# Patient Record
Sex: Male | Born: 1935 | Race: White | Hispanic: No | Smoking: Never smoker
Health system: Southern US, Community
[De-identification: ages and names within clinical notes are randomized; demographics above are authoritative.]

## PROBLEM LIST (undated history)

## (undated) DIAGNOSIS — I509 Heart failure, unspecified: Secondary | ICD-10-CM

## (undated) DIAGNOSIS — G8929 Other chronic pain: Secondary | ICD-10-CM

## (undated) DIAGNOSIS — I4891 Unspecified atrial fibrillation: Secondary | ICD-10-CM

## (undated) DIAGNOSIS — K219 Gastro-esophageal reflux disease without esophagitis: Secondary | ICD-10-CM

## (undated) DIAGNOSIS — J189 Pneumonia, unspecified organism: Secondary | ICD-10-CM

## (undated) DIAGNOSIS — Z9981 Dependence on supplemental oxygen: Secondary | ICD-10-CM

## (undated) DIAGNOSIS — E039 Hypothyroidism, unspecified: Secondary | ICD-10-CM

## (undated) DIAGNOSIS — I351 Nonrheumatic aortic (valve) insufficiency: Secondary | ICD-10-CM

## (undated) DIAGNOSIS — C4490 Unspecified malignant neoplasm of skin, unspecified: Secondary | ICD-10-CM

## (undated) DIAGNOSIS — I447 Left bundle-branch block, unspecified: Secondary | ICD-10-CM

## (undated) DIAGNOSIS — M545 Low back pain, unspecified: Secondary | ICD-10-CM

## (undated) DIAGNOSIS — R0602 Shortness of breath: Secondary | ICD-10-CM

## (undated) DIAGNOSIS — I2510 Atherosclerotic heart disease of native coronary artery without angina pectoris: Secondary | ICD-10-CM

## (undated) DIAGNOSIS — I4510 Unspecified right bundle-branch block: Secondary | ICD-10-CM

## (undated) DIAGNOSIS — J449 Chronic obstructive pulmonary disease, unspecified: Secondary | ICD-10-CM

## (undated) DIAGNOSIS — R918 Other nonspecific abnormal finding of lung field: Secondary | ICD-10-CM

## (undated) DIAGNOSIS — I340 Nonrheumatic mitral (valve) insufficiency: Secondary | ICD-10-CM

## (undated) DIAGNOSIS — E785 Hyperlipidemia, unspecified: Secondary | ICD-10-CM

## (undated) DIAGNOSIS — M1990 Unspecified osteoarthritis, unspecified site: Secondary | ICD-10-CM

## (undated) DIAGNOSIS — M25519 Pain in unspecified shoulder: Secondary | ICD-10-CM

## (undated) DIAGNOSIS — I10 Essential (primary) hypertension: Secondary | ICD-10-CM

## (undated) DIAGNOSIS — I429 Cardiomyopathy, unspecified: Secondary | ICD-10-CM

## (undated) DIAGNOSIS — Z950 Presence of cardiac pacemaker: Secondary | ICD-10-CM

## (undated) HISTORY — DX: Other nonspecific abnormal finding of lung field: R91.8

## (undated) HISTORY — DX: Atherosclerotic heart disease of native coronary artery without angina pectoris: I25.10

## (undated) HISTORY — DX: Shortness of breath: R06.02

## (undated) HISTORY — PX: INSERT / REPLACE / REMOVE PACEMAKER: SUR710

## (undated) HISTORY — PX: THROAT SURGERY: SHX803

## (undated) HISTORY — DX: Nonrheumatic mitral (valve) insufficiency: I34.0

## (undated) HISTORY — PX: CATARACT EXTRACTION W/ INTRAOCULAR LENS  IMPLANT, BILATERAL: SHX1307

## (undated) HISTORY — DX: Chronic obstructive pulmonary disease, unspecified: J44.9

## (undated) HISTORY — PX: TONSILLECTOMY: SUR1361

## (undated) HISTORY — PX: SHOULDER ARTHROSCOPY: SHX128

## (undated) HISTORY — DX: Nonrheumatic aortic (valve) insufficiency: I35.1

## (undated) HISTORY — DX: Hyperlipidemia, unspecified: E78.5

## (undated) HISTORY — DX: Pain in unspecified shoulder: M25.519

## (undated) HISTORY — PX: CORONARY ANGIOPLASTY: SHX604

## (undated) HISTORY — DX: Essential (primary) hypertension: I10

## (undated) HISTORY — PX: KNEE ARTHROSCOPY: SHX127

## (undated) HISTORY — DX: Left bundle-branch block, unspecified: I44.7

## (undated) HISTORY — PX: BACK SURGERY: SHX140

## (undated) HISTORY — PX: LUMBAR DISC SURGERY: SHX700

---

## 1997-11-14 ENCOUNTER — Encounter: Admission: RE | Admit: 1997-11-14 | Discharge: 1998-02-12 | Payer: Self-pay | Admitting: Orthopedic Surgery

## 1998-03-18 ENCOUNTER — Encounter: Admission: RE | Admit: 1998-03-18 | Discharge: 1998-06-16 | Payer: Self-pay | Admitting: Orthopedic Surgery

## 2001-10-13 ENCOUNTER — Encounter: Admission: RE | Admit: 2001-10-13 | Discharge: 2001-10-13 | Payer: Self-pay | Admitting: Orthopaedic Surgery

## 2001-10-13 ENCOUNTER — Encounter: Payer: Self-pay | Admitting: Orthopaedic Surgery

## 2001-10-25 ENCOUNTER — Encounter: Payer: Self-pay | Admitting: Orthopaedic Surgery

## 2001-10-25 ENCOUNTER — Encounter: Admission: RE | Admit: 2001-10-25 | Discharge: 2001-10-25 | Payer: Self-pay | Admitting: Orthopaedic Surgery

## 2001-11-08 ENCOUNTER — Encounter: Payer: Self-pay | Admitting: Orthopaedic Surgery

## 2001-11-08 ENCOUNTER — Encounter: Admission: RE | Admit: 2001-11-08 | Discharge: 2001-11-08 | Payer: Self-pay | Admitting: Orthopaedic Surgery

## 2001-11-22 ENCOUNTER — Encounter: Admission: RE | Admit: 2001-11-22 | Discharge: 2001-11-22 | Payer: Self-pay | Admitting: Orthopaedic Surgery

## 2001-11-22 ENCOUNTER — Encounter: Payer: Self-pay | Admitting: Orthopaedic Surgery

## 2002-08-01 ENCOUNTER — Encounter: Payer: Self-pay | Admitting: Neurosurgery

## 2002-08-05 ENCOUNTER — Encounter: Payer: Self-pay | Admitting: Neurosurgery

## 2002-08-05 ENCOUNTER — Inpatient Hospital Stay (HOSPITAL_COMMUNITY): Admission: RE | Admit: 2002-08-05 | Discharge: 2002-08-06 | Payer: Self-pay | Admitting: Neurosurgery

## 2003-02-11 ENCOUNTER — Emergency Department (HOSPITAL_COMMUNITY): Admission: EM | Admit: 2003-02-11 | Discharge: 2003-02-11 | Payer: Self-pay | Admitting: *Deleted

## 2003-06-10 ENCOUNTER — Encounter: Payer: Self-pay | Admitting: Gastroenterology

## 2003-10-02 ENCOUNTER — Encounter: Admission: RE | Admit: 2003-10-02 | Discharge: 2003-10-23 | Payer: Self-pay | Admitting: Orthopaedic Surgery

## 2003-11-11 ENCOUNTER — Ambulatory Visit (HOSPITAL_COMMUNITY): Admission: RE | Admit: 2003-11-11 | Discharge: 2003-11-11 | Payer: Self-pay | Admitting: Neurosurgery

## 2004-09-21 ENCOUNTER — Encounter: Admission: RE | Admit: 2004-09-21 | Discharge: 2004-09-21 | Payer: Self-pay | Admitting: Internal Medicine

## 2005-03-11 ENCOUNTER — Encounter: Admission: RE | Admit: 2005-03-11 | Discharge: 2005-03-11 | Payer: Self-pay | Admitting: Internal Medicine

## 2005-04-21 ENCOUNTER — Emergency Department (HOSPITAL_COMMUNITY): Admission: EM | Admit: 2005-04-21 | Discharge: 2005-04-21 | Payer: Self-pay | Admitting: Emergency Medicine

## 2007-03-21 ENCOUNTER — Encounter: Admission: RE | Admit: 2007-03-21 | Discharge: 2007-06-19 | Payer: Self-pay | Admitting: Orthopaedic Surgery

## 2008-08-06 ENCOUNTER — Inpatient Hospital Stay (HOSPITAL_COMMUNITY): Admission: EM | Admit: 2008-08-06 | Discharge: 2008-08-08 | Payer: Self-pay | Admitting: Emergency Medicine

## 2008-08-06 ENCOUNTER — Ambulatory Visit: Payer: Self-pay | Admitting: Cardiology

## 2008-08-07 ENCOUNTER — Encounter (INDEPENDENT_AMBULATORY_CARE_PROVIDER_SITE_OTHER): Payer: Self-pay | Admitting: Cardiology

## 2008-08-12 ENCOUNTER — Telehealth: Payer: Self-pay | Admitting: Cardiology

## 2008-08-13 ENCOUNTER — Ambulatory Visit: Payer: Self-pay

## 2008-08-15 ENCOUNTER — Telehealth: Payer: Self-pay | Admitting: Cardiology

## 2008-08-25 ENCOUNTER — Encounter: Payer: Self-pay | Admitting: Cardiology

## 2008-08-25 ENCOUNTER — Ambulatory Visit: Payer: Self-pay | Admitting: Cardiology

## 2008-09-03 ENCOUNTER — Encounter: Payer: Self-pay | Admitting: Cardiology

## 2008-10-16 ENCOUNTER — Ambulatory Visit: Payer: Self-pay | Admitting: Cardiology

## 2008-11-18 ENCOUNTER — Encounter: Payer: Self-pay | Admitting: Cardiology

## 2008-11-18 ENCOUNTER — Encounter: Admission: RE | Admit: 2008-11-18 | Discharge: 2008-11-18 | Payer: Self-pay | Admitting: Internal Medicine

## 2009-02-03 ENCOUNTER — Telehealth: Payer: Self-pay | Admitting: Cardiology

## 2009-02-04 ENCOUNTER — Ambulatory Visit: Payer: Self-pay | Admitting: Cardiology

## 2009-02-09 ENCOUNTER — Telehealth: Payer: Self-pay | Admitting: Cardiology

## 2009-02-10 ENCOUNTER — Ambulatory Visit: Payer: Self-pay | Admitting: Cardiology

## 2009-02-12 LAB — CONVERTED CEMR LAB
CO2: 25 meq/L (ref 19–32)
Calcium: 8.8 mg/dL (ref 8.4–10.5)
Creatinine, Ser: 1.2 mg/dL (ref 0.4–1.5)
Glucose, Bld: 84 mg/dL (ref 70–99)
Sodium: 142 meq/L (ref 135–145)

## 2009-02-13 ENCOUNTER — Encounter: Payer: Self-pay | Admitting: Cardiology

## 2009-02-17 ENCOUNTER — Ambulatory Visit: Payer: Self-pay | Admitting: Cardiology

## 2009-04-20 ENCOUNTER — Ambulatory Visit: Payer: Self-pay | Admitting: Cardiology

## 2009-04-20 LAB — CONVERTED CEMR LAB
BUN: 21 mg/dL (ref 6–23)
CO2: 23 meq/L (ref 19–32)
Chloride: 107 meq/L (ref 96–112)
Glucose, Bld: 135 mg/dL — ABNORMAL HIGH (ref 70–99)

## 2009-05-05 ENCOUNTER — Ambulatory Visit: Payer: Self-pay | Admitting: Cardiology

## 2009-05-08 LAB — CONVERTED CEMR LAB
BUN: 24 mg/dL — ABNORMAL HIGH (ref 6–23)
Chloride: 110 meq/L (ref 96–112)
GFR calc non Af Amer: 55.12 mL/min (ref >60)
Potassium: 5 meq/L (ref 3.5–5.1)
Sodium: 143 meq/L (ref 135–145)

## 2009-05-11 ENCOUNTER — Inpatient Hospital Stay (HOSPITAL_COMMUNITY): Admission: EM | Admit: 2009-05-11 | Discharge: 2009-05-15 | Payer: Self-pay | Admitting: Emergency Medicine

## 2009-06-12 ENCOUNTER — Telehealth: Payer: Self-pay | Admitting: Gastroenterology

## 2009-06-12 ENCOUNTER — Encounter: Payer: Self-pay | Admitting: Gastroenterology

## 2009-06-15 ENCOUNTER — Ambulatory Visit: Payer: Self-pay | Admitting: Gastroenterology

## 2009-06-16 ENCOUNTER — Ambulatory Visit: Payer: Self-pay | Admitting: Gastroenterology

## 2009-06-17 ENCOUNTER — Telehealth: Payer: Self-pay | Admitting: Gastroenterology

## 2009-06-18 ENCOUNTER — Encounter: Admission: RE | Admit: 2009-06-18 | Discharge: 2009-06-18 | Payer: Self-pay | Admitting: Internal Medicine

## 2009-06-19 ENCOUNTER — Encounter: Payer: Self-pay | Admitting: Gastroenterology

## 2009-06-26 ENCOUNTER — Ambulatory Visit: Payer: Self-pay | Admitting: Pulmonary Disease

## 2009-06-29 ENCOUNTER — Encounter: Payer: Self-pay | Admitting: Cardiology

## 2009-06-30 ENCOUNTER — Ambulatory Visit: Payer: Self-pay | Admitting: Cardiology

## 2009-07-06 LAB — CONVERTED CEMR LAB
Fecal Occult Blood: NEGATIVE
OCCULT 1: NEGATIVE
OCCULT 4: NEGATIVE
OCCULT 5: NEGATIVE

## 2009-10-01 ENCOUNTER — Encounter: Payer: Self-pay | Admitting: Cardiology

## 2009-10-02 ENCOUNTER — Inpatient Hospital Stay (HOSPITAL_COMMUNITY): Admission: EM | Admit: 2009-10-02 | Discharge: 2009-10-05 | Payer: Self-pay | Admitting: Emergency Medicine

## 2009-10-12 ENCOUNTER — Ambulatory Visit: Payer: Self-pay | Admitting: Cardiology

## 2009-10-12 ENCOUNTER — Telehealth: Payer: Self-pay | Admitting: Cardiology

## 2009-10-12 LAB — CONVERTED CEMR LAB
BUN: 24 mg/dL — ABNORMAL HIGH (ref 6–23)
Basophils Absolute: 0 10*3/uL (ref 0.0–0.1)
Chloride: 110 meq/L (ref 96–112)
Creatinine, Ser: 1.5 mg/dL (ref 0.4–1.5)
Eosinophils Absolute: 0.1 10*3/uL (ref 0.0–0.7)
Eosinophils Relative: 1.3 % (ref 0.0–5.0)
MCHC: 34.3 g/dL (ref 30.0–36.0)
MCV: 93.7 fL (ref 78.0–100.0)
Neutrophils Relative %: 58 % (ref 43.0–77.0)
Platelets: 472 10*3/uL — ABNORMAL HIGH (ref 150.0–400.0)
WBC: 9.1 10*3/uL (ref 4.5–10.5)

## 2009-10-14 ENCOUNTER — Ambulatory Visit: Payer: Self-pay | Admitting: Cardiology

## 2009-11-10 ENCOUNTER — Encounter: Admission: RE | Admit: 2009-11-10 | Discharge: 2009-11-10 | Payer: Self-pay | Admitting: Internal Medicine

## 2010-03-18 ENCOUNTER — Ambulatory Visit: Payer: Self-pay | Admitting: Cardiology

## 2010-05-11 ENCOUNTER — Telehealth: Payer: Self-pay | Admitting: Cardiology

## 2010-05-12 ENCOUNTER — Telehealth (INDEPENDENT_AMBULATORY_CARE_PROVIDER_SITE_OTHER): Payer: Self-pay | Admitting: *Deleted

## 2010-05-20 NOTE — Progress Notes (Signed)
Summary: swollen feet/SOB  Phone Note Call from Patient Call back at [phone]   Caller: Daughter/Cynthia Summary of Call: both feet are swollen, sob, just dc from hospital Initial call taken by: Migdalia Dk,  October 12, 2009 9:28 AM  Follow-up for Phone Call        spoke w/Cynthia she states pts feet are swollen bilaterally from ankle down, right is bigger than the left, he feels like he can't take a good deep breathe, he was unable to lay flat last night and had to sleep in his recliner, pt was just d/c from hospital last week w/pna and a gi virus, he is on furosemide 40mg  daily, will discuss w/Dr Myrtis Ser and call back Meredith Staggers, RN  October 12, 2009 10:11 AM   Additional Follow-up for Phone Call Additional follow up Details #1::        discussed w/Dr Myrtis Ser will bring pt in for an appt today, pt sch for 11am Meredith Staggers, RN  October 12, 2009 10:25 AM

## 2010-05-20 NOTE — Letter (Signed)
Summary: Nila Nephew  MD  Nila Nephew  MD   Imported By: Lester Bellwood 06/17/2009 11:26:26  _____________________________________________________________________  External Attachment:    Type:   Image     Comment:   External Document

## 2010-05-20 NOTE — Letter (Signed)
Summary: Results Letter  [facility]  [address] [place], [place] [address]   Phone: [phone]  Fax: [phone]        June 15, 2009 MRN: [id number]    NIVAAN DICENZO [address] [place], [place]  [address]    Dear Mr. Delaine,  It is my pleasure to have treated you recently as a new patient in my office. I appreciate your confidence and the opportunity to participate in your care.  Since I do have a busy inpatient endoscopy schedule and office schedule, my office hours vary weekly. I am, however, available for emergency calls everyday through my office. If I am not available for an urgent office appointment, another one of our gastroenterologist will be able to assist you.  My well-trained staff are prepared to help you at all times. For emergencies after office hours, a physician from our Gastroenterology section is always available through my 24 hour answering service  Once again I welcome you as a new patient and I look forward to a happy and healthy relationship             Sincerely,  Louis Meckel MD  This letter has been electronically signed by your physician.  Appended Document: Results Letter letter mailed

## 2010-05-20 NOTE — Assessment & Plan Note (Signed)
Summary: 8wk f/u sl   Visit Type:  Follow-up Referring Provider:  Elmore Guise, MD Primary Provider:  Elmore Guise, MD  CC:  palpitations.  History of Present Illness: The patient is feeling well today.  After telling me of his hospital admission with pneumonia, I have pulled the records and reviewed the data.  He was admitted with cough and left-sided chest pain.  Chest x-ray showed no acute findings.  Nuclear scan revealed low probability for pulmonary embolus.  CT Scan of the abdomen and pelvis actually showed dense lower lobe air space disease consistent with pneumonia.  A followup chest x-ray revealed a pneumonia area he was treated with IV and antibiotics.  He stabilized and was discharged home.  Today he is feeling well.  He is not having any chest pain or shortness of breath.  He did not have any palpitations.  Current Medications (verified): 1)  Toprol Xl 50 Mg Xr24h-Tab (Metoprolol Succinate) .... Take One Tablet By Mouth Once Daily. 2)  Tricor 48 Mg Tabs (Fenofibrate) .... Take One-Half Tablet By Mouth At Bedtime 3)  Aspirin 81 Mg  Tabs (Aspirin) .... Take One Tablet By Mouth Once Daily. 4)  Ocuvite Preservision  Tabs (Multiple Vitamins-Minerals) .... Take One Tablet By Mouth Two Times A Day 5)  Omeprazole 20 Mg Cpdr (Omeprazole) .... Take One Tablet By Mouth Once Daily. 6)  Centrum Silver  Tabs (Multiple Vitamins-Minerals) .... Take One Tablet By Mouth Once Daily. 7)  Stool Softener 100 Mg Caps (Docusate Sodium) .Marland Kitchen.. 1 Daily As Needed 8)  Fish Oil 1000 Mg Caps (Omega-3 Fatty Acids) .... Take Two By Mouth Once Daily 9)  Furosemide 40 Mg Tabs (Furosemide) .... Take One Tablet By Mouth Daily.  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 August, 2010 COPD Nuclear stress study August 08, 2008 possible small reversible ischemic area in the mid and apical segment of the anteroseptal wall Hypertension Gout Elevated triglycerides Palpitations..Discharge weight presumed to be paroxysmal atrial fibrillation but no definite documentation. Systolic murmur Smothering sensation-hospitalization August 06, 2008-question mild volume component-question bradycardia tachycardia component EP evaluation-Dr. Allred....-assess status with outpatient event recorder-April 2010-no proven chronotropic incompetence  Mild MR ..  echo.Marland Kitchen.07/2008 Mild AI.Marland Kitchen..echo.Marland Kitchen.07/2008 Pneumonia... hospitalization.... May 11, 2009  Review of Systems       Patient denies fever, chills, headache, sweats, rash, change in vision, change in hearing, chest pain, shortness of breath, nausea or vomiting, urinary symptoms.  All other systems are reviewed and are negative.  Vital Signs:  Patient profile:   77 year old male Height:      71 inches Weight:      183 pounds Pulse rate:   85 / minute BP sitting:   122 / 66  (left arm) Cuff size:   regular  Vitals Entered By: Hardin Negus, RMA (June 30, 2009 9:32 AM)  Physical Exam  General:  patient is stable today. Eyes:  no xanthelasma. Neck:  no jugular venous distention. Lungs:  lungs are clear respiratory effort is nonlabored. Heart:  cardiac exam reveals no dullness to peritoneal clicks or significant murmurs. Abdomen:  abdomen is soft. Extremities:  no peripheral edema. Psych:  patient is oriented to person time and place.  Affect is normal.   Impression & Recommendations:  Problem #  1:  SHORTNESS OF BREATH (ICD-786.05) patient has no shortness of breath at this time.  He is recovered completely from his pneumonia.  Problem # 2:  PALPITATIONS (ICD-785.1) patient is not having any significant palpitations.  No change in therapy.  Problem # 3:  ESSENTIAL HYPERTENSION, BENIGN  (ICD-401.1) Blood pressure is controlled.  No change in therapy.  Problem # 4:  * EJECTION FRACTION 45-50% No change in therapy is needed.  I will see the patient back in one year for followup.  Patient Instructions: 1)  Follow up in 1 year Prescriptions: FUROSEMIDE 40 MG TABS (FUROSEMIDE) Take one tablet by mouth daily.  #90 x 3   Entered by:   Meredith Staggers, RN   Authorized by:   Talitha Givens, MD, [facility]   Signed by:   Meredith Staggers, RN on 06/30/2009   Method used:   [facility] to        [facility] [address] [phone]* (retail)       [address]       [place], [place]  [address]       Ph: [phone] or [phone]       Fax: [phone]   RxID:   [id number] TOPROL XL 50 MG XR24H-TAB (METOPROLOL SUCCINATE) Take one tablet by mouth once daily.  #90 x 3   Entered by:   Meredith Staggers, RN   Authorized by:   Talitha Givens, MD, [facility]   Signed by:   Meredith Staggers, RN on 06/30/2009   Method used:   [facility]        [facility] [address] [phone]* (retail)       [address]       [place], [place]  [address]       Ph: [phone] or [phone]       Fax: [phone]   RxID:   [id number]

## 2010-05-20 NOTE — Miscellaneous (Signed)
  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 August, 2010 COPD Nuclear stress study August 08, 2008 possible small reversible ischemic area in the mid and apical segment of the anteroseptal wall Hypertension Gout Elevated triglycerides Palpitations..Discharge weight presumed to be paroxysmal atrial fibrillation but no definite documentation. Systolic murmur Smothering sensation-hospitalization August 06, 2008-question mild volume component-question bradycardia tachycardia component EP evaluation-Dr. Allred....-assess status with outpatient event recorder-April 2010-no proven chronotropic incompetence  Mild MR ..  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 August, 2010 COPD Nuclear stress study August 08, 2008 possible small reversible ischemic area in the mid and apical segment of the anteroseptal wall Hypertension Gout Elevated triglycerides Palpitations..Discharge weight presumed to be paroxysmal atrial fibrillation but no definite documentation. Systolic murmur Smothering sensation-hospitalization August 06, 2008-question mild volume component-question bradycardia tachycardia component EP evaluation-Dr. Allred....-assess status with outpatient event recorder-April 2010-no proven chronotropic incompetence  Mild MR ..  echo.Marland Kitchen.07/2008 Mild AI.Marland Kitchen..echo.Marland Kitchen.07/2008

## 2010-05-20 NOTE — Assessment & Plan Note (Signed)
Summary: HEM. POSITIVE STOOLS                   DEBORAH   History of Present Illness Visit Type: Initial Consult Primary GI MD: Melvia Heaps MD [facility] Primary Provider: Elmore Guise, MD Requesting Provider: Elmore Guise, MD Chief Complaint: Patient complains of black tarry stools for about 10 days, he also states that he has had some diarrhea. Dr. Amanda Cockayne office found the patient t to be hem pos.  History of Present Illness:   Levi Lewis is a pleasant 77 year old white male referred at the  request of  Dr. Chilton Si for evaluation of Hemoccult-positive stools.  He noted several black soft stools recently and tested Hemoccult positive at Dr. Thomasene Lot office.  He has noted some change in his bowel habits consisting of urgency.  He has no frank rectal bleeding.  He is on no gastric irritants including nonsteroidals.   GI Review of Systems      Denies abdominal pain, acid reflux, belching, bloating, chest pain, dysphagia with liquids, dysphagia with solids, heartburn, loss of appetite, nausea, vomiting, vomiting blood, weight loss, and  weight gain.      Reports black tarry stools, diarrhea, heme positive stool, and  hemorrhoids.     Denies anal fissure, change in bowel habit, constipation, diverticulosis, fecal incontinence, irritable bowel syndrome, jaundice, light color stool, liver problems, rectal bleeding, and  rectal pain. Preventive Screening-Counseling & Management      Drug Use:  no.      Current Medications (verified): 1)  Toprol Xl 50 Mg Xr24h-Tab (Metoprolol Succinate) .... Take One Tablet By Mouth Once Daily. 2)  Tricor 48 Mg Tabs (Fenofibrate) .... Take One-Half Tablet By Mouth At Bedtime 3)  Aspirin 81 Mg  Tabs (Aspirin) .... Take One Tablet By Mouth Once Daily. 4)  Ocuvite Preservision  Tabs (Multiple Vitamins-Minerals) .... Take One Tablet By Mouth Two Times A Day 5)  Omeprazole 20 Mg Cpdr (Omeprazole) .... Take One Tablet By Mouth Once Daily. 6)  Centrum Silver  Tabs (Multiple  Vitamins-Minerals) .... Take One Tablet By Mouth Once Daily. 7)  Stool Softener 100 Mg Caps (Docusate Sodium) .Marland Kitchen.. 1 Daily As Needed 8)  Fish Oil 1000 Mg Caps (Omega-3 Fatty Acids) .... Take Two By Mouth Once Daily 9)  Furosemide 40 Mg Tabs (Furosemide) .... Take One Tablet By Mouth Daily.  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 August, 2010 COPD Nuclear stress study August 08, 2008 possible small reversible ischemic area in the mid and apical segment of the anteroseptal wall Hypertension Gout Elevated triglycerides Palpitations..Discharge weight presumed to be paroxysmal atrial fibrillation but no definite documentation. Systolic murmur Smothering sensation-hospitalization August 06, 2008-question mild volume component-question bradycardia tachycardia component EP evaluation-Dr. Rush Landmark..-assess status with outpatient event recorder-April 2010-no proven chronotropic incompetence   Past Surgical History: back surgery right shoulder surgery right knee surgery Tonsillectomy  Family History:  Sister passed away due to complications of a myocardial   infarction in her 61s.  Family History of Lung Cancer: Brother (smoker)   No FH of Colon Cancer:  Social History: He has never smoked or drank.  No illicit drug use.  He   is retired from the [employer] and lives with his wife.  Illicit Drug Use - no Drug Use:  no  Review of Systems  The patient denies allergy/sinus, anemia, anxiety-new, arthritis/joint pain, back pain, blood in urine, breast changes/lumps, change in vision, confusion, cough, coughing up blood, depression-new, fainting, fatigue, fever, headaches-new, hearing problems, heart murmur, heart  rhythm changes, itching, menstrual pain, muscle pains/cramps, night sweats, nosebleeds, pregnancy symptoms, shortness of breath, skin rash, sleeping problems, sore throat, swelling of feet/legs, swollen lymph glands, thirst - excessive , urination - excessive , urination changes/pain, urine leakage, vision changes, and voice change.    Vital Signs:  Patient profile:   77 year old male Height:      71 inches Weight:      185.4 pounds Pulse rate:   74 / minute Pulse rhythm:   irregularly irregular BP sitting:   124 / 62  (left arm) Cuff size:   regular  Vitals Entered By: Harlow Mares CMA [name]) (June 15, 2009 9:39 AM)  Physical Exam  Additional Exam:  On physical exam he is an elderly male in no acute distress  skin: anicteric HEENT: normocephalic; PEERLA; no nasal or pharyngeal abnormalities neck: supple nodes: no cervical lymphadenopathy chest: clear to ausculatation and percussion heart: no murmurs, gallops, or rubs abd: soft, nontender; BS normoactive; no abdominal masses, tenderness, organomegaly rectal: deferred ext: no cynanosis, clubbing, edema skeletal: no deformities neuro: oriented x 3; no focal abnormalities    Impression & Recommendations:  Problem # 1:  FECAL OCCULT BLOOD (ICD-792.1)  Patient has evidence for GI blood loss.  A colonic lesion including polyps, AVMs and neoplasm are considerations.  Upper GI source must also be considered though the patient is taking omeprazole.  Recommendations #1 colonoscopy #2 upper endoscopy if colonoscopy is not diagnostic for a GI bleeding source  Risks, alternatives, and complications of the procedure, including bleeding, perforation, and possible need for surgery, were explained to the patient.  Patient's questions were answered.  Orders: Colonoscopy (Colon)  Problem # 2:  ESSENTIAL HYPERTENSION, BENIGN (ICD-401.1) Assessment: Comment Only  Problem # 3:  LBBB (ICD-426.3) Assessment: Comment Only  Patient  Instructions: 1)  Colonoscopy and Flexible Sigmoidoscopy brochure given.  2)  Conscious Sedation brochure given.  3)  Your Colonoscopy is scheduled for 06/16/2009 at 3pm 4)  You can pick up your MoviPrep from your pharmacy today 5)  cc Dr. Chilton Si 6)  The medication list was reviewed and reconciled.  All changed / newly prescribed medications were explained.  A complete medication list was provided to the patient / caregiver. Prescriptions: MOVIPREP 100 GM  SOLR (PEG-KCL-NACL-NASULF-NA ASC-C) As per prep instructions.  #1 x 0   Entered by:   Merri Ray CMA (AAMA)   Authorized by:   Louis Meckel MD   Signed by:   Merri Ray CMA (AAMA) on 06/15/2009   Method used:   [facility]        [facility] [address] [phone]* (retail)       [address]       [place], [place]  [address]       Ph: [phone] or [phone]       Fax: [phone]   RxID:   [id number]

## 2010-05-20 NOTE — Progress Notes (Signed)
Summary: speak to nurse  Phone Note Call from Patient Call back at [phone]   Caller: Daughter in law Aram Beecham Call For: Arlyce Dice Reason for Call: Talk to Nurse Summary of Call: Daughter in law states that the patient was suppose to get a list of high fiber diet and she also states that he needed to get suppositories but she does not know what kind or which one to get. Initial call taken by: Tawni Levy,  June 17, 2009 9:07 AM  Follow-up for Phone Call        Attempted to return pts phone call with no answer.  Will try again later. Follow-up by: Jennye Boroughs RN,  June 17, 2009 10:42 AM  Additional Follow-up for Phone Call Additional follow up Details #1::        Spoke with daughter- she feels that the high fiber diet was left at our facility by the pt's wife-will mail them a copy- the suppositories that she questiopns was OTC and anusol sounded familiar. Caller was very appreciative of the information. Additional Follow-up by: Jennye Boroughs RN,  June 17, 2009 11:01 AM

## 2010-05-20 NOTE — Letter (Signed)
Summary: Dr. Elveria Rising Office  Dr. Josefina Do Green's Office   Imported By: Marylou Mccoy 11/18/2009 14:59:26  _____________________________________________________________________  External Attachment:    Type:   Image     Comment:   External Document

## 2010-05-20 NOTE — Letter (Signed)
Summary: [facility] Instructions  [facility]  [address] [place], [place] [address]   Phone: [phone]  Fax: [phone]       Levi Lewis    1917/07/04    MRN: [id number]        Procedure Day /Date:TUESDAY 06/16/2009     Arrival Time:2:00PM     Procedure Time:3:00PM     Location of Procedure:                    X   [facility] (4th Floor)   PREPARATION FOR COLONOSCOPY WITH MOVIPREP   Starting 5 days prior to your procedure TODAY do not eat nuts, seeds, popcorn, corn, beans, peas,  salads, or any raw vegetables.  Do not take any fiber supplements (e.g. Metamucil, Citrucel, and Benefiber).  THE DAY BEFORE YOUR PROCEDURE         DATE:06/15/2009  DAY: MONDAY  1.  Drink clear liquids the entire day-NO SOLID FOOD  2.  Do not drink anything colored red or purple.  Avoid juices with pulp.  No orange juice.  3.  Drink at least 64 oz. (8 glasses) of fluid/clear liquids during the day to prevent dehydration and help the prep work efficiently.  CLEAR LIQUIDS INCLUDE: Water Jello Ice Popsicles Tea (sugar ok, no milk/cream) Powdered fruit flavored drinks Coffee (sugar ok, no milk/cream) Gatorade Juice: apple, white grape, white cranberry  Lemonade Clear bullion, consomm, broth Carbonated beverages (any kind) Strained chicken noodle soup Hard Candy                             4.  In the morning, mix first dose of MoviPrep solution:    Empty 1 Pouch A and 1 Pouch B into the disposable container    Add lukewarm drinking water to the top line of the container. Mix to dissolve    Refrigerate (mixed solution should be used within 24 hrs)  5.  Begin drinking the prep at 5:00 p.m. The MoviPrep container is divided by 4 marks.   Every 15 minutes drink the solution down to the next mark (approximately 8 oz) until the full liter is complete.   6.  Follow completed prep with 16 oz of clear liquid of your choice (Nothing red or purple).  Continue to  drink clear liquids until bedtime.  7.  Before going to bed, mix second dose of MoviPrep solution:    Empty 1 Pouch A and 1 Pouch B into the disposable container    Add lukewarm drinking water to the top line of the container. Mix to dissolve    Refrigerate  THE DAY OF YOUR PROCEDURE      DATE: 06/16/2009 DAY: TUESDAY  Beginning at10a.m. (5 hours before procedure):         1. Every 15 minutes, drink the solution down to the next mark (approx 8 oz) until the full liter is complete.  2. Follow completed prep with 16 oz. of clear liquid of your choice.    3. You may drink clear liquids until 1:00PM(2 HOURS BEFORE PROCEDURE).   MEDICATION INSTRUCTIONS  Unless otherwise instructed, you should take regular prescription medications with a small sip of water   as early as possible the morning of your procedure.        OTHER INSTRUCTIONS  You will need a responsible adult at least 75 years of age to accompany you and drive you home.  This person must remain in the waiting room during your procedure.  Wear loose fitting clothing that is easily removed.  Leave jewelry and other valuables at home.  However, you may wish to bring a book to read or  an iPod/MP3 player to listen to music as you wait for your procedure to start.  Remove all body piercing jewelry and leave at home.  Total time from sign-in until discharge is approximately 2-3 hours.  You should go home directly after your procedure and rest.  You can resume normal activities the  day after your procedure.  The day of your procedure you should not:   Drive   Make legal decisions   Operate machinery   Drink alcohol   Return to work  You will receive specific instructions about eating, activities and medications before you leave.    The above instructions have been reviewed and explained to me by   _______________________    I fully understand and can verbalize these instructions  _____________________________ Date _________

## 2010-05-20 NOTE — Progress Notes (Signed)
  Walk in Patient Form Recieved " Pt need Surgical Clearance paper left from Sports Medicine" sent to Rusty [facility]  May 12, 2010 1:41 PM

## 2010-05-20 NOTE — Procedures (Signed)
Summary: Colonoscopy  Patient: Levi Lewis Note: All result statuses are Final unless otherwise noted.  Tests: (1) Colonoscopy (COL)   COL Colonoscopy           DONE     [facility]     [address]     [place], [place]  [address]           COLONOSCOPY PROCEDURE REPORT           PATIENT:  Levi Lewis, Levi Lewis  MR#:  [id number]     BIRTHDATE:  12/31/1917, 89 yrs. old  GENDER:  male           ENDOSCOPIST:  Barbette Hair. Arlyce Dice, MD     Referred by:           PROCEDURE DATE:  06/16/2009     PROCEDURE:  Colonoscopy with snare polypectomy     ASA CLASS:  Class II     INDICATIONS:  heme positive stool           MEDICATIONS:   Fentanyl 50 mcg IV, Versed 4 mg IV           DESCRIPTION OF PROCEDURE:   After the risks benefits and     alternatives of the procedure were thoroughly explained, informed     consent was obtained.  Digital rectal exam was performed and     revealed no abnormalities.   The LB [id number] endoscope     was introduced through the anus and advanced to the cecum, which     was identified by both the appendix and ileocecal valve, without     limitations.  The quality of the prep was excellent, using     MoviPrep.  The instrument was then slowly withdrawn as the colon     was fully examined.     <<PROCEDUREIMAGES>>           FINDINGS:  A pedunculated polyp was found in the sigmoid colon. It     was 1.5 cm in size. It was found 25 cm from the point of entry.     Friable polyp Polyp was snared, then cauterized with monopolar     cautery. Retrieval was successful (see image15). snare polyp     Moderate diverticulosis was found in the sigmoid colon (see image14     and image13).  This was otherwise a normal examination of the     colon (see image2, image3, image4, image5, image6, image7,     image10, and image17).   Retroflexed views in the rectum revealed     no abnormalities.    The scope was then withdrawn from the patient     and the procedure completed.         COMPLICATIONS:  None           ENDOSCOPIC IMPRESSION:     1) 1.5 cm pedunculated polyp in the sigmoid colon - probable     source for heme positive stool     2) Moderate diverticulosis in the sigmoid colon     3) Otherwise normal examination     RECOMMENDATIONS:     1) followup hemeoccults in 1 week; if negative no further GI w/u                 REPEAT EXAM:  No, in view of patient's age           ______________________________     Barbette Hair. Arlyce Dice, MD  CC:  Nila Nephew, MD           n.     Rosalie DoctorBarbette Hair. Kaplan at 06/16/2009 03:56 PM           Lubertha Sayres, [id number]  Note: An exclamation mark (!) indicates a result that was not dispersed into the flowsheet. Document Creation Date: 06/16/2009 3:56 PM _______________________________________________________________________  (1) Order result status: Final Collection or observation date-time: 06/16/2009 15:44 Requested date-time:  Receipt date-time:  Reported date-time:  Referring Physician:   Ordering Physician: Melvia Heaps [phone]) Specimen Source:  Source: Launa Grill Order Number: [id number] Lab site:

## 2010-05-20 NOTE — Assessment & Plan Note (Signed)
Summary: 2 month rov/sl   Visit Type:  Follow-up Primary Provider:  Elmore Guise, MD  CC:  palpitations.  History of Present Illness: The patient is actually doing quite well.  He mentions some palpitations.  I do not think that he is having any prolonged arrhythmias.  With careful questioning he does have some exertional shortness of breath.  In addition he may have some PND.  Is not having any significant edema.  There is no chest pain.  Current Medications (verified): 1)  Toprol Xl 50 Mg Xr24h-Tab (Metoprolol Succinate) .... Take One Tablet By Mouth Once Daily. 2)  Tricor 48 Mg Tabs (Fenofibrate) .... Take One-Half Tablet By Mouth At Bedtime 3)  Aspirin 81 Mg  Tabs (Aspirin) .... Take One Tablet By Mouth Once Daily. 4)  Ocuvite Preservision  Tabs (Multiple Vitamins-Minerals) .... Take One Tablet By Mouth Two Times A Day 5)  Omeprazole 20 Mg Cpdr (Omeprazole) .... Take One Tablet By Mouth Once Daily. 6)  Centrum Silver  Tabs (Multiple Vitamins-Minerals) .... Take One Tablet By Mouth Once Daily. 7)  Stool Softener 100 Mg Caps (Docusate Sodium) .Marland Kitchen.. 1 Daily As Needed 8)  Fish Oil   Oil (Fish Oil) .... 1000mg  2 Daily 9)  Lasix 20 Mg Tabs (Furosemide) .... Take One Tablet By Mouth Daily 10)  K-Tabs 10 Meq Cr-Tabs (Potassium Chloride) .... Take One Table By Mouth Daily  Allergies (verified): No Known Drug Allergies  Past History:  Past Medical History: Last updated: 02/10/2009 Left bundle branch block EF 45-50% septal dyssynergy related to LBBB Coronary artery calcification by CT scan Nodular lung opacities chest CT August 06, 2008 / follow up CT scan showed  opacities smaller.. August, 2010 COPD Nuclear stress study August 08, 2008 possible small reversible ischemic area in the mid and apical segment of the anteroseptal wall Hypertension Gout Elevated triglycerides Palpitations..Discharge weight presumed to be paroxysmal atrial fibrillation but no definite documentation. Systolic  murmur Smothering sensation-hospitalization August 06, 2008-question mild volume component-question bradycardia tachycardia component EP evaluation-Dr. Rush Landmark..-assess status with outpatient event recorder-April 2010-no proven chronotropic incompetence   Review of Systems       Patient denies fever, chills, sweats, rash.  He does have mild headaches on the left side of his head which are limited.  He has no change in vision or hearing.  There is no chest pain or cough.  He has no urinary or GI symptoms.  All other systems are reviewed and are negative.  Vital Signs:  Patient profile:   77 year old male Height:      71 inches Weight:      185 pounds Pulse rate:   87 / minute BP sitting:   156 / 79  (left arm)  Vitals Entered By: Oswald Hillock (April 20, 2009 8:49 AM)  Physical Exam  General:  patient is stable today. Head:  head is atraumatic. Eyes:  no xanthelasma. Neck:  no jugular venous distention.  No carotid bruits. Chest Wall:  no chest wall tenderness. Lungs:  lungs reveal a few scattered rhonchi.  There is no respiratory distress. Heart:  cardiac exam reveals S1 and S2.  There is a soft systolic murmur. Abdomen:  abdomen is soft. Msk:  no musculoskeletal deformities. Extremities:  trace peripheral edema. Skin:  no skin rashes. Psych:  patient is oriented to person time and place.  Affect is normal.   Impression & Recommendations:  Problem # 1:  * SMOTHERING SENSATION The patient does not describe a smothering sensation  today, but with careful questioning I believe that he does have some shortness of breath and possibly some PND.  I suspect this is more of his symptomatology than the actual palpitations.  Chemistry will be checked.  Lasix will be increased from 20-40 mg daily.  All see him back for early followup.  Problem # 2:  PALPITATIONS (ICD-785.1)  The patient does complain of some palpitations.  From the prior notes and today's review I am not inclined  to place a monitor.  We will see how he does first by controlling his fluid status a little better.  Orders: EKG w/ Interpretation (93000)  Problem # 3:  ESSENTIAL HYPERTENSION, BENIGN (ICD-401.1) Blood pressure is slightly high and I would like to see today.  I am changing only his diuretic dose today.  Other Orders: TLB-BMP (Basic Metabolic Panel-BMET) (80048-METABOL)  Patient Instructions: 1)  Increase Furosemide to 40mg  daily 2)  Labs today 3)  Follow up in 2 weeks--Tue Jan 18 at 10:30am Prescriptions: FUROSEMIDE 40 MG TABS (FUROSEMIDE) Take one tablet by mouth daily.  #90 x 3   Entered by:   Meredith Staggers, RN   Authorized by:   Talitha Givens, MD, [facility]   Signed by:   Meredith Staggers, RN on 04/20/2009   Method used:   Electronically to        [facility]  [phone]* (retail)       [address]       [place], [place]  [address]       Ph: [phone] or [phone]       Fax: [phone]   RxID:   [id number]

## 2010-05-20 NOTE — Procedures (Signed)
Summary: COLONOSCOPY    Patient Name: Cobi, Delph MRN:  Procedure Procedures: Colorectal cancer screening, average risk CPT: G0121.  Personnel: Endoscopist: Barbette Hair. Arlyce Dice, MD.  Indications  Average Risk Screening Routine.  History  Current Medications: Patient is not currently taking Coumadin.  Pre-Exam Physical: Performed Jun 10, 2003. Entire physical exam was normal.  Exam Exam: Extent of exam reached: Cecum, extent intended: Cecum.  The cecum was identified by IC valve. Colon retroflexion performed. ASA Classification: II. Tolerance: good.  Monitoring: Pulse and BP monitoring, Oximetry used. Supplemental O2 given. at 2 Liters.  Colon Prep Used Miralax for colon prep. Prep results: good.  Sedation Meds: Fentanyl 125 mcg. given IV. Versed 12 given IV.  Findings - DIVERTICULOSIS: Descending Colon to Sigmoid Colon. ICD9: Diverticulosis: 562.10. Comments: Diffuse diverticular changes.  NORMAL EXAM: Cecum.  NORMAL EXAM: Rectum.   Assessment Abnormal examination, see findings above.  Diagnoses: 562.10: Diverticulosis.   Events  Unplanned Interventions: No intervention was required.  Unplanned Events: There were no complications. Plans Patient Education: Patient given standard instructions for: Diverticulosis.  Scheduling/Referral: Follow-Up prn.    CC: Dorma Russell J.Green,MD  This report was created from the original endoscopy report, which was reviewed and signed by the above listed endoscopist.

## 2010-05-20 NOTE — Assessment & Plan Note (Signed)
Summary: Levi Lewis   Visit Type:  Follow-up Primary Provider:  Elmore Guise, MD  CC:  palpitations.  History of Present Illness: The patient is seen today for followup of his palpitations and volume status.  At the time of his last visit his potassium is 3.9 and his renal function was stable with creatinine 1.2.  At that time his potassium was continued at 10 mEq daily and Lasix was increased from 20-40 mg daily.  I believe that his smothering sensations have decreased somewhat.  I believe he is probably having less PND.  Palpitations appear to be the same.  We know from prior studies that he has not had any significant ventricular arrhythmias.  He is not having any significant chest pain.  Current Medications (verified): 1)  Toprol Xl 50 Mg Xr24h-Tab (Metoprolol Succinate) .... Take One Tablet By Mouth Once Daily. 2)  Tricor 48 Mg Tabs (Fenofibrate) .... Take One-Half Tablet By Mouth At Bedtime 3)  Aspirin 81 Mg  Tabs (Aspirin) .... Take One Tablet By Mouth Once Daily. 4)  Ocuvite Preservision  Tabs (Multiple Vitamins-Minerals) .... Take One Tablet By Mouth Two Times A Day 5)  Omeprazole 20 Mg Cpdr (Omeprazole) .... Take One Tablet By Mouth Once Daily. 6)  Centrum Silver  Tabs (Multiple Vitamins-Minerals) .... Take One Tablet By Mouth Once Daily. 7)  Stool Softener 100 Mg Caps (Docusate Sodium) .Marland Kitchen.. 1 Daily As Needed 8)  Fish Oil   Oil (Fish Oil) .... 1000mg  2 Daily 9)  Furosemide 40 Mg Tabs (Furosemide) .... Take One Tablet By Mouth Daily. 10)  K-Tabs 10 Meq Cr-Tabs (Potassium Chloride) .... Take One Table By Mouth Daily  Allergies (verified): No Known Drug Allergies  Past History:  Past Medical History: Last updated: 02/10/2009 Left bundle branch block EF 45-50% septal dyssynergy related to LBBB Coronary artery calcification by CT scan Nodular lung opacities chest CT August 06, 2008 / follow up CT scan showed  opacities smaller.. August, 2010 COPD Nuclear stress study August 08, 2008  possible small reversible ischemic area in the mid and apical segment of the anteroseptal wall Hypertension Gout Elevated triglycerides Palpitations..Discharge weight presumed to be paroxysmal atrial fibrillation but no definite documentation. Systolic murmur Smothering sensation-hospitalization August 06, 2008-question mild volume component-question bradycardia tachycardia component EP evaluation-Dr. Rush Landmark..-assess status with outpatient event recorder-April 2010-no proven chronotropic incompetence   Review of Systems       Patient denies fever, chills, headache, sweats, rash, change in vision, change in hearing, chest pain, cough, nausea vomiting, urinary symptoms.  All other systems are reviewed and are negative.  Vital Signs:  Patient profile:   77 year old male Height:      71 inches Weight:      185 pounds Pulse rate:   75 / minute BP sitting:   120 / 62  (left arm) Cuff size:   regular  Vitals Entered By: Hardin Negus, RMA (May 05, 2009 10:08 AM)  Physical Exam  General:  patient is stable in general. Eyes:  no xanthelasma. Neck:  no jugular venous distention. Lungs:  lungs are clear.  Respiratory effort is not labored. Heart:  cardiac exam reveals an S1-S2.  No clicks or significant murmurs. Abdomen:  abdomen is soft. Extremities:  no peripheral edema. Psych:  patient is oriented to person time and place.  Affect is normal.   Impression & Recommendations:  Problem # 1:  SHORTNESS OF BREATH (ICD-786.05) The patient shortness of breath I believe is somewhat improved.  We will continue  Lasix at 40 mg daily unless there is a problem with his renal function.  Labs to be checked today.  Problem # 2:  PALPITATIONS (ICD-785.1)  The patient still has some palpitations.  I tried to reassure him.  I feel that further workup is not needed at this time.  Orders: TLB-BMP (Basic Metabolic Panel-BMET) (80048-METABOL)  Problem # 3:  ESSENTIAL HYPERTENSION, BENIGN  (ICD-401.1) Blood pressure is under good control today.  No change in therapy.  Patient Instructions: 1)  Labs today, I will call you with results of your potassium 2)  Follow up in 8 weeks

## 2010-05-20 NOTE — Assessment & Plan Note (Signed)
Summary: edema and sob/hms   Visit Type:   add on visit Primary Provider:  Elmore Guise, MD  CC:  shortness of breath.  History of Present Illness:  The patient called today saying that he had shortness of breath.  He was brought as an add on.  I have learned now that the patient was recently hospitalized.  I have reviewed the hospital records.  It was felt that the patient had a community-acquired pneumonia.  He also had some diarrhea.  It is possible that his overall illness was a viral illness.  He was treated with the medications including metronidazole and moxifloxacin and he was discharged home.  He complained of some symptoms in his feet.  He has received some Etodolac.  He says that last night when lying down he felt short of breath.  He also feels that he has some swelling in his feet.  His birth weight he has an ejection fraction of 45-50%.  He has an underlying left bundle branch block.  He has had palpitations felt probably due to supraventricular tachycardia.  He's had been relatively stable over time.  He was evaluated at one point to be sure that he did not have chronotropic incompetence area this was not the case by an event recorder in April, 2010.  It was noted also that the patient had one 2 second pause.  He also had some bradycardia that was not symptomatic.  Because of this is metoprolol dose was decreased to 25 mg daily.    Current Medications (verified): 1)  Metoprolol Succinate 25 Mg Xr24h-Tab (Metoprolol Succinate) .... Take One Tablet By Mouth Daily 2)  Tricor 48 Mg Tabs (Fenofibrate) .... Take One-Half Tablet By Mouth At Bedtime 3)  Aspirin 81 Mg  Tabs (Aspirin) .... Take One Tablet By Mouth Once Daily. 4)  Ocuvite Preservision  Tabs (Multiple Vitamins-Minerals) .... Take One Tablet By Mouth Two Times A Day 5)  Omeprazole 20 Mg Cpdr (Omeprazole) .... Take One Tablet By Mouth Once Daily. 6)  Centrum Silver  Tabs (Multiple Vitamins-Minerals) .... Take One Tablet By Mouth  Once Daily. 7)  Stool Softener 100 Mg Caps (Docusate Sodium) .Marland Kitchen.. 1 Daily As Needed 8)  Fish Oil 1000 Mg Caps (Omega-3 Fatty Acids) .... Take Two By Mouth Once Daily 9)  Furosemide 40 Mg Tabs (Furosemide) .... Take One Tablet By Mouth Daily. 10)  Flora-Q  Caps (Probiotic Product) .... Two Times A Day 11)  Metronidazole 500 Mg Tabs (Metronidazole) .... Three Times A Day 12)  Flomax 0.4 Mg Caps (Tamsulosin Hcl) .... 1/2 Once Daily 13)  Fluticasone Propionate 50 Mcg/act Susp (Fluticasone Propionate) .... Two Times A Day 14)  Acetaminophen 500 Mg  Caps (Acetaminophen) .... As Needed 15)  Etodolac 400 Mg Tabs (Etodolac) .... 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 August, 2010 COPD Nuclear stress study August 08, 2008 possible small reversible ischemic area in the mid and apical segment of the anteroseptal wall Hypertension Gout Elevated triglycerides Palpitations..Discharge weight presumed to be paroxysmal atrial fibrillation but no definite documentation. Systolic murmur Smothering sensation-hospitalization August 06, 2008-question mild volume component-question bradycardia tachycardia component EP evaluation-Dr. Allred....-assess status with outpatient event recorder-April 2010-no proven chronotropic incompetence  Mild MR ..  echo.Marland Kitchen.07/2008 Mild  AI....echo.Marland Kitchen.07/2008 Pneumonia... hospitalization.... May 11, 2009  Review of Systems       Patient denies fever, chills, headache, sweats, change in vision, change in hearing, chest pain, nausea vomiting, urinary symptoms.  All other systems are reviewed and are negative.  Vital Signs:  Patient profile:   77 year old male Height:      71 inches Weight:       176 pounds BMI:     24.64 Pulse rate:   85 / minute BP sitting:   118 / 68  (left arm) Cuff size:   regular  Vitals Entered By: Hardin Negus, RMA (October 12, 2009 11:13 AM)  Physical Exam  General:  patient is stable in general. Head:  head is atraumatic. Eyes:  no xanthelasma. Neck:  there may be slight jugular venous distention. Chest Wall:  no chest wall tenderness. Lungs:  lungs reveal rales at the right base. Heart:  cardiac exam reveals an S1 and S2.  There are no significant murmurs. Abdomen:  abdomen is soft. Msk:  no musculoskeletal deformities. Extremities:  there is mild edema in the feet. Skin:  no skin rashes. Psych:  patient is oriented to person time and place.  Affect is normal.   Impression & Recommendations:  Problem # 1:  SHORTNESS OF BREATH (ICD-786.05)  His updated medication list for this problem includes:    Metoprolol Succinate 25 Mg Xr24h-tab (Metoprolol succinate) .Marland Kitchen... Take one tablet by mouth daily    Aspirin 81 Mg Tabs (Aspirin) .Marland Kitchen... Take one tablet by mouth once daily.    Furosemide 40 Mg Tabs (Furosemide) .Marland Kitchen... Take one tablet by mouth daily. The patient had shortness of breath last night.  He has rales at the right base.  It is possible that some of this may be residual from his pneumonia.  However he does have some mild edema.  His rhythm is regular..  I will increase his diuretic dose for a few days.  Chemistry will be checked.  I will then see the patient for early followup to be sure that he is responding to the approach taken.  Orders: TLB-BMP (Basic Metabolic Panel-BMET) (80048-METABOL) TLB-CBC Platelet - w/Differential (85025-CBCD)  Problem # 2:  PALPITATIONS (ICD-785.1)  His updated medication list for this problem includes:    Metoprolol Succinate 25 Mg Xr24h-tab (Metoprolol succinate) .Marland Kitchen... Take one tablet by mouth daily    Aspirin 81 Mg Tabs (Aspirin) .Marland Kitchen... Take one tablet by mouth once daily. The patient has mild palpitations.   He has noted that his heart rate was mildly reduced while in the hospital.  His beta blocker dose was decreased.  Historically we know that at times he feels short of breath related to palpitations.  I will consider readjusting his beta blocker dose upward when I see him in followup.  Problem # 3:  * DIARRHEA The patient had diarrhea in the hospital.  He is being treated for the possibility of C. difficile.  He says that he had some diarrhea before the hospitalization.  Problem # 4:  * PNEUMONIA By history the patient had a pneumonia treated recently in the hospital.  Currently he has no fever he is not coughing.  No change in therapy at this time.  Patient Instructions: 1)  Take Furosemide twice a day today and tom 2)  Labs today 3)  Follow up on Wed. 6/22

## 2010-05-20 NOTE — Assessment & Plan Note (Signed)
Summary: per check out/sf   Visit Type:  Follow-up Primary Provider:  Elmore Guise, MD  CC:  palpitations.  History of Present Illness: The patient is seen today to followup palpitations and mild edema.  I saw him in the office on October 12, 2009.  At that time there was question that he might have mild fluid overload.  He also had some palpitations.  I noted that his metoprolol dose had been lowered when he was in the hospital for pneumonia.  I gave him extra doses of Lasix.  He says that the swelling in his feet is improved.  He does not think that he had excess urine output.  His weight has not changed significantly.  Chemistry checked that day did show his creatinine to be 1.5.  This is actually somewhat increased from a prior value of 1.2 or 1.3.  He is feeling some palpitations.  He has not had syncope or presyncope.  There is no chest pain.  Current Medications (verified): 1)  Metoprolol Succinate 25 Mg Xr24h-Tab (Metoprolol Succinate) .... Take One Tablet By Mouth Daily 2)  Tricor 48 Mg Tabs (Fenofibrate) .... Take One-Half Tablet By Mouth At Bedtime 3)  Aspirin 81 Mg  Tabs (Aspirin) .... Take One Tablet By Mouth Once Daily. 4)  Ocuvite Preservision  Tabs (Multiple Vitamins-Minerals) .... Take One Tablet By Mouth Two Times A Day 5)  Omeprazole 20 Mg Cpdr (Omeprazole) .... Take One Tablet By Mouth Once Daily. 6)  Centrum Silver  Tabs (Multiple Vitamins-Minerals) .... Take One Tablet By Mouth Once Daily. 7)  Stool Softener 100 Mg Caps (Docusate Sodium) .Marland Kitchen.. 1 Daily As Needed 8)  Fish Oil 1000 Mg Caps (Omega-3 Fatty Acids) .... Take Two By Mouth Once Daily 9)  Furosemide 40 Mg Tabs (Furosemide) .... Take One Tablet By Mouth Daily. 10)  Flora-Q  Caps (Probiotic Product) .... Two Times A Day 11)  Metronidazole 500 Mg Tabs (Metronidazole) .... Three Times A Day 12)  Flomax 0.4 Mg Caps (Tamsulosin Hcl) .... 1/2 Once Daily 13)  Fluticasone Propionate 50 Mcg/act Susp (Fluticasone Propionate) ....  Two Times A Day 14)  Acetaminophen 500 Mg  Caps (Acetaminophen) .... As Needed 15)  Etodolac 400 Mg Tabs (Etodolac) .... 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 August, 2010 COPD Nuclear stress study August 08, 2008 possible small reversible ischemic area in the mid and apical segment of the anteroseptal wall Hypertension Gout Elevated triglycerides Palpitations..Discharge weight presumed to be paroxysmal atrial fibrillation but no definite documentation. Systolic murmur Smothering sensation-hospitalization August 06, 2008-question mild volume component-question bradycardia tachycardia component EP evaluation-Dr. Allred....-assess status with outpatient event recorder-April 2010-no proven chronotropic incompetence  Mild MR ..  echo.Marland Kitchen.07/2008 Mild AI.Marland Kitchen..echo.Marland Kitchen.07/2008 Pneumonia... hospitalization.... May 11, 2009  Review of Systems       Patient denies fever, chills, headache, sweats, rash, change in vision, change in hearing, chest pain, cough, nausea or vomiting, urinary symptoms.  All other systems are reviewed and are negative.  Vital Signs:  Patient profile:   77 year old male Height:      71 inches Weight:      177 pounds BMI:     24.78 Pulse rate:   74 / minute Pulse rhythm:   irregular BP  sitting:   148 / 80  (left arm) Cuff size:   large  Vitals Entered By: Danielle Rankin, CMA (October 14, 2009 10:45 AM)  Physical Exam  General:  patient is stable today. Eyes:  no xanthelasma. Neck:  no jugular venous distention. Lungs:  lungs reveal a few crackles at the right base.  These were present on October 12, 2009.  They may be slightly decreased. Heart:  cardiac exam reveals S1-S2.  A soft  systolic murmur. Abdomen:  abdomen is soft. Extremities:  no peripheral edema. Psych:  patient is oriented to person time and place.  Affect is normal.   Impression & Recommendations:  Problem # 1:  * DIARRHEA The patient has continued diarrhea from his hospitalization.  It is a mild and he is on metronidazole.  He will followup with Dr. Chilton Si  Problem # 2:  * PNEUMONIA The patient is continuing to improve from his pneumonia.  I suspect that the crackles in his right base are residual from this.  Problem # 3:  SHORTNESS OF BREATH (ICD-786.05)  His updated medication list for this problem includes:    Metoprolol Succinate 50 Mg Xr24h-tab (Metoprolol succinate) .Marland Kitchen... Take one tablet by mouth daily    Aspirin 81 Mg Tabs (Aspirin) .Marland Kitchen... Take one tablet by mouth once daily.    Furosemide 40 Mg Tabs (Furosemide) .Marland Kitchen... Take one tablet by mouth daily. There is no significant shortness of breath today.  I'm not sure if the few extra doses of Lasix actually helped.  I do not want discontinued as his creatinine was 1.5 several days ago.  Problem # 4:  PALPITATIONS (ICD-785.1)  His updated medication list for this problem includes:    Metoprolol Succinate 50 Mg Xr24h-tab (Metoprolol succinate) .Marland Kitchen... Take one tablet by mouth daily    Aspirin 81 Mg Tabs (Aspirin) .Marland Kitchen... Take one tablet by mouth once daily. Patient has mild palpitations.  He felt better on 50 mg of metoprolol.  I will return them to this dose.  He had one 2 second pause noted in the hospital.  Patient Instructions: 1)  Increase Metoprolol back to 50mg  daily 2)  Your physician wants you to follow-up in:  6 months. You will receive a reminder letter in the mail two months in advance. If you don't receive a letter, please call our office to schedule the follow-up appointment.

## 2010-05-20 NOTE — Letter (Signed)
Summary: Patient Notice- Polyp Results  [facility]  [address] [place], [place] [address]   Phone: [phone]  Fax: [phone]        June 19, 2009 MRN: [id number]    Levi Lewis [address] [place], [place]  [address]    Dear Mr. Dilmore,  I am pleased to inform you that the colon polyp(s) removed during your recent colonoscopy was (were) found to be benign (no cancer detected) upon pathologic examination.  I recommend you have a repeat colonoscopy examination in _ years to look for recurrent polyps, as having colon polyps increases your risk for having recurrent polyps or even colon cancer in the future.  Should you develop new or worsening symptoms of abdominal pain, bowel habit changes or bleeding from the rectum or bowels, please schedule an evaluation with either your primary care physician or with me.  Additional information/recommendations:  _x_ No further action with gastroenterology is needed at this time. Please      follow-up with your primary care physician for your other healthcare      needs.  __ Please call [phone] to schedule a return visit to review your      situation.  __ Please keep your follow-up visit as already scheduled.  __ Continue treatment plan as outlined the day of your exam.  Please call us if you are having persistent problems or have questions about your condition that have not been fully answered at this time.  Sincerely,  Louis Meckel MD  This letter has been electronically signed by your physician.  Appended Document: Patient Notice- Polyp Results Letter mailed 3.7.11

## 2010-05-20 NOTE — Assessment & Plan Note (Signed)
Summary: f11m   Visit Type:  Follow-up Referring Provider:  Elmore Guise, MD Primary Provider:  Elmore Guise, MD  CC:  palpitations.  History of Present Illness: The patient is seen for cardiology followup.  I saw him last June, 2011.  He really looks good today.  He has some mild shortness of breath that is chronic.  On the day that he forgot his metoprolol he felt some increased heart rate.  However when he is taking his meds he appears to do quite well.  He's not having any chest pain or shortness of breath.  Current Medications (verified): 1)  Metoprolol Succinate 50 Mg Xr24h-Tab (Metoprolol Succinate) .... Take One Tablet By Mouth Daily 2)  Tricor 48 Mg Tabs (Fenofibrate) .... Take One-Half Tablet By Mouth At Bedtime 3)  Aspirin 81 Mg  Tabs (Aspirin) .... Take One Tablet By Mouth Once Daily. 4)  Ocuvite Preservision  Tabs (Multiple Vitamins-Minerals) .... Take One Tablet By Mouth Two Times A Day 5)  Omeprazole 20 Mg Cpdr (Omeprazole) .... Take One Tablet By Mouth Once Daily. 6)  Centrum Silver  Tabs (Multiple Vitamins-Minerals) .... Take One Tablet By Mouth Once Daily. 7)  Fish Oil 1000 Mg Caps (Omega-3 Fatty Acids) .... Take Two By Mouth Once Daily 8)  Furosemide 40 Mg Tabs (Furosemide) .... Take One Tablet By Mouth Daily. 9)  Flomax 0.4 Mg Caps (Tamsulosin Hcl) .... 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 August, 2010 COPD Nuclear stress study August 08, 2008 possible small reversible ischemic area in the mid and apical segment of the anteroseptal wall Hypertension Gout Elevated triglycerides Palpitations... presumed to be paroxysmal atrial fibrillation but no definite documentation. Systolic  murmur Smothering sensation-hospitalization August 06, 2008-question mild volume component-question bradycardia tachycardia component EP evaluation-Dr. Allred....-assess status with outpatient event recorder-April 2010-no proven chronotropic incompetence  Mild MR ..  echo.Marland Kitchen.07/2008 Mild AI.Marland Kitchen..echo.Marland Kitchen.07/2008 Pneumonia... hospitalization.... May 11, 2009  Review of Systems       Patient denies fever, chills, headache, sweats, rash, change in vision, change in hearing, chest pain, cough, nausea vomiting, urinary symptoms.  All of the systems are reviewed and are negative.  Vital Signs:  Patient profile:   77 year old male Height:      71 inches Weight:      182 pounds BMI:     25.48 Pulse rate:   63 / minute BP sitting:   120 / 70  (left arm)  Vitals Entered By: Laurance Flatten CMA (March 18, 2010 8:28 AM)  Physical Exam  General:  patient looks good. Eyes:  no xanthelasma. Neck:  no jugular venous extension. Lungs:  lungs are clear.  Respiratory effort is nonlabored. Heart:  cardiac exam reveals S1-S2.  A soft systolic murmur. Abdomen:  abdomen soft. Extremities:  no peripheral edema. Psych:  patient is oriented to person time and place.  Affect is normal.   Impression & Recommendations:  Problem # 1:  * PNEUMONIA The patient tells me he's had pneumonia twice during the year.  He is stable now.  Problem # 2:  SHORTNESS OF BREATH (ICD-786.05)  His updated medication list for this problem includes:    Metoprolol Succinate 50 Mg Xr24h-tab (Metoprolol succinate) .Marland Kitchen... Take one tablet by mouth daily  Aspirin 81 Mg Tabs (Aspirin) .Marland Kitchen... Take one tablet by mouth once daily.    Furosemide 40 Mg Tabs (Furosemide) .Marland Kitchen... Take one tablet by mouth daily.  Orders: EKG w/ Interpretation (93000) The patient's shortness of breath is stable.  Historically we feel that there may have been a mild volume component and a mild bradycardia tachycardia component.  These are stable.  EKG is done  today.  There is no change.  He had sinus rhythm with old left bundle branch block.  No further workup.  Problem # 3:  PALPITATIONS (ICD-785.1)  His updated medication list for this problem includes:    Metoprolol Succinate 50 Mg Xr24h-tab (Metoprolol succinate) .Marland Kitchen... Take one tablet by mouth daily    Aspirin 81 Mg Tabs (Aspirin) .Marland Kitchen... Take one tablet by mouth once daily. His palpitations are controlled.  No change in therapy.  Problem # 4:  ESSENTIAL HYPERTENSION, BENIGN (ICD-401.1) systolic pressure today is 148.  The patient is 77 years of age and stable.  I feel it is most prudent not to change his medicines.  Problem # 5:  LBBB (ICD-426.3) Left bundle branch block is old.  No change in therapy. The patient looks quite good.  No further workup.  Patient Instructions: 1)  Your physician recommends that you schedule a follow-up appointment in: 6 months with Dr. Myrtis Ser 2)  Your physician recommends that you continue on your current medications as directed. Please refer to the Current Medication list given to you today.

## 2010-05-20 NOTE — Progress Notes (Signed)
Summary: Triage  Phone Note From Other Clinic   Caller: Dr. Judie Petit  [phone] Call For: Dr. Arlyce Dice Summary of Call: Pt. has a positive stool. Would like the pt. seen next week with Dr. Arlyce Dice. Next available is not until 07-20-09 Initial call taken by: Karna Christmas,  June 12, 2009 2:13 PM  Follow-up for Phone Call        Pt. will see Dr.Ilay Capshaw on 06-15-09 at 9:30am. Lew Dawes will fax records and advise pt. of appt/med.list/insurance card. Follow-up by: Laureen Ochs LPN,  June 12, 2009 2:23 PM

## 2010-05-26 NOTE — Progress Notes (Signed)
Summary: clearance for surgery  Phone Note Call from Patient Call back at Home Phone [phone]   Caller: Patient Reason for Call: Talk to Nurse Summary of Call: pt need to be clear for upcoming surgery.  Initial call taken by: Lorne Skeens,  May 11, 2010 11:34 AM  Follow-up for Phone Call        shoulder surgery w/Dr Cleophas Dunker, needs clearance, pt states he has been doing ok since he was seen in Dec., will send to Dr Myrtis Ser for review Meredith Staggers, RN  May 11, 2010 3:18 PM   pt dropped off surg. clearance form for Dr Myrtis Ser to complete Meredith Staggers, RN  May 12, 2010 2:06 PM   Additional Follow-up for Phone Call Additional follow up Details #1::        Dr Myrtis Ser cleared pt, form completed and faxed Meredith Staggers, RN  May 18, 2010 10:07 AM

## 2010-06-09 ENCOUNTER — Encounter: Payer: Self-pay | Admitting: Cardiology

## 2010-06-15 NOTE — Letter (Signed)
Summary: [facility]  [facility]   Imported By: Marylou Mccoy 06/09/2010 09:29:15  _____________________________________________________________________  External Attachment:    Type:   Image     Comment:   External Document

## 2010-07-04 LAB — CLOSTRIDIUM DIFFICILE EIA

## 2010-07-04 LAB — URINALYSIS, ROUTINE W REFLEX MICROSCOPIC
Bilirubin Urine: NEGATIVE
Ketones, ur: NEGATIVE mg/dL
Ketones, ur: NEGATIVE mg/dL
Nitrite: NEGATIVE
Nitrite: NEGATIVE
Protein, ur: NEGATIVE mg/dL
Protein, ur: NEGATIVE mg/dL
Urobilinogen, UA: 1 mg/dL (ref 0.0–1.0)
Urobilinogen, UA: 0.2 mg/dL (ref 0.0–1.0)
pH: 5.5 (ref 5.0–8.0)

## 2010-07-04 LAB — BASIC METABOLIC PANEL
BUN: 18 mg/dL (ref 6–23)
BUN: 27 mg/dL — ABNORMAL HIGH (ref 6–23)
BUN: 15 mg/dL (ref 6–23)
BUN: 18 mg/dL (ref 6–23)
CO2: 21 mEq/L (ref 19–32)
CO2: 19 mEq/L (ref 19–32)
CO2: 23 mEq/L (ref 19–32)
CO2: 24 mEq/L (ref 19–32)
CO2: 25 mEq/L (ref 19–32)
Calcium: 8.2 mg/dL — ABNORMAL LOW (ref 8.4–10.5)
Calcium: 8.1 mg/dL — ABNORMAL LOW (ref 8.4–10.5)
Chloride: 103 mEq/L (ref 96–112)
Chloride: 112 mEq/L (ref 96–112)
Chloride: 104 mEq/L (ref 96–112)
Chloride: 106 mEq/L (ref 96–112)
Chloride: 107 mEq/L (ref 96–112)
Chloride: 108 mEq/L (ref 96–112)
GFR calc Af Amer: 49 mL/min — ABNORMAL LOW (ref >60)
GFR calc Af Amer: 50 mL/min — ABNORMAL LOW (ref >60)
GFR calc Af Amer: 53 mL/min — ABNORMAL LOW (ref >60)
GFR calc Af Amer: 54 mL/min — ABNORMAL LOW (ref >60)
GFR calc non Af Amer: 41 mL/min — ABNORMAL LOW (ref >60)
GFR calc non Af Amer: 41 mL/min — ABNORMAL LOW (ref >60)
GFR calc non Af Amer: 47 mL/min — ABNORMAL LOW (ref >60)
Glucose, Bld: 132 mg/dL — ABNORMAL HIGH (ref 70–99)
Glucose, Bld: 114 mg/dL — ABNORMAL HIGH (ref 70–99)
Glucose, Bld: 147 mg/dL — ABNORMAL HIGH (ref 70–99)
Glucose, Bld: 96 mg/dL (ref 70–99)
Potassium: 3.7 mEq/L (ref 3.5–5.1)
Potassium: 3.8 mEq/L (ref 3.5–5.1)
Potassium: 3.8 mEq/L (ref 3.5–5.1)
Potassium: 3.9 mEq/L (ref 3.5–5.1)
Potassium: 4 mEq/L (ref 3.5–5.1)
Potassium: 4 mEq/L (ref 3.5–5.1)
Potassium: 4.1 mEq/L (ref 3.5–5.1)
Sodium: 136 mEq/L (ref 135–145)
Sodium: 137 mEq/L (ref 135–145)
Sodium: 139 mEq/L (ref 135–145)
Sodium: 137 mEq/L (ref 135–145)

## 2010-07-04 LAB — POCT CARDIAC MARKERS
CKMB, poc: 1.1 ng/mL (ref 1.0–8.0)
Myoglobin, poc: 479 ng/mL (ref 12–200)
Troponin i, poc: <0.05 ng/mL (ref 0.00–0.09)
Troponin i, poc: <0.05 ng/mL (ref 0.00–0.09)

## 2010-07-04 LAB — CBC
HCT: 33.7 % — ABNORMAL LOW (ref 39.0–52.0)
HCT: 34.1 % — ABNORMAL LOW (ref 39.0–52.0)
HCT: 34.2 % — ABNORMAL LOW (ref 39.0–52.0)
HCT: 34.5 % — ABNORMAL LOW (ref 39.0–52.0)
HCT: 34 % — ABNORMAL LOW (ref 39.0–52.0)
HCT: 34 % — ABNORMAL LOW (ref 39.0–52.0)
HCT: 35.2 % — ABNORMAL LOW (ref 39.0–52.0)
HCT: 38.3 % — ABNORMAL LOW (ref 39.0–52.0)
Hemoglobin: 11.6 g/dL — ABNORMAL LOW (ref 13.0–17.0)
Hemoglobin: 11.7 g/dL — ABNORMAL LOW (ref 13.0–17.0)
Hemoglobin: 12.4 g/dL — ABNORMAL LOW (ref 13.0–17.0)
Hemoglobin: 11.8 g/dL — ABNORMAL LOW (ref 13.0–17.0)
MCHC: 34.3 g/dL (ref 30.0–36.0)
MCHC: 35.9 g/dL (ref 30.0–36.0)
MCHC: 33.6 g/dL (ref 30.0–36.0)
MCHC: 33.9 g/dL (ref 30.0–36.0)
MCHC: 34 g/dL (ref 30.0–36.0)
MCV: 93.4 fL (ref 78.0–100.0)
MCV: 93.9 fL (ref 78.0–100.0)
MCV: 93.9 fL (ref 78.0–100.0)
MCV: 94.3 fL (ref 78.0–100.0)
MCV: 91.6 fL (ref 78.0–100.0)
MCV: 91.8 fL (ref 78.0–100.0)
MCV: 91.9 fL (ref 78.0–100.0)
Platelets: 204 10*3/uL (ref 150–400)
Platelets: 162 10*3/uL (ref 150–400)
Platelets: 188 10*3/uL (ref 150–400)
RBC: 3.59 MIL/uL — ABNORMAL LOW (ref 4.22–5.81)
RBC: 3.71 MIL/uL — ABNORMAL LOW (ref 4.22–5.81)
RBC: 3.86 MIL/uL — ABNORMAL LOW (ref 4.22–5.81)
RBC: 3.71 MIL/uL — ABNORMAL LOW (ref 4.22–5.81)
RBC: 3.81 MIL/uL — ABNORMAL LOW (ref 4.22–5.81)
RDW: 12.7 % (ref 11.5–15.5)
RDW: 12.8 % (ref 11.5–15.5)
RDW: 13.1 % (ref 11.5–15.5)
RDW: 13.2 % (ref 11.5–15.5)
RDW: 13.7 % (ref 11.5–15.5)
RDW: 13.7 % (ref 11.5–15.5)
RDW: 14 % (ref 11.5–15.5)
WBC: 15.4 10*3/uL — ABNORMAL HIGH (ref 4.0–10.5)
WBC: 5.9 10*3/uL (ref 4.0–10.5)
WBC: 6.3 10*3/uL (ref 4.0–10.5)

## 2010-07-04 LAB — LACTIC ACID, PLASMA: Lactic Acid, Venous: 2.8 mmol/L — ABNORMAL HIGH (ref 0.5–2.2)

## 2010-07-04 LAB — CARDIAC PANEL(CRET KIN+CKTOT+MB+TROPI)
CK, MB: 0.7 ng/mL (ref 0.3–4.0)
CK, MB: 0.9 ng/mL (ref 0.3–4.0)
CK, MB: 1.1 ng/mL (ref 0.3–4.0)
Relative Index: INVALID (ref 0.0–2.5)
Relative Index: INVALID (ref 0.0–2.5)
Total CK: 58 U/L (ref 7–232)
Troponin I: 0.02 ng/mL (ref 0.00–0.06)
Troponin I: 0.02 ng/mL (ref 0.00–0.06)

## 2010-07-04 LAB — COMPREHENSIVE METABOLIC PANEL
ALT: 19 U/L (ref 0–53)
Albumin: 2.6 g/dL — ABNORMAL LOW (ref 3.5–5.2)
BUN: 17 mg/dL (ref 6–23)
CO2: 22 mEq/L (ref 19–32)
Calcium: 8.1 mg/dL — ABNORMAL LOW (ref 8.4–10.5)
Creatinine, Ser: 1.16 mg/dL (ref 0.4–1.5)
Creatinine, Ser: 1.19 mg/dL (ref 0.4–1.5)
GFR calc Af Amer: >60 mL/min (ref >60)
GFR calc non Af Amer: 57 mL/min — ABNORMAL LOW (ref >60)
Glucose, Bld: 135 mg/dL — ABNORMAL HIGH (ref 70–99)
Potassium: 3.5 mEq/L (ref 3.5–5.1)
Sodium: 138 mEq/L (ref 135–145)
Total Protein: 6 g/dL (ref 6.0–8.3)

## 2010-07-04 LAB — DIFFERENTIAL
Basophils Absolute: 0 10*3/uL (ref 0.0–0.1)
Basophils Absolute: 0 10*3/uL (ref 0.0–0.1)
Basophils Absolute: 0 10*3/uL (ref 0.0–0.1)
Basophils Relative: 0 % (ref 0–1)
Basophils Relative: 0 % (ref 0–1)
Eosinophils Absolute: 0 10*3/uL (ref 0.0–0.7)
Eosinophils Absolute: 0.2 10*3/uL (ref 0.0–0.7)
Eosinophils Absolute: 0.2 10*3/uL (ref 0.0–0.7)
Eosinophils Relative: 2 % (ref 0–5)
Lymphocytes Relative: 16 % (ref 12–46)
Lymphocytes Relative: 30 % (ref 12–46)
Lymphocytes Relative: 8 % — ABNORMAL LOW (ref 12–46)
Lymphocytes Relative: 13 % (ref 12–46)
Lymphs Abs: 1.9 10*3/uL (ref 0.7–4.0)
Lymphs Abs: 2.7 10*3/uL (ref 0.7–4.0)
Monocytes Absolute: 0.8 10*3/uL (ref 0.1–1.0)
Monocytes Absolute: 0.9 10*3/uL (ref 0.1–1.0)
Monocytes Absolute: 1.2 10*3/uL — ABNORMAL HIGH (ref 0.1–1.0)
Monocytes Absolute: 0.6 10*3/uL (ref 0.1–1.0)
Monocytes Relative: 9 % (ref 3–12)
Neutro Abs: 20.4 10*3/uL — ABNORMAL HIGH (ref 1.7–7.7)
Neutro Abs: 5 10*3/uL (ref 1.7–7.7)
Neutro Abs: 7.1 10*3/uL (ref 1.7–7.7)
Neutrophils Relative %: 68 % (ref 43–77)
Neutrophils Relative %: 87 % — ABNORMAL HIGH (ref 43–77)
Neutrophils Relative %: 80 % — ABNORMAL HIGH (ref 43–77)

## 2010-07-04 LAB — CULTURE, BLOOD (ROUTINE X 2): Culture: NO GROWTH

## 2010-07-04 LAB — URINE CULTURE
Colony Count: NO GROWTH
Culture: NO GROWTH

## 2010-07-04 LAB — HEPATIC FUNCTION PANEL
ALT: 19 U/L (ref 0–53)
AST: 30 U/L (ref 0–37)
Albumin: 3.4 g/dL — ABNORMAL LOW (ref 3.5–5.2)
Bilirubin, Direct: 0.2 mg/dL (ref 0.0–0.3)
Total Protein: 6.9 g/dL (ref 6.0–8.3)

## 2010-07-04 LAB — MAGNESIUM: Magnesium: 2.3 mg/dL (ref 1.5–2.5)

## 2010-07-28 LAB — MAGNESIUM: Magnesium: 2.4 mg/dL (ref 1.5–2.5)

## 2010-07-28 LAB — CK TOTAL AND CKMB (NOT AT ARMC)
CK, MB: 1.9 ng/mL (ref 0.3–4.0)
CK, MB: 1.9 ng/mL (ref 0.3–4.0)
Relative Index: INVALID (ref 0.0–2.5)
Relative Index: INVALID (ref 0.0–2.5)
Total CK: 50 U/L (ref 7–232)
Total CK: 53 U/L (ref 7–232)
Total CK: 57 U/L (ref 7–232)

## 2010-07-28 LAB — POCT CARDIAC MARKERS
CKMB, poc: 1 ng/mL (ref 1.0–8.0)
CKMB, poc: 1.1 ng/mL (ref 1.0–8.0)
Myoglobin, poc: 73.5 ng/mL (ref 12–200)

## 2010-07-28 LAB — CBC
Hemoglobin: 14.3 g/dL (ref 13.0–17.0)
Hemoglobin: 14.4 g/dL (ref 13.0–17.0)
MCHC: 34.9 g/dL (ref 30.0–36.0)
MCHC: 35 g/dL (ref 30.0–36.0)
Platelets: 235 10*3/uL (ref 150–400)
Platelets: 253 10*3/uL (ref 150–400)
RBC: 4.32 MIL/uL (ref 4.22–5.81)
RDW: 13.7 % (ref 11.5–15.5)
RDW: 13.8 % (ref 11.5–15.5)
WBC: 9.2 10*3/uL (ref 4.0–10.5)

## 2010-07-28 LAB — COMPREHENSIVE METABOLIC PANEL
ALT: 20 U/L (ref 0–53)
BUN: 15 mg/dL (ref 6–23)
Calcium: 8.6 mg/dL (ref 8.4–10.5)
Glucose, Bld: 146 mg/dL — ABNORMAL HIGH (ref 70–99)
Sodium: 139 mEq/L (ref 135–145)
Total Protein: 6.4 g/dL (ref 6.0–8.3)

## 2010-07-28 LAB — D-DIMER, QUANTITATIVE: D-Dimer, Quant: 0.53 ug/mL-FEU — ABNORMAL HIGH (ref 0.00–0.48)

## 2010-07-28 LAB — DIFFERENTIAL
Lymphs Abs: 2.8 10*3/uL (ref 0.7–4.0)
Monocytes Relative: 11 % (ref 3–12)
Neutro Abs: 3.9 10*3/uL (ref 1.7–7.7)
Neutrophils Relative %: 50 % (ref 43–77)

## 2010-07-28 LAB — BASIC METABOLIC PANEL
BUN: 15 mg/dL (ref 6–23)
CO2: 26 mEq/L (ref 19–32)
Calcium: 8.9 mg/dL (ref 8.4–10.5)
Creatinine, Ser: 1.12 mg/dL (ref 0.4–1.5)
GFR calc Af Amer: >60 mL/min (ref >60)
GFR calc non Af Amer: >60 mL/min (ref >60)
Sodium: 140 mEq/L (ref 135–145)

## 2010-07-28 LAB — LIPID PANEL: Triglycerides: 384 mg/dL — ABNORMAL HIGH (ref <150)

## 2010-07-28 LAB — GLUCOSE, CAPILLARY: Glucose-Capillary: 154 mg/dL — ABNORMAL HIGH (ref 70–99)

## 2010-07-28 LAB — APTT: aPTT: 32 seconds (ref 24–37)

## 2010-07-28 LAB — PHOSPHORUS: Phosphorus: 4.1 mg/dL (ref 2.3–4.6)

## 2010-07-28 LAB — TROPONIN I
Troponin I: 0.02 ng/mL (ref 0.00–0.06)
Troponin I: <0.01 ng/mL (ref 0.00–0.06)

## 2010-08-03 ENCOUNTER — Other Ambulatory Visit: Payer: Self-pay | Admitting: Cardiology

## 2010-08-31 NOTE — Consult Note (Signed)
NAME:  Levi Lewis, Levi Lewis NO.:  [id number]   MEDICAL RECORD NO.:  [id number]          PATIENT TYPE:  INP   LOCATION:  3729                         FACILITY:  [facility]   PHYSICIAN:  Hillis Range, MD       DATE OF BIRTH:  21-Oct-1917   DATE OF CONSULTATION:  DATE OF DISCHARGE:                                 CONSULTATION   REQUESTING PHYSICIAN:  Luis Abed, MD, FACC   REASON FOR CONSULTATION:  Possible symptomatic arrhythmias.   HISTORY OF PRESENT ILLNESS:  Levi Lewis is a pleasant 77 year old  gentleman with a history of left bundle-branch block, hypertension, and  presumed paroxysmal atrial fibrillation, who was admitted with  progressive dyspnea.  The patient reports occasional palpitations.  He  notes that these palpitations had previously been reasonably well  controlled with digoxin.  He reports over the past few weeks that with  moderate exertion, he develops a sensation of smothering  lasting  several minutes and resolving with sensation of activities.  He denies  any chest pain.  He describes this shortness of breath is moderate.  He  denies associated dizziness, palpitations, presyncope, or syncope.  He  has also noted progressive symptoms of shortness of breath at rest with  mild orthopnea.  He is unaware of any lower extremity edema.  He  presented to the [facility] Emergency Department and was subsequently  admitted for further evaluation and management.  He was felt to have  only minimal volume overload on exam.  Nevertheless, with gentle  diuresis, his symptoms have significantly improved.  He has been  observed on telemetry to have a first-degree AV block and left bundle-  branch block but no significant bradycardias, pauses, heart block, or  tachycardic arrhythmias.  He has maintained sinus rhythm.  A chest CT  has been performed, which revealed bibasilar peripheral interstitial  pattern consistent with chronic pulmonary disease or fibrosis.  The  left  coronary artery was also noted to be heavily calcified.  The patient now  consulted by Electrophysiology for possible symptomatic arrhythmias.   PAST MEDICAL HISTORY:  1. Hypertension.  2. Presumed paroxysmal atrial fibrillation, though this has not been      well documented  3. Nuclear stress test in 2002 revealed no significant abnormalities.  4. Gout.  5. Degenerative joint disease and degenerative disk disease.   ALLERGIES:  No known drug allergies.   HOME MEDICATIONS:  1. Aspirin 81 mg daily.  2. Digoxin 0.125 mg daily.  3. Cardizem 30 mg daily.  4. Toprol-XL 100 mg daily.  5. Multivitamin daily.   CURRENT MEDICATIONS:  1. Protonix 40 mg daily.  2. Aspirin 325 mg daily.  3. Heparin 5000 units subcu 8 hours.  4. Toprol-XL 25 mg daily.  5. TriCor 48 mg nightly.   SOCIAL HISTORY:  The patient denies tobacco, alcohol, or drug use.   FAMILY HISTORY:  Notable for a sister, who died of coronary artery  disease in her 15s.   REVIEW OF SYSTEMS:  All systems were reviewed and negative except as  outlined in the  HPI above.   PHYSICAL EXAMINATION:  VITAL SIGNS:  Blood pressure 123/60, heart rate  68, respirations 18, sats 93% on 2 L.  Telemetry reveals sinus rhythm  with a first-degree AV block and left bundle-branch block, but no other  arrhythmias.  GENERAL:  The patient is a thin elderly male in no acute distress.  He  is alert and oriented x3.  HEENT:  Normocephalic, atraumatic.  Sclerae clear.  Conjunctivae pink.  Oropharynx clear.  NECK:  Supple.  No thyromegaly or bruits.  The patient's JVP is 10 cm.  LUNGS:  Few bibasilar rales, otherwise clear.  HEART:  Regular rate and rhythm, 2/6 systolic ejection murmur along the  left sternal border.  GI:  Soft, nontender, nondistended.  Positive bowel sounds.  EXTREMITIES:  No clubbing, cyanosis, or edema.  NEUROLOGIC:  Cranial nerves II-XII are intact.  Strength and sensation  are intact.  SKIN:  No ecchymoses or  lacerations.  MUSCULOSKELETAL:  No deformity or atrophy.  PSYCH:  Euthymic mood.  Full affect.   EKG reveals sinus rhythm with a first-degree AV block measuring 75  milliseconds and a left bundle-branch block.   Chest x-ray:  I have reviewed the patient's chest x-ray personally and  this reveals a normal heart size with mild diffuse interstitial  prominence.   Transthoracic echocardiogram reveals a left ventricular end-diastolic  dimension of 40, IVS 11, septal thickness 14 mm, ejection fraction 80%  with mild mitral regurgitation and mild aortic regurgitation.   LABORATORY DATA:  Hematocrit 39, platelets 235.  Creatinine 1.12.  Cardiac markers negative.  TSH 2.5.  BNP 112.  Digoxin 0.3.   IMPRESSION:  Levi Lewis is a pleasant 77 year old gentleman with  hypertension, left bundle-branch block, mildly depressed left  ventricular ejection fraction, and a possible history of paroxysmal  atrial fibrillation, which has not well been documented, who is admitted  with progressive exertional dyspnea as well as mild orthopnea.  The  patient has a mildly depressed ejection fraction as well as a left  bundle-branch block.  I therefore think that we have to certainly  entertain the possibility of ischemia.  He has a calcific left main  coronary artery upon his recent chest CT.  He has been observed on  telemetry to have no arrhythmias thus far.  I think that a modified  Bruce protocol exercise treadmill test would be helpful to assess the  patient's heart rate response to ambulation and activities.  If he has a  reasonable heart rate response without significant heart block, pauses,  or uncontrolled tachycardias, then I do not think that this would be the  cause of his dyspnea.  I think that a Myoview would also be helpful to  evaluate for ischemia.  If it is only mildly abnormal, then I think we  should treat him with medical therapies.  However, if he has severe  abnormalities, then we  should consider a heart cath at that time.  If  the GXT and Myoview are unremarkable,  then I think that the patient could be discharged after gentle diuresis  and a 21-day outpatient event monitor may be helpful.  Further cardiac  assessment and workup will be directed to Dr. Willa Rough.  I do not  think that electrophysiologic study would be beneficial at this time.      Hillis Range, MD  Electronically Signed     JA/MEDQ  D:  08/08/2008  T:  08/08/2008  Job:  [id number]   cc:  Luis Abed, MD, [facility]  Erskine Speed, M.D.

## 2010-08-31 NOTE — Consult Note (Signed)
NAME:  Levi Lewis, Levi Lewis NO.:  [id number]   MEDICAL RECORD NO.:  [id number]          PATIENT TYPE:  INP   LOCATION:  3729                         FACILITY:  [facility]   PHYSICIAN:  Luis Abed, MD, FACCDATE OF BIRTH:  Jan 18, 1918   DATE OF CONSULTATION:  DATE OF DISCHARGE:                                 CONSULTATION   Levi Lewis is a delightful 77 year old gentleman who was admitted with a  smothering sensation.  There is question of some atrial fibrillation  in the past, but I am not sure that this has been documented.  He has  had palpitations.  He has had a slow resting heart rate in general.  However, with his palpitations over time he has received medicines to  protect him from an increased heart rate.  He does not have significant  chest pain.  He had a nuclear exercise test in 2002 showing no  significant abnormalities.  He remains active.  Most recently he has had  some exertional shortness of breath.  He has not had chest pain, syncope  or presyncope.  In the past few days, he had marked increase in his  shortness of breath with episodes of PND and orthopnea.  He was admitted  to the hospital last night.  He has had a mild diuresis, although his  chest x-ray did not show CHF.  His BNP was not significantly elevated.  Chest CT revealed some probable chronic interstitial lung disease.  There were some nodular opacities in the right lung that will need  follow-up in 3 months.  There was some coronary calcification.   Cardiology is asked to see him to help assess the symptoms that he has  been having.   PAST MEDICAL HISTORY:  Allergies:  No known drug allergies.   Medications:  On admission he was on aspirin, digoxin 0.125, Cardizem  question dose, metoprolol XL 100, and multivitamin.   SOCIAL HISTORY:  The patient does not smoke.  He is retired from a [employer] and lives with his wife.   FAMILY HISTORY:  Noncontributory.   PHYSICAL EXAMINATION:   VITAL SIGNS:  Blood pressure is 100/62 with a  pulse of 60.  Today he is looking and feeling well.  He has no fevers or chills.  He does have skin abnormalities.  HEENT:  Reveals no xanthelasma.  He has normal extraocular motion.  There are no carotid bruits.  There is no jugular venous distention.  LUNGS:  Clear.  Respiratory effort is not labored.  CARDIAC:  Exam reveals an S1 with an S2.  There is a soft systolic  murmur.  ABDOMEN:  Soft.  He has no significant peripheral edema.  There are no major musculoskeletal deformities.   EKG:  EKG reveals left bundle branch block with first-degree AV block.  Chest x-ray had shown no acute abnormalities.  Chest CT revealed  probable chronic interstitial disease.  Also nodular opacities in the  right lung that will need follow-up in 3 months.  There was also some  coronary calcification.  Troponins have been normal so  far.  Hemoglobin  14.4.  BUN 16 with creatinine 1.19.  TSH is normal.  BNP was 112.   PROBLEM LIST:  1. History of hypertension.  This is not an active problem at this      time.  2. Question of atrial fibrillation in the past, but we do not have      documentation of this.  3. Gout.  He had a flare in the last few weeks.  4. Left bundle branch block and old first-degree AV block.  The left      bundle branch block also is old.  5. Abnormal chest CT with some interstitial lung disease but also      nodular opacities that will need follow-up in 3 months.  6. Coronary calcification seen on the chest CT.  7. Systolic murmur.  There is a soft murmur heard on the physical      exam.  A 2-D echo has been done but not yet reviewed, and I will      review it.  8. *Presentation with a smothering feeling.  This occurred on April 20      and 21st.  There seems to have been paroxysmal nocturnal dyspnea      and orthopnea.  However, the patient does not appear to have overt      heart failure.  There has been a mild diuresis since being  here.      The etiology of this presenting symptom is not she had clear.      Question:  (1) Left ventricular dysfunction.  We are awaiting 2-D      echo to look at left ventricular function and valvular function.      (2)  Question ischemia.  There is no proof yet.  (3)  Chronotropic      incompetence with first-degree AV block and left bundle branch      block.  These are old.  However, he may have significant brady-      tachy syndrome.  Digoxin will be held and metoprolol dose      decreased.  We will follow his rhythm.  He may need a 21-day      monitor to help with further information.  The overall plan is to      assess the 2-D echo and to make medicine changes.  We will monitor      his heart rate.  The patient may need a permanent pacemaker, but we      do not have proof for this yet.  Because I have no other obvious      answers, I will ask EP to see him to see if there is any type of EP      testing that would help answer more about his ability to conduct      his rate properly.  9. Consider ischemia workup, but I am not convinced that this is the      issue at this time.  If no obvious answers, we may need to proceed      with an outpatient 21-day monitor.   During this consultation I reviewed all prior records in the computer  and all the records in the current chart.  Also I read and reviewed the  EKG personally.  In addition, I called Dr. Elmore Guise to discuss the  patient's prior history to try to get a better idea of what was known  about his rhythm in the past.  Luis Abed, MD, [facility]  Electronically Signed     JDK/MEDQ  D:  08/07/2008  T:  08/07/2008  Job:  [id number]   cc:   Erskine Speed, M.D.

## 2010-08-31 NOTE — H&P (Signed)
NAME:  Levi Lewis, Levi Lewis                 ACCOUNT NO.:  [id number]   MEDICAL RECORD NO.:  [id number]          PATIENT TYPE:  INP   LOCATION:  1829                         FACILITY:  [facility]   PHYSICIAN:  Darryl D. Prime, MD    DATE OF BIRTH:  1918/03/29   DATE OF ADMISSION:  08/06/2008  DATE OF DISCHARGE:                              HISTORY & PHYSICAL   PRIMARY CARE PHYSICIAN:  Erskine Speed, MD   The patient is full code.   CHIEF COMPLAINT:  Smothering.   HISTORY OF PRESENT ILLNESS:  Mr. Hineman is an 77 year old male with  questionable history of atrial fibrillation.  He has a history of  hypertension and gout, who notes a smothering sensation on the night  prior to admission between 2:00 a.m. and 5:00 a.m. with significant  orthopnea.  He woke up with an episode of possible paroxysmal nocturnal  dyspnea.  The patient denies any cough or fever.  He denies any chest  pain.  He notes increased skipped-beat sensation over the last week,  however, and he notes at this time his pulse is in a range of 100 or it  could be as low as 40.  He has had a recent gout flare and has been on  etodolac for the last 2 weeks and notes a 4-pound weight gain with this.  The patient notes progressive dyspnea on exertion as well over the last  year.  The patient in the emergency room was given oxygen, aspirin was  given.  He notes he takes his medications as prescribed including  digoxin.  He notes he has had a stress test about 7-8 years ago which  was unremarkable.  He has never had a cardiac catheterization.  The  patient because of this smothering sensation, had a D-dimer ordered,  which was mildly elevated at 0.53 prompting CT chest.  CT chest was  negative for pulmonary embolism and did show chronic interstitial  pulmonary disease with bibasilar peripheral reticular markings.  He had  nonspecific lesions, nodules in the right major and minor fissure.  The  largest 11 mm with a recommendation for 3  months' followup CT.  He had  calcification of his left coronary artery.  He had left bundle-branch  block and his EKG which he has had dating back to 2007.  He denies any  presyncope or syncope, although he notes lightheadedness when standing  up, but this has not been worse recently.   PAST MEDICAL/SURGICAL HISTORY:  1. History of hypertension.  2. History of possible atrial fibrillation, not on Coumadin.  3. History of gout, last episode again 2 weeks ago, the left foot was      involved.  4. History of right knee and right shoulder surgery.  5. History of back surgery.   ALLERGIES:  No known drug allergies.   MEDICATIONS:  1. Baby aspirin 81 mg daily.  2. Digoxin 0.125 mg daily.  3. Cardizem long acting 30 mg daily.  4. Metoprolol 100 mg XL daily.  5. Ocuvite daily.   REVIEW OF SYSTEMS:  A 14 point  review of systems is negative and as  stated above.   SOCIAL HISTORY:  He has never smoked or drank.  No illicit drug use.  He  is retired from the [employer] and lives with his wife.   FAMILY HISTORY:  Sister passed away due to complications of a myocardial  infarction in her 35s.   PHYSICAL EXAMINATION:  VITAL SIGNS:  Temperature is 97.7 with a blood  pressure 170/71, pulse of 65, respiratory rate of 18, saturations were  95% on room air.  GENERAL:  The patient is sitting up and looks much younger than his  stated age, in no acute distress.  HEENT:  Normocephalic and atraumatic.  Pupils equal, round, and reactive  to light.  Extraocular movements intact.  The oropharynx is dry.  NECK:  Supple with no lymphadenopathy or thyromegaly.  Jugular venous  distention is in the range of 12 cm of water estimated.  LUNGS:  Bibasilar infiltrates.  Dry crackles at the bases.  CARDIOVASCULAR:  Distant regular rhythm and rate.  ABDOMEN:  Soft, nontender, nondistended with no hepatosplenomegaly.  EXTREMITIES:  No clubbing, cyanosis, or edema.  NEUROLOGIC:  Alert and oriented x4.   Cranial nerves II through XII  grossly intact.  Strength and sensation grossly intact.   The patient's white blood cell count was 7.8 with hemoglobin of 15.3,  hematocrit 40.8, platelets 253, segs are 50%.  Sodium 139 with a  potassium of 4.1, chloride 111, bicarb 20, BUN 15, creatinine 1.16,  glucose 146, total bilirubin is 0.6, otherwise normal LFTs.  Cardiac  markers at 1936 and 1652 were unremarkable.  B-type natriuretic peptide  was 112.  Digoxin level was 0.3, D-dimer 0.53.  EKG shows sinus rhythm  with first-degree AV block, left bundle-branch block, and left axis  deviation.   The patient had Mobitz type-I block in addition to the above findings on  EKG in 2007.  No signs of Mobitz now.  He has a significant first degree  AV block, however, in the range of 300 milliseconds, QRS is 162  milliseconds, QT corrected 483.  The patient's CT chest is as above.  Chest x-ray showed no acute cardiopulmonary abnormality.   ASSESSMENT AND PLAN:  This is a patient with a history of possible  atrial fibrillation, left bundle-branch block with first-degree  atrioventricular block, history of Mobitz type I in the past, who now  presents with a smothering sensation, elevated jugular venous distention  after receiving etodolac for his gout.  A concern here is the possible  acute coronary syndrome although given his left bundle-branch block, he  may have significant coronary artery disease and systolic dysfunction.  There is also concern for conduction disease given the left bundle-  branch block, left axis deviation, and a first degree atrioventricular  block.  He may also have significant chronotropic incompetence from this  and unsure if he is having intermittent bradycardia on the metoprolol  and Cardizem that may be symptomatic.  At this time, he will be admitted  to gentle diuresis and Cardiology consult regarding the above problems.  He will be on telemetry and will get cardiac markers x3.   We will  continue  aspirin, metoprolol, Cardizem, and digoxin for now.  Deep vein  thrombosis prophylaxis and gastrointestinal prophylaxis will be ordered.      Darryl D. Prime, MD  Electronically Signed     DDP/MEDQ  D:  08/06/2008  T:  08/07/2008  Job:  [id number]

## 2010-08-31 NOTE — Discharge Summary (Signed)
NAME:  Levi Lewis, Levi Lewis NO.:  [id number]   MEDICAL RECORD NO.:  [id number]          PATIENT TYPE:  INP   LOCATION:  3729                         FACILITY:  [facility]   PHYSICIAN:  Lonia Blood, M.D.DATE OF BIRTH:  05/27/1917   DATE OF ADMISSION:  08/06/2008  DATE OF DISCHARGE:  08/08/2008                               DISCHARGE SUMMARY   PRIMARY CARE PHYSICIAN:  Dr. Elmore Guise.   DISCHARGE DIAGNOSES:  1. Smothering sensation with paroxysmal nocturnal dyspnea and      palpitations.      a.     CT scan of the chest negative for pulmonary embolism.      b.     Echocardiogram revealing septal dyssynergia from left bundle       branch block but good left ventricular function with no major       valvular abnormality.      c.     Nuclear medicine cardiac stress - small focus of potentially       reversible ischemia within the mid and apical segment of the       anterior septal wall.  Septal dyskinesis and anterior wall       hypokinesis.  Left ventricular ejection fraction 43%.  2. Left bundle branch block/first degree atrioventricular block/sinus      bradycardia.      a.     Minimization of negative chronotropic medications.      b.     Inpatient electrophysiology initial evaluation completed per       Dr. Johney Frame.      c.     The patient to follow up with Dr. Myrtis Ser for 21 day event       monitor 05/10.  3. Hypertension.  4. Gout.  5. Hypertriglyceridemia - LDL 113, triglycerides 384, total      cholesterol 221.  6. Nodular opacity along the right major and minor fissures without      air space consolidation with the largest measuring 11 mm -      recommended followup in three months with CT scan of the chest.   DISCHARGE MEDICATIONS:  1. Toprol XL 50 mg p.o. daily.  2. Tricor 48 mg p.o. q.h.s.  3. Aspirin 81 mg p.o. daily.   FOLLOWUP:  1. The patient is advised to follow up with Dr. Jerral Bonito on 05/10 at      11:45 in the morning.  At that time, a 21 day  event recorder will      be placed to further evaluate the patient's possible arrhythmia.  2. The patient is advised to follow up with Dr. Elmore Guise on an as      needed basis but within three months minimum to allow for      scheduling of repeat CT scan of the chest for pulmonary nodules.   CONSULTATIONS:  1. Cardiology - Dr. Willa Rough.  2. Electrophysiology - Dr. Johney Frame.   PROCEDURES:  As per discharge diagnoses above.   HOSPITAL COURSE:  Mr. Levi Lewis is a very pleasant, reasonably healthy  77 year old  gentleman who lives at home with his wife.  He reported to  the hospital with a recent smothering sensation.  He also reported  episodes of sensing palpitations.  At the time of his evaluation, he was  found to be somewhat bradycardic with a pulse rate of 65.  Additionally,  he was found to be in a left bundle branch block, as well as a first  degree AV block.  The patient was admitted to the acute unit.  Cardiac  enzymes were cycled and were unrevealing.  Cardiology was consulted  because of the concern that the patient's smothering sensation could  have been an anginal equivalent.  A nuclear medicine cardiac stress test  was carried out with the results as noted above.  It is felt that the  moderate abnormalities are likely attributable to the patient's left  bundle branch block.  An echocardiogram was also accomplished with the  results as noted above.  Again, the abnormalities were felt to most  likely be attributable to his left bundle branch block.  Given the  evidence of primary AV block, left bundle branch block, and a history of  severe palpitations, concern was raised of a possible tachy-brady type  syndrome.  Electrophysiology was consulted.  The patient's negative  chronotropic medications were either discontinued or significantly  decreased in their dosages.  Ultimately, further inpatient evaluation  for this issue was not felt to be indicated as the patient was felt  to  be most properly managed with an outpatient monitor.  Dr. Myrtis Ser has  agreed to see the patient on the above listed date to schedule a 21 day  outpatient event monitor.   Of note, a CT scan done of the chest done at admission to rule out  pulmonary embolism, while negative for pulmonary embolism, did in fact  reveal evidence of 1 cm or smaller right lung pulmonary nodules.  Aggressive intervention/evaluation is not felt to be indicated at the  present time but follow-up CT scan of the chest in three months is  recommended.   At the present time, the patient is asymptomatic and stable.  His chest  symptoms have completely resolved.  He no longer feels a sense of  shortness of breath or smothering.  He is ambulating in his room without  difficulty.  He is therefore cleared for discharge with the above listed  follow-up recommendations.      Lonia Blood, M.D.  Electronically Signed     JTM/MEDQ  D:  08/08/2008  T:  08/08/2008  Job:  [id number]   cc:   Erskine Speed, M.D.

## 2010-09-03 NOTE — Op Note (Signed)
NAME:  Levi Lewis, Levi Lewis                           ACCOUNT NO.:  [id number]   MEDICAL RECORD NO.:  [id number]                   PATIENT TYPE:  INP   LOCATION:  NA                                   FACILITY:  [facility]   PHYSICIAN:  Hewitt Shorts, M.D.            DATE OF BIRTH:  [birth year]   DATE OF PROCEDURE:  08/05/2002  DATE OF DISCHARGE:                                 OPERATIVE REPORT   PREOPERATIVE DIAGNOSIS:  Lumbar stenosis, lumbar spondylosis, lumbar  degenerative disk disease, and lumbar degenerative spondylolisthesis.   POSTOPERATIVE DIAGNOSIS:  Lumbar stenosis, lumbar spondylosis, lumbar  degenerative disk disease, and lumbar degenerative spondylolisthesis.   PROCEDURE:  L2 to transitional vertebra (four-level) decompressive lumbar  laminectomy with microdissection.   SURGEON:  Hewitt Shorts, M.D.   ASSISTANT:  Danae Orleans. Venetia Maxon, M.D.   ANESTHESIA:  General endotracheal.   INDICATIONS:  The patient is an 77 year old man who had presented with  neurogenic claudication that had steadily worsened and did not respond to  nonsurgical measures.  A decision made to proceed with a decompressive  laminectomy.   DESCRIPTION OF PROCEDURE:  The patient was brought to the operating room and  placed under general endotracheal anesthesia.  The patient was turned to a  prone position.  The lumbar region was prepped with Duraprep and draped in a  sterile fashion.  The midline incision was infiltrated with local anesthetic  with epinephrine, a midline incision made, carried down through the  subcutaneous tissue with bipolar cautery and electrocautery used to maintain  hemostasis.  Dissection was carried down to the lumbar fascia, which was  incised bilaterally and the paraspinal muscles were dissected from the  spinous processes and laminae in a subperiosteal fashion.  An x-ray was  taken for localization, and the L2, L3, L4, and transitional vertebra  spinous processes and  laminae were identified.  We then proceeded with a  decompressive lumbar laminectomy using double-action rongeurs, the [facility] Max  drill, and Kerrison punches.  After the laminae had been thinned with the  [facility] drill, we brought in the operating microscope to provide  additional magnification, illumination, and visualization, and using the  Kerrison punch we carefully removed the remaining thin shell of lamina as  well as the markedly thickened ligamentum flavum at each of the levels.  Care was taken to leave the underlying thecal sac, which was significantly  compressed, undisturbed.  Laminectomies extended laterally so as to  decompress the lateral recesses, and we examined the foramina to assure that  good decompression of the exiting nerve roots was achieved bilaterally at  each level.  Once the decompression was completed, hemostasis was  established with the use of Gelfoam soaked in thrombin, which was left in  the laminectomy defect.  We did take an x-ray to confirm the extent of the  decompression, which included the L2-3, L3-4, and L4/transitional vertebra  levels.  Once the decompression was confirmed and hemostasis established, we  proceeded with closure.  The paraspinal muscles were approximated with  interrupted, undyed 1 Vicryl sutures, the deep  fascia closed with  interrupted, undyed 1 Vicryl sutures, the subcutaneous layer was closed with  interrupted, inverted, undyed 1 Vicryl sutures, and the subcutaneous and  subcuticular layer were closed with interrupted, inverted 2-0 undyed Vicryl  sutures.  The skin was reapproximated with Dermabond.  The patient tolerated  the  procedure well.  The estimated blood loss was 100 mL.  Sponge and needle  count were correct.  Following surgery the patient is to be turned back to a  supine position, reversed from the anesthetic, extubated, and transferred to  the recovery room for further care.                                                Hewitt Shorts, M.D.    RWN/MEDQ  D:  08/05/2002  T:  08/05/2002  Job:  [id number]

## 2010-09-14 ENCOUNTER — Encounter: Payer: Self-pay | Admitting: Cardiology

## 2010-09-14 DIAGNOSIS — R9430 Abnormal result of cardiovascular function study, unspecified: Secondary | ICD-10-CM | POA: Insufficient documentation

## 2010-09-14 DIAGNOSIS — R0602 Shortness of breath: Secondary | ICD-10-CM | POA: Insufficient documentation

## 2010-09-14 DIAGNOSIS — I447 Left bundle-branch block, unspecified: Secondary | ICD-10-CM | POA: Insufficient documentation

## 2010-09-14 DIAGNOSIS — I351 Nonrheumatic aortic (valve) insufficiency: Secondary | ICD-10-CM | POA: Insufficient documentation

## 2010-09-14 DIAGNOSIS — I2510 Atherosclerotic heart disease of native coronary artery without angina pectoris: Secondary | ICD-10-CM | POA: Insufficient documentation

## 2010-09-14 DIAGNOSIS — I340 Nonrheumatic mitral (valve) insufficiency: Secondary | ICD-10-CM | POA: Insufficient documentation

## 2010-09-14 DIAGNOSIS — M109 Gout, unspecified: Secondary | ICD-10-CM | POA: Insufficient documentation

## 2010-09-14 DIAGNOSIS — E785 Hyperlipidemia, unspecified: Secondary | ICD-10-CM | POA: Insufficient documentation

## 2010-09-14 DIAGNOSIS — R918 Other nonspecific abnormal finding of lung field: Secondary | ICD-10-CM | POA: Insufficient documentation

## 2010-09-14 DIAGNOSIS — R002 Palpitations: Secondary | ICD-10-CM | POA: Insufficient documentation

## 2010-09-14 DIAGNOSIS — I10 Essential (primary) hypertension: Secondary | ICD-10-CM | POA: Insufficient documentation

## 2010-09-14 DIAGNOSIS — J449 Chronic obstructive pulmonary disease, unspecified: Secondary | ICD-10-CM | POA: Insufficient documentation

## 2010-09-17 ENCOUNTER — Encounter: Payer: Self-pay | Admitting: Cardiology

## 2010-09-17 ENCOUNTER — Ambulatory Visit (INDEPENDENT_AMBULATORY_CARE_PROVIDER_SITE_OTHER): Payer: Medicare Other | Admitting: Cardiology

## 2010-09-17 DIAGNOSIS — M25519 Pain in unspecified shoulder: Secondary | ICD-10-CM | POA: Insufficient documentation

## 2010-09-17 DIAGNOSIS — I340 Nonrheumatic mitral (valve) insufficiency: Secondary | ICD-10-CM

## 2010-09-17 DIAGNOSIS — I059 Rheumatic mitral valve disease, unspecified: Secondary | ICD-10-CM

## 2010-09-17 DIAGNOSIS — E782 Mixed hyperlipidemia: Secondary | ICD-10-CM

## 2010-09-17 DIAGNOSIS — R0602 Shortness of breath: Secondary | ICD-10-CM

## 2010-09-17 LAB — LIPID PANEL
HDL: 37.4 mg/dL — ABNORMAL LOW (ref >39.00)
Total CHOL/HDL Ratio: 4
Triglycerides: 224 mg/dL — ABNORMAL HIGH (ref 0.0–149.0)
VLDL: 44.8 mg/dL — ABNORMAL HIGH (ref 0.0–40.0)

## 2010-09-17 LAB — BASIC METABOLIC PANEL
CO2: 24 mEq/L (ref 19–32)
Calcium: 9 mg/dL (ref 8.4–10.5)
Chloride: 111 mEq/L (ref 96–112)
Glucose, Bld: 147 mg/dL — ABNORMAL HIGH (ref 70–99)
Potassium: 4.2 mEq/L (ref 3.5–5.1)
Sodium: 142 mEq/L (ref 135–145)

## 2010-09-17 LAB — LDL CHOLESTEROL, DIRECT: Direct LDL: 109.6 mg/dL

## 2010-09-17 NOTE — Assessment & Plan Note (Signed)
This problem is actually his main complaint.  He is stable.  He has most of his symptoms at nighttime.  I agree that trying to avoid surgery would be optimal.  If he were to have unrelenting pain, surgery would not be absolutely contraindicated based on his cardiac status.

## 2010-09-17 NOTE — Progress Notes (Signed)
HPI Patient is seen today to followup shortness of breath and a history of palpitations.  He is actually doing well.  He has some mild chronic shortness of breath but it is stable.There is question that he also might need clearance for shoulder surgery.  Dr.Green has advised him to try to avoid surgery and I certainly agree.  He has not had unstable coronary symptoms.  He has coronary artery calcification by CT but there has never been evidence of a myocardial infarction.  He does have an underlying left bundle branch block.  Nuclear stress study in April, 2010 revealed possible small reversible ischemic area in the mid and apical anteroseptal segments.  He's not having any chest pain. Not on File  Current Outpatient Prescriptions  Medication Sig Dispense Refill  . aspirin 81 MG tablet Take 81 mg by mouth daily.        . fenofibrate (TRICOR) 48 MG tablet 1/2 tab po qd      . furosemide (LASIX) 40 MG tablet Take 1 tab daily       . METOPROLOL SUCCINATE PO Take by mouth. Take 1 tab daily       . Multiple Vitamins-Minerals (CENTRUM SILVER PO) Take by mouth.        Marland Kitchen omeprazole (PRILOSEC) 20 MG capsule Take 20 mg by mouth daily.        Marland Kitchen PRENAT-FECBN-FEBISG-FA-FISHOIL PO Take by mouth.        . Tamsulosin HCl (FLOMAX) 0.4 MG CAPS 1 tab po qd        History   Social History  . Marital Status: Married    Spouse Name: N/A    Number of Children: N/A  . Years of Education: N/A   Occupational History  . Not on file.   Social History Main Topics  . Smoking status: Never Smoker   . Smokeless tobacco: Not on file  . Alcohol Use: Not on file  . Drug Use: Not on file  . Sexually Active: Not on file   Other Topics Concern  . Not on file   Social History Narrative   He has never smoked or drank. No illcit drug use . He is retired from the [employer] and lives with his wife. Illicit Drug Use- no    Family History  Problem Relation Age of Onset  . Heart attack Sister   . Lung cancer  Brother     smoker  . Colon cancer Neg Hx     Past Medical History  Diagnosis Date  . Left bundle branch block     Septal dyssynergy related to this  . CAD (coronary artery disease)     calcifcation by CT scan /  Nuclear, April, 2010, possible small   reversible defects in the mid and apical anteroseptal wall  . Hypertension   . Gout   . Dyslipidemia     Elevated triglycerides  . Palpitations     presumed to be paroxysmal atrial fibrillation but no definite documentation.  . Systolic murmur   . Ejection fraction     EF 45-50%, echo, April, 2010  . Lung nodules     Nodular opacities, chest CT, April, 2010  /  followup CT showed opacities smaller August, 2010  . COPD (chronic obstructive pulmonary disease)   . Shortness of breath     Smothering sensation April, 2010, question mild volume overload, question bradycardia tachycardia component, event recorder and EP assessment-no proven chronotropic incompetence  . Mitral regurgitation  Mild, echo, April, 2010  . Aortic insufficiency     Mild, echo, April, 2010    Past Surgical History  Procedure Date  . Back surgery   . Right shoulder   . Right knee surgery   . Tonsillectomy   . Coronary angioplasty     ROS  Patient denies fever, chills, headache, sweats, or abdomen change in vision, change in hearing, chest pain, cough, nausea vomiting, urinary symptoms.  All other systems are reviewed and are negative other than the history of present illness.  PHYSICAL EXAM Patient is stable today.  He is oriented to person time and place.  Affect is normal.  Head is atraumatic.  There is no jugular venous distention.  Lungs reveal chronic crackles at the right base.  Cardiac exam reveals S1 and S2.  There is a soft systolic murmur.  The abdomen is soft.  There is no peripheral edema. Filed Vitals:   09/17/10 0952  BP: 100/68  Pulse: 68  Resp: 14  Height: 5\' 11"  (1.803 m)  Weight: 181 lb (82.101 kg)    EKG is done today and  reviewed by me.  He is holding sinus rhythm.  He has old left bundle branch block.  ASSESSMENT & PLAN

## 2010-09-17 NOTE — Assessment & Plan Note (Signed)
His shortness of breath is stable.  This is a chronic problem.  No change in therapy.  He is on a diuretic.  We will check chemistry labs today.

## 2010-09-17 NOTE — Patient Instructions (Signed)
Labs today (bmet, lipid) Your physician wants you to follow-up in: 6 months.  You will receive a reminder letter in the mail two months in advance. If you don't receive a letter, please call our office to schedule the follow-up appointment.

## 2010-09-17 NOTE — Assessment & Plan Note (Signed)
Valvular heart disease is mild.  He does not need any further evaluation.

## 2010-09-24 ENCOUNTER — Telehealth: Payer: Self-pay | Admitting: Cardiology

## 2010-09-24 NOTE — Telephone Encounter (Signed)
Returning call back . Test results

## 2010-09-24 NOTE — Telephone Encounter (Signed)
Mr cosma calling back for lab results and advised lab drawn before shows BUN,  CREATININE a little elevated and dr Myrtis Ser would like you to decrease lasix to 20mg  qd and repeat lab in 3 weeks--pt agrees and lab ordered, also sent results and plan to dr green---nt

## 2010-09-30 ENCOUNTER — Emergency Department (HOSPITAL_COMMUNITY)
Admission: EM | Admit: 2010-09-30 | Discharge: 2010-09-30 | Disposition: A | Discharge status: Home / Self Care | Payer: Medicare Other | Attending: Emergency Medicine | Admitting: Emergency Medicine

## 2010-09-30 ENCOUNTER — Emergency Department (HOSPITAL_COMMUNITY): Payer: Medicare Other

## 2010-09-30 DIAGNOSIS — R093 Abnormal sputum: Secondary | ICD-10-CM | POA: Insufficient documentation

## 2010-09-30 DIAGNOSIS — I499 Cardiac arrhythmia, unspecified: Secondary | ICD-10-CM | POA: Insufficient documentation

## 2010-09-30 DIAGNOSIS — Z8639 Personal history of other endocrine, nutritional and metabolic disease: Secondary | ICD-10-CM | POA: Insufficient documentation

## 2010-09-30 DIAGNOSIS — R0602 Shortness of breath: Secondary | ICD-10-CM | POA: Insufficient documentation

## 2010-09-30 DIAGNOSIS — R059 Cough, unspecified: Secondary | ICD-10-CM | POA: Insufficient documentation

## 2010-09-30 DIAGNOSIS — Z862 Personal history of diseases of the blood and blood-forming organs and certain disorders involving the immune mechanism: Secondary | ICD-10-CM | POA: Insufficient documentation

## 2010-09-30 DIAGNOSIS — I4891 Unspecified atrial fibrillation: Secondary | ICD-10-CM | POA: Insufficient documentation

## 2010-09-30 DIAGNOSIS — R0601 Orthopnea: Secondary | ICD-10-CM | POA: Insufficient documentation

## 2010-09-30 DIAGNOSIS — R05 Cough: Secondary | ICD-10-CM | POA: Insufficient documentation

## 2010-09-30 DIAGNOSIS — I10 Essential (primary) hypertension: Secondary | ICD-10-CM | POA: Insufficient documentation

## 2010-09-30 DIAGNOSIS — I509 Heart failure, unspecified: Secondary | ICD-10-CM | POA: Insufficient documentation

## 2010-09-30 LAB — DIFFERENTIAL
Basophils Relative: 0 % (ref 0–1)
Eosinophils Absolute: 0.2 10*3/uL (ref 0.0–0.7)
Eosinophils Relative: 2 % (ref 0–5)
Lymphs Abs: 2.5 10*3/uL (ref 0.7–4.0)
Neutrophils Relative %: 55 % (ref 43–77)

## 2010-09-30 LAB — CBC
HCT: 38.4 % — ABNORMAL LOW (ref 39.0–52.0)
Hemoglobin: 13.5 g/dL (ref 13.0–17.0)
MCH: 31.6 pg (ref 26.0–34.0)
MCHC: 35.2 g/dL (ref 30.0–36.0)
MCV: 89.9 fL (ref 78.0–100.0)
Platelets: 229 10*3/uL (ref 150–400)
RBC: 4.27 MIL/uL (ref 4.22–5.81)
RDW: 12.9 % (ref 11.5–15.5)
WBC: 7.3 10*3/uL (ref 4.0–10.5)

## 2010-09-30 LAB — CK TOTAL AND CKMB (NOT AT ARMC)
CK, MB: 2.7 ng/mL (ref 0.3–4.0)
Relative Index: INVALID (ref 0.0–2.5)

## 2010-09-30 LAB — BASIC METABOLIC PANEL
BUN: 21 mg/dL (ref 6–23)
Chloride: 107 mEq/L (ref 96–112)
Creatinine, Ser: 1.16 mg/dL (ref 0.4–1.5)
GFR calc Af Amer: >60 mL/min (ref >60)
Glucose, Bld: 147 mg/dL — ABNORMAL HIGH (ref 70–99)

## 2010-09-30 LAB — PRO B NATRIURETIC PEPTIDE: Pro B Natriuretic peptide (BNP): 838.7 pg/mL — ABNORMAL HIGH (ref 0–450)

## 2010-09-30 LAB — APTT: aPTT: 32 seconds (ref 24–37)

## 2010-10-08 ENCOUNTER — Other Ambulatory Visit (INDEPENDENT_AMBULATORY_CARE_PROVIDER_SITE_OTHER): Payer: Medicare Other | Admitting: *Deleted

## 2010-10-08 ENCOUNTER — Telehealth: Payer: Self-pay | Admitting: *Deleted

## 2010-10-08 DIAGNOSIS — I253 Aneurysm of heart: Secondary | ICD-10-CM

## 2010-10-08 LAB — BASIC METABOLIC PANEL
CO2: 21 mEq/L (ref 19–32)
Calcium: 8.6 mg/dL (ref 8.4–10.5)
Chloride: 108 mEq/L (ref 96–112)
Glucose, Bld: 135 mg/dL — ABNORMAL HIGH (ref 70–99)
Sodium: 139 mEq/L (ref 135–145)

## 2010-10-08 NOTE — Telephone Encounter (Signed)
Labs back(BMET)--look good--reviewed with dr Clifton James --called pt and advised--please take an xtra 20mg  lasix the next 2 days and f/u with dr green on Monday--do not take anymore antibiotics until you check with dr green--pt agrees--nt

## 2010-10-08 NOTE — Telephone Encounter (Signed)
Pt walked in for lab work ordered 09/24/10--when in lab pt c/o SOB and inability to sleep at noc--i spoke with dr mcalhany(DOD) and dr Clifton James   advised we will get results blood work this p m and depending on results will advise pt what next step will be--pt did go into ED  2 days ago with SOB and his  pcp (dr green) put him on antibiotic which caused heart palps and sleeplessness and when they called dr green post ED with pt's reaction dr green d/ced antibiotic--will wait for lab results this pm and call pt with instructions--pt agrees--nt

## 2010-10-08 NOTE — Telephone Encounter (Signed)
Agree. cdm 

## 2010-10-14 NOTE — Telephone Encounter (Signed)
agree

## 2010-11-18 ENCOUNTER — Telehealth: Payer: Self-pay | Admitting: Cardiology

## 2010-11-18 NOTE — Telephone Encounter (Signed)
Pt could not remember that he was seen in 09/17/10 in Dr. Henrietta Hoover office with a F/U visit in 6 months. An appointment was made for pt in 03/22/11 at 2:00 PM, patient aware.

## 2010-11-18 NOTE — Telephone Encounter (Signed)
Pt not having any problems but he got a letter for his 6mon f/u in Nov and I tried to schedule him for 12/4 and explained he was last seen 09/17/10 and he says he was not seen then and i explained that was what was in the system and was he having problems and needed to be seen sooner he said no. I didn't know what else to do.

## 2011-02-22 ENCOUNTER — Other Ambulatory Visit: Payer: Self-pay | Admitting: Internal Medicine

## 2011-03-03 ENCOUNTER — Other Ambulatory Visit: Payer: Self-pay | Admitting: Cardiology

## 2011-03-22 ENCOUNTER — Ambulatory Visit (INDEPENDENT_AMBULATORY_CARE_PROVIDER_SITE_OTHER): Payer: Medicare Other | Admitting: Cardiology

## 2011-03-22 ENCOUNTER — Encounter: Payer: Self-pay | Admitting: Cardiology

## 2011-03-22 DIAGNOSIS — R0602 Shortness of breath: Secondary | ICD-10-CM

## 2011-03-22 DIAGNOSIS — I359 Nonrheumatic aortic valve disorder, unspecified: Secondary | ICD-10-CM

## 2011-03-22 DIAGNOSIS — M25519 Pain in unspecified shoulder: Secondary | ICD-10-CM

## 2011-03-22 DIAGNOSIS — R002 Palpitations: Secondary | ICD-10-CM

## 2011-03-22 DIAGNOSIS — I447 Left bundle-branch block, unspecified: Secondary | ICD-10-CM

## 2011-03-22 DIAGNOSIS — I351 Nonrheumatic aortic (valve) insufficiency: Secondary | ICD-10-CM

## 2011-03-22 NOTE — Assessment & Plan Note (Signed)
He has mild aortic insufficiency and mild mitral regurgitation.  He does not need a followup echo at this time.

## 2011-03-22 NOTE — Assessment & Plan Note (Signed)
The shortness of breath is stable.  He continues on a small dose of diuretic.  No change in therapy.

## 2011-03-22 NOTE — Assessment & Plan Note (Signed)
Left bundle branch block is old. No change in therapy. 

## 2011-03-22 NOTE — Assessment & Plan Note (Signed)
Shoulder discomfort is musculoskeletal.  There is not a cardiac component.

## 2011-03-22 NOTE — Progress Notes (Signed)
HPI  Patient is seen today to followup shortness of breath and palpitations.  He is actually doing well.  He has some ongoing discomfort with the shoulder that is being managed by Dr. Chilton Si.  Is not having any new changes in his baseline mild shortness of breath.  He has rare palpitations but this is stable.  No Known Allergies  Current Outpatient Prescriptions  Medication Sig Dispense Refill  . aspirin 81 MG tablet Take 81 mg by mouth daily.        . fenofibrate (TRICOR) 48 MG tablet 1/2 tab po qd      . fish oil-omega-3 fatty acids 1000 MG capsule Take 2 g by mouth daily.        . furosemide (LASIX) 40 MG tablet Take 1 tab daily       . metoprolol (TOPROL-XL) 50 MG 24 hr tablet TAKE ONE TABLET BY MOUTH DAILY  90 tablet  2  . Multiple Vitamins-Minerals (CENTRUM SILVER PO) Take by mouth.        . Multiple Vitamins-Minerals (OCUVITE PO) Take by mouth daily.        Marland Kitchen omeprazole (PRILOSEC) 20 MG capsule Take 20 mg by mouth daily.        . Tamsulosin HCl (FLOMAX) 0.4 MG CAPS 1 tab po qd        History   Social History  . Marital Status: Married    Spouse Name: N/A    Number of Children: N/A  . Years of Education: N/A   Occupational History  . Not on file.   Social History Main Topics  . Smoking status: Never Smoker   . Smokeless tobacco: Not on file  . Alcohol Use: Not on file  . Drug Use: Not on file  . Sexually Active: Not on file   Other Topics Concern  . Not on file   Social History Narrative   He has never smoked or drank. No illcit drug use . He is retired from the [employer] and lives with his wife. Illicit Drug Use- no    Family History  Problem Relation Age of Onset  . Heart attack Sister   . Lung cancer Brother     smoker  . Colon cancer Neg Hx     Past Medical History  Diagnosis Date  . Left bundle branch block     Septal dyssynergy related to this  . CAD (coronary artery disease)     calcifcation by CT scan /  Nuclear, April, 2010, possible small    reversible defects in the mid and apical anteroseptal wall  . Hypertension   . Gout   . Dyslipidemia     Elevated triglycerides  . Palpitations     presumed to be paroxysmal atrial fibrillation but no definite documentation.  . Systolic murmur   . Ejection fraction     EF 45-50%, echo, April, 2010  . Lung nodules     Nodular opacities, chest CT, April, 2010  /  followup CT showed opacities smaller August, 2010  . COPD (chronic obstructive pulmonary disease)   . Shortness of breath     Smothering sensation April, 2010, question mild volume overload, question bradycardia tachycardia component, event recorder and EP assessment-no proven chronotropic incompetence  . Mitral regurgitation     Mild, echo, April, 2010  . Aortic insufficiency     Mild, echo, April, 2010  . Shoulder pain     Left rotator cuff tear    Past Surgical History  Procedure Date  . Back surgery   . Right shoulder   . Right knee surgery   . Tonsillectomy   . Coronary angioplasty     ROS    Patient denies fever, chills, headache, sweats, rash, change in vision, change in hearing, chest pain, cough, nausea vomiting, urinary symptoms.  All of the systems are reviewed and are negative.  PHYSICAL EXAM  He is stable today.  He is oriented to person time and place.  Affect is normal.  There is no jugular venous distention.  Eyes are clear.  Respiratory effort is nonlabored.  Cardiac exam reveals an S1-S2.  There is a soft systolic murmur.  The abdomen is soft.  There is no peripheral edema.  Filed Vitals:   03/22/11 0847  BP: 122/64  Pulse: 77  Height: 5\' 11"  (1.803 m)  Weight: 180 lb (81.647 kg)    EKG  EKG is done today and reviewed by me.  He has left lumbar branch block that is old.  There is also sinus rhythm.  There is no significant change.  ASSESSMENT & PLAN

## 2011-03-22 NOTE — Assessment & Plan Note (Signed)
He has mild chronic palpitations but no prolonged palpitations.  No further workup is needed.

## 2011-03-22 NOTE — Patient Instructions (Signed)
Your physician wants you to follow-up in:  6 months. You will receive a reminder letter in the mail two months in advance. If you don't receive a letter, please call our office to schedule the follow-up appointment.   

## 2011-08-08 ENCOUNTER — Telehealth: Payer: Self-pay | Admitting: Cardiology

## 2011-08-08 NOTE — Telephone Encounter (Signed)
Pt was notified and agrees. 

## 2011-08-08 NOTE — Telephone Encounter (Signed)
Pt is calling with concerns about weight gain.  He states he has gained 5# in the past 4 days.  He denies increased sob, no pain and only one dizzy spell a few nights ago that he states is not unusual.  BP is 124/70.  He states he started taking etodolac and doxycycline for an infection in his knee and toe 5 days ago after developing swelling in his knee and toe.  The swelling is mostly gone now.

## 2011-08-08 NOTE — Telephone Encounter (Signed)
New msg Pt said he has gained 5 lbs in last three days. He takes furosemide. Please call him back

## 2011-08-08 NOTE — Telephone Encounter (Signed)
Per Dr [place] (dod), pt to monitor weight and call back if it continues to increase or he becomes symptomatic.

## 2011-09-07 ENCOUNTER — Other Ambulatory Visit: Payer: Self-pay | Admitting: Cardiology

## 2011-09-11 ENCOUNTER — Other Ambulatory Visit: Payer: Self-pay | Admitting: Cardiology

## 2011-09-21 ENCOUNTER — Ambulatory Visit (INDEPENDENT_AMBULATORY_CARE_PROVIDER_SITE_OTHER): Payer: Medicare Other | Admitting: Cardiology

## 2011-09-21 ENCOUNTER — Telehealth: Payer: Self-pay | Admitting: Cardiology

## 2011-09-21 ENCOUNTER — Encounter: Payer: Self-pay | Admitting: Cardiology

## 2011-09-21 VITALS — BP 128/70 | HR 68 | Ht 71.0 in | Wt 182.0 lb

## 2011-09-21 DIAGNOSIS — R0602 Shortness of breath: Secondary | ICD-10-CM

## 2011-09-21 DIAGNOSIS — I10 Essential (primary) hypertension: Secondary | ICD-10-CM

## 2011-09-21 DIAGNOSIS — I447 Left bundle-branch block, unspecified: Secondary | ICD-10-CM

## 2011-09-21 DIAGNOSIS — R002 Palpitations: Secondary | ICD-10-CM

## 2011-09-21 MED ORDER — DIPHENHYDRAMINE HCL 25 MG PO TABS: 25.0000 mg | ORAL_TABLET | Freq: Every evening | ORAL | Status: DC | PRN

## 2011-09-21 NOTE — Assessment & Plan Note (Signed)
His volume status appears to be stable.  No change in therapy. 

## 2011-09-21 NOTE — Assessment & Plan Note (Signed)
He's not having any significant palpitations. No change in therapy. 

## 2011-09-21 NOTE — Telephone Encounter (Signed)
FYI:   Patient calling back to report requested Medication information.  Patient currently taking Sleep aid med DIPHENHYDRAMINE 25gm, Calcium 15mg .  Patient can be reached at [phone] for additional information.

## 2011-09-21 NOTE — Patient Instructions (Signed)
Your physician wants you to follow-up in: 6 months.   You will receive a reminder letter in the mail two months in advance. If you don't receive a letter, please call our office to schedule the follow-up appointment.  Please call us at [phone] with the name of the sleep medication that you have at home and want to take.

## 2011-09-21 NOTE — Assessment & Plan Note (Signed)
Left bundle branch block is old. No further workup. 

## 2011-09-21 NOTE — Telephone Encounter (Signed)
Dr Myrtis Ser was notified and med list was updated with this information.

## 2011-09-21 NOTE — Progress Notes (Signed)
OBJECTIVE:  HPI Patient is seen today for cardiology followup. He's had some palpitations over time by history but has been stable recently. He's also had some mild shortness of breath at time and we thought was mild fluid overload. Recently he had some swelling in his left knee. This is improved. He's having some difficulty sleeping.  No Known Allergies  Current Outpatient Prescriptions  Medication Sig Dispense Refill  . aspirin 81 MG tablet Take 81 mg by mouth daily.        . fenofibrate (TRICOR) 48 MG tablet Take 0.5 tablets (24 mg total) by mouth daily.  45 tablet  2  . fish oil-omega-3 fatty acids 1000 MG capsule Take 2 g by mouth daily.        . furosemide (LASIX) 40 MG tablet TAKE ONE TABLET BY MOUTH DAILY.  90 tablet  2  . metoprolol (TOPROL-XL) 50 MG 24 hr tablet TAKE ONE TABLET BY MOUTH DAILY  90 tablet  2  . Multiple Vitamins-Minerals (CENTRUM SILVER PO) Take by mouth.        . Multiple Vitamins-Minerals (OCUVITE PO) Take by mouth daily.        Marland Kitchen omeprazole (PRILOSEC) 20 MG capsule Take 20 mg by mouth daily.        . Tamsulosin HCl (FLOMAX) 0.4 MG CAPS 1 tab po qd        History   Social History  . Marital Status: Married    Spouse Name: N/A    Number of Children: N/A  . Years of Education: N/A   Occupational History  . Not on file.   Social History Main Topics  . Smoking status: Never Smoker   . Smokeless tobacco: Not on file  . Alcohol Use: Not on file  . Drug Use: Not on file  . Sexually Active: Not on file   Other Topics Concern  . Not on file   Social History Narrative   He has never smoked or drank. No illcit drug use . He is retired from the [employer] and lives with his wife. Illicit Drug Use- no    Family History  Problem Relation Age of Onset  . Heart attack Sister   . Lung cancer Brother     smoker  . Colon cancer Neg Hx     Past Medical History  Diagnosis Date  . Left bundle branch block     Septal dyssynergy related to this  . CAD  (coronary artery disease)     calcifcation by CT scan /  Nuclear, April, 2010, possible small   reversible defects in the mid and apical anteroseptal wall  . Hypertension   . Gout   . Dyslipidemia     Elevated triglycerides  . Palpitations     presumed to be paroxysmal atrial fibrillation but no definite documentation.  . Systolic murmur   . Ejection fraction     EF 45-50%, echo, April, 2010  . Lung nodules     Nodular opacities, chest CT, April, 2010  /  followup CT showed opacities smaller August, 2010  . COPD (chronic obstructive pulmonary disease)   . Shortness of breath     Smothering sensation April, 2010, question mild volume overload, question bradycardia tachycardia component, event recorder and EP assessment-no proven chronotropic incompetence  . Mitral regurgitation     Mild, echo, April, 2010  . Aortic insufficiency     Mild, echo, April, 2010  . Shoulder pain     Left rotator cuff  tear    Past Surgical History  Procedure Date  . Back surgery   . Right shoulder   . Right knee surgery   . Tonsillectomy   . Coronary angioplasty     ROS  Patient denies fever, chills, headache, sweats, rash, change in vision, change in hearing, chest pain, cough, nausea vomiting, urinary symptoms. All other systems are reviewed and are negative.  PHYSICAL EXAM  Patient is stable today. He is oriented to person time and place. Affect is normal. There is no jugulovenous distention. Lungs are clear. Respiratory effort is nonlabored. Cardiac exam reveals S1 and S2. There no clicks or significant murmurs. The abdomen is soft. There is no significant peripheral edema.  Filed Vitals:   09/21/11 0951  BP: 128/70  Pulse: 68  Height: 5\' 11"  (1.803 m)  Weight: 182 lb (82.555 kg)   EKG is done today. There is sinus rhythm. There is old left bundle branch block.  ASSESSMENT & PLAN

## 2011-09-21 NOTE — Assessment & Plan Note (Signed)
Blood pressure is well controlled. No change in therapy. 

## 2011-11-24 ENCOUNTER — Other Ambulatory Visit: Payer: Self-pay | Admitting: Cardiology

## 2011-12-20 ENCOUNTER — Emergency Department (HOSPITAL_COMMUNITY)
Admission: EM | Admit: 2011-12-20 | Discharge: 2011-12-20 | Disposition: A | Discharge status: Home / Self Care | Payer: Medicare Other | Attending: Emergency Medicine | Admitting: Emergency Medicine

## 2011-12-20 ENCOUNTER — Other Ambulatory Visit: Payer: Self-pay

## 2011-12-20 ENCOUNTER — Emergency Department (HOSPITAL_COMMUNITY): Payer: Medicare Other

## 2011-12-20 ENCOUNTER — Encounter (HOSPITAL_COMMUNITY): Payer: Self-pay | Admitting: Emergency Medicine

## 2011-12-20 DIAGNOSIS — R059 Cough, unspecified: Secondary | ICD-10-CM | POA: Insufficient documentation

## 2011-12-20 DIAGNOSIS — M7989 Other specified soft tissue disorders: Secondary | ICD-10-CM | POA: Insufficient documentation

## 2011-12-20 DIAGNOSIS — R0602 Shortness of breath: Secondary | ICD-10-CM

## 2011-12-20 DIAGNOSIS — R5381 Other malaise: Secondary | ICD-10-CM | POA: Insufficient documentation

## 2011-12-20 DIAGNOSIS — R002 Palpitations: Secondary | ICD-10-CM

## 2011-12-20 DIAGNOSIS — R109 Unspecified abdominal pain: Secondary | ICD-10-CM | POA: Insufficient documentation

## 2011-12-20 DIAGNOSIS — I10 Essential (primary) hypertension: Secondary | ICD-10-CM | POA: Insufficient documentation

## 2011-12-20 DIAGNOSIS — R05 Cough: Secondary | ICD-10-CM | POA: Insufficient documentation

## 2011-12-20 DIAGNOSIS — N289 Disorder of kidney and ureter, unspecified: Secondary | ICD-10-CM | POA: Insufficient documentation

## 2011-12-20 DIAGNOSIS — R110 Nausea: Secondary | ICD-10-CM | POA: Insufficient documentation

## 2011-12-20 DIAGNOSIS — Z79899 Other long term (current) drug therapy: Secondary | ICD-10-CM | POA: Insufficient documentation

## 2011-12-20 DIAGNOSIS — J449 Chronic obstructive pulmonary disease, unspecified: Secondary | ICD-10-CM | POA: Insufficient documentation

## 2011-12-20 DIAGNOSIS — R7989 Other specified abnormal findings of blood chemistry: Secondary | ICD-10-CM

## 2011-12-20 DIAGNOSIS — M109 Gout, unspecified: Secondary | ICD-10-CM | POA: Insufficient documentation

## 2011-12-20 DIAGNOSIS — J4489 Other specified chronic obstructive pulmonary disease: Secondary | ICD-10-CM | POA: Insufficient documentation

## 2011-12-20 DIAGNOSIS — R791 Abnormal coagulation profile: Secondary | ICD-10-CM | POA: Insufficient documentation

## 2011-12-20 LAB — COMPREHENSIVE METABOLIC PANEL
Albumin: 3.5 g/dL (ref 3.5–5.2)
Alkaline Phosphatase: 63 U/L (ref 39–117)
BUN: 33 mg/dL — ABNORMAL HIGH (ref 6–23)
Calcium: 9.4 mg/dL (ref 8.4–10.5)
Creatinine, Ser: 1.47 mg/dL — ABNORMAL HIGH (ref 0.50–1.35)
GFR calc Af Amer: 46 mL/min — ABNORMAL LOW (ref >90)
Glucose, Bld: 166 mg/dL — ABNORMAL HIGH (ref 70–99)
Potassium: 4.1 mEq/L (ref 3.5–5.1)
Total Protein: 7.3 g/dL (ref 6.0–8.3)

## 2011-12-20 LAB — CBC WITH DIFFERENTIAL/PLATELET
Basophils Relative: 0 % (ref 0–1)
Eosinophils Absolute: 0.1 10*3/uL (ref 0.0–0.7)
Eosinophils Relative: 1 % (ref 0–5)
HCT: 39 % (ref 39.0–52.0)
Hemoglobin: 13.3 g/dL (ref 13.0–17.0)
Lymphs Abs: 2.9 10*3/uL (ref 0.7–4.0)
MCH: 31.1 pg (ref 26.0–34.0)
MCHC: 34.1 g/dL (ref 30.0–36.0)
MCV: 91.1 fL (ref 78.0–100.0)
Monocytes Absolute: 0.5 10*3/uL (ref 0.1–1.0)
Monocytes Relative: 6 % (ref 3–12)
Neutrophils Relative %: 62 % (ref 43–77)
RBC: 4.28 MIL/uL (ref 4.22–5.81)

## 2011-12-20 LAB — URINALYSIS, ROUTINE W REFLEX MICROSCOPIC
Glucose, UA: NEGATIVE mg/dL
Ketones, ur: NEGATIVE mg/dL
Leukocytes, UA: NEGATIVE
Nitrite: NEGATIVE
pH: 5 (ref 5.0–8.0)

## 2011-12-20 LAB — TROPONIN I: Troponin I: <0.3 ng/mL (ref <0.30)

## 2011-12-20 LAB — PRO B NATRIURETIC PEPTIDE: Pro B Natriuretic peptide (BNP): 5845 pg/mL — ABNORMAL HIGH (ref 0–450)

## 2011-12-20 MED ORDER — ASPIRIN 81 MG PO CHEW
324.0000 mg | CHEWABLE_TABLET | Freq: Once | ORAL | Status: AC
  Administered 2011-12-20: 324 mg via ORAL
  Filled 2011-12-20: qty 4

## 2011-12-20 MED ORDER — ONDANSETRON HCL 4 MG/2ML IJ SOLN
4.0000 mg | Freq: Once | INTRAMUSCULAR | Status: AC
  Administered 2011-12-20: 4 mg via INTRAVENOUS
  Filled 2011-12-20: qty 2

## 2011-12-20 MED ORDER — FUROSEMIDE 10 MG/ML IJ SOLN
60.0000 mg | Freq: Once | INTRAMUSCULAR | Status: AC
  Administered 2011-12-20: 60 mg via INTRAVENOUS
  Filled 2011-12-20: qty 6

## 2011-12-20 MED ORDER — XENON XE 133 GAS
20.0000 | GAS_FOR_INHALATION | Freq: Once | RESPIRATORY_TRACT | Status: AC | PRN
  Administered 2011-12-20: 20 via RESPIRATORY_TRACT

## 2011-12-20 MED ORDER — TECHNETIUM TO 99M ALBUMIN AGGREGATED
3.0000 | Freq: Once | INTRAVENOUS | Status: AC | PRN
  Administered 2011-12-20: 3 via INTRAVENOUS

## 2011-12-20 NOTE — Consult Note (Signed)
Consult Note  Patient ID: Levi Lewis MRN: [id number], SOB: [birth year] 78 y.o. Date of Encounter: 12/20/2011, 2:18 PM  Primary Physician: Enrique Sack, MD Primary Cardiologist: Dr. Myrtis Ser  Chief Complaint: shortness of breath, palpitations  HPI: 78 y.o. male w/ PMHx significant for CAD (calcification by CT scan/Myoview 2010 low risk), Cardiomyopathy (EF 45-50%), HTN, Hypertriglyceridemia, Palpitations (?PAF), LBBB, mild MR/AI, CKD stage 3, and COPD who presented to [facility] on 12/20/2011 with complaints of shortness of breath and palpitations.  Patient has a history of chronic shortness of breath and palpitations for which he is followed by Dr. Myrtis Ser. Last echo 2010 revealed EF 45-50%, mild to mod LVH, septal motion consistent with LBBB, mild AI//MR. Myoview 2010 revealed small potential reversible ischemia within the mid and apical segment of the anteroseptal wall, possibly artifact from LBBB, EF 43.%. He has had frequent palpitations in the past for which he has worn an event monitor and has presumed PAF, but no definite documentation. He reports having left knee swelling a few weeks ago for which his PCP placed him on "gout medicine". He then had ankle swelling and blisters on his foot for which he was placed on antibiotics. Since then has had increased "irregular heart beats" and mild worsening shortness of breath. Reports his breathing feels smothered and improves with walking around outside. Salts foods. Ate hot dogs over the weekend. Denies orthopnea, edema, or weight gain (normally weighs ~182). Has mild productive cough x 2-3wks, no hemoptysis. No fever, chills, chest pain, syncope, change in bladder/bowel habits, prolonged immobility/travel or recent surgery. He is fairly active and able to walk and do ADLs without chest pain.  EKG reveals sinus rhythm w/ 1st degree AVB, 82bpm, RBBB, PVCs, no acute ischemic changes. cardiopulmonary abnormalities. Labs are significant for normal  troponin x1, pBNP 5845, DDimer 0.90, BUN/Crt 33/1.47, otherwise unremarkable CMET/CBC/UA. Vital signs are stable. Not hypoxic, tachycardic, or tachypneic. Received 60mg  IV lasix, 324mg  ASA, and Zofran. Resting comfortably with improved breathing.   Past Medical History  Diagnosis Date  . Left bundle branch block     Septal dyssynergy related to this  . CAD (coronary artery disease)     calcifcation by CT scan /  Nuclear, April, 2010, possible small   reversible defects in the mid and apical anteroseptal wall  . Hypertension   . Gout   . Dyslipidemia     Elevated triglycerides  . Palpitations     presumed to be paroxysmal atrial fibrillation but no definite documentation.  . Systolic murmur   . Ejection fraction     EF 45-50%, echo, April, 2010  . Lung nodules     Nodular opacities, chest CT, April, 2010  /  followup CT showed opacities smaller August, 2010  . COPD (chronic obstructive pulmonary disease)   . Shortness of breath     Smothering sensation April, 2010, question mild volume overload, question bradycardia tachycardia component, event recorder and EP assessment-no proven chronotropic incompetence  . Mitral regurgitation     Mild, echo, April, 2010  . Aortic insufficiency     Mild, echo, April, 2010  . Shoulder pain     Left rotator cuff tear    08/07/08 - Study Conclusions 1. Left ventricle: The cavity size was normal. Wall thickness was increased increased in a pattern of mild to moderate LVH. There was concentric hypertrophy. Systolic function was mildly reduced. The estimated ejection fraction was in the range of 45% to 50%. 2. Ventricular septum: Septal  motion showed severe paradox. These changes are consistent with a left bundle branch block. 3. Aortic valve: Mild regurgitation. 4. Mitral valve: Mild regurgitation.  08/08/08 - Myoview Findings: Technique: Study is adequate. The patient exercised for 6 minutes achieving target heart rate. No specific EKG  changes. Perfusion: There is a small focus of mild reversibility within the apical and mid segments of the anterior septal wall. No additional areas of decreased counts to suggest reversible ischemia infarction. Mild decreased counts in the inferior wall are likely secondary to diaphragmatic attenuation.  Wall motion: There is dyskinesis of the septum. There is also hypokinesis of the anterior wall septum.  Left ventricular ejection fraction: Calculated left ventricular ejection fraction = 43 %  IMPRESSION: 1. Small focus of potential reversible ischemia within the mid and apical segment of the anteroseptal wall. Of note, artifactual septal perfusion defects may be noted with left bundle branch block. 2. Septal dyskinesis and anterior wall hypokinesis. Dyskinesis may in part related to the left bundle branch block.  3. Left ventricular ejection fraction equal 43%.   Surgical History:  Past Surgical History  Procedure Date  . Back surgery   . Right shoulder   . Right knee surgery   . Tonsillectomy   . Coronary angioplasty      Home Meds: Medication Sig  aspirin 81 MG tablet Take 81 mg by mouth daily.    fenofibrate (TRICOR) 48 MG tablet Take 0.5 tablets (24 mg total) by mouth daily.  fish oil-omega-3 fatty acids 1000 MG capsule Take 2 g by mouth daily.    furosemide (LASIX) 40 MG tablet Take 40 mg by mouth daily.  metoprolol succinate (TOPROL-XL) 50 MG 24 hr tablet Take 50 mg by mouth daily. Take with or immediately following a meal.  Multiple Vitamins-Minerals (CENTRUM SILVER PO) Take 1 tablet by mouth daily.   Multiple Vitamins-Minerals (OCUVITE PO) Take 1 tablet by mouth daily.   omeprazole (PRILOSEC) 20 MG capsule Take 20 mg by mouth daily.    Tamsulosin HCl (FLOMAX) 0.4 MG CAPS Take 0.4 mg by mouth daily. 1 tab po qd    Allergies: No Known Allergies  History   Social History  . Marital Status: Married    Spouse Name: N/A    Number of Children: N/A  . Years of Education:  N/A   Occupational History  . Not on file.   Social History Main Topics  . Smoking status: Never Smoker   . Smokeless tobacco: Not on file  . Alcohol Use: Not on file  . Drug Use: Not on file  . Sexually Active: Not on file   Other Topics Concern  . Not on file   Social History Narrative   He has never smoked or drank. No illcit drug use . He is retired from the [employer] and lives with his wife. Illicit Drug Use- no     Family History  Problem Relation Age of Onset  . Heart attack Sister   . Lung cancer Brother     smoker  . Colon cancer Neg Hx     Review of Systems: General: negative for chills, fever, night sweats or weight changes.  Cardiovascular: As per HPI Dermatological: negative for rash Respiratory: negative for wheezing Urologic: (+) mild productive cough; negative for hematuria Abdominal: negative for nausea, vomiting, diarrhea, bright red blood per rectum, melena, or hematemesis Neurologic: negative for visual changes, syncope, or dizziness All other systems reviewed and are otherwise negative except as noted above.  Labs:  Component Value Date   WBC 9.1 12/20/2011   HGB 13.3 12/20/2011   HCT 39.0 12/20/2011   MCV 91.1 12/20/2011   PLT 300 12/20/2011    Lab 12/20/11 1137  NA 137  K 4.1  CL 102  CO2 19  BUN 33*  CREATININE 1.47*  CALCIUM 9.4  PROT 7.3  BILITOT 0.4  ALKPHOS 63  ALT 10  AST 17  GLUCOSE 166*   Basename 12/20/11 1137  TROPONINI <0.30   Lab Results  Component Value Date   DDIMER 0.90* 12/20/2011    Radiology/Studies:   12/20/2011 - Chest 2 View  Findings: The cardiac silhouette, mediastinal and hilar contours are stable.  Stable linear scarring changes.  No infiltrates, edema or effusions.  The bony thorax is intact.  IMPRESSION: No acute cardiopulmonary findings.        EKG: 12/20/11 @ 1110 - sinus rhythm w/ 1st degree AVB, 82bpm, RBBB, PVCs, no acute ischemic changes.  Tele: sinus rhythm 1st degree AVB with frequent PVCs, no  A.Fib  Physical Exam: Blood pressure 120/62, pulse 67, resp. rate 20, SpO2 92.00%. General: Pleasant elderly white male in no acute distress. Head: Normocephalic, atraumatic, sclera non-icteric, nares are without discharge Neck: Supple. Negative for carotid bruits. JVD not elevated. Lungs: Fine bibasilar rales - Right > left. No wheezes or rhonchi. Breathing is unlabored. Heart: RRR with S1 S2, frequent ectopic beats. No murmurs, rubs, or gallops appreciated. Abdomen: Soft, non-tender, non-distended with normoactive bowel sounds. No rebound/guarding. No obvious abdominal masses. Msk:  Strength and tone appear normal for age. Extremities: No edema. No clubbing or cyanosis. Distal pedal pulses are intact and equal bilaterally. Neuro: Alert and oriented X 3. Moves all extremities spontaneously. Psych:  Responds to questions appropriately with a normal affect.    ASSESSMENT AND PLAN:  78 y.o. male w/ PMHx significant for CAD (calcification by CT scan/Myoview 2010 low risk), Cardiomyopathy (EF 45-50%), HTN, Hypertriglyceridemia, Palpitations (?PAF), LBBB, mild MR/AI, CKD stage 3, and COPD who presented to [facility] on 12/20/2011 with complaints of shortness of breath and palpitations.  1. Palpitations 2. Acute on Chronic Systolic CHF, EF 91-47% by echo 2010 3. Elevated DDimer 4. Coronary Artery Disease 5. Hypertension 6. Hypertriglyceridemia 7. Chronic LBBB 8. CKD stage 3, baseline Crt ~ 1.4 9. COPD  Patient presents with vague complaints, but mainly worsening of his chronic palpitations and shortness of breath over the last 2-3wks. No chest pain, fever, orthopnea, edema, weight gain, or leg pain. CXR clear. EKG nonischemic. Troponin normal. BNP elevated 5845. DDimer elevated 0.90. No overt volume excess on exam. His main concern is the "skipping beats" that wake him up at night. His shortness of breath is likely from mild volume overload in the setting of dietary indiscretions.  Unsure if this has exacerbated his palpitations. No a.fib on tele, but does have frequent PVCs. His sob has improved with lasix. Is on BB and am hesitant to increase dose given intermittent RBBB/LBBB and 1st degree AVB. VQ scan is pending to r/o PE. If this is negative can be discharged home with close follow up with Dr. Myrtis Ser and decreased salt intake.  Signed, HOPE, JESSICA PA-C  Attending Note:   The patient was seen and examined.  Agree with assessment and plan as noted above.  Pt presents to the ER with symptoms of symptomatic PVCs.  +/- dyspnea.  He has many chronic issues - mainly related to his age of 89 years.  He had some leg issues thought  to be due to gout - involved leg edema.  VQ scan was low probability of PE.  His pro-BNP was mildly elevated but not significantly in the setting of CKD.  His lung exam was mostly clear - perhaps a few rales in the right base.  He seems to be relatively stable.  He should be able to be discharged from the ER.  I have asked to be careful with his salt intake.  He ate some hotdogs over the weekend.    To follow up with Dr. Myrtis Ser in a week. Return sooner if needed  Alvia Grove., MD, [facility] 12/20/2011, 9:30 PM   12/20/2011, 2:18 PM

## 2011-12-20 NOTE — ED Notes (Signed)
PA at bedside.

## 2011-12-20 NOTE — ED Notes (Addendum)
Patient transported to X-ray 

## 2011-12-20 NOTE — ED Notes (Signed)
Report from Lori, RN

## 2011-12-20 NOTE — ED Provider Notes (Signed)
Medical screening examination/treatment/procedure(s) were conducted as a shared visit with  non-physician practitioner(s) and myself.  I personally evaluated the patient during the encounter.  Worsening of chronic palpitations and SOB.  Has seen Dr. Myrtis Ser in past for same.  No chest pain. Fine rales on exam, no peripheral edema.  Improved with lasix.  COnsider diastolic dysfunction. Cardiology consult and r/o PE.  Glynn Octave, MD 12/20/11 [phone]

## 2011-12-20 NOTE — ED Notes (Signed)
D/C'd IV

## 2011-12-20 NOTE — ED Provider Notes (Signed)
VQ results reviewed and discussed with Dr. Patria Mane and with patient--low probability of PE.  Patient has been evaluated by cardiology with discharge instructions provided.  Jimmye Norman, NP 12/20/11 1950

## 2011-12-20 NOTE — ED Notes (Signed)
Pt c/o palpitations and SOB with generalized weakness x 2 days

## 2011-12-20 NOTE — ED Provider Notes (Signed)
History     CSN: [id number]  Arrival date & time 12/20/11  1101   First MD Initiated Contact with Patient 12/20/11 1120      Chief Complaint  Patient presents with  . Palpitations  . Shortness of Breath    (Consider location/radiation/quality/duration/timing/severity/associated sxs/prior treatment) HPI Comments: Levi Lewis 78 y.o. male   The chief complaint is: Patient presents with:   Palpitations   Shortness of Breath   The patient has medical history significant for:   Past Medical History:   Left bundle branch block                                       Comment:Septal dyssynergy related to this   CAD (coronary artery disease)                                  Comment:calcifcation by CT scan /  Nuclear, April,               2010, possible small   reversible defects in               the mid and apical anteroseptal wall   Hypertension                                                 Gout                                                         Dyslipidemia                                                   Comment:Elevated triglycerides   Palpitations                                                   Comment:presumed to be paroxysmal atrial fibrillation               but no definite documentation.   Systolic murmur                                              Ejection fraction                                              Comment:EF 45-50%, echo, April, 2010   Lung nodules  Comment:Nodular opacities, chest CT, April, 2010  /                followup CT showed opacities smaller August,               2010   COPD (chronic obstructive pulmonary disease)                 Shortness of breath                                            Comment:Smothering sensation April, 2010, question mild              volume overload, question bradycardia               tachycardia component, event recorder and EP               assessment-no  proven chronotropic incompetence   Mitral regurgitation                                           Comment:Mild, echo, April, 2010   Aortic insufficiency                                           Comment:Mild, echo, April, 2010   Shoulder pain                                                  Comment:Left rotator cuff tear  Patient states that has had an "abnormal heart rhythmn" in his heart for some time and reports two days of SOB, malasise, weakness, and palpitations. Patient states that also has some associated nausea. Patient also reports that he has had a dry cough that he hasn't been able to recover from for a few weeks. Denies fever, chills, or diaphoresis. Denies CP. Denies NVD, or abdominal pain.      The history is provided by the patient.    Past Medical History  Diagnosis Date  . Left bundle branch block     Septal dyssynergy related to this  . CAD (coronary artery disease)     calcifcation by CT scan /  Nuclear, April, 2010, possible small   reversible defects in the mid and apical anteroseptal wall  . Hypertension   . Gout   . Dyslipidemia     Elevated triglycerides  . Palpitations     presumed to be paroxysmal atrial fibrillation but no definite documentation.  . Systolic murmur   . Ejection fraction     EF 45-50%, echo, April, 2010  . Lung nodules     Nodular opacities, chest CT, April, 2010  /  followup CT showed opacities smaller August, 2010  . COPD (chronic obstructive pulmonary disease)   . Shortness of breath     Smothering sensation April, 2010, question mild volume overload, question bradycardia tachycardia component, event recorder and EP assessment-no proven chronotropic incompetence  . Mitral regurgitation     Mild, echo, April, 2010  . Aortic insufficiency  Mild, echo, April, 2010  . Shoulder pain     Left rotator cuff tear    Past Surgical History  Procedure Date  . Back surgery   . Right shoulder   . Right knee surgery   .  Tonsillectomy   . Coronary angioplasty     Family History  Problem Relation Age of Onset  . Heart attack Sister   . Lung cancer Brother     smoker  . Colon cancer Neg Hx     History  Substance Use Topics  . Smoking status: Never Smoker   . Smokeless tobacco: Not on file  . Alcohol Use: Not on file      Review of Systems  Constitutional: Negative for fever, chills and diaphoresis.  Respiratory: Positive for cough and shortness of breath.   Cardiovascular: Positive for palpitations and leg swelling. Negative for chest pain.  Gastrointestinal: Positive for nausea and abdominal pain. Negative for vomiting and diarrhea.  All other systems reviewed and are negative.    Allergies  Review of patient's allergies indicates no known allergies.  Home Medications   Current Outpatient Rx  Name Route Sig Dispense Refill  . ASPIRIN 81 MG PO TABS Oral Take 81 mg by mouth daily.      Marland Kitchen DIPHENHYDRAMINE HCL 25 MG PO TABS Oral Take 1 tablet (25 mg total) by mouth at bedtime as needed for itching. 30 tablet 0  . FENOFIBRATE 48 MG PO TABS Oral Take 0.5 tablets (24 mg total) by mouth daily. 45 tablet 2  . OMEGA-3 FATTY ACIDS 1000 MG PO CAPS Oral Take 2 g by mouth daily.      . FUROSEMIDE 40 MG PO TABS  TAKE ONE TABLET BY MOUTH DAILY. 90 tablet 2  . METOPROLOL SUCCINATE ER 50 MG PO TB24  TAKE ONE TABLET BY MOUTH DAILY 90 tablet 2  . CENTRUM SILVER PO Oral Take by mouth.      . OCUVITE PO Oral Take by mouth daily.      Marland Kitchen OMEPRAZOLE 20 MG PO CPDR Oral Take 20 mg by mouth daily.      Marland Kitchen TAMSULOSIN HCL 0.4 MG PO CAPS  1 tab po qd      BP 107/76  Pulse 47  Resp 24  SpO2 94%  Physical Exam  Nursing note and vitals reviewed. Constitutional: He appears well-developed and well-nourished. He appears distressed.  HENT:  Head: Normocephalic and atraumatic.  Mouth/Throat: Oropharynx is clear and moist.  Eyes: Conjunctivae and EOM are normal. No scleral icterus.  Cardiovascular: Normal rate,  normal heart sounds and intact distal pulses.        No carotid bruits appreciated on exam.  2+ pedal edema bilaterally  Pulmonary/Chest: Effort normal. He has rales.       Rales appreciated and the lower lung fields bilaterally.  Abdominal: Soft. Bowel sounds are normal. There is no tenderness.  Neurological: He is alert.  Skin: Skin is warm and dry.    ED Course  Procedures (including critical care time)   Labs Reviewed  CBC WITH DIFFERENTIAL  COMPREHENSIVE METABOLIC PANEL  URINALYSIS, ROUTINE W REFLEX MICROSCOPIC  TROPONIN I  D-DIMER, QUANTITATIVE  PRO B NATRIURETIC PEPTIDE   Results for orders placed during the hospital encounter of 12/20/11  CBC WITH DIFFERENTIAL      Component Value Range   WBC 9.1  4.0 - 10.5 K/uL   RBC 4.28  4.22 - 5.81 MIL/uL   Hemoglobin 13.3  13.0 - 17.0 g/dL  HCT 39.0  39.0 - 52.0 %   MCV 91.1  78.0 - 100.0 fL   MCH 31.1  26.0 - 34.0 pg   MCHC 34.1  30.0 - 36.0 g/dL   RDW 95.6  21.3 - 08.6 %   Platelets 300  150 - 400 K/uL   Neutrophils Relative 62  43 - 77 %   Neutro Abs 5.7  1.7 - 7.7 K/uL   Lymphocytes Relative 31  12 - 46 %   Lymphs Abs 2.9  0.7 - 4.0 K/uL   Monocytes Relative 6  3 - 12 %   Monocytes Absolute 0.5  0.1 - 1.0 K/uL   Eosinophils Relative 1  0 - 5 %   Eosinophils Absolute 0.1  0.0 - 0.7 K/uL   Basophils Relative 0  0 - 1 %   Basophils Absolute 0.0  0.0 - 0.1 K/uL  COMPREHENSIVE METABOLIC PANEL      Component Value Range   Sodium 137  135 - 145 mEq/L   Potassium 4.1  3.5 - 5.1 mEq/L   Chloride 102  96 - 112 mEq/L   CO2 19  19 - 32 mEq/L   Glucose, Bld 166 (*) 70 - 99 mg/dL   BUN 33 (*) 6 - 23 mg/dL   Creatinine, Ser 5.78 (*) 0.50 - 1.35 mg/dL   Calcium 9.4  8.4 - 46.9 mg/dL   Total Protein 7.3  6.0 - 8.3 g/dL   Albumin 3.5  3.5 - 5.2 g/dL   AST 17  0 - 37 U/L   ALT 10  0 - 53 U/L   Alkaline Phosphatase 63  39 - 117 U/L   Total Bilirubin 0.4  0.3 - 1.2 mg/dL   GFR calc non Af Amer 40 (*) >90 mL/min   GFR calc  Af Amer 46 (*) >90 mL/min  URINALYSIS, ROUTINE W REFLEX MICROSCOPIC      Component Value Range   Color, Urine YELLOW  YELLOW   APPearance CLEAR  CLEAR   Specific Gravity, Urine 1.010  1.005 - 1.030   pH 5.0  5.0 - 8.0   Glucose, UA NEGATIVE  NEGATIVE mg/dL   Hgb urine dipstick NEGATIVE  NEGATIVE   Bilirubin Urine NEGATIVE  NEGATIVE   Ketones, ur NEGATIVE  NEGATIVE mg/dL   Protein, ur NEGATIVE  NEGATIVE mg/dL   Urobilinogen, UA 0.2  0.0 - 1.0 mg/dL   Nitrite NEGATIVE  NEGATIVE   Leukocytes, UA NEGATIVE  NEGATIVE  TROPONIN I      Component Value Range   Troponin I <0.30  <0.30 ng/mL  D-DIMER, QUANTITATIVE      Component Value Range   D-Dimer, Quant 0.90 (*) 0.00 - 0.48 ug/mL-FEU  PRO B NATRIURETIC PEPTIDE      Component Value Range   Pro B Natriuretic peptide (BNP) 5845.0 (*) 0 - 450 pg/mL    Date: 12/20/2011  Rate: 82  Rhythm: normal sinus rhythm  QRS Axis: left  Intervals: normal  ST/T Wave abnormalities: normal  Conduction Disutrbances:first-degree A-V block   Narrative Interpretation: No STEMI  Old EKG Reviewed: unchanged    Dg Chest 2 View  12/20/2011  *RADIOLOGY REPORT*  Clinical Data: Tachycardia.  Weakness.  CHEST - 2 VIEW  Comparison: 09/30/2010.  Findings: The cardiac silhouette, mediastinal and hilar contours are stable.  Stable linear scarring changes.  No infiltrates, edema or effusions.  The bony thorax is intact.  IMPRESSION: No acute cardiopulmonary findings.   Original Report Authenticated By: P. MARK GALLERANI,  M.D.      1. SOB (shortness of breath)   2. Palpitations   3. Renal insufficiency   4. Elevated d-dimer       MDM  Patient presented with palpitations, malaise and SOB x2 days. Rales and pedal edema appreciated on exam. Patient given Lasix in ED with improvement. CMP: remarkable for renal insufficiency. Troponin, UA, CBC, unremarkable. CXR: unremarkable. Patient seen by [facility], PA-C. Due to renal insufficiency in presence of  elevated D-dimer patient will have a V/Q scan. Admission will be determined based on results. No red flags for AMI, PNA, or pneumothorax. Patient care signed out to Dr. Romeo Apple.       Pixie Casino, PA-C 12/20/11 1524

## 2011-12-21 NOTE — ED Provider Notes (Signed)
Medical screening examination/treatment/procedure(s) were performed by non-physician practitioner and as supervising physician I was immediately available for consultation/collaboration.   Denetta Fei M Blaise Grieshaber, MD 12/21/11 0047 

## 2011-12-26 ENCOUNTER — Ambulatory Visit (INDEPENDENT_AMBULATORY_CARE_PROVIDER_SITE_OTHER): Payer: Medicare Other | Admitting: Cardiology

## 2011-12-26 ENCOUNTER — Encounter: Payer: Self-pay | Admitting: Cardiology

## 2011-12-26 VITALS — BP 116/64 | HR 68 | Wt 179.0 lb

## 2011-12-26 DIAGNOSIS — I10 Essential (primary) hypertension: Secondary | ICD-10-CM

## 2011-12-26 DIAGNOSIS — I2510 Atherosclerotic heart disease of native coronary artery without angina pectoris: Secondary | ICD-10-CM

## 2011-12-26 DIAGNOSIS — R0602 Shortness of breath: Secondary | ICD-10-CM

## 2011-12-26 NOTE — Assessment & Plan Note (Signed)
Clinically we suspected the patient does have coronary disease. However he has been stable. No further workup is needed.

## 2011-12-26 NOTE — Assessment & Plan Note (Signed)
The patient has episodes of shortness of breath that are infrequent. We have wondered over time if this could be related to volume. It is important to control his volume. We have never proven either marked tachycardia or marked bradycardia. No further workup.

## 2011-12-26 NOTE — Progress Notes (Signed)
HPI   The patient is seen to followup he is arrhythmias and shortness of breath. Recently he was in the emergency room. This occurred on December 20, 2011. There is a very complete consult mode from our team. It was felt that he was stable and he can go home. He had some palpitations. A lung scan was done showing low probability of pulmonary embolus. He was not markedly volume overloaded. It was felt that he could go home watching his fluid and salt intake and arrange for followup. Since that time he is actually doing well.  No Known Allergies  Current Outpatient Prescriptions  Medication Sig Dispense Refill  . aspirin 81 MG tablet Take 81 mg by mouth daily.        . fenofibrate (TRICOR) 48 MG tablet Take 0.5 tablets (24 mg total) by mouth daily.  45 tablet  2  . fish oil-omega-3 fatty acids 1000 MG capsule Take 2 g by mouth daily.        . furosemide (LASIX) 40 MG tablet Take 40 mg by mouth daily.      . metoprolol succinate (TOPROL-XL) 50 MG 24 hr tablet Take 50 mg by mouth daily. Take with or immediately following a meal.      . Multiple Vitamins-Minerals (CENTRUM SILVER PO) Take 1 tablet by mouth daily.       . Multiple Vitamins-Minerals (OCUVITE PO) Take 1 tablet by mouth daily.       Marland Kitchen omeprazole (PRILOSEC) 20 MG capsule Take 20 mg by mouth daily.        . Tamsulosin HCl (FLOMAX) 0.4 MG CAPS Take by mouth. 1 tab po qd        History   Social History  . Marital Status: Married    Spouse Name: N/A    Number of Children: N/A  . Years of Education: N/A   Occupational History  . Not on file.   Social History Main Topics  . Smoking status: Never Smoker   . Smokeless tobacco: Not on file  . Alcohol Use: Not on file  . Drug Use: Not on file  . Sexually Active: Not on file   Other Topics Concern  . Not on file   Social History Narrative   He has never smoked or drank. No illcit drug use . He is retired from the [employer] and lives with his wife. Illicit Drug Use- no     Family History  Problem Relation Age of Onset  . Heart attack Sister   . Lung cancer Brother     smoker  . Colon cancer Neg Hx     Past Medical History  Diagnosis Date  . Left bundle branch block     Septal dyssynergy related to this  . CAD (coronary artery disease)     calcifcation by CT scan /  Nuclear, April, 2010, possible small   reversible defects in the mid and apical anteroseptal wall  . Hypertension   . Gout   . Dyslipidemia     Elevated triglycerides  . Palpitations     presumed to be paroxysmal atrial fibrillation but no definite documentation.  . Systolic murmur   . Ejection fraction     EF 45-50%, echo, April, 2010  . Lung nodules     Nodular opacities, chest CT, April, 2010  /  followup CT showed opacities smaller August, 2010  . COPD (chronic obstructive pulmonary disease)   . Shortness of breath     Smothering  sensation April, 2010, question mild volume overload, question bradycardia tachycardia component, event recorder and EP assessment-no proven chronotropic incompetence  . Mitral regurgitation     Mild, echo, April, 2010  . Aortic insufficiency     Mild, echo, April, 2010  . Shoulder pain     Left rotator cuff tear    Past Surgical History  Procedure Date  . Back surgery   . Right shoulder   . Right knee surgery   . Tonsillectomy   . Coronary angioplasty     ROS   Patient denies fever, chills, headache, sweats, rash, change in vision, change in hearing, chest pain, cough, nausea vomiting, urinary symptoms. All other systems are reviewed and are negative.  PHYSICAL EXAM   Patient is oriented to person time and place. Affect is normal. There is no jugulovenous distention. Lungs are clear. Respiratory effort is nonlabored. Cardiac exam reveals S1 and S2. There no clicks or significant murmurs. The abdomen is soft. There is no significant peripheral edema.  Filed Vitals:   12/26/11 1512  BP: 116/64  Pulse: 68  Weight: 179 lb (81.194 kg)      ASSESSMENT & PLAN

## 2011-12-26 NOTE — Assessment & Plan Note (Signed)
Blood pressure is controlled. No change in therapy. 

## 2011-12-26 NOTE — Patient Instructions (Addendum)
Your physician wants you to follow-up in:  6 months. You will receive a reminder letter in the mail two months in advance. If you don't receive a letter, please call our office to schedule the follow-up appointment.   

## 2012-03-27 ENCOUNTER — Ambulatory Visit (INDEPENDENT_AMBULATORY_CARE_PROVIDER_SITE_OTHER): Payer: Medicare Other | Admitting: Cardiology

## 2012-03-27 ENCOUNTER — Encounter: Payer: Self-pay | Admitting: Cardiology

## 2012-03-27 VITALS — BP 106/52 | HR 78 | Resp 12 | Ht 71.0 in | Wt 183.8 lb

## 2012-03-27 DIAGNOSIS — R002 Palpitations: Secondary | ICD-10-CM

## 2012-03-27 DIAGNOSIS — R0602 Shortness of breath: Secondary | ICD-10-CM

## 2012-03-27 DIAGNOSIS — I10 Essential (primary) hypertension: Secondary | ICD-10-CM

## 2012-03-27 NOTE — Progress Notes (Signed)
HPI  The patient is seen to followup history of shortness of breath and palpitations. I saw him last September, 2013. He had been in the emergency room just prior to that visit. He was assessed fully and felt to be stable with that emergency room visit.  He's been careful with his salt and fluid intake. He does have palpitations but they are not prolonged period.  No Known Allergies  Current Outpatient Prescriptions  Medication Sig Dispense Refill  . aspirin 81 MG tablet Take 81 mg by mouth daily.        . fenofibrate (TRICOR) 48 MG tablet Take 0.5 tablets (24 mg total) by mouth daily.  45 tablet  2  . fish oil-omega-3 fatty acids 1000 MG capsule Take 2 g by mouth daily.        . furosemide (LASIX) 40 MG tablet Take 40 mg by mouth daily.      . metoprolol succinate (TOPROL-XL) 50 MG 24 hr tablet Take 50 mg by mouth daily. Take with or immediately following a meal.      . Multiple Vitamins-Minerals (CENTRUM SILVER PO) Take 1 tablet by mouth daily.       . Multiple Vitamins-Minerals (OCUVITE PO) Take 1 tablet by mouth daily.       Marland Kitchen omeprazole (PRILOSEC) 20 MG capsule Take 20 mg by mouth daily.        . Tamsulosin HCl (FLOMAX) 0.4 MG CAPS Take by mouth. 1 tab po qd        History   Social History  . Marital Status: Married    Spouse Name: N/A    Number of Children: N/A  . Years of Education: N/A   Occupational History  . Not on file.   Social History Main Topics  . Smoking status: Never Smoker   . Smokeless tobacco: Not on file  . Alcohol Use: Not on file  . Drug Use: Not on file  . Sexually Active: Not on file   Other Topics Concern  . Not on file   Social History Narrative   He has never smoked or drank. No illcit drug use . He is retired from the [employer] and lives with his wife. Illicit Drug Use- no    Family History  Problem Relation Age of Onset  . Heart attack Sister   . Lung cancer Brother     smoker  . Colon cancer Neg Hx     Past Medical History    Diagnosis Date  . Left bundle branch block     Septal dyssynergy related to this  . CAD (coronary artery disease)     calcifcation by CT scan /  Nuclear, April, 2010, possible small   reversible defects in the mid and apical anteroseptal wall  . Hypertension   . Gout   . Dyslipidemia     Elevated triglycerides  . Palpitations     presumed to be paroxysmal atrial fibrillation but no definite documentation.  . Systolic murmur   . Ejection fraction     EF 45-50%, echo, April, 2010  . Lung nodules     Nodular opacities, chest CT, April, 2010  /  followup CT showed opacities smaller August, 2010  . COPD (chronic obstructive pulmonary disease)   . Shortness of breath     Smothering sensation April, 2010, question mild volume overload, question bradycardia tachycardia component, event recorder and EP assessment-no proven chronotropic incompetence  . Mitral regurgitation     Mild, echo, April,  2010  . Aortic insufficiency     Mild, echo, April, 2010  . Shoulder pain     Left rotator cuff tear    Past Surgical History  Procedure Date  . Back surgery   . Right shoulder   . Right knee surgery   . Tonsillectomy   . Coronary angioplasty     Patient Active Problem List  Diagnosis  . Left bundle branch block  . CAD (coronary artery disease)  . Hypertension  . Gout  . Dyslipidemia  . Palpitations  . Systolic murmur  . Ejection fraction  . Lung nodules  . COPD (chronic obstructive pulmonary disease)  . Shortness of breath  . Mitral regurgitation  . Aortic insufficiency  . Shoulder pain    ROS   Patient denies fever, chills, headache, sweats, rash, change in vision, change in hearing, chest pain, cough, nausea vomiting, urinary symptoms. All other systems are reviewed and are negative.  PHYSICAL EXAM   Patient is oriented to person time and place. Affect is normal. He looks quite good today. There is no jugulovenous distention. Lungs are clear. Respiratory effort is  nonlabored. Cardiac exam reveals S1 and S2. There no clicks or significant murmurs. The abdomen is soft. Is no peripheral edema.  Filed Vitals:   03/27/12 0848  BP: 106/52  Pulse: 78  Resp: 12  Height: 5\' 11"  (1.803 m)  Weight: 183 lb 12 oz (83.348 kg)     ASSESSMENT & PLAN

## 2012-03-27 NOTE — Assessment & Plan Note (Signed)
Blood pressures control. No change in therapy. 

## 2012-03-27 NOTE — Patient Instructions (Addendum)
Your physician wants you to follow-up in: 6 months  You will receive a reminder letter in the mail two months in advance. If you don't receive a letter, please call our office to schedule the follow-up appointment.  Your physician recommends that you continue on your current medications as directed. Please refer to the Current Medication list given to you today.  

## 2012-03-27 NOTE — Assessment & Plan Note (Signed)
The patient has palpitations over time. We have never actually proven a definite supraventricular arrhythmia. However it has been thought that he has had some of this in the past. Reassurance is very important for him. No further workup.

## 2012-03-27 NOTE — Assessment & Plan Note (Signed)
He's not having any significant shortness of breath. No further workup.

## 2012-05-28 ENCOUNTER — Other Ambulatory Visit: Payer: Self-pay

## 2012-05-28 MED ORDER — FUROSEMIDE 40 MG PO TABS: 40.0000 mg | ORAL_TABLET | Freq: Every day | ORAL | Status: DC

## 2012-06-05 ENCOUNTER — Other Ambulatory Visit: Payer: Self-pay

## 2012-06-05 MED ORDER — FENOFIBRATE 48 MG PO TABS: 24.0000 mg | ORAL_TABLET | Freq: Every day | ORAL | Status: DC

## 2012-07-26 ENCOUNTER — Other Ambulatory Visit: Payer: Self-pay

## 2012-07-26 MED ORDER — FUROSEMIDE 40 MG PO TABS: 40.0000 mg | ORAL_TABLET | Freq: Every day | ORAL | Status: DC

## 2012-08-21 ENCOUNTER — Other Ambulatory Visit: Payer: Self-pay | Admitting: *Deleted

## 2012-08-21 MED ORDER — METOPROLOL SUCCINATE ER 50 MG PO TB24: 50.0000 mg | ORAL_TABLET | Freq: Every day | ORAL | Status: DC

## 2012-08-27 ENCOUNTER — Ambulatory Visit
Admission: RE | Admit: 2012-08-27 | Discharge: 2012-08-27 | Disposition: A | Discharge status: Home / Self Care | Payer: Medicare Other | Source: Ambulatory Visit | Attending: Internal Medicine | Admitting: Internal Medicine

## 2012-08-27 ENCOUNTER — Other Ambulatory Visit: Payer: Self-pay | Admitting: Internal Medicine

## 2012-08-27 DIAGNOSIS — M25551 Pain in right hip: Secondary | ICD-10-CM

## 2012-09-28 ENCOUNTER — Emergency Department (INDEPENDENT_AMBULATORY_CARE_PROVIDER_SITE_OTHER)
Admission: EM | Admit: 2012-09-28 | Discharge: 2012-09-28 | Disposition: A | Discharge status: Home / Self Care | Payer: Medicare Other | Source: Home / Self Care | Attending: Emergency Medicine | Admitting: Emergency Medicine

## 2012-09-28 ENCOUNTER — Encounter (HOSPITAL_COMMUNITY): Payer: Self-pay | Admitting: Emergency Medicine

## 2012-09-28 ENCOUNTER — Telehealth: Payer: Self-pay

## 2012-09-28 ENCOUNTER — Emergency Department (INDEPENDENT_AMBULATORY_CARE_PROVIDER_SITE_OTHER): Payer: Medicare Other

## 2012-09-28 DIAGNOSIS — J449 Chronic obstructive pulmonary disease, unspecified: Secondary | ICD-10-CM

## 2012-09-28 DIAGNOSIS — I509 Heart failure, unspecified: Secondary | ICD-10-CM

## 2012-09-28 DIAGNOSIS — R0989 Other specified symptoms and signs involving the circulatory and respiratory systems: Secondary | ICD-10-CM

## 2012-09-28 DIAGNOSIS — R0609 Other forms of dyspnea: Secondary | ICD-10-CM

## 2012-09-28 DIAGNOSIS — I5022 Chronic systolic (congestive) heart failure: Secondary | ICD-10-CM

## 2012-09-28 DIAGNOSIS — R0600 Dyspnea, unspecified: Secondary | ICD-10-CM

## 2012-09-28 LAB — POCT I-STAT, CHEM 8
Calcium, Ion: 1.16 mmol/L (ref 1.13–1.30)
Glucose, Bld: 136 mg/dL — ABNORMAL HIGH (ref 70–99)
HCT: 39 % (ref 39.0–52.0)
Hemoglobin: 13.3 g/dL (ref 13.0–17.0)

## 2012-09-28 MED ORDER — LORAZEPAM 0.5 MG PO TABS: ORAL_TABLET | ORAL | Status: DC

## 2012-09-28 NOTE — Telephone Encounter (Signed)
**Note De-Identified  Obfuscation** Levi Lewis, MRI tech, states that the pt is there to have MRI that was ordered by Dr. Thurston Hole from [facility]. Levi Lewis states that the pt is SOB and she states she was advised by Dr. Sherene Sires office to contact us concerning pts SOB and that she thinks they just want [place] to be aware and maybe get pt in to see Dr Myrtis Ser. Levi Lewis is advised that the pt already has appt scheduled with Dr Myrtis Ser on 6/24.

## 2012-09-28 NOTE — ED Provider Notes (Signed)
Chief Complaint:   Chief Complaint  Patient presents with  . Shortness of Breath    History of Present Illness:   CEBASTIAN NEIS is a 79 year old male who was sent here from [facility] imaging after an MRI was attempted for lower back pain. Apparently he had difficulty breathing when going into the MRI tube, and the MRI technician referred him here for evaluation of the shortness of breath. The patient states he's been short of breath for years. This tends to come and go. Has been worse the past few weeks. He's also had dry cough but denies any wheezing. He has had no ankle edema, PND, orthopnea. He is able to lie flat at nighttime. He denies any chest pain, tightness, pressure, or discomfort. His legs sometimes feel weak. He denies any dizziness or syncope. He has a history of palpitations and congestive heart failure due to cardiomyopathy. His ejection fraction has been running around 40-45%. He was seen in the emergency room for similar symptoms last September. He underwent a fairly thorough workup at that time. His BNP was elevated, but it's always elevated. A d-dimer was done which was also mildly elevated 0.9. He had a lung scan which was low probability for pulmonary embolus. He was followed up by Dr. Charlton Haws. He hasn't been to see a physician since then. Dr. Elmore Guise is his primary care Dr. and Dr. Jerral Bonito is his primary cardiologist, but he hasn't seen either them for a long time. He denies any fever, chills, and does not have any pain anywhere.  Review of Systems:  Other than noted above, the patient denies any of the following symptoms. Systemic:  No fever, chills, sweats, or fatigue. ENT:  No nasal congestion, rhinorrhea, or sore throat. Pulmonary:  No cough, wheezing, shortness of breath, sputum production, hemoptysis. Cardiac:  No palpitations, rapid heartbeat, dizziness, presyncope or syncope. Skin:  No rash or itching. Ext:  No leg pain or swelling. Psych:  No anxiety or  depression.  PMFSH:  Past medical history, family history, social history, meds, and allergies were reviewed and updated as needed. The patient denies any history of asthma, lung disease, heart disease, anxiety, or tobacco use. He has a history of left bundle branch block, coronary artery disease, hypertension, gout, dyslipidemia, palpitations, systolic murmur, cardiomyopathy, lung nodules, COPD, shortness of breath, mitral regurgitation, aortic insufficiency, and shoulder pain. Current meds include aspirin, TriCor, furosemide, Toprol, Ocuvite, Prilosec, and Flomax.  Physical Exam:   Vital signs:  BP 121/74  Pulse 77  Temp(Src) 97.7 F (36.5 C) (Oral)  Resp 18  SpO2 95% Gen:  Alert, oriented, in no distress, skin warm and dry. He does not appear short of breath either sitting or lying flat. Eye:  PERRL, lids and conjunctivas normal.  Sclera non-icteric. ENT:  Mucous membranes moist, pharynx clear. Neck:  Supple, no adenopathy or tenderness.  No JVD. Lungs:  Clear to auscultation and percussion, no wheezes or rhonchi. He has rales at both bases. No respiratory distress. Heart:  Regular rhythm.  No gallops, murmers, clicks or rubs. Abdomen:  Soft, nontender, no organomegaly or mass.  Bowel sounds normal.  No pulsatile abdominal mass or bruit. Ext:  No edema.  No calf tenderness and Homann's sign negative.  Pulses full and equal. Skin:  Warm and dry.  No rash.  Labs:   Results for orders placed during the hospital encounter of 09/28/12  POCT I-STAT, CHEM 8      Result Value Range   Sodium  143  135 - 145 mEq/L   Potassium 4.6  3.5 - 5.1 mEq/L   Chloride 111  96 - 112 mEq/L   BUN 25 (*) 6 - 23 mg/dL   Creatinine, Ser 1.61 (*) 0.50 - 1.35 mg/dL   Glucose, Bld 096 (*) 70 - 99 mg/dL   Calcium, Ion 0.45  4.09 - 1.30 mmol/L   TCO2 25  0 - 100 mmol/L   Hemoglobin 13.3  13.0 - 17.0 g/dL   HCT 81.1  91.4 - 78.2 %     Radiology:  Dg Chest 2 View  09/28/2012   *RADIOLOGY REPORT*  Clinical  Data: Short of breath  CHEST - 2 VIEW  Comparison: 12/20/2011  Findings: COPD with hyperinflation and pulmonary scarring. Negative for pneumonia.  Negative for heart failure or mass lesion.  IMPRESSION: COPD.  No acute abnormality.   Original Report Authenticated By: Janeece Riggers, M.D.    EKG:   Date: 09/28/2012  Rate: 65  Rhythm: normal sinus rhythm with first degree AV block  QRS Axis: left  Intervals: normal  ST/T Wave abnormalities: normal  Conduction Disutrbances:left bundle branch block  Narrative Interpretation: Sinus rhythm with first degree AV block, left axis deviation, left bundle branch block. Compared to previous EKG of 12/20/2011, no change was noted.  Old EKG Reviewed: unchanged  Assessment:  The primary encounter diagnosis was Dyspnea. Diagnoses of CHF (congestive heart failure), chronic, systolic and COPD (chronic obstructive pulmonary disease) were also pertinent to this visit.  I suspect the patient's shortness of breath is probably multifactorial, being due to congestive heart failure secondary to cardiomyopathy, COPD, and there may be a large component of anxiety, particularly with the MRI scan. If he needs to do an MRI scan, he was provided with a small dose of lorazepam to take 30 minutes prior to the procedure. He was warned not to drive thereafter. He appears to be stable today and does not have any acute exacerbation of congestive heart failure or COPD. One thing I am most concerned about is his increasing serum creatinine. Last September it was 1.47 and now is up to 1.60. I recommended that he followup with Dr. Myrtis Ser early next week.  Plan:   1.  The following meds were prescribed:   New Prescriptions   LORAZEPAM (ATIVAN) 0.5 MG TABLET    Take 30 minutes before procedure   2.  The patient was instructed in symptomatic care and handouts were given. 3.  The patient was told to return if becoming worse in any way, if no better in 3 or 4 days, and given some red flag  symptoms such as chest pain or increasing dyspnea that would indicate earlier return. 4.  Follow up with Dr. Myrtis Ser next week.    Reuben Likes, MD 09/28/12 [phone]

## 2012-09-28 NOTE — ED Notes (Addendum)
Pt reports he was having an MRI procedure today but did not have it b/c of SOB... Was asked to come here for a f/u Pt reports intermittent SOB w/leg weakness... Hx of COPD  Denies: Pain at the moment, CP, HA... He is alert and oriented w/no signs of acute distress.... Ambulated well to exam room.

## 2012-10-01 ENCOUNTER — Encounter: Payer: Self-pay | Admitting: Nurse Practitioner

## 2012-10-01 ENCOUNTER — Ambulatory Visit (INDEPENDENT_AMBULATORY_CARE_PROVIDER_SITE_OTHER): Payer: Medicare Other | Admitting: Nurse Practitioner

## 2012-10-01 VITALS — BP 144/62 | HR 74 | Ht 71.0 in | Wt 182.4 lb

## 2012-10-01 DIAGNOSIS — R0609 Other forms of dyspnea: Secondary | ICD-10-CM

## 2012-10-01 DIAGNOSIS — R0600 Dyspnea, unspecified: Secondary | ICD-10-CM

## 2012-10-01 LAB — BASIC METABOLIC PANEL
BUN: 26 mg/dL — ABNORMAL HIGH (ref 6–23)
CO2: 21 mEq/L (ref 19–32)
Calcium: 8.8 mg/dL (ref 8.4–10.5)
Chloride: 109 mEq/L (ref 96–112)
Creatinine, Ser: 1.5 mg/dL (ref 0.4–1.5)
GFR: 47.85 mL/min — ABNORMAL LOW (ref >60.00)
Glucose, Bld: 110 mg/dL — ABNORMAL HIGH (ref 70–99)
Potassium: 4.1 mEq/L (ref 3.5–5.1)
Sodium: 140 mEq/L (ref 135–145)

## 2012-10-01 LAB — BRAIN NATRIURETIC PEPTIDE: Pro B Natriuretic peptide (BNP): 303 pg/mL — ABNORMAL HIGH (ref 0.0–100.0)

## 2012-10-01 NOTE — Patient Instructions (Addendum)
We will call Dr. Hoy Register office to try and get your MRI at [facility] moved to an open scanner location.  Keep you visit next week with Dr. Myrtis Ser  We are checking more blood work today  Call the [facility] office at [phone] if you have any questions, problems or concerns.

## 2012-10-01 NOTE — Progress Notes (Signed)
Urbano Heir Date of Birth: [birth year] Medical Record #[id number]  History of Present Illness: Mr. Irving is seen back today for a work in visit. Seen for Dr. Myrtis Ser. Last visit here was back in December and was due back later this month. Has a history of shortness of breath and palpitations. Other issues include LBBB with septal dyssynergy, CAD with last nuclear in April of 2010 with possible small reversible defects in the mid and apical anteroseptal wall, HTN, gout, HLD, palpitations, mildly low EF of 45 to 50% per echo in 2010, chronic lung nodules since [address], COPD, MR, and AI.   Phone call last week from an MRI tech - was to have an MRI per Dr. Cleophas Dunker. They noted the patient was short of breath. Sent him to the Urgent Care where he was evaluated. His dyspnea (which has been chronic for years) was felt to be multifactorial (COPD/CM) and possibly more anxiety driven with having to go into the MRI scanner (it was a closed scanner).  Creatinine was slowly increasing. Given a dose of Ativan to use if he proceeds with the MRI.   He comes in today. He is here alone. He says he is doing ok. His shortness of breath is really no different. Still has a dry cough. No swelling. Thinks he got more short of breath with having to lie flat in the closed scanner - has been in the open scanner before and did ok. Still remains fairly active. No chest pain. Not dizzy or lightheaded.   Current Outpatient Prescriptions on File Prior to Visit  Medication Sig Dispense Refill  . aspirin 81 MG tablet Take 81 mg by mouth daily.        . fenofibrate (TRICOR) 48 MG tablet Take 0.5 tablets (24 mg total) by mouth daily.  45 tablet  1  . fish oil-omega-3 fatty acids 1000 MG capsule Take 2 g by mouth daily.        . furosemide (LASIX) 40 MG tablet Take 1 tablet (40 mg total) by mouth daily.  90 tablet  0  . LORazepam (ATIVAN) 0.5 MG tablet Take 30 minutes before procedure  1 tablet  0  . metoprolol succinate (TOPROL-XL)  50 MG 24 hr tablet Take 1 tablet (50 mg total) by mouth daily. Take with or immediately following a meal.  90 tablet  3  . Multiple Vitamins-Minerals (OCUVITE PO) Take 2 tablets by mouth daily.       Marland Kitchen omeprazole (PRILOSEC) 20 MG capsule Take 20 mg by mouth daily.        . Tamsulosin HCl (FLOMAX) 0.4 MG CAPS Take by mouth. 1 tab po qd       No current facility-administered medications on file prior to visit.    No Known Allergies  Past Medical History  Diagnosis Date  . Left bundle branch block     Septal dyssynergy related to this  . CAD (coronary artery disease)     calcifcation by CT scan /  Nuclear, April, 2010, possible small   reversible defects in the mid and apical anteroseptal wall  . Hypertension   . Gout   . Dyslipidemia     Elevated triglycerides  . Palpitations     presumed to be paroxysmal atrial fibrillation but no definite documentation.  . Systolic murmur   . Ejection fraction     EF 45-50%, echo, April, 2010  . Lung nodules     Nodular opacities, chest CT, April, 2010  /  followup CT showed opacities smaller August, 2010  . COPD (chronic obstructive pulmonary disease)   . Shortness of breath     Smothering sensation April, 2010, question mild volume overload, question bradycardia tachycardia component, event recorder and EP assessment-no proven chronotropic incompetence  . Mitral regurgitation     Mild, echo, April, 2010  . Aortic insufficiency     Mild, echo, April, 2010  . Shoulder pain     Left rotator cuff tear    Past Surgical History  Procedure Laterality Date  . Back surgery    . Right shoulder    . Right knee surgery    . Tonsillectomy    . Coronary angioplasty      History  Smoking status  . Never Smoker   Smokeless tobacco  . Not on file    History  Alcohol Use No    Family History  Problem Relation Age of Onset  . Heart attack Sister   . Lung cancer Brother     smoker  . Colon cancer Neg Hx     Review of Systems: The  review of systems is per the HPI.  All other systems were reviewed and are negative.  Physical Exam: BP 144/62  Pulse 74  Ht 5\' 11"  (1.803 m)  Wt 182 lb 6.4 oz (82.736 kg)  BMI 25.45 kg/m2  SpO2 95% Oxygen sat was 96% and with walking here in the office, he dropped to only 92%.  Patient is a very pleasant elderly male and in no acute distress. Skin is warm and dry. Color is normal.  HEENT is unremarkable. Normocephalic/atraumatic. PERRL. Sclera are nonicteric. Neck is supple. No masses. No JVD. Lungs are clear. Cardiac exam shows a regular rate and rhythm. Soft systolic murmur. Abdomen is soft. Extremities are without edema. Gait and ROM are intact. No gross neurologic deficits noted.  LABORATORY DATA: Repeat BMET along with a BNP pending.      Chemistry      Component Value Date/Time   NA 143 09/28/2012 1534   K 4.6 09/28/2012 1534   CL 111 09/28/2012 1534   CO2 19 12/20/2011 1137   BUN 25* 09/28/2012 1534   CREATININE 1.60* 09/28/2012 1534      Component Value Date/Time   CALCIUM 9.4 12/20/2011 1137   ALKPHOS 63 12/20/2011 1137   AST 17 12/20/2011 1137   ALT 10 12/20/2011 1137   BILITOT 0.4 12/20/2011 1137     Lab Results  Component Value Date   WBC 9.1 12/20/2011   HGB 13.3 09/28/2012   HCT 39.0 09/28/2012   MCV 91.1 12/20/2011   PLT 300 12/20/2011   Dg Chest 2 View  09/28/2012   *RADIOLOGY REPORT*  Clinical Data: Short of breath  CHEST - 2 VIEW  Comparison: 12/20/2011  Findings: COPD with hyperinflation and pulmonary scarring. Negative for pneumonia.  Negative for heart failure or mass lesion.  IMPRESSION: COPD.  No acute abnormality.   Original Report Authenticated By: Janeece Riggers, M.D.   Assessment / Plan: 1. Dyspnea - probably multifactorial - I think this was more a result of the MRI scanner. He would like to go to an open scanner. Will alert Dr. Cleophas Dunker and see if this can get moved. I think he is at his baseline at this time.   2. CAD - no chest pain - managed medically.   3. COPD  with chronic dyspnea. Does not desat with walking here in the office.   4. Mild LV dysfunction -  will check BNP  5. Mild CRI - recheck BMET today. I suspect this is more age related.   We will see him back at his regular time.   Patient is agreeable to this plan and will call if any problems develop in the interim.   Rosalio Macadamia, RN, ANP-C [facility] [address] [place], [place]  [address]

## 2012-10-02 ENCOUNTER — Other Ambulatory Visit: Payer: Self-pay | Admitting: Orthopaedic Surgery

## 2012-10-02 DIAGNOSIS — M545 Low back pain: Secondary | ICD-10-CM

## 2012-10-06 ENCOUNTER — Encounter: Payer: Self-pay | Admitting: Cardiology

## 2012-10-09 ENCOUNTER — Ambulatory Visit (INDEPENDENT_AMBULATORY_CARE_PROVIDER_SITE_OTHER): Payer: Medicare Other | Admitting: Cardiology

## 2012-10-09 ENCOUNTER — Encounter: Payer: Self-pay | Admitting: Cardiology

## 2012-10-09 VITALS — BP 130/78 | HR 79 | Ht 71.0 in | Wt 180.4 lb

## 2012-10-09 DIAGNOSIS — M549 Dorsalgia, unspecified: Secondary | ICD-10-CM | POA: Insufficient documentation

## 2012-10-09 DIAGNOSIS — I359 Nonrheumatic aortic valve disorder, unspecified: Secondary | ICD-10-CM

## 2012-10-09 DIAGNOSIS — I351 Nonrheumatic aortic (valve) insufficiency: Secondary | ICD-10-CM

## 2012-10-09 DIAGNOSIS — R0602 Shortness of breath: Secondary | ICD-10-CM

## 2012-10-09 MED ORDER — FENOFIBRATE 48 MG PO TABS: 24.0000 mg | ORAL_TABLET | Freq: Every day | ORAL | Status: DC

## 2012-10-09 MED ORDER — FUROSEMIDE 40 MG PO TABS: 40.0000 mg | ORAL_TABLET | Freq: Every day | ORAL | Status: DC

## 2012-10-09 MED ORDER — TAMSULOSIN HCL 0.4 MG PO CAPS: 0.4000 mg | ORAL_CAPSULE | Freq: Every day | ORAL | Status: DC

## 2012-10-09 NOTE — Assessment & Plan Note (Signed)
He had difficulty trying to begin have an MRI. He asked me if I thought he should go through with it. He seems to be quite anxious about it. At this point I have suggested that he may want to hold off on having his MRI.

## 2012-10-09 NOTE — Progress Notes (Signed)
HPI  Patient is seen to followup his cardiac status. He was recently seen in our office. He was going to have an MRI but he had shortness of breath when lying down. He was evaluated fully in the office and was felt that his overall cardiac status was stable. It seemed that some of his symptoms might be related to the actual attempt to do the MRI in the lying position. He returns today and he stable. He says that he has exertional shortness of breath. This has been a chronic problem. However he also has some dizziness when standing. We will not be able to push his diuretics and a harder.  No Known Allergies  Current Outpatient Prescriptions  Medication Sig Dispense Refill  . aspirin 81 MG tablet Take 81 mg by mouth daily.        . fenofibrate (TRICOR) 48 MG tablet Take 0.5 tablets (24 mg total) by mouth daily.  45 tablet  3  . fish oil-omega-3 fatty acids 1000 MG capsule Take 2 g by mouth daily.        . furosemide (LASIX) 40 MG tablet Take 1 tablet (40 mg total) by mouth daily.  90 tablet  3  . metoprolol succinate (TOPROL-XL) 50 MG 24 hr tablet Take 1 tablet (50 mg total) by mouth daily. Take with or immediately following a meal.  90 tablet  3  . Multiple Vitamins-Minerals (OCUVITE PO) Take 2 tablets by mouth daily.       Marland Kitchen omeprazole (PRILOSEC) 20 MG capsule Take 20 mg by mouth daily.        . tamsulosin (FLOMAX) 0.4 MG CAPS Take 1 capsule (0.4 mg total) by mouth daily. 1 tab po qd  90 capsule  3   No current facility-administered medications for this visit.    History   Social History  . Marital Status: Married    Spouse Name: N/A    Number of Children: N/A  . Years of Education: N/A   Occupational History  . Not on file.   Social History Main Topics  . Smoking status: Never Smoker   . Smokeless tobacco: Not on file  . Alcohol Use: No  . Drug Use: No  . Sexually Active: Not Currently   Other Topics Concern  . Not on file   Social History Narrative   He has never smoked  or drank. No illcit drug use . He is retired from the [employer] and lives with his wife. Illicit Drug Use- no    Family History  Problem Relation Age of Onset  . Heart attack Sister   . Lung cancer Brother     smoker  . Colon cancer Neg Hx     Past Medical History  Diagnosis Date  . Left bundle branch block   . CAD (coronary artery disease)   . Hypertension   . Gout   . Dyslipidemia   . Palpitations   . Systolic murmur   . Ejection fraction   . Lung nodules   . COPD (chronic obstructive pulmonary disease)   . Shortness of breath   . Mitral regurgitation   . Aortic insufficiency   . Shoulder pain     Past Surgical History  Procedure Laterality Date  . Back surgery    . Right shoulder    . Right knee surgery    . Tonsillectomy    . Coronary angioplasty      Patient Active Problem List   Diagnosis Date Noted  .  Shoulder pain   . Left bundle branch block   . CAD (coronary artery disease)   . Hypertension   . Gout   . Dyslipidemia   . Palpitations   . Ejection fraction   . Lung nodules   . COPD (chronic obstructive pulmonary disease)   . Shortness of breath   . Mitral regurgitation   . Aortic insufficiency     ROS  Patient denies fever, chills, headache, sweats, rash, change in vision, change in hearing, chest pain, cough, nausea vomiting, urinary symptoms. All other systems are reviewed and are negative.  PHYSICAL EXAM   Patient is oriented to person time and place. Affect is normal. There is no jugulovenous distention. Lungs reveal scattered rhonchi. There is no respiratory distress. Cardiac exam reveals S1 and S2. There no clicks or significant murmurs. The abdomen is soft. It is no peripheral edema.  Filed Vitals:   10/09/12 1120  BP: 130/78  Pulse: 79  Height: 5\' 11"  (1.803 m)  Weight: 180 lb 6.4 oz (81.829 kg)  SpO2: 93%     ASSESSMENT & PLAN

## 2012-10-09 NOTE — Assessment & Plan Note (Signed)
Over time we have felt that his shortness of breath may be related to some volume overload and some palpitations. He does not appear to be volume overloaded at this time. I'm very hesitant to push his diuretics. He tells me he has some mild dizziness when standing at times. Overall I decided not to change any of his medications.

## 2012-10-09 NOTE — Patient Instructions (Addendum)
**Note De-identified  Obfuscation** Your physician recommends that you continue on your current medications as directed. Please refer to the Current Medication list given to you today. Your physician wants you to follow-up in: 4 months You will receive a reminder letter in the mail two months in advance. If you don't receive a letter, please call our office to schedule the follow-up appointment.  

## 2012-10-09 NOTE — Assessment & Plan Note (Signed)
His valvular abnormalities are mild. No further workup.

## 2012-10-11 ENCOUNTER — Other Ambulatory Visit: Payer: Medicare Other

## 2013-02-04 ENCOUNTER — Ambulatory Visit (INDEPENDENT_AMBULATORY_CARE_PROVIDER_SITE_OTHER): Payer: Medicare Other | Admitting: Cardiology

## 2013-02-04 ENCOUNTER — Encounter: Payer: Self-pay | Admitting: Cardiology

## 2013-02-04 VITALS — BP 122/68 | HR 67 | Ht 71.0 in | Wt 175.0 lb

## 2013-02-04 DIAGNOSIS — I351 Nonrheumatic aortic (valve) insufficiency: Secondary | ICD-10-CM

## 2013-02-04 DIAGNOSIS — R002 Palpitations: Secondary | ICD-10-CM

## 2013-02-04 DIAGNOSIS — I059 Rheumatic mitral valve disease, unspecified: Secondary | ICD-10-CM

## 2013-02-04 DIAGNOSIS — I10 Essential (primary) hypertension: Secondary | ICD-10-CM

## 2013-02-04 DIAGNOSIS — I340 Nonrheumatic mitral (valve) insufficiency: Secondary | ICD-10-CM

## 2013-02-04 DIAGNOSIS — I359 Nonrheumatic aortic valve disorder, unspecified: Secondary | ICD-10-CM

## 2013-02-04 DIAGNOSIS — I2510 Atherosclerotic heart disease of native coronary artery without angina pectoris: Secondary | ICD-10-CM

## 2013-02-04 DIAGNOSIS — R0602 Shortness of breath: Secondary | ICD-10-CM

## 2013-02-04 NOTE — Assessment & Plan Note (Signed)
We presume that he has coronary disease. We know that he has some coronary calcification. Nuclear scan in 2010 raises the question of some small reversible areas. He's not having any significant symptoms. No further workup needed.

## 2013-02-04 NOTE — Assessment & Plan Note (Signed)
He had mild mitral regurgitation by echo in April, 2010. He does not need a followup echo.

## 2013-02-04 NOTE — Assessment & Plan Note (Signed)
Blood pressure is controlled today. No change in therapy. 

## 2013-02-04 NOTE — Assessment & Plan Note (Signed)
He continues to have some mild palpitations. He is not limited by this. We have never proven exactly what his rhythm is. Therefore he is not anticoagulated.

## 2013-02-04 NOTE — Progress Notes (Signed)
HPI   This very pleasant 79 year old gentleman returns for followup. He still has some palpitations but he is actually quite stable. He has back and knee pain. He continues to be active. He's not having any major dizziness. He's not having any significant shortness of breath.  No Known Allergies  Current Outpatient Prescriptions  Medication Sig Dispense Refill  . aspirin 81 MG tablet Take 81 mg by mouth daily.        . fenofibrate (TRICOR) 48 MG tablet Take 0.5 tablets (24 mg total) by mouth daily.  45 tablet  3  . fish oil-omega-3 fatty acids 1000 MG capsule Take 2 g by mouth daily.        . furosemide (LASIX) 40 MG tablet Take 1 tablet (40 mg total) by mouth daily.  90 tablet  3  . metoprolol succinate (TOPROL-XL) 50 MG 24 hr tablet Take 1 tablet (50 mg total) by mouth daily. Take with or immediately following a meal.  90 tablet  3  . Multiple Vitamins-Minerals (OCUVITE PO) Take 2 tablets by mouth daily.       Marland Kitchen omeprazole (PRILOSEC) 20 MG capsule Take 20 mg by mouth daily.        . tamsulosin (FLOMAX) 0.4 MG CAPS Take 1 capsule (0.4 mg total) by mouth daily. 1 tab po qd  90 capsule  3   No current facility-administered medications for this visit.    History   Social History  . Marital Status: Married    Spouse Name: N/A    Number of Children: N/A  . Years of Education: N/A   Occupational History  . Not on file.   Social History Main Topics  . Smoking status: Never Smoker   . Smokeless tobacco: Not on file  . Alcohol Use: No  . Drug Use: No  . Sexual Activity: Not Currently   Other Topics Concern  . Not on file   Social History Narrative   He has never smoked or drank. No illcit drug use . He is retired from the [employer] and lives with his wife. Illicit Drug Use- no    Family History  Problem Relation Age of Onset  . Heart attack Sister   . Lung cancer Brother     smoker  . Colon cancer Neg Hx     Past Medical History  Diagnosis Date  . Left bundle  branch block   . CAD (coronary artery disease)   . Hypertension   . Gout   . Dyslipidemia   . Palpitations   . Systolic murmur   . Ejection fraction   . Lung nodules   . COPD (chronic obstructive pulmonary disease)   . Shortness of breath   . Mitral regurgitation   . Aortic insufficiency   . Shoulder pain   . Back pain     Back and knee pain, June, 2014    Past Surgical History  Procedure Laterality Date  . Back surgery    . Right shoulder    . Right knee surgery    . Tonsillectomy    . Coronary angioplasty      Patient Active Problem List   Diagnosis Date Noted  . Back pain   . Shoulder pain   . Left bundle branch block   . CAD (coronary artery disease)   . Hypertension   . Gout   . Dyslipidemia   . Palpitations   . Ejection fraction   . Lung nodules   .  COPD (chronic obstructive pulmonary disease)   . Shortness of breath   . Mitral regurgitation   . Aortic insufficiency     ROS   Patient denies fever, chills, headache, sweats, rash, change in vision, change in hearing, chest pain, cough, nausea or vomiting, urinary symptoms. All other systems are reviewed and are negative.  PHYSICAL EXAM   Patient is oriented to person time and place. Affect is normal. There is no jugulovenous distention. Lungs are clear. Respiratory effort is nonlabored. Cardiac exam reveals S1 and S2. Abdomen is soft. There is no significant peripheral edema.  Filed Vitals:   02/04/13 0936  BP: 122/68  Pulse: 67  Height: 5\' 11"  (1.803 m)  Weight: 175 lb (79.379 kg)  SpO2: 97%     ASSESSMENT & PLAN

## 2013-02-04 NOTE — Assessment & Plan Note (Signed)
He is not having any significant shortness of breath at this time. No further workup. 

## 2013-02-04 NOTE — Patient Instructions (Signed)
Your physician recommends that you continue on your current medications as directed. Please refer to the Current Medication list given to you today.  Your physician wants you to follow-up in: 6 months. You will receive a reminder letter in the mail two months in advance. If you don't receive a letter, please call our office to schedule the follow-up appointment.  

## 2013-02-04 NOTE — Assessment & Plan Note (Signed)
There was mild aortic insufficiency by echo, 2010. He does not need a followup echo.

## 2013-07-03 ENCOUNTER — Encounter: Payer: Self-pay | Admitting: Cardiology

## 2013-07-03 ENCOUNTER — Ambulatory Visit (INDEPENDENT_AMBULATORY_CARE_PROVIDER_SITE_OTHER)
Admission: RE | Admit: 2013-07-03 | Discharge: 2013-07-03 | Disposition: A | Discharge status: Home / Self Care | Payer: Medicare Other | Source: Ambulatory Visit | Attending: Cardiology | Admitting: Cardiology

## 2013-07-03 ENCOUNTER — Other Ambulatory Visit (INDEPENDENT_AMBULATORY_CARE_PROVIDER_SITE_OTHER): Payer: Medicare Other

## 2013-07-03 ENCOUNTER — Ambulatory Visit (INDEPENDENT_AMBULATORY_CARE_PROVIDER_SITE_OTHER): Payer: Medicare Other | Admitting: Cardiology

## 2013-07-03 VITALS — BP 152/66 | HR 76 | Ht 71.0 in | Wt 188.0 lb

## 2013-07-03 DIAGNOSIS — R0602 Shortness of breath: Secondary | ICD-10-CM

## 2013-07-03 DIAGNOSIS — I2510 Atherosclerotic heart disease of native coronary artery without angina pectoris: Secondary | ICD-10-CM

## 2013-07-03 DIAGNOSIS — R0989 Other specified symptoms and signs involving the circulatory and respiratory systems: Secondary | ICD-10-CM

## 2013-07-03 DIAGNOSIS — I447 Left bundle-branch block, unspecified: Secondary | ICD-10-CM

## 2013-07-03 DIAGNOSIS — R9430 Abnormal result of cardiovascular function study, unspecified: Secondary | ICD-10-CM

## 2013-07-03 DIAGNOSIS — J449 Chronic obstructive pulmonary disease, unspecified: Secondary | ICD-10-CM

## 2013-07-03 DIAGNOSIS — I10 Essential (primary) hypertension: Secondary | ICD-10-CM

## 2013-07-03 DIAGNOSIS — I498 Other specified cardiac arrhythmias: Secondary | ICD-10-CM

## 2013-07-03 DIAGNOSIS — R002 Palpitations: Secondary | ICD-10-CM

## 2013-07-03 LAB — CBC WITH DIFFERENTIAL/PLATELET
BASOS ABS: 0 10*3/uL (ref 0.0–0.1)
BASOS PCT: 0.4 % (ref 0.0–3.0)
EOS ABS: 0.2 10*3/uL (ref 0.0–0.7)
Eosinophils Relative: 2.3 % (ref 0.0–5.0)
HEMATOCRIT: 37.8 % — AB (ref 39.0–52.0)
HEMOGLOBIN: 12.9 g/dL — AB (ref 13.0–17.0)
LYMPHS ABS: 2.3 10*3/uL (ref 0.7–4.0)
LYMPHS PCT: 25.5 % (ref 12.0–46.0)
MCHC: 34 g/dL (ref 30.0–36.0)
MCV: 94.1 fl (ref 78.0–100.0)
MONOS PCT: 11 % (ref 3.0–12.0)
Monocytes Absolute: 1 10*3/uL (ref 0.1–1.0)
NEUTROS ABS: 5.5 10*3/uL (ref 1.4–7.7)
Neutrophils Relative %: 60.8 % (ref 43.0–77.0)
Platelets: 326 10*3/uL (ref 150.0–400.0)
RBC: 4.02 Mil/uL — AB (ref 4.22–5.81)
RDW: 13.2 % (ref 11.5–14.6)
WBC: 9 10*3/uL (ref 4.5–10.5)

## 2013-07-03 LAB — BASIC METABOLIC PANEL
BUN: 27 mg/dL — AB (ref 6–23)
CALCIUM: 9 mg/dL (ref 8.4–10.5)
CHLORIDE: 108 meq/L (ref 96–112)
CO2: 24 meq/L (ref 19–32)
CREATININE: 1.5 mg/dL (ref 0.4–1.5)
GFR: 46.3 mL/min — ABNORMAL LOW (ref >60.00)
Glucose, Bld: 104 mg/dL — ABNORMAL HIGH (ref 70–99)
Potassium: 4.2 mEq/L (ref 3.5–5.1)
Sodium: 140 mEq/L (ref 135–145)

## 2013-07-03 MED ORDER — FUROSEMIDE 40 MG PO TABS: 40.0000 mg | ORAL_TABLET | Freq: Two times a day (BID) | ORAL | Status: DC

## 2013-07-03 NOTE — Assessment & Plan Note (Signed)
He has been having some palpitations. In the past it was presumed that he had some atrial fibrillation but we never prove this. He has not been anticoagulated. I am not completely sure what the rhythm is today. I have chosen not to start anticoagulation.

## 2013-07-03 NOTE — Progress Notes (Signed)
HPI  Patient is seen today to followup his history of palpitations. I've seen him over several years. His ejection fraction has been in the 45% range. We have questioned some supraventricular arrhythmias but he's been stable. I saw him last October, 2014.  More recently he's had a cough and some shortness of breath. This has persisted. Today he tells me that he has some mild swelling in his ankles that he has not had before. He appears mildly short of breath sitting here.  No Known Allergies  Current Outpatient Prescriptions  Medication Sig Dispense Refill  . aspirin 81 MG tablet Take 81 mg by mouth daily.        . fenofibrate (TRICOR) 48 MG tablet Take 0.5 tablets (24 mg total) by mouth daily.  45 tablet  3  . fish oil-omega-3 fatty acids 1000 MG capsule Take 2 g by mouth daily.        . furosemide (LASIX) 40 MG tablet Take 1 tablet (40 mg total) by mouth daily.  90 tablet  3  . metoprolol succinate (TOPROL-XL) 50 MG 24 hr tablet Take 1 tablet (50 mg total) by mouth daily. Take with or immediately following a meal.  90 tablet  3  . Multiple Vitamins-Minerals (OCUVITE PO) Take 2 tablets by mouth daily.       Marland Kitchen omeprazole (PRILOSEC) 20 MG capsule Take 20 mg by mouth daily.        . tamsulosin (FLOMAX) 0.4 MG CAPS Take 1 capsule (0.4 mg total) by mouth daily. 1 tab po qd  90 capsule  3  . TEMAZEPAM PO Take by mouth at bedtime as needed.       No current facility-administered medications for this visit.    History   Social History  . Marital Status: Married    Spouse Name: N/A    Number of Children: N/A  . Years of Education: N/A   Occupational History  . Not on file.   Social History Main Topics  . Smoking status: Never Smoker   . Smokeless tobacco: Not on file  . Alcohol Use: No  . Drug Use: No  . Sexual Activity: Not Currently   Other Topics Concern  . Not on file   Social History Narrative   He has never smoked or drank. No illcit drug use . He is retired from the  [employer] and lives with his wife. Illicit Drug Use- no    Family History  Problem Relation Age of Onset  . Heart attack Sister   . Lung cancer Brother     smoker  . Colon cancer Neg Hx     Past Medical History  Diagnosis Date  . Left bundle branch block   . CAD (coronary artery disease)   . Hypertension   . Gout   . Dyslipidemia   . Palpitations   . Systolic murmur   . Ejection fraction   . Lung nodules   . COPD (chronic obstructive pulmonary disease)   . Shortness of breath   . Mitral regurgitation   . Aortic insufficiency   . Shoulder pain   . Back pain     Back and knee pain, June, 2014    Past Surgical History  Procedure Laterality Date  . Back surgery    . Right shoulder    . Right knee surgery    . Tonsillectomy    . Coronary angioplasty      Patient Active Problem List   Diagnosis  Date Noted  . Back pain   . Shoulder pain   . Left bundle branch block   . CAD (coronary artery disease)   . Hypertension   . Gout   . Dyslipidemia   . Palpitations   . Ejection fraction   . Lung nodules   . COPD (chronic obstructive pulmonary disease)   . Shortness of breath   . Mitral regurgitation   . Aortic insufficiency     ROS   Patient denies fever, chills, headache, sweats, rash, change in vision, change in hearing, chest pain, nausea or vomiting, urinary symptoms. All other systems are reviewed and are negative.  PHYSICAL EXAM  Patient is here with his son today. He appears mildly short of breath. There is no jugulovenous distention. There scattered rhonchi. There are decreased breath sounds. Cardiac exam reveals S1 and S2. The abdomen is soft. There is trace peripheral edema.  Filed Vitals:   07/03/13 1406  BP: 152/66  Pulse: 76  Height: 5\' 11"  (1.803 m)  Weight: 188 lb (85.276 kg)   EKG is done today and reviewed by me. This may be a junctional escape rhythm. There is question that it could be atrial fib and I am not convinced. There is a right  bundle-branch block. In the past he has had left bundle branch block. However after more complete review I see that it 1 time in the past there was also question of a different type of right bundle-branch block.  ASSESSMENT & PLAN

## 2013-07-03 NOTE — Assessment & Plan Note (Signed)
The rhythm today may be junctional. I'm not convinced that his atrial fib. He is not on digoxin. He is on metoprolol 50 mg daily. I will decrease the metoprolol to 25 mg daily.  I am well aware that the patient is 80 years of age. His mental status is good. He has a meaningful life. I am very hesitant to overdo his evaluation. However I feel that we need to be sure that we are treating treatable issues. As part of today's evaluation I spent greater than 25 minutes with his total care. More than half of this time has been spent with direct contact with him.

## 2013-07-03 NOTE — Assessment & Plan Note (Signed)
Historically the patient has left bundle branch block with sinus rhythm. Today the rhythm appears to possibly be junctional. There is question of a right bundle-branch block.

## 2013-07-03 NOTE — Assessment & Plan Note (Addendum)
The patient has worsening shortness of breath at this time. I believe there is some volume overload. Chest x-ray will be obtained. I'm concerned about his alternating rhythms. I want to be sure that he is not risk of high degree AV block. I will obtain a 48 hour Holter. I will arrange for followup with the electrophysiology team. I will be increasing his Lasix to 40 twice a day for now and we will arrange early followup.

## 2013-07-03 NOTE — Patient Instructions (Signed)
**Note De-Identified  Obfuscation** Your physician is referring you to EP for alternating BBB and questionable junctional rhythm (may need pacemaker)  A chest x-ray takes a picture of the organs and structures inside the chest, including the heart, lungs, and blood vessels. This test can show several things, including, whether the heart is enlarges; whether fluid is building up in the lungs; and whether pacemaker / defibrillator leads are still in place.  Your physician has requested that you have an echocardiogram. Echocardiography is a painless test that uses sound waves to create images of your heart. It provides your doctor with information about the size and shape of your heart and how well your heart's chambers and valves are working. This procedure takes approximately one hour. There are no restrictions for this procedure.  Your physician has recommended that you wear a 48 hour holter monitor. Holter monitors are medical devices that record the heart's electrical activity. Doctors most often use these monitors to diagnose arrhythmias. Arrhythmias are problems with the speed or rhythm of the heartbeat. The monitor is a small, portable device. You can wear one while you do your normal daily activities. This is usually used to diagnose what is causing palpitations/syncope (passing out).  Your physician has recommended you make the following change in your medication: increase Furosemide to 40 mg twice daily  Your physician recommends that you return for lab work in: today  Your physician recommends that you schedule a follow-up appointment in: March 25 at 10 am

## 2013-07-03 NOTE — Assessment & Plan Note (Signed)
His ejection fraction was 45-50% by echo April, 2010. He is feeling worse now. We will obtain another two-dimensional echo to reassess.

## 2013-07-03 NOTE — Assessment & Plan Note (Signed)
Patient does have COPD. His resting O2 saturation currently is 90%. He may need home oxygen over time.

## 2013-07-03 NOTE — Assessment & Plan Note (Signed)
The patient has calcified coronaries by CT scan in the past. Nuclear scan in April, 2010 revealed possible small reversible defects. I have not been convinced of severe ischemic symptoms.

## 2013-07-03 NOTE — Assessment & Plan Note (Signed)
Blood pressure is under reasonable control today.

## 2013-07-04 ENCOUNTER — Other Ambulatory Visit (HOSPITAL_COMMUNITY): Payer: Self-pay | Admitting: Radiology

## 2013-07-04 ENCOUNTER — Ambulatory Visit (HOSPITAL_COMMUNITY): Payer: Medicare Other | Attending: Cardiology | Admitting: Radiology

## 2013-07-04 DIAGNOSIS — J449 Chronic obstructive pulmonary disease, unspecified: Secondary | ICD-10-CM | POA: Insufficient documentation

## 2013-07-04 DIAGNOSIS — I359 Nonrheumatic aortic valve disorder, unspecified: Secondary | ICD-10-CM | POA: Insufficient documentation

## 2013-07-04 DIAGNOSIS — I059 Rheumatic mitral valve disease, unspecified: Secondary | ICD-10-CM | POA: Insufficient documentation

## 2013-07-04 DIAGNOSIS — I2510 Atherosclerotic heart disease of native coronary artery without angina pectoris: Secondary | ICD-10-CM | POA: Insufficient documentation

## 2013-07-04 DIAGNOSIS — E785 Hyperlipidemia, unspecified: Secondary | ICD-10-CM | POA: Insufficient documentation

## 2013-07-04 DIAGNOSIS — R002 Palpitations: Secondary | ICD-10-CM | POA: Insufficient documentation

## 2013-07-04 DIAGNOSIS — I447 Left bundle-branch block, unspecified: Secondary | ICD-10-CM | POA: Insufficient documentation

## 2013-07-04 DIAGNOSIS — R0989 Other specified symptoms and signs involving the circulatory and respiratory systems: Secondary | ICD-10-CM | POA: Insufficient documentation

## 2013-07-04 DIAGNOSIS — J4489 Other specified chronic obstructive pulmonary disease: Secondary | ICD-10-CM | POA: Insufficient documentation

## 2013-07-04 DIAGNOSIS — I10 Essential (primary) hypertension: Secondary | ICD-10-CM | POA: Insufficient documentation

## 2013-07-04 DIAGNOSIS — R0609 Other forms of dyspnea: Secondary | ICD-10-CM | POA: Insufficient documentation

## 2013-07-04 DIAGNOSIS — R079 Chest pain, unspecified: Secondary | ICD-10-CM

## 2013-07-04 DIAGNOSIS — R011 Cardiac murmur, unspecified: Secondary | ICD-10-CM | POA: Insufficient documentation

## 2013-07-04 NOTE — Progress Notes (Signed)
Echocardiogram Performed. 

## 2013-07-08 ENCOUNTER — Encounter: Payer: Self-pay | Admitting: Cardiology

## 2013-07-08 ENCOUNTER — Telehealth: Payer: Self-pay | Admitting: Cardiology

## 2013-07-08 NOTE — Telephone Encounter (Signed)
**Note De-identified  Obfuscation** The pt is advised and he verbalized understanding. 

## 2013-07-08 NOTE — Telephone Encounter (Signed)
°

## 2013-07-08 NOTE — Telephone Encounter (Signed)
Please have the patient continue to take Lasix twice daily and keep his appointment on the 25th

## 2013-07-08 NOTE — Telephone Encounter (Signed)
The pt states that he has gained 4 lbs since his last OV with Dr Ron Parker on 07/03/13.  At that OV the pt weighed 188 lbs on the office scales and he is reporting a weight of 192 lbs on his home scales from this morning, he does not weigh himself daily. At last OV the pt was advised to increase Furosemide to 40 mg twice daily and the pt states that he has increased his dose but is not urinating very much. He c/o SOB, swelling in his feet and dizziness but states that he was having these issues before his OV on 3/18. Labs were drawn at last OV and his BUN was stable at 27 and his Creatinine was 1.5. He also had a CXR that day that showed a small amount of fluid. The pt has a f/u app on 3/25 with Dr Ron Parker. Please advise.

## 2013-07-10 ENCOUNTER — Ambulatory Visit (INDEPENDENT_AMBULATORY_CARE_PROVIDER_SITE_OTHER): Payer: Medicare Other | Admitting: Cardiology

## 2013-07-10 ENCOUNTER — Encounter: Payer: Self-pay | Admitting: *Deleted

## 2013-07-10 ENCOUNTER — Encounter (INDEPENDENT_AMBULATORY_CARE_PROVIDER_SITE_OTHER): Payer: Medicare Other

## 2013-07-10 ENCOUNTER — Encounter: Payer: Self-pay | Admitting: Cardiology

## 2013-07-10 VITALS — BP 131/70 | HR 91 | Ht 71.0 in | Wt 188.0 lb

## 2013-07-10 DIAGNOSIS — I498 Other specified cardiac arrhythmias: Secondary | ICD-10-CM

## 2013-07-10 DIAGNOSIS — I2510 Atherosclerotic heart disease of native coronary artery without angina pectoris: Secondary | ICD-10-CM

## 2013-07-10 DIAGNOSIS — I10 Essential (primary) hypertension: Secondary | ICD-10-CM

## 2013-07-10 DIAGNOSIS — R9430 Abnormal result of cardiovascular function study, unspecified: Secondary | ICD-10-CM

## 2013-07-10 DIAGNOSIS — I059 Rheumatic mitral valve disease, unspecified: Secondary | ICD-10-CM

## 2013-07-10 DIAGNOSIS — I340 Nonrheumatic mitral (valve) insufficiency: Secondary | ICD-10-CM

## 2013-07-10 DIAGNOSIS — R0989 Other specified symptoms and signs involving the circulatory and respiratory systems: Secondary | ICD-10-CM

## 2013-07-10 DIAGNOSIS — R0602 Shortness of breath: Secondary | ICD-10-CM

## 2013-07-10 DIAGNOSIS — R002 Palpitations: Secondary | ICD-10-CM

## 2013-07-10 LAB — BASIC METABOLIC PANEL
BUN: 27 mg/dL — AB (ref 6–23)
CALCIUM: 8.8 mg/dL (ref 8.4–10.5)
CO2: 22 mEq/L (ref 19–32)
Chloride: 105 mEq/L (ref 96–112)
Creatinine, Ser: 1.7 mg/dL — ABNORMAL HIGH (ref 0.4–1.5)
GFR: 41.19 mL/min — AB (ref >60.00)
GLUCOSE: 132 mg/dL — AB (ref 70–99)
Potassium: 4.2 mEq/L (ref 3.5–5.1)
Sodium: 137 mEq/L (ref 135–145)

## 2013-07-10 MED ORDER — ISOSORBIDE MONONITRATE ER 30 MG PO TB24: 30.0000 mg | ORAL_TABLET | Freq: Every day | ORAL | Status: DC

## 2013-07-10 NOTE — Assessment & Plan Note (Signed)
His recent echo shows worsening of LV function since a study in 2010. His medications will need to be adjusted for this. I have added Imdur. I'm hesitant to make other changes until we know more about the followup of his renal function and his heart rate.

## 2013-07-10 NOTE — Assessment & Plan Note (Addendum)
When he was in the office on July 03, 2013 I was concerned about the possibility of a junctional rhythm. He is having a Holter placed today. He has an appointment with electrophysiology to help assess his alternating bundle and his rhythm.   As part of today's evaluation I spent greater than 25 minutes with his total care. I have carefully reviewed all of his records and tests. We have been calling him at home about his weight. I reviewed his overall status concerning his shortness of breath and I have explained to him about the decrease in his heart function. More than half of this time was spent with direct contact with him discussing all issues and making plans.

## 2013-07-10 NOTE — Progress Notes (Signed)
Patient ID: Levi Lewis, male   DOB: 04-26-1917, 80 y.o.   MRN: [id number]    HPI  Patient is seen today to followup his shortness of breath. When I saw him on July 03, 2013, he had increased shortness of breath. I was concerned that he was volume overloaded. I increased his Lasix dose. He has not had any significant diuresis. I checked his renal function that day before increasing his diuretic. His renal function was stable, but will be checked again today after taking the higher dose of diuretic. He continues to feel short of breath. He is not having PND or orthopnea. Chest x-ray was done and it does show small pleural effusion. In addition 2-D echo was done. He has worsening of his left ventricular function since a study in the past.   At the time of his last visit was also worried about his EKG. He seems to have alternating bundles. He also tells me that he has a sensation of increased heart rate when he tries to walk. I had planned for him to have a Holter monitor. It is not yet done but being placed today. I also arranged for him to have electrophysiology followup for the possibility that a pacemaker might be indicated with his alternating bundles. He has this appointment for next week. In the meantime we will obtain some Holter data.  No Known Allergies  Current Outpatient Prescriptions  Medication Sig Dispense Refill  . aspirin 81 MG tablet Take 81 mg by mouth daily.        . fenofibrate (TRICOR) 48 MG tablet Take 0.5 tablets (24 mg total) by mouth daily.  45 tablet  3  . fish oil-omega-3 fatty acids 1000 MG capsule Take 2 g by mouth daily.        . furosemide (LASIX) 40 MG tablet Take 1 tablet (40 mg total) by mouth 2 (two) times daily.  180 tablet  1  . metoprolol succinate (TOPROL-XL) 50 MG 24 hr tablet Take 1 tablet (50 mg total) by mouth daily. Take with or immediately following a meal.  90 tablet  3  . Multiple Vitamins-Minerals (OCUVITE PO) Take 2 tablets by mouth daily.       Marland Kitchen  omeprazole (PRILOSEC) 20 MG capsule Take 20 mg by mouth daily.        . triazolam (HALCION) 0.25 MG tablet as needed.      . isosorbide mononitrate (IMDUR) 30 MG 24 hr tablet Take 1 tablet (30 mg total) by mouth daily.  30 tablet  3  . tamsulosin (FLOMAX) 0.4 MG CAPS Take 1 capsule (0.4 mg total) by mouth daily. 1 tab po qd  90 capsule  3   No current facility-administered medications for this visit.    History   Social History  . Marital Status: Married    Spouse Name: N/A    Number of Children: N/A  . Years of Education: N/A   Occupational History  . Not on file.   Social History Main Topics  . Smoking status: Never Smoker   . Smokeless tobacco: Not on file  . Alcohol Use: No  . Drug Use: No  . Sexual Activity: Not Currently   Other Topics Concern  . Not on file   Social History Narrative   He has never smoked or drank. No illcit drug use . He is retired from the [employer] and lives with his wife. Illicit Drug Use- no    Family History  Problem Relation  Age of Onset  . Heart attack Sister   . Lung cancer Brother     smoker  . Colon cancer Neg Hx     Past Medical History  Diagnosis Date  . Left bundle branch block   . CAD (coronary artery disease)   . Hypertension   . Gout   . Dyslipidemia   . Palpitations   . Systolic murmur   . Ejection fraction   . Lung nodules   . COPD (chronic obstructive pulmonary disease)   . Shortness of breath   . Mitral regurgitation   . Aortic insufficiency   . Shoulder pain   . Back pain     Back and knee pain, June, 2014    Past Surgical History  Procedure Laterality Date  . Back surgery    . Right shoulder    . Right knee surgery    . Tonsillectomy    . Coronary angioplasty      Patient Active Problem List   Diagnosis Date Noted  . Junctional rhythm 07/03/2013  . Back pain   . Shoulder pain   . Left bundle branch block   . CAD (coronary artery disease)   . Hypertension   . Gout   . Dyslipidemia   .  Palpitations   . Ejection fraction   . Lung nodules   . COPD (chronic obstructive pulmonary disease)   . Shortness of breath   . Mitral regurgitation   . Aortic insufficiency     ROS   Patient denies fever, chills, headache, sweats, rash, change in vision, change in hearing, chest pain, cough, nausea vomiting, urinary symptoms. All other systems are reviewed and are negative.  PHYSICAL EXAM  Patient is here with a family member. He is oriented to person time and place. Affect is normal. He does appear to have mild shortness of breath at rest. There is no jugulovenous distention. Lungs reveal scattered rhonchi. Cardiac exam reveals S1 and S2. The abdomen is soft. There is trace peripheral edema. There no musculoskeletal deformities. There are no skin rashes.  Filed Vitals:   07/10/13 0927  BP: 131/70  Pulse: 91  Height: 5\' 11"  (1.803 m)  Weight: 188 lb (85.276 kg)    EKG  ASSESSMENT & PLAN

## 2013-07-10 NOTE — Assessment & Plan Note (Addendum)
The patient had calcified coronaries by CT scan in the past. Nuclear scan in April, 2010 revealed a possible small area of ischemia in the mid and apical anteroseptal segments. Left ventricular function is worse than recorded in the past. It is possible that he has had an ischemic event. He is not currently having chest pain. At this time I am not thinking of cardiac catheterization. This would not be completely out of the question in this 80 year old gentleman if we were to feel that he was having unrelenting symptoms from ischemia. At this point I'm not convinced that ischemia is causing his shortness of breath. However I will be adding a small dose of Imdur to see if it helps with his symptoms.

## 2013-07-10 NOTE — Assessment & Plan Note (Addendum)
His chest x-ray did show COPD and small effusions. He has not had any significant diuresis since his diuretic dose was increased. I will continue to assess his rhythm and follow his renal function on a higher dose of diuretics. No further changes until we have more information. His O2 sat in the office recently was 90%. I failed to check it today. It is possible that he is having marked decrease in his O2 sat with ambulation. He may need home O2.

## 2013-07-10 NOTE — Patient Instructions (Signed)
**Note De-Identified  Obfuscation** Your physician has recommended you make the following change in your medication: start taking Imdur 30 mg daily  Your physician recommends that you return for lab work in: today  Your physician recommends that you schedule a follow-up appointment in: as already arranged

## 2013-07-10 NOTE — Assessment & Plan Note (Signed)
He does have moderate mitral regurgitation by recent echo. I do not think that this is causing his shortness of breath.

## 2013-07-10 NOTE — Progress Notes (Signed)
Patient ID: Levi Lewis, male   DOB: March 28, 1918, 80 y.o.   MRN: [id number] E-Cardio 48 hour holter monitor applied to patient.

## 2013-07-10 NOTE — Assessment & Plan Note (Signed)
He does have some mild increase in palpitations since I saw him last. At that time I was concerned about bradycardia with the possibility of a junctional rhythm. I have lowered his beta blocker dose. No change in therapy today until we have Holter information.

## 2013-07-10 NOTE — Assessment & Plan Note (Signed)
Blood pressures control today. No change in therapy.

## 2013-07-12 ENCOUNTER — Inpatient Hospital Stay (HOSPITAL_COMMUNITY)
Admission: EM | Admit: 2013-07-12 | Discharge: 2013-07-16 | DRG: 243 | Disposition: A | Discharge status: Home / Self Care | Payer: Medicare Other | Attending: Cardiovascular Disease | Admitting: Cardiovascular Disease

## 2013-07-12 ENCOUNTER — Encounter (HOSPITAL_COMMUNITY): Payer: Self-pay | Admitting: Emergency Medicine

## 2013-07-12 ENCOUNTER — Emergency Department (HOSPITAL_COMMUNITY): Payer: Medicare Other

## 2013-07-12 DIAGNOSIS — J4489 Other specified chronic obstructive pulmonary disease: Secondary | ICD-10-CM | POA: Diagnosis present

## 2013-07-12 DIAGNOSIS — R252 Cramp and spasm: Secondary | ICD-10-CM | POA: Diagnosis not present

## 2013-07-12 DIAGNOSIS — I429 Cardiomyopathy, unspecified: Secondary | ICD-10-CM

## 2013-07-12 DIAGNOSIS — I495 Sick sinus syndrome: Secondary | ICD-10-CM | POA: Diagnosis present

## 2013-07-12 DIAGNOSIS — R002 Palpitations: Secondary | ICD-10-CM

## 2013-07-12 DIAGNOSIS — I059 Rheumatic mitral valve disease, unspecified: Secondary | ICD-10-CM

## 2013-07-12 DIAGNOSIS — R0989 Other specified symptoms and signs involving the circulatory and respiratory systems: Secondary | ICD-10-CM

## 2013-07-12 DIAGNOSIS — I447 Left bundle-branch block, unspecified: Secondary | ICD-10-CM

## 2013-07-12 DIAGNOSIS — Z801 Family history of malignant neoplasm of trachea, bronchus and lung: Secondary | ICD-10-CM

## 2013-07-12 DIAGNOSIS — IMO0002 Reserved for concepts with insufficient information to code with codable children: Secondary | ICD-10-CM

## 2013-07-12 DIAGNOSIS — I4891 Unspecified atrial fibrillation: Secondary | ICD-10-CM

## 2013-07-12 DIAGNOSIS — I5023 Acute on chronic systolic (congestive) heart failure: Principal | ICD-10-CM

## 2013-07-12 DIAGNOSIS — E785 Hyperlipidemia, unspecified: Secondary | ICD-10-CM | POA: Diagnosis present

## 2013-07-12 DIAGNOSIS — I4729 Other ventricular tachycardia: Secondary | ICD-10-CM | POA: Diagnosis present

## 2013-07-12 DIAGNOSIS — I340 Nonrheumatic mitral (valve) insufficiency: Secondary | ICD-10-CM

## 2013-07-12 DIAGNOSIS — I2510 Atherosclerotic heart disease of native coronary artery without angina pectoris: Secondary | ICD-10-CM | POA: Diagnosis present

## 2013-07-12 DIAGNOSIS — I472 Ventricular tachycardia, unspecified: Secondary | ICD-10-CM | POA: Diagnosis present

## 2013-07-12 DIAGNOSIS — Z9861 Coronary angioplasty status: Secondary | ICD-10-CM

## 2013-07-12 DIAGNOSIS — J449 Chronic obstructive pulmonary disease, unspecified: Secondary | ICD-10-CM | POA: Diagnosis present

## 2013-07-12 DIAGNOSIS — I129 Hypertensive chronic kidney disease with stage 1 through stage 4 chronic kidney disease, or unspecified chronic kidney disease: Secondary | ICD-10-CM | POA: Diagnosis present

## 2013-07-12 DIAGNOSIS — M109 Gout, unspecified: Secondary | ICD-10-CM | POA: Diagnosis present

## 2013-07-12 DIAGNOSIS — Z79899 Other long term (current) drug therapy: Secondary | ICD-10-CM

## 2013-07-12 DIAGNOSIS — I509 Heart failure, unspecified: Secondary | ICD-10-CM

## 2013-07-12 DIAGNOSIS — Z8249 Family history of ischemic heart disease and other diseases of the circulatory system: Secondary | ICD-10-CM

## 2013-07-12 DIAGNOSIS — I428 Other cardiomyopathies: Secondary | ICD-10-CM | POA: Diagnosis present

## 2013-07-12 DIAGNOSIS — I5043 Acute on chronic combined systolic (congestive) and diastolic (congestive) heart failure: Secondary | ICD-10-CM | POA: Diagnosis present

## 2013-07-12 DIAGNOSIS — I351 Nonrheumatic aortic (valve) insufficiency: Secondary | ICD-10-CM

## 2013-07-12 DIAGNOSIS — Z7982 Long term (current) use of aspirin: Secondary | ICD-10-CM

## 2013-07-12 DIAGNOSIS — I079 Rheumatic tricuspid valve disease, unspecified: Secondary | ICD-10-CM | POA: Diagnosis present

## 2013-07-12 DIAGNOSIS — N184 Chronic kidney disease, stage 4 (severe): Secondary | ICD-10-CM | POA: Diagnosis present

## 2013-07-12 DIAGNOSIS — R9430 Abnormal result of cardiovascular function study, unspecified: Secondary | ICD-10-CM

## 2013-07-12 DIAGNOSIS — I10 Essential (primary) hypertension: Secondary | ICD-10-CM | POA: Diagnosis present

## 2013-07-12 DIAGNOSIS — I080 Rheumatic disorders of both mitral and aortic valves: Secondary | ICD-10-CM | POA: Diagnosis present

## 2013-07-12 DIAGNOSIS — I2584 Coronary atherosclerosis due to calcified coronary lesion: Secondary | ICD-10-CM | POA: Diagnosis present

## 2013-07-12 DIAGNOSIS — R0602 Shortness of breath: Secondary | ICD-10-CM

## 2013-07-12 DIAGNOSIS — I452 Bifascicular block: Secondary | ICD-10-CM

## 2013-07-12 HISTORY — DX: Unspecified right bundle-branch block: I45.10

## 2013-07-12 HISTORY — DX: Cardiomyopathy, unspecified: I42.9

## 2013-07-12 HISTORY — DX: Unspecified atrial fibrillation: I48.91

## 2013-07-12 LAB — CBC WITH DIFFERENTIAL/PLATELET
Basophils Absolute: 0 10*3/uL (ref 0.0–0.1)
Basophils Relative: 0 % (ref 0–1)
Eosinophils Absolute: 0.2 10*3/uL (ref 0.0–0.7)
Eosinophils Relative: 2 % (ref 0–5)
HCT: 35.8 % — ABNORMAL LOW (ref 39.0–52.0)
Hemoglobin: 12.4 g/dL — ABNORMAL LOW (ref 13.0–17.0)
LYMPHS ABS: 2.3 10*3/uL (ref 0.7–4.0)
LYMPHS PCT: 26 % (ref 12–46)
MCH: 32 pg (ref 26.0–34.0)
MCHC: 34.6 g/dL (ref 30.0–36.0)
MCV: 92.3 fL (ref 78.0–100.0)
Monocytes Absolute: 1 10*3/uL (ref 0.1–1.0)
Monocytes Relative: 11 % (ref 3–12)
NEUTROS ABS: 5.6 10*3/uL (ref 1.7–7.7)
Neutrophils Relative %: 61 % (ref 43–77)
PLATELETS: 282 10*3/uL (ref 150–400)
RBC: 3.88 MIL/uL — AB (ref 4.22–5.81)
RDW: 13.9 % (ref 11.5–15.5)
WBC: 9.1 10*3/uL (ref 4.0–10.5)

## 2013-07-12 LAB — CBC
HEMATOCRIT: 37 % — AB (ref 39.0–52.0)
Hemoglobin: 12.9 g/dL — ABNORMAL LOW (ref 13.0–17.0)
MCH: 32.5 pg (ref 26.0–34.0)
MCHC: 34.9 g/dL (ref 30.0–36.0)
MCV: 93.2 fL (ref 78.0–100.0)
Platelets: 295 10*3/uL (ref 150–400)
RBC: 3.97 MIL/uL — ABNORMAL LOW (ref 4.22–5.81)
RDW: 13.9 % (ref 11.5–15.5)
WBC: 8.2 10*3/uL (ref 4.0–10.5)

## 2013-07-12 LAB — CREATININE, SERUM
CREATININE: 1.44 mg/dL — AB (ref 0.50–1.35)
GFR, EST AFRICAN AMERICAN: 46 mL/min — AB (ref >90)
GFR, EST NON AFRICAN AMERICAN: 40 mL/min — AB (ref >90)

## 2013-07-12 LAB — BASIC METABOLIC PANEL
BUN: 32 mg/dL — ABNORMAL HIGH (ref 6–23)
CO2: 19 meq/L (ref 19–32)
Calcium: 8.8 mg/dL (ref 8.4–10.5)
Chloride: 98 mEq/L (ref 96–112)
Creatinine, Ser: 1.44 mg/dL — ABNORMAL HIGH (ref 0.50–1.35)
GFR calc Af Amer: 46 mL/min — ABNORMAL LOW (ref >90)
GFR calc non Af Amer: 40 mL/min — ABNORMAL LOW (ref >90)
GLUCOSE: 112 mg/dL — AB (ref 70–99)
Potassium: 4.3 mEq/L (ref 3.7–5.3)
Sodium: 134 mEq/L — ABNORMAL LOW (ref 137–147)

## 2013-07-12 LAB — TSH: TSH: 6.408 u[IU]/mL — AB (ref 0.350–4.500)

## 2013-07-12 LAB — TROPONIN I

## 2013-07-12 LAB — PRO B NATRIURETIC PEPTIDE: PRO B NATRI PEPTIDE: 5240 pg/mL — AB (ref 0–450)

## 2013-07-12 MED ORDER — SODIUM CHLORIDE 0.9 % IJ SOLN
3.0000 mL | Freq: Two times a day (BID) | INTRAMUSCULAR | Status: DC
  Administered 2013-07-12 – 2013-07-14 (×6): 3 mL via INTRAVENOUS

## 2013-07-12 MED ORDER — TAMSULOSIN HCL 0.4 MG PO CAPS
0.4000 mg | ORAL_CAPSULE | Freq: Every day | ORAL | Status: DC
  Administered 2013-07-12 – 2013-07-16 (×5): 0.4 mg via ORAL
  Filled 2013-07-12 (×5): qty 1

## 2013-07-12 MED ORDER — BIOTENE DRY MOUTH MT LIQD
15.0000 mL | Freq: Two times a day (BID) | OROMUCOSAL | Status: DC
  Administered 2013-07-13 – 2013-07-16 (×4): 15 mL via OROMUCOSAL

## 2013-07-12 MED ORDER — SODIUM CHLORIDE 0.9 % IV SOLN: 250.0000 mL | INTRAVENOUS | Status: DC | PRN

## 2013-07-12 MED ORDER — ASPIRIN 81 MG PO TABS
81.0000 mg | ORAL_TABLET | Freq: Every day | ORAL | Status: DC
  Filled 2013-07-12: qty 1

## 2013-07-12 MED ORDER — OMEGA-3 FATTY ACIDS 1000 MG PO CAPS
2.0000 g | ORAL_CAPSULE | Freq: Every day | ORAL | Status: DC
  Administered 2013-07-12 – 2013-07-14 (×3): 2 g via ORAL
  Filled 2013-07-12 (×5): qty 2

## 2013-07-12 MED ORDER — ONDANSETRON 4 MG PO TBDP
4.0000 mg | ORAL_TABLET | Freq: Once | ORAL | Status: AC
  Administered 2013-07-12: 4 mg via ORAL

## 2013-07-12 MED ORDER — FUROSEMIDE 10 MG/ML IJ SOLN
80.0000 mg | Freq: Two times a day (BID) | INTRAMUSCULAR | Status: DC
  Administered 2013-07-12 – 2013-07-14 (×5): 80 mg via INTRAVENOUS
  Filled 2013-07-12 (×8): qty 8

## 2013-07-12 MED ORDER — CHLORHEXIDINE GLUCONATE 0.12 % MT SOLN
15.0000 mL | Freq: Two times a day (BID) | OROMUCOSAL | Status: DC
  Administered 2013-07-13 – 2013-07-16 (×7): 15 mL via OROMUCOSAL
  Filled 2013-07-12 (×9): qty 15

## 2013-07-12 MED ORDER — SALINE SPRAY 0.65 % NA SOLN
1.0000 | NASAL | Status: DC | PRN
  Administered 2013-07-13: 1 via NASAL
  Filled 2013-07-12 (×2): qty 44

## 2013-07-12 MED ORDER — ONDANSETRON HCL 4 MG PO TABS: 4.0000 mg | ORAL_TABLET | Freq: Once | ORAL | Status: DC

## 2013-07-12 MED ORDER — ONDANSETRON 4 MG PO TBDP
ORAL_TABLET | ORAL | Status: AC
  Filled 2013-07-12: qty 1

## 2013-07-12 MED ORDER — PANTOPRAZOLE SODIUM 40 MG PO TBEC
40.0000 mg | DELAYED_RELEASE_TABLET | Freq: Every day | ORAL | Status: DC
  Administered 2013-07-12 – 2013-07-16 (×5): 40 mg via ORAL
  Filled 2013-07-12 (×6): qty 1

## 2013-07-12 MED ORDER — OCUVITE PO TABS
1.0000 | ORAL_TABLET | Freq: Every day | ORAL | Status: DC
  Administered 2013-07-12 – 2013-07-16 (×5): 1 via ORAL
  Filled 2013-07-12 (×5): qty 1

## 2013-07-12 MED ORDER — FUROSEMIDE 10 MG/ML IJ SOLN
80.0000 mg | Freq: Once | INTRAMUSCULAR | Status: AC
  Administered 2013-07-12: 80 mg via INTRAVENOUS
  Filled 2013-07-12: qty 8

## 2013-07-12 MED ORDER — SODIUM CHLORIDE 0.9 % IJ SOLN: 3.0000 mL | INTRAMUSCULAR | Status: DC | PRN

## 2013-07-12 MED ORDER — FENOFIBRATE 54 MG PO TABS
54.0000 mg | ORAL_TABLET | Freq: Every day | ORAL | Status: DC
  Administered 2013-07-12 – 2013-07-16 (×5): 54 mg via ORAL
  Filled 2013-07-12 (×5): qty 1

## 2013-07-12 MED ORDER — ASPIRIN 81 MG PO CHEW
81.0000 mg | CHEWABLE_TABLET | Freq: Every day | ORAL | Status: DC
  Administered 2013-07-12 – 2013-07-16 (×5): 81 mg via ORAL
  Filled 2013-07-12 (×5): qty 1

## 2013-07-12 MED ORDER — HEPARIN SODIUM (PORCINE) 5000 UNIT/ML IJ SOLN
5000.0000 [IU] | Freq: Three times a day (TID) | INTRAMUSCULAR | Status: AC
  Administered 2013-07-12 – 2013-07-14 (×8): 5000 [IU] via SUBCUTANEOUS
  Filled 2013-07-12 (×8): qty 1

## 2013-07-12 MED ORDER — TRIAZOLAM 0.25 MG PO TABS: 0.2500 mg | ORAL_TABLET | Freq: Every evening | ORAL | Status: DC | PRN

## 2013-07-12 NOTE — Care Management Note (Addendum)
  Page 2 of 2   07/16/2013     2:45:12 PM   CARE MANAGEMENT NOTE 07/16/2013  Patient:  Levi Lewis, Levi Lewis   Account Number:  [id number]  Date Initiated:  07/12/2013  Documentation initiated by:  [facility]  Subjective/Objective Assessment:   63M with CAD, alternating BBB, mod MR, HFrEF (25-30%) , COPD who is admitted with ADHF despite outpatient diuretic augmentation./ Home with spouse     Action/Plan:   IV diureses, oxygen therapy//Access for Home Health needs   Anticipated DC Date:  07/15/2013   Anticipated DC Plan:  [place]  CM consult      Choice offered to / List presented to:             Status of service:  Completed, signed off Medicare Important Message given?   (If response is "NO", the following Medicare IM given date fields will be blank) Date Medicare IM given:   Date Additional Medicare IM given:    Discharge Disposition:  HOME/SELF CARE  Per UR Regulation:  Reviewed for med. necessity/level of care/duration of stay  If discussed at [place] of Stay Meetings, dates discussed:   07/16/2013    Comments:  07/16/2013 Disposition:  d/c home / self care Remington Skalsky RN, BSN, MSHL, CCM 07/16/2013  07/15/2013 LOS # 3 IOS:  Plan for CRT-P implantation today. Last IV Lasix dose am of 3/29 and on po Lasix since that time. O2 3 L and lowest sat 93% PT RECS:  None Dispositon Plan:  [place] RN, BSN, [place], [place] 07/15/2013

## 2013-07-12 NOTE — ED Notes (Signed)
PT scooted from ED stretcher to hospital bed and o2 sat dropped to 90% on 3 l [place] for ~40sec. And then returned to 97%

## 2013-07-12 NOTE — ED Provider Notes (Signed)
CSN: [id number]     Arrival date & time 07/12/13  0157 History   First MD Initiated Contact with Patient 07/12/13 0200     Chief Complaint  Patient presents with  . Shortness of Breath      HPI Patient reports increasing shortness of breath her the past several days.  His shortness of breath worsened this evening and he called EMS.  He has been followed closely by his cardiology team in [place] cardiologist 2 days ago for what was thought to be CHF exacerbation.  Patient is currently taking 40 mg of Lasix twice a day.  It sounds as though his last echocardiogram obtained this month was worse than his priors.  I do not have an ejection fraction available to me at this time.  EMS reports labored breathing on arrival and seemed to improve with placement of oxygen.  Patient reports his shortness but feels slightly better at this time.  He reports orthopnea and dyspnea with exertion.  No active chest pain at this time.  No recent cough or congestion.  No fevers or chills.  He has had new edema in his feet and ankles bilaterally per the patient.   Past Medical History  Diagnosis Date  . Left bundle branch block   . CAD (coronary artery disease)   . Hypertension   . Gout   . Dyslipidemia   . Palpitations   . Systolic murmur   . Ejection fraction   . Lung nodules   . COPD (chronic obstructive pulmonary disease)   . Shortness of breath   . Mitral regurgitation   . Aortic insufficiency   . Shoulder pain   . Back pain     Back and knee pain, June, 2014   Past Surgical History  Procedure Laterality Date  . Back surgery    . Right shoulder    . Right knee surgery    . Tonsillectomy    . Coronary angioplasty     Family History  Problem Relation Age of Onset  . Heart attack Sister   . Lung cancer Brother     smoker  . Colon cancer Neg Hx    History  Substance Use Topics  . Smoking status: Never Smoker   . Smokeless tobacco: Not on file  . Alcohol Use: No    Review of Systems  All  other systems reviewed and are negative.      Allergies  Review of patient's allergies indicates no known allergies.  Home Medications   Current Outpatient Rx  Name  Route  Sig  Dispense  Refill  . aspirin 81 MG tablet   Oral   Take 81 mg by mouth daily.           . fenofibrate (TRICOR) 48 MG tablet   Oral   Take 0.5 tablets (24 mg total) by mouth daily.   45 tablet   3   . fish oil-omega-3 fatty acids 1000 MG capsule   Oral   Take 2 g by mouth daily.           . furosemide (LASIX) 40 MG tablet   Oral   Take 1 tablet (40 mg total) by mouth 2 (two) times daily.   180 tablet   1     Increase in dose   . isosorbide mononitrate (IMDUR) 30 MG 24 hr tablet   Oral   Take 1 tablet (30 mg total) by mouth daily.   30 tablet   3   .  metoprolol succinate (TOPROL-XL) 50 MG 24 hr tablet   Oral   Take 1 tablet (50 mg total) by mouth daily. Take with or immediately following a meal.   90 tablet   3   . Multiple Vitamins-Minerals (OCUVITE PO)   Oral   Take 2 tablets by mouth daily.          Marland Kitchen omeprazole (PRILOSEC) 20 MG capsule   Oral   Take 20 mg by mouth daily.           . tamsulosin (FLOMAX) 0.4 MG CAPS   Oral   Take 1 capsule (0.4 mg total) by mouth daily. 1 tab po qd   90 capsule   3   . triazolam (HALCION) 0.25 MG tablet   Oral   Take 0.25 mg by mouth at bedtime as needed for sleep.           BP 123/75  Pulse 82  Temp(Src) 98.5 F (36.9 C) (Temporal)  Resp 25  SpO2 93% Physical Exam  Nursing note and vitals reviewed. Constitutional: He is oriented to person, place, and time. He appears well-developed and well-nourished.  HENT:  Head: Normocephalic and atraumatic.  Eyes: EOM are normal.  Neck: Normal range of motion.  Cardiovascular: Normal rate, regular rhythm, normal heart sounds and intact distal pulses.   Pulmonary/Chest: Effort normal and breath sounds normal. No respiratory distress.  Rales in the bases bilaterally  Abdominal:  Soft. He exhibits no distension. There is no tenderness.  Musculoskeletal: Normal range of motion.  Trace edema in his ankles bilaterally  Neurological: He is alert and oriented to person, place, and time.  Skin: Skin is warm and dry.  Psychiatric: He has a normal mood and affect. Judgment normal.    ED Course  Procedures (including critical care time) Labs Review Labs Reviewed  CBC WITH DIFFERENTIAL - Abnormal; Notable for the following:    RBC 3.88 (*)    Hemoglobin 12.4 (*)    HCT 35.8 (*)    All other components within normal limits  BASIC METABOLIC PANEL - Abnormal; Notable for the following:    Sodium 134 (*)    Glucose, Bld 112 (*)    BUN 32 (*)    Creatinine, Ser 1.44 (*)    GFR calc non Af Amer 40 (*)    GFR calc Af Amer 46 (*)    All other components within normal limits  PRO B NATRIURETIC PEPTIDE - Abnormal; Notable for the following:    Pro B Natriuretic peptide (BNP) 5240.0 (*)    All other components within normal limits  TROPONIN I   Imaging Review Dg Chest 2 View  07/12/2013   CLINICAL DATA:  Shortness of breath.  EXAM: CHEST  2 VIEW  COMPARISON:  07/03/2013  FINDINGS: The cardiac silhouette is likely within normal limits for size for AP technique. Small bilateral pleural effusions do not appear significantly changed. Interstitial markings are again prominent bilaterally. Bibasilar opacities appear mildly increased compared to the prior study. No pneumothorax is identified. No acute osseous abnormality is seen.  IMPRESSION: Persistent, small bilateral pleural effusions with mildly increased basilar predominant lung infiltrates versus edema.   Electronically Signed   By: Logan Bores   On: 07/12/2013 03:13  /kcdata    EKG Interpretation   Date/Time:  Friday July 12 2013 01:53:21 EDT Ventricular Rate:  86 PR Interval:    QRS Duration: 160 QT Interval:  444 QTC Calculation: 531 R Axis:   -75 Text Interpretation:  Sinus rhythm with first degree AV block Left  bundle  branch block Left ventricular hypertrophy No significant change was found  Confirmed by Idus Rathke  MD, Lennette Bihari ([id number]) on 07/12/2013 3:19:46 AM      MDM   Final diagnoses:  None    Patient presents CHF exacerbation.  Elevated BNP.  Edema on chest x-ray.  My suspicion for pneumonia is low.  IV Lasix now.  Patient will need to be admitted to the cardiology team.    Hoy Morn, MD 07/12/13 [phone]

## 2013-07-12 NOTE — ED Notes (Addendum)
Per EMS, pt reports being SOB "alot" lately, but said that today he has become severly SOB. Lungs clear, pt had labored breathing upon EMS arrival. EMS placed nasal cannula on pt which only brought his SpO2: 91-92%. Pt was then placed on NRB which brought O2 to 98%. Pt recently evaluated for ECG changes. Pt reports he believes that he had a heart attack that was not treated. Pt denies CP. Pt has hx of CHF and COPD.

## 2013-07-12 NOTE — Progress Notes (Signed)
Pt complaining of leg cramping. Pt has received 3 doses of 80IV lasix since admission and no potassium replacement. Dr. Elias Else notified and order for BMET and Magnesium to be drawn. Verbal order to give 60mEq potassium PO if potassium less than 3.7 and to call MD if potassium less than 3.0. Oncoming RN notified. Will continue to monitor. Ronnette Hila, RN

## 2013-07-12 NOTE — ED Notes (Signed)
Updated PT on his care, repositioned pt, ordered hospital bed

## 2013-07-12 NOTE — ED Notes (Signed)
Pt placed on 3L [place] at this time.

## 2013-07-12 NOTE — Progress Notes (Signed)
Patient Name: Levi Lewis Date of Encounter: 07/12/2013  Principal Problem:   Acute on chronic systolic CHF (congestive heart failure) Active Problems:   CAD (coronary artery disease)   Hypertension   COPD (chronic obstructive pulmonary disease)   Aortic insufficiency   Left bundle branch block   Gout   Dyslipidemia   Mitral regurgitation   CKD III   SUBJECTIVE 80 y.o. gentleman resting comfortably in the ED. Head of bed currently at 60 degrees. Patient in NAD wearing 3 L [place].  Feeling much better than on presentation.  CURRENT MEDS . aspirin  81 mg Oral Daily  . beta carotene w/minerals  1 tablet Oral Daily  . fenofibrate  54 mg Oral Daily  . fish oil-omega-3 fatty acids  2 g Oral Daily  . furosemide  80 mg Intravenous BID  . heparin  5,000 Units Subcutaneous 3 times per day  . pantoprazole  40 mg Oral Daily  . tamsulosin  0.4 mg Oral Daily    OBJECTIVE  Filed Vitals:   07/12/13 0815 07/12/13 1015 07/12/13 1045 07/12/13 1130  BP: 124/63 118/64 129/69 134/98  Pulse: 71 71 43 90  Temp:      TempSrc:      Resp: 23 31 23    SpO2: 96% 91% 96% 96%    Intake/Output Summary (Last 24 hours) at 07/12/13 1136 Last data filed at 07/12/13 1104  Gross per 24 hour  Intake    550 ml  Output   2090 ml  Net  -1540 ml   There were no vitals filed for this visit.  PHYSICAL EXAM  General: Pleasant, NAD. Neuro: Alert and oriented X 3. Moves all extremities spontaneously. Psych: Normal affect. HEENT:  [place], AT. No hearing aides. Wearing glasses. Sclera clear. Conjunctiva pale but moist. No discharge. Turbinates non-injecteed. Buccal mucosa moist, pale. Oropharynx non-edemitious.  Neck: JVD to jaw. Supple without bruits. Carotid upstroke 2+.  Lungs:  Resp regular, shallow - diminished. Bilateral basilar crackles.  Heart: Systolic murmur heard over pulmonic valve. 3/6.  RRR no s3, s4. Cap refill > 3 sec.  Abdomen: Distended, non-tender, BS + x 4.  Extremities: Edema 1-2+ in  bilateral LE.  No clubbing, cyanosis. DP/PT/Radials 2+ and equal bilaterally.  Accessory Clinical Findings  CBC  Recent Labs  07/12/13 0155  WBC 9.1  NEUTROABS 5.6  HGB 12.4*  HCT 35.8*  MCV 92.3  PLT 161   Basic Metabolic Panel  Recent Labs  07/10/13 1033 07/12/13 0155  NA 137 134*  K 4.2 4.3  CL 105 98  CO2 22 19  GLUCOSE 132* 112*  BUN 27* 32*  CREATININE 1.7* 1.44*  CALCIUM 8.8 8.8   Cardiac Enzymes  Recent Labs  07/12/13 0155  TROPONINI <0.30   TELE  ? afib - 70's to 1-teens (when he gets up)  ECG NSR, 86, lbbb, left axis (old ecg from 3/18 showed rbbb).  Radiology/Studies  Dg Chest 2 View  07/12/2013   CLINICAL DATA:  Shortness of breath.  EXAM: CHEST  2 VIEW  COMPARISON:  07/03/2013  FINDINGS: The cardiac silhouette is likely within normal limits for size for AP technique. Small bilateral pleural effusions do not appear significantly changed. Interstitial markings are again prominent bilaterally. Bibasilar opacities appear mildly increased compared to the prior study. No pneumothorax is identified. No acute osseous abnormality is seen.  IMPRESSION: Persistent, small bilateral pleural effusions with mildly increased basilar predominant lung infiltrates versus edema.   Electronically Signed   By:  Logan Bores   On: 07/12/2013 03:13   ASSESSMENT AND PLAN Levi Lewis is a 76M with CAD, alternating BBB, mod MR, HFrEF (25-30%) , COPD who was admitted this morning with ADHF despite outpatient diuretic augmentation. He has been held in the ED until a room could be found for him. He reported 8 lbs weight gain over the past week with associated dry cough, worsening LE edema, PND, and orthopnea pillows x3. He monitors his blood pressure from home. He began to experience frequent palpitations with a irregular HR in 150's. He received a Holter Monitor 3/25   These worsening symptoms brought him to [facility]. Upon arrival, his VS measured: HR 82, BP 107/64, 93% on RA. Labs: BNP  5240, Cr 1.44. He was dyspneic and unable to speak full sentences. 80 mg of Lasix I.V. was administered was and cardiology consulted. Dr. Tommi Rumps saw patient earlier this morning.  Since administration of Lasix, patient has produced 1500 cc of urine, and is breathing comfortably on 3 L [place]. His last two BP have measured 111/64, and 124/61, HR regular at 70, SpO2 92 % 3 L [place]. TSH is pending. Troponin x1 negative. BS 112. Home meds include tricor, lasix, imdur, toprol-XL, prilosec, flomax, halcion.  A room is now available for Levi Lewis. He will be moved shortly.    1.  Acute on chronic systolic CHF: > Move to tele monitored bed.  > Cycle troponin x2.  First set <0.30.  > Review TSH when available.  > Continue diuresis. Continues to appear fluid overloaded. Received 80 mg I.V. Lasix with positive outcome. Creat 1.44. Down from 1.7.  > Daily weights. Currently 188 lbs. Reports regular weight 179-180 lbs.  > Heart healthy diet  > Continue to monitor CBC and BMP.   Signed, Hulen Luster, [employer] STUDENT- NP  Addendum:  Pt seen and examined with Ms. Sanders.  All notes reviewed.  Pt has a h/o cardiomyopathy with previous EF of 45% in 24-Jul-2008, with more pronounced LV dysfxn by echo earlier this month - 25-30% (mild AI, mod MR/TR).  He has been experiencing palpitations and worsening dyspnea with volume overload recently.  He was seen by Dr. Ron Parker on 3/18 with titration of lasix and then again on 3/25 at which point LV dysfxn was noted.  Lasix dose was maintained at the visit on the 25th and arrangements were made for a 48 hr holter monitor in the setting of tachypalps.  EP f/u was also arranged.  He was placed on imdur, which gave him a headache and his dyspnea has subsequently worsened.  He presented to the ED overnight with dyspnea and was found to be in acute CHF.  He has been placed on IV lasix with good response and symptomatic improvement.  Creat improved with diuresis.  He has intermittent  tachycardia and ecg suggests afib, though at times, he is more regular and a long PR interval can be traced, though inconsistently.  He also has frequent pvc's, couplets, and a few runs of nsvt up to 5 beats.  1. Acute on chronic systolic chf: pt with progressive volume overload, dyspnea, increasing abd girth, lee, and early satiety in the setting of recent finding of worsening lv dysfxn with ef of 25-30% by echo.  He is feeling better this morning with diuresis. Cont IV lasix.  Follow creat.  It is not clear as to why his EF is down.  Per Dr. Kae Heller note, he has a h/o coronary calcification on CT but  has not had chest pain.  He is an active gentleman and further ischemic w/u is not out of the realm of possibility however with CKD, now is not the time to be thinking about cath.  He is not on acei/arb 2/2 ckd.  He is on a bb @ home however this was held in the setting of intermittent bradycardia and concern re: conduction dzs in the setting of alternating bbb, ? High grade 1st deg avb.  2.  ? PAF:  Admission ecg suggest afib, as does tele, though at times he is fairly regular in the 70's w/o obvious p waves and at other times he appears to have a very wide 1st deg avb.  Will ask EP to eval ecg's, as this was planned to be done as an outpt.  If not clear, could consider limited echo to visualize the atria. Would defer any decision re: anticoagulation to Dr. Ron Parker, who knows him best.  3.  Palpitations:  Based on pts description and what i'm seeing on tele, I think he has symptomatic PVC's.  He reports daily tachycardia, usually when getting up in the morning, with rates to 140's.  He also describes sudden 'flips' with the sensation that his heart has stopped for a second.  He has freq pvc's on tele with couples and nsvt.  Bb currently on hold as outlined above.  Check Mg.  4.  Stage III CKD:  Creat improved with diuresis.  Follow.  5.  Alternating RBBB/LBBB:  EP to eval.  6.  HTN:  Stable.  Murray Hodgkins, NP 1:44 PM, 07/12/2013  Patient seen, examined. Available data reviewed. Agree with findings, assessment, and plan as outlined by Ignacia Bayley, NP. The patient is a very nice elderly gentleman followed closely by JK. Clinically he remains volume-overloaded and would continue IV diuretics as above. Will ask the EP team to see him regarding complex rhythm issues.   Sherren Mocha, M.D. 07/12/2013 6:39 PM

## 2013-07-12 NOTE — Progress Notes (Signed)
Pt HR up to 130-140 sustaining when using urinal standing at side of bed. HR down to 70-90 when pt returned to bed. Pt short of breath with exertion. Pt asymptomatic otherwise. Will continue to monitor. Ronnette Hila, RN

## 2013-07-12 NOTE — ED Notes (Signed)
Admitting MD at bedside.

## 2013-07-12 NOTE — ED Notes (Signed)
Called pharmacy to have meds sent up for PT

## 2013-07-12 NOTE — ED Notes (Signed)
Patient transported to X-ray 

## 2013-07-12 NOTE — H&P (Addendum)
Patient ID: Levi Lewis MRN: [id number], DOB/AGE: [birth year]   Admit date: 07/12/2013   Primary Physician: Enrique Sack, MD Primary Cardiologist: Willa Rough, MD  Pt. Profile: 80M with CAD, alternating BBB, mod MR, HFrEF (25-30%) , COPD who is admitted with ADHF despite outpatient diuretic augmentation.   Problem List  Past Medical History  Diagnosis Date  . Left bundle branch block   . CAD (coronary artery disease)   . Hypertension   . Gout   . Dyslipidemia   . Palpitations   . Systolic murmur   . Ejection fraction   . Lung nodules   . COPD (chronic obstructive pulmonary disease)   . Shortness of breath   . Mitral regurgitation   . Aortic insufficiency   . Shoulder pain   . Back pain     Back and knee pain, June, 2014    Past Surgical History  Procedure Laterality Date  . Back surgery    . Right shoulder    . Right knee surgery    . Tonsillectomy    . Coronary angioplasty       Allergies  No Known Allergies  HPI  80M with CAD, alternating BBB, mod MR, HFrEF (25-30%) , COPD who is admitted with ADHF despite outpatient diuretic augmentation.   Levi Lewis reports a 10-14 day history of worsening symptoms. Describes dry cough, increased LE edema (over past week), PND, 3 pillow orthopnea, and 7lbs of weight gain over that interval. Describes frequent palpitations where his HR can climb to the 150s (based on BP reading equipment at home) and frequently has an irregular heartbeat.  He has been followed closely by Dr. Graciela Husbands who has doubled his furosemide dose - but without increased in UOP per the patient. Dr. Graciela Husbands has noted that on occasion the LBBB is at time replaced by a RBBB like QRS morphology raising the concern for severe infranodal conduction system disease. Plan was for holter monitor and EP follow-up shortly. Based on recent drop in EF in setting of possible CAD he was started on a long acting nitrate; he reports he felt dizzy after taking this  medication.   In ED, HR 82, BP 107/64, 93% on RA. BNP 5240 (last 303 on 10/01/12)  Cr 1.44. He was visibly uncomfortable while breathing and had difficult completing full sentences. He was given 80mg  of IV lasix and cardiology was consulted for admission.    Home Medications  Prior to Admission medications   Medication Sig Start Date End Date Taking? Authorizing Provider  aspirin 81 MG tablet Take 81 mg by mouth daily.     Yes Historical Provider, MD  fenofibrate (TRICOR) 48 MG tablet Take 0.5 tablets (24 mg total) by mouth daily. 10/09/12  Yes Luis Abed, MD  fish oil-omega-3 fatty acids 1000 MG capsule Take 2 g by mouth daily.     Yes Historical Provider, MD  furosemide (LASIX) 40 MG tablet Take 1 tablet (40 mg total) by mouth 2 (two) times daily. 07/03/13  Yes Luis Abed, MD  isosorbide mononitrate (IMDUR) 30 MG 24 hr tablet Take 1 tablet (30 mg total) by mouth daily. 07/10/13  Yes Luis Abed, MD  metoprolol succinate (TOPROL-XL) 50 MG 24 hr tablet Take 1 tablet (50 mg total) by mouth daily. Take with or immediately following a meal. 08/21/12  Yes Luis Abed, MD  Multiple Vitamins-Minerals (OCUVITE PO) Take 2 tablets by mouth daily.    Yes Historical Provider, MD  omeprazole (PRILOSEC) 20 MG capsule Take 20 mg by mouth daily.     Yes Historical Provider, MD  tamsulosin (FLOMAX) 0.4 MG CAPS Take 1 capsule (0.4 mg total) by mouth daily. 1 tab po qd 10/09/12  Yes Carlena Bjornstad, MD  triazolam (HALCION) 0.25 MG tablet Take 0.25 mg by mouth at bedtime as needed for sleep.  06/19/13  Yes Historical Provider, MD    Family History  Family History  Problem Relation Age of Onset  . Heart attack Sister   . Lung cancer Brother     smoker  . Colon cancer Neg Hx     Social History  History   Social History  . Marital Status: Married    Spouse Name: N/A    Number of Children: N/A  . Years of Education: N/A   Occupational History  . Not on file.   Social History Main Topics  .  Smoking status: Never Smoker   . Smokeless tobacco: Not on file  . Alcohol Use: No  . Drug Use: No  . Sexual Activity: Not Currently   Other Topics Concern  . Not on file   Social History Narrative   He has never smoked or drank. No illcit drug use . He is retired from the [employer] and lives with his wife. Illicit Drug Use- no     Review of Systems General:  No chills, fever, night sweats  Cardiovascular:  No chest pain Dermatological: No rash, lesions/masses Respiratory: + cough and dyspnea Urologic: No hematuria, dysuria Abdominal:   No nausea, vomiting, diarrhea, bright red blood per rectum, melena, or hematemesis Neurologic:  No visual changes, wkns, changes in mental status. All other systems reviewed and are otherwise negative except as noted above.  Physical Exam  Blood pressure 123/75, pulse 82, temperature 98.5 F (36.9 C), temperature source Temporal, resp. rate 25, SpO2 93.00%.  General: Pleasant, mild to moderate resp distress Psych: Normal affect. Neuro: Alert and oriented X 3. Moves all extremities spontaneously. HEENT: Normal  Neck: JVP to mandible while seated Lungs:  Faint bibasilar crackles Heart: Irregular. 3/6 holosystolic murmur loudest at the base Abdomen: Soft, non-tender, non-distended, BS + x 4.  Extremities: No clubbing, cyanosis. DP/PT/Radials 2+ and equal bilaterally. 2+ LE edema  Labs  Troponin (Point of Care Test) No results found for this basename: TROPIPOC,  in the last 72 hours  Recent Labs  07/12/13 0155  TROPONINI <0.30   Lab Results  Component Value Date   WBC 9.1 07/12/2013   HGB 12.4* 07/12/2013   HCT 35.8* 07/12/2013   MCV 92.3 07/12/2013   PLT 282 07/12/2013    Recent Labs Lab 07/12/13 0155  NA 134*  K 4.3  CL 98  CO2 19  BUN 32*  CREATININE 1.44*  CALCIUM 8.8  GLUCOSE 112*   Lab Results  Component Value Date   CHOL 166 09/17/2010   HDL 37.40* 09/17/2010   LDLCALC  Value: 113        Total Cholesterol/HDL:CHD  Risk Coronary Heart Disease Risk Table                     Men   Women  1/2 Average Risk   3.4   3.3  Average Risk       5.0   4.4  2 X Average Risk   9.6   7.1  3 X Average Risk  23.4   11.0        Use the  calculated Patient Ratio above and the CHD Risk Table to determine the patient's CHD Risk.        ATP III CLASSIFICATION (LDL):  <100     mg/dL   Optimal  100-129  mg/dL   Near or Above                    Optimal  130-159  mg/dL   Borderline  160-189  mg/dL   High  >190     mg/dL   Very High* 08/07/2008   TRIG 224.0* 09/17/2010   Lab Results  Component Value Date   DDIMER 0.90* 12/20/2011     Radiology/Studies  Dg Chest 2 View  07/12/2013   CLINICAL DATA:  Shortness of breath.  EXAM: CHEST  2 VIEW  COMPARISON:  07/03/2013  FINDINGS: The cardiac silhouette is likely within normal limits for size for AP technique. Small bilateral pleural effusions do not appear significantly changed. Interstitial markings are again prominent bilaterally. Bibasilar opacities appear mildly increased compared to the prior study. No pneumothorax is identified. No acute osseous abnormality is seen.  IMPRESSION: Persistent, small bilateral pleural effusions with mildly increased basilar predominant lung infiltrates versus edema.   Electronically Signed   By: Logan Bores   On: 07/12/2013 03:13   Dg Chest 2 View  07/03/2013   CLINICAL DATA:  sob  EXAM: CHEST  2 VIEW  COMPARISON:  DG CHEST 2 VIEW dated 09/28/2012  FINDINGS: The cardiac silhouette is enlarged. There is blunting of the costophrenic angles right greater the left. Areas of increased density projects in the lung bases and right middle lobe and to a lesser extent lingula region. There is otherwise diffuse prominence of interstitial markings. Dextroscoliosis appreciated within the thoracolumbar spine. Chronic changes are identified along the distal right clavicle.  IMPRESSION: Bibasilar infiltrates versus atelectasis as well as small pleural effusions right greater than  left. Underlying component of chronic interstitial fibrotic change is also diagnostic consideration.   Electronically Signed   By: Margaree Mackintosh M.D.   On: 07/03/2013 16:25   TTE 07/04/13 - Left ventricle: Systolic function was severely reduced. The estimated ejection fraction was in the range of 25% to 30%. When compared to 2010, EF is reduced (45% prior). There is severe hypokinesis of the anteroseptal myocardium. Doppler parameters are consistent with high ventricular filling pressure. - Aortic valve: Mild regurgitation. - Aorta: Aortic root dimension: 47mm (ED). - Mitral valve: Moderate regurgitation. - Left atrium: The atrium was mildly dilated. - Right atrium: The atrium was mildly dilated. - Atrial septum: No defect or patent foramen ovale was identified. - Tricuspid valve: Moderate regurgitation. - Pulmonary arteries: PA peak pressure: 58mm Hg (S). - Pericardium, extracardiac: There was a left pleural effusion.  ECG AF. LBBBoid IVCD  ASSESSMENT AND PLAN 42M with CAD, alternating BBB, mod MR, HFrEF (25-30%) , COPD who is admitted with ADHF despite outpatient diuretic augmentation. He has a recent drop in his EF. Possible etiologies include: ischemic heart disease, PVC induced given the burden of ventricular beats, and long standing LBBB induced dyssynchrony. He has no angina.   Hard to determine what is the rhythm on the 3/18 ECG. The positive-negative-positive precordial transition is concerning for a wide ventricular escape in the setting of AV block and AF. Not sure what to make of reports of tachycardia to 150s - could be VT, AF, or mis measurement given it was documented on home equipment.   - 80mg  IV lasix BID - Hold metoprolol  given bradycardia - EP consult for PPM - ? CRT-P given low EF per Block HF criteria - Would add ACE and BB later in hospitalization - TSH - Tele monitoring  FULL CODE per discussion with patient and son. They will think about DNR - our  discussion was the 1st they have had about this topic.   Signed, Lamar Sprinkles, MD 07/12/2013, 3:35 AM

## 2013-07-13 DIAGNOSIS — I495 Sick sinus syndrome: Secondary | ICD-10-CM

## 2013-07-13 DIAGNOSIS — I359 Nonrheumatic aortic valve disorder, unspecified: Secondary | ICD-10-CM

## 2013-07-13 DIAGNOSIS — I5023 Acute on chronic systolic (congestive) heart failure: Principal | ICD-10-CM

## 2013-07-13 DIAGNOSIS — I4891 Unspecified atrial fibrillation: Secondary | ICD-10-CM

## 2013-07-13 DIAGNOSIS — I452 Bifascicular block: Secondary | ICD-10-CM | POA: Diagnosis present

## 2013-07-13 DIAGNOSIS — I509 Heart failure, unspecified: Secondary | ICD-10-CM

## 2013-07-13 LAB — BASIC METABOLIC PANEL
BUN: 39 mg/dL — ABNORMAL HIGH (ref 6–23)
BUN: 39 mg/dL — ABNORMAL HIGH (ref 6–23)
CALCIUM: 8.6 mg/dL (ref 8.4–10.5)
CHLORIDE: 98 meq/L (ref 96–112)
CO2: 24 meq/L (ref 19–32)
CO2: 26 mEq/L (ref 19–32)
CREATININE: 1.74 mg/dL — AB (ref 0.50–1.35)
CREATININE: 1.77 mg/dL — AB (ref 0.50–1.35)
Calcium: 8.9 mg/dL (ref 8.4–10.5)
Chloride: 96 mEq/L (ref 96–112)
GFR calc Af Amer: 37 mL/min — ABNORMAL LOW (ref >90)
GFR calc non Af Amer: 31 mL/min — ABNORMAL LOW (ref >90)
GFR, EST AFRICAN AMERICAN: 36 mL/min — AB (ref >90)
GFR, EST NON AFRICAN AMERICAN: 32 mL/min — AB (ref >90)
Glucose, Bld: 138 mg/dL — ABNORMAL HIGH (ref 70–99)
Glucose, Bld: 114 mg/dL — ABNORMAL HIGH (ref 70–99)
Potassium: 4.2 mEq/L (ref 3.7–5.3)
Potassium: 4.5 mEq/L (ref 3.7–5.3)
Sodium: 136 mEq/L — ABNORMAL LOW (ref 137–147)
Sodium: 140 mEq/L (ref 137–147)

## 2013-07-13 LAB — MAGNESIUM: MAGNESIUM: 2.3 mg/dL (ref 1.5–2.5)

## 2013-07-13 MED ORDER — MAGNESIUM HYDROXIDE 400 MG/5ML PO SUSP
30.0000 mL | Freq: Every day | ORAL | Status: DC
  Administered 2013-07-13 – 2013-07-16 (×4): 30 mL via ORAL
  Filled 2013-07-13 (×5): qty 30

## 2013-07-13 MED ORDER — DOCUSATE SODIUM 50 MG/5ML PO LIQD
50.0000 mg | Freq: Every day | ORAL | Status: DC
  Administered 2013-07-13 – 2013-07-16 (×4): 50 mg via ORAL
  Filled 2013-07-13 (×4): qty 10

## 2013-07-13 MED ORDER — MAGNESIUM HYDROXIDE NICU ORAL SYRINGE 400 MG/5 ML: 30.0000 mL | Freq: Every day | ORAL | Status: DC

## 2013-07-13 NOTE — Evaluation (Signed)
Physical Therapy Evaluation Patient Details Name: Levi Lewis MRN: [id number] DOB: 1918/03/15 Today's Date: 07/13/2013   History of Present Illness  Adm 3/27 with dyspnea and tachycardia. PMHx-lower EF, back surgery, right shoulder and knee surgery, arrythmias (tachy-brady)  Clinical Impression  Patient evaluated by Physical Therapy with no further acute PT needs identified.  PT is signing off. Thank you for this referral.     Follow Up Recommendations No PT follow up    Equipment Recommendations  None recommended by PT    Recommendations for Other Services       Precautions / Restrictions        Mobility  Bed Mobility Overal bed mobility: Modified Independent                Transfers Overall transfer level: Independent Equipment used: None                Ambulation/Gait Ambulation/Gait assistance: Supervision Ambulation Distance (Feet): 200 Feet Assistive device: None Gait Pattern/deviations: WFL(Within Functional Limits) Gait velocity: decr from his normal due to dyspnea Gait velocity interpretation: Below normal speed for age/gender    Stairs            Wheelchair Mobility    Modified Rankin (Stroke Patients Only)       Balance Overall balance assessment: Independent   Sitting balance-Leahy Scale: Normal     Standing balance support: No upper extremity supported Standing balance-Leahy Scale: Normal       Tandem Stance - Right Leg: 30   [place] - Eyes Opened: 30 [place] - Eyes Closed: 30         Pertinent Vitals/Pain SaO2 90% on 2L at rest           93% on 3L with ambulation HR 88-104 with activity    Home Living Family/patient expects to be discharged to:: Private residence Living Arrangements: Spouse/significant other Available Help at Discharge: Family;Available 24 hours/day Type of Home: House         Home Equipment: Kasandra Knudsen - single point      Prior Function Level of Independence: Independent          Comments: only rarely uses cane (if knee bothers him)     Hand Dominance        Extremity/Trunk Assessment   Upper Extremity Assessment: Overall WFL for tasks assessed           Lower Extremity Assessment: Overall WFL for tasks assessed      Cervical / Trunk Assessment: Kyphotic (slightly)  Communication   Communication: HOH  Cognition Arousal/Alertness: Awake/alert Behavior During Therapy: WFL for tasks assessed/performed Overall Cognitive Status: Within Functional Limits for tasks assessed                      General Comments      Exercises        Assessment/Plan    PT Assessment Patent does not need any further PT services  PT Diagnosis     PT Problem List    PT Treatment Interventions     PT Goals (Current goals can be found in the Care Plan section) Acute Rehab PT Goals PT Goal Formulation: No goals set, d/c therapy    Frequency     Barriers to discharge        End of Session Equipment Utilized During Treatment: Gait belt;Oxygen Activity Tolerance: Patient limited by fatigue Patient left: in bed;with call bell/phone within reach;with family/visitor present  Time: 5397-6734 PT Time Calculation (min): 41 min   Charges:   PT Evaluation $Initial PT Evaluation Tier I: 1 Procedure PT Treatments $Gait Training: 23-37 mins   PT G Codes:          Lennix Kneisel 2013/07/24, 3:47 PM Pager [phone]

## 2013-07-13 NOTE — Consult Note (Signed)
 ELECTROPHYSIOLOGY CONSULT NOTE    Patient ID: Levi Lewis MRN: [id number], DOB/AGE: 12/19/1917 80 y.o.  Admit date: 07/12/2013 Date of Consult: 07-13-13  Primary Physician: GREEN, EDWIN JAY, MD Primary Cardiologist: Katz  Reason for Consultation: atrial fibrillation, conduction system disease  HPI:  Levi Lewis is a 80 y.o. male with a past medical history significant for CAD (calcified coronaries by CT scan in the past), COPD, LBBB, and palpitations.  He has been closely followed by Dr Katz.  Pt has a h/o cardiomyopathy with previous EF of 45% in 2010, with more pronounced LV dysfxn by echo earlier this month - 25-30% (mild AI, mod MR/TR). He has been experiencing palpitations and worsening dyspnea with volume overload recently. He was seen by Dr. Katz on 3/18 with titration of lasix and then again on 3/25 at which point LV dysfxn was noted. Lasix dose was maintained at the visit on the 25th and arrangements were made for a 48 hr holter monitor in the setting of tachypalps. EP f/u was also arranged. He was placed on imdur, which gave him a headache and his dyspnea has subsequently worsened. He presented to the ED for further evaluation.  He was found to be in acute on chronic systolic heart failure and he has diuresed 3L with improvement in symptoms today.   EKG on arrival was questionable for atrial fibrillation.  He also has a long standing hx of LBBB and there was concern for alternating bundles. RBBB noted on ECG 6/14  EP has been asked to evaluate for treatment options.  He endorses shortness of breath, LE edema, palpitations.  He denies chest pain, dizziness, pre-syncope, or syncope.  He does state that occasionally he feels like his heart slows down.  His palpitations are mostly with exertion and are terminated with rest.   Lab work this admission is notable for TSH of 6.408 (no prev known thyroid dysfunction), creat 1.77 (baseline 1.4), negative cardiac enzymes.   Echo 07-04-13  demonstrated EF 25-30%, severe hypokinesis of the anteroseptal myocardium, mild AR,moderate MR, moderate TR, LA 44.    ROS is negative except as outlined above.   Past Medical History  Diagnosis Date  . Left bundle branch block   . CAD (coronary artery disease)   . Hypertension   . Gout   . Dyslipidemia   . Palpitations   . Systolic murmur   . Ejection fraction   . Lung nodules   . COPD (chronic obstructive pulmonary disease)   . Shortness of breath   . Mitral regurgitation   . Aortic insufficiency   . Shoulder pain   . Back pain     Back and knee pain, June, 2014     Surgical History:  Past Surgical History  Procedure Laterality Date  . Back surgery    . Right shoulder    . Right knee surgery    . Tonsillectomy    . Coronary angioplasty       Prescriptions prior to admission  Medication Sig Dispense Refill  . aspirin 81 MG tablet Take 81 mg by mouth daily.        . fenofibrate (TRICOR) 48 MG tablet Take 0.5 tablets (24 mg total) by mouth daily.  45 tablet  3  . fish oil-omega-3 fatty acids 1000 MG capsule Take 2 g by mouth daily.        . furosemide (LASIX) 40 MG tablet Take 1 tablet (40 mg total) by mouth 2 (two) times daily.    180 tablet  1  . isosorbide mononitrate (IMDUR) 30 MG 24 hr tablet Take 1 tablet (30 mg total) by mouth daily.  30 tablet  3  . metoprolol succinate (TOPROL-XL) 50 MG 24 hr tablet Take 1 tablet (50 mg total) by mouth daily. Take with or immediately following a meal.  90 tablet  3  . Multiple Vitamins-Minerals (OCUVITE PO) Take 2 tablets by mouth daily.       Marland Kitchen omeprazole (PRILOSEC) 20 MG capsule Take 20 mg by mouth daily.        . tamsulosin (FLOMAX) 0.4 MG CAPS Take 1 capsule (0.4 mg total) by mouth daily. 1 tab po qd  90 capsule  3  . triazolam (HALCION) 0.25 MG tablet Take 0.25 mg by mouth at bedtime as needed for sleep.         Inpatient Medications:  . antiseptic oral rinse  15 mL Mouth Rinse q12n4p  . aspirin  81 mg Oral Daily  . beta  carotene w/minerals  1 tablet Oral Daily  . chlorhexidine  15 mL Mouth Rinse BID  . fenofibrate  54 mg Oral Daily  . fish oil-omega-3 fatty acids  2 g Oral Daily  . furosemide  80 mg Intravenous BID  . heparin  5,000 Units Subcutaneous 3 times per day  . pantoprazole  40 mg Oral Daily  . sodium chloride  3 mL Intravenous Q12H  . tamsulosin  0.4 mg Oral Daily    Allergies: No Known Allergies  History   Social History  . Marital Status: Married    Spouse Name: N/A    Number of Children: N/A  . Years of Education: N/A   Occupational History  . Not on file.   Social History Main Topics  . Smoking status: Never Smoker   . Smokeless tobacco: Not on file  . Alcohol Use: No  . Drug Use: No  . Sexual Activity: Not Currently   Other Topics Concern  . Not on file   Social History Narrative   He has never smoked or drank. No illcit drug use . He is retired from the [employer] and lives with his wife. Illicit Drug Use- no     Family History  Problem Relation Age of Onset  . Heart attack Sister   . Lung cancer Brother     smoker  . Colon cancer Neg Hx     BP 116/59  Pulse 71  Temp(Src) 97.8 F (36.6 C) (Oral)  Resp 20  Ht 5\' 11"  (1.803 m)  Wt 178 lb 3.2 oz (80.831 kg)  BMI 24.86 kg/m2  SpO2 94% Well developed and nourished in no acute distress HENT normal Neck supple with JVP-flat Carotids brisk and full without bruits Clear Back without kyphosis pectas excavatum Irregularly irregular rate and rhythm with controlled  ventricular response, no murmurs or gallops Abd-soft with active BS without hepatomegaly No Clubbing cyanosis edema Skin-warm and dry A & Oriented  Grossly normal sensory and motor function   Labs:   Lab Results  Component Value Date   WBC 8.2 07/12/2013   HGB 12.9* 07/12/2013   HCT 37.0* 07/12/2013   MCV 93.2 07/12/2013   PLT 295 07/12/2013     Recent Labs Lab 07/13/13 0435  NA 140  K 4.5  CL 98  CO2 26  BUN 39*  CREATININE 1.77*    CALCIUM 8.9  GLUCOSE 114*    Radiology/Studies: Dg Chest 2 View 07/12/2013   CLINICAL DATA:  Shortness of  breath.  EXAM: CHEST  2 VIEW  COMPARISON:  07/03/2013  FINDINGS: The cardiac silhouette is likely within normal limits for size for AP technique. Small bilateral pleural effusions do not appear significantly changed. Interstitial markings are again prominent bilaterally. Bibasilar opacities appear mildly increased compared to the prior study. No pneumothorax is identified. No acute osseous abnormality is seen.  IMPRESSION: Persistent, small bilateral pleural effusions with mildly increased basilar predominant lung infiltrates versus edema.   Electronically Signed   By: Allen  Grady   On: 07/12/2013 03:13   Dg Chest 2 View 07/03/2013   CLINICAL DATA:  sob  EXAM: CHEST  2 VIEW  COMPARISON:  DG CHEST 2 VIEW dated 09/28/2012  FINDINGS: The cardiac silhouette is enlarged. There is blunting of the costophrenic angles right greater the left. Areas of increased density projects in the lung bases and right middle lobe and to a lesser extent lingula region. There is otherwise diffuse prominence of interstitial markings. Dextroscoliosis appreciated within the thoracolumbar spine. Chronic changes are identified along the distal right clavicle.  IMPRESSION: Bibasilar infiltrates versus atelectasis as well as small pleural effusions right greater than left. Underlying component of chronic interstitial fibrotic change is also diagnostic consideration.   Electronically Signed   By: Hector  Cooper M.D.   On: 07/03/2013 16:25   EKG:atrial fibrillation, ventricular rate 86, QRS 160, LBBB  TELEMETRY: atrial fibrillation, ventricular rates up to 140 w minimal exertion  Imp Recs Principal Problem:   Acute on chronic systolic CHF (congestive heart failure) Active Problems:   Atrial fibrillation   Alternating (bilateral) bundle branch block   Left bundle branch block   Pt meets class 1 indication for pacing given  alternating bundles.  He also has tachy brady and will need pacing to protect from brady assoc with rate control strategies His progressive LV dysfunction begs the question as to why, and rate relatedness in one possibility;  He also has known CA calcifications and wall motion abnormalities which raise concern re CAD althugh myoview 2010 demonstrated wall motion abnormalities without significant perfusion defects He will need CRT   I have also broached the subject is this very healthy 80 yo man  Of IICD  We have also discussed atrial fib and its management, with tachy brady and need for back up brady pacing and ultimately cardioversion which we will delay until post pacing  The benefits and risks were reviewed including but not limited to death,  perforation, infection, lead dislodgement and device malfunction.  The patient understands agrees and is willing to proceed.       

## 2013-07-13 NOTE — Progress Notes (Signed)
DAILY PROGRESS NOTE  Subjective:  Leg cramping noted overnight.  Diuresed about 3L net negative. Creatinine is up to 1.7 but stable. Potassium and magnesium are replete.  BNP yesterday was 5240.  Seen by EP this morning and thought to be appropriate for Bi-V pacemaker for a-fib and alternating LBBB/RBBB.   Objective:  Temp:  [97.6 F (36.4 C)-97.9 F (36.6 C)] 97.6 F (36.4 C) (03/28 1109) Pulse Rate:  [69-80] 80 (03/28 1109) Resp:  [20-26] 20 (03/28 1109) BP: (104-123)/(59-96) 113/64 mmHg (03/28 1109) SpO2:  [92 %-96 %] 96 % (03/28 1109) Weight:  [178 lb 3.2 oz (80.831 kg)-182 lb (82.555 kg)] 178 lb 3.2 oz (80.831 kg) (03/28 0544) Weight change:   Intake/Output from previous day: 03/27 0701 - 03/28 0700 In: 1010 [P.O.:1010] Out: 2860 [Urine:2860]  Intake/Output from this shift: Total I/O In: 200 [P.O.:200] Out: 225 [Urine:225]  Medications: Current Facility-Administered Medications  Medication Dose Route Frequency Provider Last Rate Last Dose  . 0.9 %  sodium chloride infusion  250 mL Intravenous PRN Lamar Sprinkles, MD      . antiseptic oral rinse (BIOTENE) solution 15 mL  15 mL Mouth Rinse q12n4p Sherren Mocha, MD      . aspirin chewable tablet 81 mg  81 mg Oral Daily Carlena Bjornstad, MD   81 mg at 07/12/13 1321  . beta carotene w/minerals (OCUVITE) tablet 1 tablet  1 tablet Oral Daily Lamar Sprinkles, MD   1 tablet at 07/13/13 1143  . chlorhexidine (PERIDEX) 0.12 % solution 15 mL  15 mL Mouth Rinse BID Sherren Mocha, MD   15 mL at 07/13/13 0920  . docusate (COLACE) 50 MG/5ML liquid 50 mg  50 mg Oral Daily Deboraha Sprang, MD      . fenofibrate tablet 54 mg  54 mg Oral Daily Lamar Sprinkles, MD   54 mg at 07/13/13 1143  . fish oil-omega-3 fatty acids capsule 2 g  2 g Oral Daily Lamar Sprinkles, MD   2 g at 07/12/13 1322  . furosemide (LASIX) injection 80 mg  80 mg Intravenous BID Lamar Sprinkles, MD   80 mg at 07/13/13 6623  . heparin injection 5,000 Units  5,000  Units Subcutaneous 3 times per day Deboraha Sprang, MD   5,000 Units at 07/13/13 [phone]  . magnesium hydroxide (MILK OF MAGNESIA) suspension 30 mL  30 mL Oral Daily Deboraha Sprang, MD      . pantoprazole (PROTONIX) EC tablet 40 mg  40 mg Oral Daily Lamar Sprinkles, MD   40 mg at 07/12/13 1015  . sodium chloride (OCEAN) 0.65 % nasal spray 1 spray  1 spray Each Nare PRN Cletus Gash, MD   1 spray at 07/13/13 0150  . sodium chloride 0.9 % injection 3 mL  3 mL Intravenous Q12H Lamar Sprinkles, MD   3 mL at 07/13/13 1145  . sodium chloride 0.9 % injection 3 mL  3 mL Intravenous PRN Lamar Sprinkles, MD      . tamsulosin [facility]) capsule 0.4 mg  0.4 mg Oral Daily Lamar Sprinkles, MD   0.4 mg at 07/13/13 1143  . triazolam (HALCION) tablet 0.25 mg  0.25 mg Oral QHS PRN Lamar Sprinkles, MD        Physical Exam: General appearance: alert and no distress Neck: no carotid bruit and no JVD Lungs: diminished breath sounds bilaterally Abdomen: soft, non-tender; bowel sounds normal; no masses,  no organomegaly Extremities: edema trace pedal  Lab Results: Results for orders  placed during the hospital encounter of 07/12/13 (from the past 48 hour(s))  CBC WITH DIFFERENTIAL     Status: Abnormal   Collection Time    07/12/13  1:55 AM      Result Value Ref Range   WBC 9.1  4.0 - 10.5 K/uL   RBC 3.88 (*) 4.22 - 5.81 MIL/uL   Hemoglobin 12.4 (*) 13.0 - 17.0 g/dL   HCT 35.8 (*) 39.0 - 52.0 %   MCV 92.3  78.0 - 100.0 fL   MCH 32.0  26.0 - 34.0 pg   MCHC 34.6  30.0 - 36.0 g/dL   RDW 13.9  11.5 - 15.5 %   Platelets 282  150 - 400 K/uL   Neutrophils Relative % 61  43 - 77 %   Neutro Abs 5.6  1.7 - 7.7 K/uL   Lymphocytes Relative 26  12 - 46 %   Lymphs Abs 2.3  0.7 - 4.0 K/uL   Monocytes Relative 11  3 - 12 %   Monocytes Absolute 1.0  0.1 - 1.0 K/uL   Eosinophils Relative 2  0 - 5 %   Eosinophils Absolute 0.2  0.0 - 0.7 K/uL   Basophils Relative 0  0 - 1 %   Basophils Absolute 0.0  0.0 - 0.1 K/uL  BASIC  METABOLIC PANEL     Status: Abnormal   Collection Time    07/12/13  1:55 AM      Result Value Ref Range   Sodium 134 (*) 137 - 147 mEq/L   Potassium 4.3  3.7 - 5.3 mEq/L   Chloride 98  96 - 112 mEq/L   CO2 19  19 - 32 mEq/L   Glucose, Bld 112 (*) 70 - 99 mg/dL   BUN 32 (*) 6 - 23 mg/dL   Creatinine, Ser 1.44 (*) 0.50 - 1.35 mg/dL   Calcium 8.8  8.4 - 10.5 mg/dL   GFR calc non Af Amer 40 (*) >90 mL/min   GFR calc Af Amer 46 (*) >90 mL/min   Comment: (NOTE)     The eGFR has been calculated using the CKD EPI equation.     This calculation has not been validated in all clinical situations.     eGFR's persistently <90 mL/min signify possible Chronic Kidney     Disease.  TROPONIN I     Status: None   Collection Time    07/12/13  1:55 AM      Result Value Ref Range   Troponin I <0.30  <0.30 ng/mL   Comment:            Due to the release kinetics of cTnI,     a negative result within the first hours     of the onset of symptoms does not rule out     myocardial infarction with certainty.     If myocardial infarction is still suspected,     repeat the test at appropriate intervals.  PRO B NATRIURETIC PEPTIDE     Status: Abnormal   Collection Time    07/12/13  1:55 AM      Result Value Ref Range   Pro B Natriuretic peptide (BNP) 5240.0 (*) 0 - 450 pg/mL  TSH     Status: Abnormal   Collection Time    07/12/13  7:27 AM      Result Value Ref Range   TSH 6.408 (*) 0.350 - 4.500 uIU/mL   Comment: Performed at [facility]  Status: Abnormal   Collection Time    07/12/13 11:51 AM      Result Value Ref Range   WBC 8.2  4.0 - 10.5 K/uL   RBC 3.97 (*) 4.22 - 5.81 MIL/uL   Hemoglobin 12.9 (*) 13.0 - 17.0 g/dL   HCT 37.0 (*) 39.0 - 52.0 %   MCV 93.2  78.0 - 100.0 fL   MCH 32.5  26.0 - 34.0 pg   MCHC 34.9  30.0 - 36.0 g/dL   RDW 13.9  11.5 - 15.5 %   Platelets 295  150 - 400 K/uL  CREATININE, SERUM     Status: Abnormal   Collection Time    07/12/13 11:51 AM       Result Value Ref Range   Creatinine, Ser 1.44 (*) 0.50 - 1.35 mg/dL   GFR calc non Af Amer 40 (*) >90 mL/min   GFR calc Af Amer 46 (*) >90 mL/min   Comment: (NOTE)     The eGFR has been calculated using the CKD EPI equation.     This calculation has not been validated in all clinical situations.     eGFR's persistently <90 mL/min signify possible Chronic Kidney     Disease.  BASIC METABOLIC PANEL     Status: Abnormal   Collection Time    07/12/13 11:21 PM      Result Value Ref Range   Sodium 136 (*) 137 - 147 mEq/L   Potassium 4.2  3.7 - 5.3 mEq/L   Chloride 96  96 - 112 mEq/L   CO2 24  19 - 32 mEq/L   Glucose, Bld 138 (*) 70 - 99 mg/dL   BUN 39 (*) 6 - 23 mg/dL   Creatinine, Ser 1.74 (*) 0.50 - 1.35 mg/dL   Calcium 8.6  8.4 - 10.5 mg/dL   GFR calc non Af Amer 32 (*) >90 mL/min   GFR calc Af Amer 37 (*) >90 mL/min   Comment: (NOTE)     The eGFR has been calculated using the CKD EPI equation.     This calculation has not been validated in all clinical situations.     eGFR's persistently <90 mL/min signify possible Chronic Kidney     Disease.  MAGNESIUM     Status: None   Collection Time    07/12/13 11:21 PM      Result Value Ref Range   Magnesium 2.3  1.5 - 2.5 mg/dL  BASIC METABOLIC PANEL     Status: Abnormal   Collection Time    07/13/13  4:35 AM      Result Value Ref Range   Sodium 140  137 - 147 mEq/L   Potassium 4.5  3.7 - 5.3 mEq/L   Chloride 98  96 - 112 mEq/L   CO2 26  19 - 32 mEq/L   Glucose, Bld 114 (*) 70 - 99 mg/dL   BUN 39 (*) 6 - 23 mg/dL   Creatinine, Ser 1.77 (*) 0.50 - 1.35 mg/dL   Calcium 8.9  8.4 - 10.5 mg/dL   GFR calc non Af Amer 31 (*) >90 mL/min   GFR calc Af Amer 36 (*) >90 mL/min   Comment: (NOTE)     The eGFR has been calculated using the CKD EPI equation.     This calculation has not been validated in all clinical situations.     eGFR's persistently <90 mL/min signify possible Chronic Kidney     Disease.    Imaging: Dg Chest  2  View  07/12/2013   CLINICAL DATA:  Shortness of breath.  EXAM: CHEST  2 VIEW  COMPARISON:  07/03/2013  FINDINGS: The cardiac silhouette is likely within normal limits for size for AP technique. Small bilateral pleural effusions do not appear significantly changed. Interstitial markings are again prominent bilaterally. Bibasilar opacities appear mildly increased compared to the prior study. No pneumothorax is identified. No acute osseous abnormality is seen.  IMPRESSION: Persistent, small bilateral pleural effusions with mildly increased basilar predominant lung infiltrates versus edema.   Electronically Signed   By: Logan Bores   On: 07/12/2013 03:13    Assessment:  1. Principal Problem: 2.   Acute on chronic systolic CHF (congestive heart failure) 3. Active Problems: 4.   Left bundle branch block 5.   CAD (coronary artery disease) 6.   Hypertension 7.   Gout 8.   Dyslipidemia 9.   COPD (chronic obstructive pulmonary disease) 10.   Mitral regurgitation 11.   Aortic insufficiency 12.   Atrial fibrillation 13.   Alternating (bilateral) bundle branch block 14.   Plan:  1. Continue IV diuresis today. Creatinine is up but stable. Plan for BI-V Pacer, most likely, on Monday per Dr. Caryl Comes.   Time Spent Directly with Patient:  15 minutes  Length of Stay:  LOS: 1 day   Pixie Casino, MD, [facility] Attending Cardiologist [facility]  [place]ah Valcarcel C 07/13/2013, 12:05 PM

## 2013-07-14 ENCOUNTER — Encounter (HOSPITAL_COMMUNITY): Payer: Self-pay | Admitting: Cardiology

## 2013-07-14 DIAGNOSIS — N184 Chronic kidney disease, stage 4 (severe): Secondary | ICD-10-CM

## 2013-07-14 DIAGNOSIS — I428 Other cardiomyopathies: Secondary | ICD-10-CM

## 2013-07-14 DIAGNOSIS — I429 Cardiomyopathy, unspecified: Secondary | ICD-10-CM | POA: Diagnosis present

## 2013-07-14 HISTORY — DX: Cardiomyopathy, unspecified: I42.9

## 2013-07-14 LAB — BASIC METABOLIC PANEL
BUN: 38 mg/dL — ABNORMAL HIGH (ref 6–23)
CO2: 28 mEq/L (ref 19–32)
Calcium: 8.9 mg/dL (ref 8.4–10.5)
Chloride: 99 mEq/L (ref 96–112)
Creatinine, Ser: 1.89 mg/dL — ABNORMAL HIGH (ref 0.50–1.35)
GFR calc Af Amer: 33 mL/min — ABNORMAL LOW (ref >90)
GFR, EST NON AFRICAN AMERICAN: 29 mL/min — AB (ref >90)
Glucose, Bld: 116 mg/dL — ABNORMAL HIGH (ref 70–99)
POTASSIUM: 4.4 meq/L (ref 3.7–5.3)
SODIUM: 140 meq/L (ref 137–147)

## 2013-07-14 MED ORDER — SODIUM CHLORIDE 0.9 % IV SOLN
INTRAVENOUS | Status: DC
  Administered 2013-07-15: 06:00:00 via INTRAVENOUS

## 2013-07-14 MED ORDER — SODIUM CHLORIDE 0.9 % IR SOLN
80.0000 mg | Status: DC
  Filled 2013-07-14 (×4): qty 2

## 2013-07-14 MED ORDER — SODIUM CHLORIDE 0.9 % IV SOLN: INTRAVENOUS | Status: DC

## 2013-07-14 MED ORDER — CEFAZOLIN SODIUM-DEXTROSE 2-3 GM-% IV SOLR
2.0000 g | INTRAVENOUS | Status: DC
  Filled 2013-07-14: qty 50

## 2013-07-14 MED ORDER — CHLORHEXIDINE GLUCONATE 4 % EX LIQD
60.0000 mL | Freq: Once | CUTANEOUS | Status: AC
  Administered 2013-07-14: 4 via TOPICAL
  Filled 2013-07-14: qty 60

## 2013-07-14 MED ORDER — CHLORHEXIDINE GLUCONATE 4 % EX LIQD
60.0000 mL | Freq: Once | CUTANEOUS | Status: AC
  Administered 2013-07-15: 4 via TOPICAL
  Filled 2013-07-14 (×3): qty 60

## 2013-07-14 MED ORDER — FUROSEMIDE 40 MG PO TABS
40.0000 mg | ORAL_TABLET | Freq: Two times a day (BID) | ORAL | Status: DC
  Administered 2013-07-14 – 2013-07-16 (×4): 40 mg via ORAL
  Filled 2013-07-14 (×6): qty 1

## 2013-07-14 NOTE — Progress Notes (Signed)
Subjective: Breathing improved, no chest pain  Objective: Vital signs in last 24 hours: Temp:  [97.1 F (36.2 C)-98.1 F (36.7 C)] 97.1 F (36.2 C) (03/29 0552) Pulse Rate:  [71-80] 80 (03/29 0552) Resp:  [18-20] 20 (03/29 0552) BP: (108-130)/(59-82) 116/59 mmHg (03/29 0552) SpO2:  [94 %-97 %] 96 % (03/29 0552) Weight:  [173 lb 15.1 oz (78.9 kg)] 173 lb 15.1 oz (78.9 kg) (03/29 0552) Weight change: -8 lb 0.9 oz (-3.655 kg) Last BM Date: 07/12/13 Intake/Output from previous day:-260 (-3160 since admit) wt 173 down from 182 on admit 03/28 0701 - 03/29 0700 In: 840 [P.O.:840] Out: 1100 [Urine:1100] Intake/Output this shift:    PE: General:Pleasant affect, NAD Skin:Warm and dry, brisk capillary refill HEENT:normocephalic, sclera clear, mucus membranes moist Neck:supple, no JVD sitting up right  Heart:irreg irreg without murmur, gallup, rub or click Lungs:clear without rales, rhonchi, or wheezes NLZ:JQBH, non tender, + BS, do not palpate liver spleen or masses Ext:no lower ext edema Neuro:alert and oriented, MAE, follows commands, + facial symmetry  tele: some brady to 35, some tachy to 130, + PVCs Lab Results:  Recent Labs  07/12/13 0155 07/12/13 1151  WBC 9.1 8.2  HGB 12.4* 12.9*  HCT 35.8* 37.0*  PLT 282 295   BMET  Recent Labs  07/13/13 0435 07/14/13 0415  NA 140 140  K 4.5 4.4  CL 98 99  CO2 26 28  GLUCOSE 114* 116*  BUN 39* 38*  CREATININE 1.77* 1.89*  CALCIUM 8.9 8.9    Recent Labs  07/12/13 0155  TROPONINI <0.30     No results found for this basename: HGBA1C     Lab Results  Component Value Date   TSH 6.408* 07/12/2013      Studies/Results: No results found.  Medications: I have reviewed the patient's current medications. Scheduled Meds: . antiseptic oral rinse  15 mL Mouth Rinse q12n4p  . aspirin  81 mg Oral Daily  . beta carotene w/minerals  1 tablet Oral Daily  . chlorhexidine  15 mL Mouth Rinse BID  . docusate   50 mg Oral Daily  . fenofibrate  54 mg Oral Daily  . fish oil-omega-3 fatty acids  2 g Oral Daily  . furosemide  80 mg Intravenous BID  . heparin  5,000 Units Subcutaneous 3 times per day  . magnesium hydroxide  30 mL Oral Daily  . pantoprazole  40 mg Oral Daily  . sodium chloride  3 mL Intravenous Q12H  . tamsulosin  0.4 mg Oral Daily   Continuous Infusions:  PRN Meds:.sodium chloride, sodium chloride, sodium chloride, triazolam  Assessment/Plan: Principal Problem:   Acute on chronic systolic CHF (congestive heart failure)--IV diuresis, Pro bnp 5240 EF 25-30% Active Problems:   Left bundle branch block   CAD (coronary artery disease)--nuc in 2010 neg for ischemia, trop. Neg this admit   Hypertension   Gout   Dyslipidemia   COPD (chronic obstructive pulmonary disease)   Mitral regurgitation   Aortic insufficiency   Atrial fibrillation--tachy/brady and will need pacing to protect from brady assoc with rate control strategies   Alternating (bilateral) bundle branch block--pt meets class 1 indication for pacing. Plan for Bi-V pacing on Monday. Pt not seeming to want ICD.    CKD 3-4 --Cr. Rising    Abnormal TSH 6.4 will check free T4     Cardiomyopathy EF 25-30%    LOS: 2 days   Time spent with pt. :15  minutes. [facility] R  Nurse Practitioner Certified Pager [phone] or after 5pm and on weekends call [phone] 07/14/2013, 7:43 AM

## 2013-07-14 NOTE — Progress Notes (Signed)
Pt. Seen and examined. Agree with the NP/PA-C note as written.  Urine output has slowed down and creatinine is rising, may be at diuresis endpoint. Plan for BI-V pacer tomorrow per Dr. Caryl Comes. Change lasix to 40 mg po BID today for maintenance -1st dose tonight.  NPO p MN for procedure tomorrow.  Pixie Casino, MD, [facility] Attending Cardiologist [place]

## 2013-07-14 NOTE — Progress Notes (Signed)
Pharmacist Heart Failure Core Measure Documentation  Assessment: Levi Lewis has an EF documented as 25-30% on 07/04/13 by ECHO.  Rationale: Heart failure patients with left ventricular systolic dysfunction (LVSD) and an EF < 40% should be prescribed an angiotensin converting enzyme inhibitor (ACEI) or angiotensin receptor blocker (ARB) at discharge unless a contraindication is documented in the medical record.  This patient is not currently on an ACEI or ARB for HF.  This note is being placed in the record in order to provide documentation that a contraindication to the use of these agents is present for this encounter.  ACE Inhibitor or Angiotensin Receptor Blocker is contraindicated (specify all that apply)  []   ACEI allergy AND ARB allergy []   Angioedema []   Moderate or severe aortic stenosis []   Hyperkalemia []   Hypotension []   Renal artery stenosis [x]   Worsening renal function, preexisting renal disease or dysfunction   [name] 07/14/2013 2:47 PM

## 2013-07-15 ENCOUNTER — Encounter (HOSPITAL_COMMUNITY)
Admission: EM | Disposition: A | Discharge status: Home / Self Care | Payer: Self-pay | Source: Home / Self Care | Attending: Cardiovascular Disease

## 2013-07-15 DIAGNOSIS — R55 Syncope and collapse: Secondary | ICD-10-CM

## 2013-07-15 DIAGNOSIS — I452 Bifascicular block: Secondary | ICD-10-CM

## 2013-07-15 DIAGNOSIS — I509 Heart failure, unspecified: Secondary | ICD-10-CM

## 2013-07-15 HISTORY — PX: PERMANENT PACEMAKER INSERTION: SHX5480

## 2013-07-15 HISTORY — PX: BI-VENTRICULAR PACEMAKER INSERTION (CRT-P): SHX5750

## 2013-07-15 LAB — BASIC METABOLIC PANEL
BUN: 38 mg/dL — ABNORMAL HIGH (ref 6–23)
CHLORIDE: 99 meq/L (ref 96–112)
CO2: 27 mEq/L (ref 19–32)
CREATININE: 1.62 mg/dL — AB (ref 0.50–1.35)
Calcium: 8.9 mg/dL (ref 8.4–10.5)
GFR calc non Af Amer: 35 mL/min — ABNORMAL LOW (ref >90)
GFR, EST AFRICAN AMERICAN: 40 mL/min — AB (ref >90)
Glucose, Bld: 119 mg/dL — ABNORMAL HIGH (ref 70–99)
POTASSIUM: 4.1 meq/L (ref 3.7–5.3)
Sodium: 141 mEq/L (ref 137–147)

## 2013-07-15 LAB — TSH: TSH: 5 u[IU]/mL — ABNORMAL HIGH (ref 0.350–4.500)

## 2013-07-15 LAB — T4, FREE: Free T4: 1.16 ng/dL (ref 0.80–1.80)

## 2013-07-15 SURGERY — PERMANENT PACEMAKER INSERTION
Anesthesia: LOCAL

## 2013-07-15 MED ORDER — SODIUM CHLORIDE 0.9 % IV SOLN: INTRAVENOUS | Status: AC

## 2013-07-15 MED ORDER — LIDOCAINE HCL (PF) 1 % IJ SOLN
INTRAMUSCULAR | Status: AC
  Filled 2013-07-15: qty 60

## 2013-07-15 MED ORDER — OMEGA-3-ACID ETHYL ESTERS 1 G PO CAPS
2.0000 g | ORAL_CAPSULE | Freq: Every day | ORAL | Status: DC
  Administered 2013-07-15 – 2013-07-16 (×2): 2 g via ORAL
  Filled 2013-07-15 (×2): qty 2

## 2013-07-15 MED ORDER — CEFAZOLIN SODIUM 1-5 GM-% IV SOLN
1.0000 g | Freq: Four times a day (QID) | INTRAVENOUS | Status: DC
  Administered 2013-07-16: 1 g via INTRAVENOUS
  Filled 2013-07-15 (×3): qty 50

## 2013-07-15 MED ORDER — MIDAZOLAM HCL 5 MG/5ML IJ SOLN
INTRAMUSCULAR | Status: AC
  Filled 2013-07-15: qty 5

## 2013-07-15 MED ORDER — ONDANSETRON HCL 4 MG/2ML IJ SOLN: 4.0000 mg | Freq: Four times a day (QID) | INTRAMUSCULAR | Status: DC | PRN

## 2013-07-15 MED ORDER — CEFAZOLIN SODIUM-DEXTROSE 2-3 GM-% IV SOLR
INTRAVENOUS | Status: AC
  Filled 2013-07-15: qty 50

## 2013-07-15 MED ORDER — CARVEDILOL 3.125 MG PO TABS
3.1250 mg | ORAL_TABLET | Freq: Two times a day (BID) | ORAL | Status: DC
  Filled 2013-07-15 (×3): qty 1

## 2013-07-15 MED ORDER — ACETAMINOPHEN 325 MG PO TABS: 325.0000 mg | ORAL_TABLET | ORAL | Status: DC | PRN

## 2013-07-15 MED ORDER — FENTANYL CITRATE 0.05 MG/ML IJ SOLN
INTRAMUSCULAR | Status: AC
  Filled 2013-07-15: qty 2

## 2013-07-15 NOTE — CV Procedure (Signed)
XYLAN SHEILS [id number]  [id number]  Preop FX:OVAN,VBTY,OMA, AFIB Postop Dx same/   Procedure:DUAL CHAMBER W lv LEAD PLACEMENT  Cx: None   Dictation number [id number]  Virl Axe, MD 07/15/2013 5:49 PM

## 2013-07-15 NOTE — Progress Notes (Signed)
Pt a/o, no c/o pain, pt oob to chair tolerating well, pt NPO for pacemaker insertion today, pt stable

## 2013-07-15 NOTE — Progress Notes (Signed)
Pt had biV placed today in left chest, setting VVI @ 70, scant amt of bld noted on dressing otherwise clean/dry/intact, vss, pt stable

## 2013-07-15 NOTE — Interval H&P Note (Signed)
History and Physical Interval Note:  07/15/2013 4:22 PM  Levi Lewis  has presented today for surgery, with the diagnosis of syncope  The various methods of treatment have been discussed with the patient and family. After consideration of risks, benefits and other options for treatment, the patient has consented to  Procedure(s): PERMANENT PACEMAKER INSERTION (N/A) as a surgical intervention .  The patient's history has been reviewed, patient examined, no change in status, stable for surgery.  I have reviewed the patient's chart and labs.  Questions were answered to the patient's satisfaction.     Virl Axe

## 2013-07-15 NOTE — Progress Notes (Signed)
Patient: Levi Lewis Date of Encounter: 07/15/2013, 10:23 AM Admit date: 07/12/2013     Subjective  Levi Lewis reports he is tired. He denies CP or SOB.   Objective  Physical Exam: Vitals: BP 109/52  Pulse 78  Temp(Src) 97.9 F (36.6 C) (Oral)  Resp 18  Ht 5\' 11"  (1.803 m)  Wt 172 lb 2.9 oz (78.1 kg)  BMI 24.02 kg/m2  SpO2 97% General: Well developed, well appearing 80 year old male in no acute distress. Neck: Supple. JVD not elevated. Lungs: Clear bilaterally to auscultation without wheezes, rales, or rhonchi. Breathing is unlabored. Heart: Regular S1 S2 without murmurs, rubs, or gallops.  Abdomen: Soft, non-distended. Extremities: No clubbing or cyanosis. No edema.  Distal pedal pulses are 2+ and equal bilaterally. Neuro: Alert and oriented X 3. Moves all extremities spontaneously. No focal deficits.  Intake/Output:  Intake/Output Summary (Last 24 hours) at 07/15/13 1023 Last data filed at 07/15/13 0903  Gross per 24 hour  Intake    480 ml  Output      0 ml  Net    480 ml    Inpatient Medications:  . antiseptic oral rinse  15 mL Mouth Rinse q12n4p  . aspirin  81 mg Oral Daily  . beta carotene w/minerals  1 tablet Oral Daily  .  ceFAZolin (ANCEF) IV  2 g Intravenous On Call  . chlorhexidine  15 mL Mouth Rinse BID  . docusate  50 mg Oral Daily  . fenofibrate  54 mg Oral Daily  . fish oil-omega-3 fatty acids  2 g Oral Daily  . furosemide  40 mg Oral BID  . gentamicin irrigation  80 mg Irrigation On Call  . magnesium hydroxide  30 mL Oral Daily  . pantoprazole  40 mg Oral Daily  . sodium chloride  3 mL Intravenous Q12H  . tamsulosin  0.4 mg Oral Daily   . sodium chloride    . sodium chloride 50 mL/hr at 07/15/13 0546    Labs:  Recent Labs  07/12/13 2321  07/14/13 0415 07/15/13 0433  NA 136*  < > 140 141  K 4.2  < > 4.4 4.1  CL 96  < > 99 99  CO2 24  < > 28 27  GLUCOSE 138*  < > 116* 119*  BUN 39*  < > 38* 38*  CREATININE 1.74*  < > 1.89* 1.62*    CALCIUM 8.6  < > 8.9 8.9  MG 2.3  --   --   --   < > = values in this interval not displayed.   Recent Labs  07/12/13 1151  WBC 8.2  HGB 12.9*  HCT 37.0*  MCV 93.2  PLT 295   Serial troponin negative TSH 6.408 Free T4 1.18   Radiology/Studies: Dg Chest 2 View  07/12/2013   CLINICAL DATA:  Shortness of breath.  EXAM: CHEST  2 VIEW  COMPARISON:  07/03/2013  FINDINGS: The cardiac silhouette is likely within normal limits for size for AP technique. Small bilateral pleural effusions do not appear significantly changed. Interstitial markings are again prominent bilaterally. Bibasilar opacities appear mildly increased compared to the prior study. No pneumothorax is identified. No acute osseous abnormality is seen.  IMPRESSION: Persistent, small bilateral pleural effusions with mildly increased basilar predominant lung infiltrates versus edema.   Electronically Signed   By: Logan Bores   On: 07/12/2013 03:13   Echocardiogram Study Conclusions - Left ventricle: Systolic function was severely reduced. The  estimated ejection fraction was in the range of 25% to 30%. When compared to 2010, EF is reduced (45% prior). There is severe hypokinesis of the anteroseptal myocardium. Doppler parameters are consistent with high ventricular filling pressure. - Aortic valve: Mild regurgitation. - Aorta: Aortic root dimension: 54mm (ED). - Mitral valve: Moderate regurgitation. - Left atrium: The atrium was mildly dilated. - Right atrium: The atrium was mildly dilated. - Atrial septum: No defect or patent foramen ovale was identified. - Tricuspid valve: Moderate regurgitation. - Pulmonary arteries: PA peak pressure: 9mm Hg (S). - Pericardium, extracardiac: There was a left pleural effusion.  Telemetry: rate in 70s    Assessment and Plan  1. Tachy-brady syndrome 2. Atrial fibrillation 3. LV dysfunction, EF 25-30% 4. LBBB Plan for CRT-P implantation today.  Signed, Daneesha Quinteros  PA-C

## 2013-07-15 NOTE — H&P (View-Only) (Signed)
ELECTROPHYSIOLOGY CONSULT NOTE    Patient ID: Levi Lewis MRN: [id number], DOB/AGE: [birth year] 78 y.o.  Admit date: 07/12/2013 Date of Consult: 07-13-13  Primary Physician: Criselda Peaches, MD Primary Cardiologist: Ron Parker  Reason for Consultation: atrial fibrillation, conduction system disease  HPI:  Levi Lewis is a 80 y.o. male with a past medical history significant for CAD (calcified coronaries by CT scan in the past), COPD, LBBB, and palpitations.  He has been closely followed by Dr Ron Parker.  Pt has a h/o cardiomyopathy with previous EF of 45% in 07-22-08, with more pronounced LV dysfxn by echo earlier this month - 25-30% (mild AI, mod MR/TR). He has been experiencing palpitations and worsening dyspnea with volume overload recently. He was seen by Dr. Ron Parker on 3/18 with titration of lasix and then again on 3/25 at which point LV dysfxn was noted. Lasix dose was maintained at the visit on the 25th and arrangements were made for a 48 hr holter monitor in the setting of tachypalps. EP f/u was also arranged. He was placed on imdur, which gave him a headache and his dyspnea has subsequently worsened. He presented to the ED for further evaluation.  He was found to be in acute on chronic systolic heart failure and he has diuresed 3L with improvement in symptoms today.   EKG on arrival was questionable for atrial fibrillation.  He also has a long standing hx of LBBB and there was concern for alternating bundles. RBBB noted on ECG 6/14  EP has been asked to evaluate for treatment options.  He endorses shortness of breath, LE edema, palpitations.  He denies chest pain, dizziness, pre-syncope, or syncope.  He does state that occasionally he feels like his heart slows down.  His palpitations are mostly with exertion and are terminated with rest.   Lab work this admission is notable for TSH of 6.408 (no prev known thyroid dysfunction), creat 1.77 (baseline 1.4), negative cardiac enzymes.   Echo 07-04-13  demonstrated EF 25-30%, severe hypokinesis of the anteroseptal myocardium, mild AR,moderate MR, moderate TR, LA 44.    ROS is negative except as outlined above.   Past Medical History  Diagnosis Date  . Left bundle branch block   . CAD (coronary artery disease)   . Hypertension   . Gout   . Dyslipidemia   . Palpitations   . Systolic murmur   . Ejection fraction   . Lung nodules   . COPD (chronic obstructive pulmonary disease)   . Shortness of breath   . Mitral regurgitation   . Aortic insufficiency   . Shoulder pain   . Back pain     Back and knee pain, June, 2014     Surgical History:  Past Surgical History  Procedure Laterality Date  . Back surgery    . Right shoulder    . Right knee surgery    . Tonsillectomy    . Coronary angioplasty       Prescriptions prior to admission  Medication Sig Dispense Refill  . aspirin 81 MG tablet Take 81 mg by mouth daily.        . fenofibrate (TRICOR) 48 MG tablet Take 0.5 tablets (24 mg total) by mouth daily.  45 tablet  3  . fish oil-omega-3 fatty acids 1000 MG capsule Take 2 g by mouth daily.        . furosemide (LASIX) 40 MG tablet Take 1 tablet (40 mg total) by mouth 2 (two) times daily.  180 tablet  1  . isosorbide mononitrate (IMDUR) 30 MG 24 hr tablet Take 1 tablet (30 mg total) by mouth daily.  30 tablet  3  . metoprolol succinate (TOPROL-XL) 50 MG 24 hr tablet Take 1 tablet (50 mg total) by mouth daily. Take with or immediately following a meal.  90 tablet  3  . Multiple Vitamins-Minerals (OCUVITE PO) Take 2 tablets by mouth daily.       Marland Kitchen omeprazole (PRILOSEC) 20 MG capsule Take 20 mg by mouth daily.        . tamsulosin (FLOMAX) 0.4 MG CAPS Take 1 capsule (0.4 mg total) by mouth daily. 1 tab po qd  90 capsule  3  . triazolam (HALCION) 0.25 MG tablet Take 0.25 mg by mouth at bedtime as needed for sleep.         Inpatient Medications:  . antiseptic oral rinse  15 mL Mouth Rinse q12n4p  . aspirin  81 mg Oral Daily  . beta  carotene w/minerals  1 tablet Oral Daily  . chlorhexidine  15 mL Mouth Rinse BID  . fenofibrate  54 mg Oral Daily  . fish oil-omega-3 fatty acids  2 g Oral Daily  . furosemide  80 mg Intravenous BID  . heparin  5,000 Units Subcutaneous 3 times per day  . pantoprazole  40 mg Oral Daily  . sodium chloride  3 mL Intravenous Q12H  . tamsulosin  0.4 mg Oral Daily    Allergies: No Known Allergies  History   Social History  . Marital Status: Married    Spouse Name: N/A    Number of Children: N/A  . Years of Education: N/A   Occupational History  . Not on file.   Social History Main Topics  . Smoking status: Never Smoker   . Smokeless tobacco: Not on file  . Alcohol Use: No  . Drug Use: No  . Sexual Activity: Not Currently   Other Topics Concern  . Not on file   Social History Narrative   He has never smoked or drank. No illcit drug use . He is retired from the [employer] and lives with his wife. Illicit Drug Use- no     Family History  Problem Relation Age of Onset  . Heart attack Sister   . Lung cancer Brother     smoker  . Colon cancer Neg Hx     BP 116/59  Pulse 71  Temp(Src) 97.8 F (36.6 C) (Oral)  Resp 20  Ht 5\' 11"  (1.803 m)  Wt 178 lb 3.2 oz (80.831 kg)  BMI 24.86 kg/m2  SpO2 94% Well developed and nourished in no acute distress HENT normal Neck supple with JVP-flat Carotids brisk and full without bruits Clear Back without kyphosis pectas excavatum Irregularly irregular rate and rhythm with controlled  ventricular response, no murmurs or gallops Abd-soft with active BS without hepatomegaly No Clubbing cyanosis edema Skin-warm and dry A & Oriented  Grossly normal sensory and motor function   Labs:   Lab Results  Component Value Date   WBC 8.2 07/12/2013   HGB 12.9* 07/12/2013   HCT 37.0* 07/12/2013   MCV 93.2 07/12/2013   PLT 295 07/12/2013     Recent Labs Lab 07/13/13 0435  NA 140  K 4.5  CL 98  CO2 26  BUN 39*  CREATININE 1.77*    CALCIUM 8.9  GLUCOSE 114*    Radiology/Studies: Dg Chest 2 View 07/12/2013   CLINICAL DATA:  Shortness of  breath.  EXAM: CHEST  2 VIEW  COMPARISON:  07/03/2013  FINDINGS: The cardiac silhouette is likely within normal limits for size for AP technique. Small bilateral pleural effusions do not appear significantly changed. Interstitial markings are again prominent bilaterally. Bibasilar opacities appear mildly increased compared to the prior study. No pneumothorax is identified. No acute osseous abnormality is seen.  IMPRESSION: Persistent, small bilateral pleural effusions with mildly increased basilar predominant lung infiltrates versus edema.   Electronically Signed   By: Logan Bores   On: 07/12/2013 03:13   Dg Chest 2 View 07/03/2013   CLINICAL DATA:  sob  EXAM: CHEST  2 VIEW  COMPARISON:  DG CHEST 2 VIEW dated 09/28/2012  FINDINGS: The cardiac silhouette is enlarged. There is blunting of the costophrenic angles right greater the left. Areas of increased density projects in the lung bases and right middle lobe and to a lesser extent lingula region. There is otherwise diffuse prominence of interstitial markings. Dextroscoliosis appreciated within the thoracolumbar spine. Chronic changes are identified along the distal right clavicle.  IMPRESSION: Bibasilar infiltrates versus atelectasis as well as small pleural effusions right greater than left. Underlying component of chronic interstitial fibrotic change is also diagnostic consideration.   Electronically Signed   By: Margaree Mackintosh M.D.   On: 07/03/2013 16:25   YKD:XIPJAS fibrillation, ventricular rate 86, QRS 160, LBBB  TELEMETRY: atrial fibrillation, ventricular rates up to 140 w minimal exertion  Imp Recs Principal Problem:   Acute on chronic systolic CHF (congestive heart failure) Active Problems:   Atrial fibrillation   Alternating (bilateral) bundle branch block   Left bundle branch block   Pt meets class 1 indication for pacing given  alternating bundles.  He also has tachy brady and will need pacing to protect from brady assoc with rate control strategies His progressive LV dysfunction begs the question as to why, and rate relatedness in one possibility;  He also has known CA calcifications and wall motion abnormalities which raise concern re CAD althugh myoview 2010 demonstrated wall motion abnormalities without significant perfusion defects He will need CRT   I have also broached the subject is this very healthy 80 yo man  Of IICD  We have also discussed atrial fib and its management, with tachy brady and need for back up brady pacing and ultimately cardioversion which we will delay until post pacing  The benefits and risks were reviewed including but not limited to death,  perforation, infection, lead dislodgement and device malfunction.  The patient understands agrees and is willing to proceed.

## 2013-07-16 ENCOUNTER — Encounter (HOSPITAL_COMMUNITY): Payer: Self-pay | Admitting: *Deleted

## 2013-07-16 ENCOUNTER — Institutional Professional Consult (permissible substitution): Payer: Medicare Other | Admitting: Internal Medicine

## 2013-07-16 ENCOUNTER — Encounter: Payer: Self-pay | Admitting: Internal Medicine

## 2013-07-16 ENCOUNTER — Inpatient Hospital Stay (HOSPITAL_COMMUNITY): Payer: Medicare Other

## 2013-07-16 MED ORDER — CARVEDILOL 6.25 MG PO TABS
6.2500 mg | ORAL_TABLET | Freq: Two times a day (BID) | ORAL | Status: DC
  Administered 2013-07-16: 6.25 mg via ORAL
  Filled 2013-07-16 (×3): qty 1

## 2013-07-16 MED ORDER — CEFAZOLIN SODIUM 1-5 GM-% IV SOLN
1.0000 g | Freq: Four times a day (QID) | INTRAVENOUS | Status: DC
  Filled 2013-07-16: qty 50

## 2013-07-16 MED ORDER — CARVEDILOL 6.25 MG PO TABS: 6.2500 mg | ORAL_TABLET | Freq: Two times a day (BID) | ORAL | Status: DC

## 2013-07-16 MED ORDER — ASPIRIN 81 MG PO TABS
81.0000 mg | ORAL_TABLET | Freq: Every day | ORAL | Status: DC
Start: 2013-07-16 — End: 2013-07-30

## 2013-07-16 NOTE — Progress Notes (Signed)
Orthopedic Tech Progress Note Patient Details:  Levi Lewis 1917-07-29 [id number]  Ortho Devices Type of Ortho Device: Arm sling   Cammer, Theodoro Parma 07/16/2013, 10:50 AM

## 2013-07-16 NOTE — Op Note (Signed)
NAME:  Levi Lewis, Levi Lewis NO.:  [id number]  MEDICAL RECORD NO.:  [id number]  LOCATION:  [id number]                        FACILITY:  [facility]  PHYSICIAN:  Deboraha Sprang, MD, FACCDATE OF BIRTH:  12/24/1917  DATE OF PROCEDURE:  07/15/2013 DATE OF DISCHARGE:                              OPERATIVE REPORT   PREOPERATIVE DIAGNOSES:  Alternating bundle-branch block, cardiomyopathy, congestive heart failure, atrial fibrillation.  POSTOPERATIVE DIAGNOSES:  Alternating bundle-branch block, cardiomyopathy, congestive heart failure, atrial fibrillation.  PROCEDURE:  Dual-chamber pacemaker implantation with left ventricular lead placement.  DESCRIPTION OF PROCEDURE:  Following obtaining informed consent, the patient was brought to the electrophysiology laboratory and placed on the fluoroscopic table in supine position.  After routine prep and drape of the left upper chest, lidocaine was infiltrated in prepectoral subclavicular region.  Incision was made and carried down to the layer of the prepectoral fascia with electrocautery and sharp dissection.  A pocket was formed similarly and hemostasis was obtained.  Thereafter, attention was turned to gain access to the extrathoracic left subclavian vein, which was accomplished without difficulty without the aspiration of air or puncture of the artery.  Three separate venipunctures were accomplished.  Guidewires were placed and retained and sequentially a 9.5-French and 7-French sheaths were placed through which were passed a [employer] 58-cm active fixation atrial lead, model #1688, serial #[id number] CS cannulation system and a 1688 TC 52-cm active fixation atrial lead, serial #[id number].  The ventricular lead was mid of the RV apex where the bipolar R-wave was 9.1 with a pacing impedance of 607, a threshold 0.5 at 0.4, current threshold was 0.7 mA.  There was no diaphragmatic pacing at 10 V.  The current of injury was brisk.  This  lead was secured to the prepectoral fascia.  We then got coronary sinus access with very little difficulty and a flush shot demonstrated cannulation.  We used about 2 mL total of contrast.  The Wholey wire actually cannulated a high lateral branch, and we used a "__________" to allow for placing of the LV lead, model [id number] on a high lateral vein __________ application at the junction of the mid and distal third.  Diaphragmatic stimulation was noted pacing from pulse 2 and 3 and 3 and 4, but not noted on __________.  We did have __________ stim.  The amplitude here was about 5 mV with pacing threshold about 1.2 V, impedance was 649, there was no diaphragmatic pacing in this configuration, and the RV and LV separation with 140 milliseconds.  The 9.5-French sheath was removed and at this point, the atrial lead was deployed to the right atrial appendage with bipolar fibrillation wave about 0.2-0.3 mV, the impedance was 429 ohms.  The atrial lead was secured to the prepectoral fascia.  The left ventricular delivery system was removed and the LV lead was secured.  The leads were then attached to a Dawson pacemaker, serial [id number]. The pocket was copiously irrigated with antibiotic containing saline solution.  Hemostasis was assured.  Leads and the pulse generator were placed in the pocket and secured to the prepectoral fascia.  The wound was then closed in 2 layers in  a normal fashion.  The wound was washed, dried, and a Dermabond dressing was applied.  Needle counts, sponge counts, and instrument counts were correct at the end of procedure according to the staff.  The patient tolerated the procedure without apparent complication.     Deboraha Sprang, MD, [facility]     SCK/MEDQ  D:  07/15/2013  T:  07/16/2013  Job:  [id number]

## 2013-07-16 NOTE — Discharge Instructions (Signed)
° ° °  Supplemental Discharge Instructions for  Pacemaker Patients  Activity No heavy lifting or vigorous activity with your left/right arm for 6 to 8 weeks.  Do not raise your left/right arm above your head for one week.  Gradually raise your affected arm as drawn below.              07/17/13                               07/18/13                     07/19/13                        07/20/13  NO DRIVING for  1 week   ; you may begin driving on   10/17/22  .  WOUND CARE   Keep the wound area clean and dry.  Do not get this area wet for one week. No showers for one week; you may shower on  07/23/13   .   The tape/steri-strips on your wound will fall off; do not pull them off.  No bandage is needed on the site.  DO  NOT apply any creams, oils, or ointments to the wound area.   If you notice any drainage or discharge from the wound, any swelling or bruising at the site, or you develop a fever > 101? F after you are discharged home, call the office at once.  Special Instructions   You are still able to use cellular telephones; use the ear opposite the side where you have your pacemaker/defibrillator.  Avoid carrying your cellular phone near your device.   When traveling through airports, show security personnel your identification card to avoid being screened in the metal detectors.  Ask the security personnel to use the hand wand.   Avoid arc welding equipment, MRI testing (magnetic resonance imaging), TENS units (transcutaneous nerve stimulators).  Call the office for questions about other devices.   Avoid electrical appliances that are in poor condition or are not properly grounded.   Microwave ovens are safe to be near or to operate.

## 2013-07-16 NOTE — Discharge Summary (Signed)
ELECTROPHYSIOLOGY PROCEDURE DISCHARGE SUMMARY    Patient ID: Levi Lewis,  MRN: [id number], DOB/AGE: 1917-10-04 80 y.o.  Admit date: 07/12/2013 Discharge date: 07/16/2013  Primary Care Physician: Criselda Peaches, MD Primary Cardiologist: Ron Parker Electrophysiologist: Caryl Comes  Primary Discharge Diagnosis:  Alternating bundle branch block, NICM, and congestive heart failure  Secondary Discharge Diagnosis:  1.  Atrial fibrillation - newly diagnosed this admission 2.  CAD - calcified coronaries by CT scan 3.  COPD 4.  Hypertension 5.  Dyslipidemia  No Known Allergies  Procedures This Admission:  1.  Echocardiogram 07-04-13 demonstrated EF 25-30%, severe hypokinesis of anteroseptal myocardium, mild AR, moderate MR, moderate TR, LA 44.  2.  Implantation of St Jude CRTP on 07-15-2013 by Dr Caryl Comes. The patient received  STJ Allure pacemaker with model number 3614 right atrial and right ventricular leads.  A STJ quadripolar LV lead was used. There were no early apparent complications. 3.  CXR on 07-16-2013 demonstrated no pneumothorax status post device implantation.   Brief HPI/Hospital Course:  Levi Lewis is a 80 y.o. male with a past medical history significant for CAD (calcified coronaries by CT scan in the past), COPD, LBBB, and palpitations. He has been closely followed by Dr Ron Parker. Pt has a h/o cardiomyopathy with previous EF of 45% in 2008-07-21, with more pronounced LV dysfxn by echo earlier this month - 25-30% (mild AI, mod MR/TR). He has been experiencing palpitations and worsening dyspnea with volume overload recently. He was seen by Dr. Ron Parker on 3/18 with titration of lasix and then again on 3/25 at which point LV dysfxn was noted. Lasix dose was maintained at the visit on the 25th and arrangements were made for a 48 hr holter monitor in the setting of tachypalps. EP f/u was also arranged. He was placed on imdur, which gave him a headache and his dyspnea has subsequently worsened. He  presented to the ED for further evaluation.  He was diuresed a total of 3L this admission.  An EP evaluation was obtained to offer opinion on alternating bundle branch block and atrial fibrillation.  With NICM and baseline LBBB, CRTP implantation was recommended.  Risks, benefits, and alternatives were reviewed with the patient who wished to proceed. The patient underwent implantation of a STJ CRTP with details as outlined above.   He was monitored on telemetry overnight which demonstrated atrial fibrillation with ventricular pacing.  Left chest was without hematoma or ecchymosis.  The device was interrogated and found to be functioning normally.  CXR was obtained and demonstrated no pneumothorax status post device implantation.  Wound care, arm mobility, and restrictions were reviewed with the patient.  Dr Caryl Comes examined the patient and considered them stable for discharge to home.   At discharge, Metoprolol was changed to Coreg in setting of cardiomyopathy.  With persistent atrial fibrillation, Dr Caryl Comes recommended Eliquis 2.5mg  twice daily be started at wound check appointment.  At follow up with Dr Ron Parker, 08-05-13, consideration can be given to [place].  After cardioversion, the patient's device will need to be reprogrammed to a lower rate of 60.   Lab work this admission was also notable for TSH of 5.00, FT4 1.16 - the patient has been instructed to follow up with his PCP for further evaluation.   Discharge Vitals: Blood pressure 115/61, pulse 82, temperature 97.7 F (36.5 C), temperature source Oral, resp. rate 20, height 5\' 11"  (1.803 m), weight 173 lb 11.2 oz (78.79 kg), SpO2 94.00%.   Labs:  Lab Results  Component Value Date   WBC 8.2 07/12/2013   HGB 12.9* 07/12/2013   HCT 37.0* 07/12/2013   MCV 93.2 07/12/2013   PLT 295 07/12/2013     Recent Labs Lab 07/15/13 0433  NA 141  K 4.1  CL 99  CO2 27  BUN 38*  CREATININE 1.62*  CALCIUM 8.9  GLUCOSE 119*    Discharge Medications:      Medication List    STOP taking these medications       isosorbide mononitrate 30 MG 24 hr tablet  Commonly known as:  IMDUR     metoprolol succinate 50 MG 24 hr tablet  Commonly known as:  TOPROL-XL      TAKE these medications       aspirin 81 MG tablet  Take 1 tablet (81 mg total) by mouth daily. Your doctor may put you on another blood thinner at your follow-up appointment for your wound check. When you start the other blood thinner, stop your aspirin.     carvedilol 6.25 MG tablet  Commonly known as:  COREG  Take 1 tablet (6.25 mg total) by mouth 2 (two) times daily with a meal.     fenofibrate 48 MG tablet  Commonly known as:  TRICOR  Take 0.5 tablets (24 mg total) by mouth daily.     fish oil-omega-3 fatty acids 1000 MG capsule  Take 2 g by mouth daily.     furosemide 40 MG tablet  Commonly known as:  LASIX  Take 1 tablet (40 mg total) by mouth 2 (two) times daily.     OCUVITE PO  Take 2 tablets by mouth daily.     omeprazole 20 MG capsule  Commonly known as:  PRILOSEC  Take 20 mg by mouth daily.     tamsulosin 0.4 MG Caps capsule  Commonly known as:  FLOMAX  Take 1 capsule (0.4 mg total) by mouth daily. 1 tab po qd     triazolam 0.25 MG tablet  Commonly known as:  HALCION  Take 0.25 mg by mouth at bedtime as needed for sleep.        Disposition:  Discharge Orders   Future Appointments Provider Department Dept Phone   07/25/2013 10:00 AM [facility] [phone]   08/05/2013 9:45 AM Carlena Bjornstad, MD [facility] [phone]   Future Orders Complete By Expires   Diet - low sodium heart healthy  As directed    Increase activity slowly  As directed    Scheduling Instructions:     Please see attached sheet at the end of your After-Visit Summary for instructions on wound care, activity, and bathing.     Follow-up Information   Follow up with [facility]. (07/25/13 at 10am for  wound check)    Specialty:  Cardiology   Contact information:   [address], [place] [address] [phone]      Follow up with Dola Argyle, MD. (08/05/13 at 9:45am)    Specialty:  Cardiology   Contact information:   [address] [address] [phone]       Duration of Discharge Encounter: Greater than 30 minutes including physician time.  Signed,

## 2013-07-16 NOTE — Progress Notes (Signed)
DC IV, DC Tele, DC Home. Discharge instructions and home medications discussed with patient. Patient denied any questions or concerns at this time. Patient leaving unit via wheelchair and appears in no acute distress.  

## 2013-07-18 NOTE — Telephone Encounter (Signed)
Called patient - he will come in tomorrow at 2 pm to see device clinic.

## 2013-07-19 ENCOUNTER — Ambulatory Visit (INDEPENDENT_AMBULATORY_CARE_PROVIDER_SITE_OTHER): Payer: Medicare Other | Admitting: *Deleted

## 2013-07-19 DIAGNOSIS — Z45018 Encounter for adjustment and management of other part of cardiac pacemaker: Secondary | ICD-10-CM

## 2013-07-19 LAB — MDC_IDC_ENUM_SESS_TYPE_INCLINIC
Battery Remaining Longevity: 54 mo
Battery Voltage: 2.99 V
Implantable Pulse Generator Model: 3242
Implantable Pulse Generator Serial Number: 2978403
Lead Channel Impedance Value: 487.5 Ohm
Lead Channel Impedance Value: 712.5 Ohm
Lead Channel Pacing Threshold Amplitude: 0.75 V
Lead Channel Pacing Threshold Pulse Width: 0.4 ms
Lead Channel Pacing Threshold Pulse Width: 0.4 ms
Lead Channel Setting Pacing Amplitude: 3.5 V
Lead Channel Setting Pacing Pulse Width: 0.4 ms
Lead Channel Setting Sensing Sensitivity: 2 mV
MDC IDC MSMT LEADCHNL LV PACING THRESHOLD AMPLITUDE: 0.75 V
MDC IDC MSMT LEADCHNL RV SENSING INTR AMPL: 12 mV
MDC IDC SESS DTM: 20150403145758
MDC IDC SET LEADCHNL LV PACING AMPLITUDE: 3.5 V
MDC IDC SET LEADCHNL RV PACING PULSEWIDTH: 0.4 ms
MDC IDC STAT BRADY RA PERCENT PACED: 0 %
MDC IDC STAT BRADY RV PERCENT PACED: 91 %

## 2013-07-19 NOTE — Progress Notes (Addendum)
Pt having stim sensations when sleeping on side and sitting. Attempted several LV vectors while reclining. Changed LV vector from P4-M2 to D1-P4. New vector also has better threshold. Had pt lay on exam table to replicate sleeping position---successful pacing w/ new vector w/out stimulation. ROV w/ device clinic for originally scheduled wound check 07/25/13.

## 2013-07-25 ENCOUNTER — Telehealth: Payer: Self-pay | Admitting: Internal Medicine

## 2013-07-25 ENCOUNTER — Ambulatory Visit (INDEPENDENT_AMBULATORY_CARE_PROVIDER_SITE_OTHER): Payer: Medicare Other | Admitting: *Deleted

## 2013-07-25 DIAGNOSIS — I428 Other cardiomyopathies: Secondary | ICD-10-CM

## 2013-07-25 DIAGNOSIS — I429 Cardiomyopathy, unspecified: Secondary | ICD-10-CM

## 2013-07-25 DIAGNOSIS — I4891 Unspecified atrial fibrillation: Secondary | ICD-10-CM

## 2013-07-25 DIAGNOSIS — I498 Other specified cardiac arrhythmias: Secondary | ICD-10-CM

## 2013-07-25 DIAGNOSIS — I5023 Acute on chronic systolic (congestive) heart failure: Secondary | ICD-10-CM

## 2013-07-25 DIAGNOSIS — I509 Heart failure, unspecified: Secondary | ICD-10-CM

## 2013-07-25 LAB — MDC_IDC_ENUM_SESS_TYPE_INCLINIC
Battery Remaining Longevity: 58.8 mo
Battery Voltage: 2.99 V
Brady Statistic RA Percent Paced: 0 %
Brady Statistic RV Percent Paced: 93 %
Implantable Pulse Generator Model: 3242
Implantable Pulse Generator Serial Number: 2978403
Lead Channel Impedance Value: 550 Ohm
Lead Channel Pacing Threshold Amplitude: 1 V
Lead Channel Pacing Threshold Pulse Width: 0.4 ms
Lead Channel Pacing Threshold Pulse Width: 0.4 ms
Lead Channel Pacing Threshold Pulse Width: 0.4 ms
Lead Channel Sensing Intrinsic Amplitude: 12 mV
Lead Channel Setting Pacing Amplitude: 2.75 V
Lead Channel Setting Pacing Amplitude: 3.5 V
MDC IDC MSMT LEADCHNL LV IMPEDANCE VALUE: 712.5 Ohm
MDC IDC MSMT LEADCHNL LV PACING THRESHOLD AMPLITUDE: 1 V
MDC IDC MSMT LEADCHNL LV PACING THRESHOLD AMPLITUDE: 1 V
MDC IDC MSMT LEADCHNL LV PACING THRESHOLD PULSEWIDTH: 0.4 ms
MDC IDC MSMT LEADCHNL RV PACING THRESHOLD AMPLITUDE: 1 V
MDC IDC SESS DTM: 20150409140244
MDC IDC SET LEADCHNL LV PACING PULSEWIDTH: 0.4 ms
MDC IDC SET LEADCHNL RV PACING PULSEWIDTH: 0.4 ms
MDC IDC SET LEADCHNL RV SENSING SENSITIVITY: 2 mV

## 2013-07-25 MED ORDER — APIXABAN 2.5 MG PO TABS: 2.5000 mg | ORAL_TABLET | Freq: Two times a day (BID) | ORAL | Status: DC

## 2013-07-25 NOTE — Telephone Encounter (Signed)
Patient is to start eliquis, 2.5 mg bid. Can he take a different anticoagulant that will be more cost effective? Instructed that he does need to take eliquis until he is informed of a different medication if one is appropriate. Will route to Dr. Caryl Comes.

## 2013-07-25 NOTE — Telephone Encounter (Signed)
New message     eliquis is expensive.  Is there something else he can take?  Pt paid 100.00  For a 30 day supply.

## 2013-07-27 NOTE — Telephone Encounter (Signed)
Alternative options include dabigitran or Rivaroxaban  Or coumadin  Whatever works best for wallet is best

## 2013-07-29 ENCOUNTER — Telehealth: Payer: Self-pay | Admitting: Internal Medicine

## 2013-07-29 NOTE — Telephone Encounter (Signed)
New message     Pt says when he lays on his side, he feels a "thump thump".  Will he need his pacemaker adjusted again?

## 2013-07-29 NOTE — Progress Notes (Signed)
Wound check appointment. Steri-strips removed. Wound without redness or edema. Incision edges approximated, wound well healed. Normal device function. Thresholds, sensing, and impedances consistent with implant measurements. Device programmed at 3.5V for extra safety margin until 3 month visit. Histogram distribution appropriate for patient and level of activity. Persistant AF + Eliquis (started today-ASA D/C'd)---currently programmed VVTR. Patient has an appointment with Dr.Katz on 4-20 to discuss possible DCCV---atrial lead implanted. LV output decreased to 2.75V due to PNS even after vector changes on 4-3. No high ventricular rates noted. Patient educated about wound care, arm mobility, lifting restrictions. ROV with SK on 6-30.

## 2013-07-29 NOTE — Telephone Encounter (Signed)
Appt made for 4-14 @ 3:15pm.

## 2013-07-30 ENCOUNTER — Ambulatory Visit (INDEPENDENT_AMBULATORY_CARE_PROVIDER_SITE_OTHER): Payer: Medicare Other | Admitting: *Deleted

## 2013-07-30 ENCOUNTER — Telehealth: Payer: Self-pay | Admitting: *Deleted

## 2013-07-30 DIAGNOSIS — I5023 Acute on chronic systolic (congestive) heart failure: Secondary | ICD-10-CM

## 2013-07-30 DIAGNOSIS — I447 Left bundle-branch block, unspecified: Secondary | ICD-10-CM

## 2013-07-30 DIAGNOSIS — I4891 Unspecified atrial fibrillation: Secondary | ICD-10-CM

## 2013-07-30 DIAGNOSIS — R002 Palpitations: Secondary | ICD-10-CM

## 2013-07-30 DIAGNOSIS — I509 Heart failure, unspecified: Secondary | ICD-10-CM

## 2013-07-30 LAB — MDC_IDC_ENUM_SESS_TYPE_INCLINIC
Battery Remaining Longevity: 63.6 mo
Battery Voltage: 2.99 V
Brady Statistic RA Percent Paced: 0 %
Date Time Interrogation Session: 20150414192755
Implantable Pulse Generator Serial Number: 2978403
Lead Channel Impedance Value: 487.5 Ohm
Lead Channel Impedance Value: 712.5 Ohm
Lead Channel Pacing Threshold Amplitude: 1 V
Lead Channel Sensing Intrinsic Amplitude: 12 mV
Lead Channel Setting Pacing Amplitude: 2.5 V
Lead Channel Setting Pacing Amplitude: 3.5 V
Lead Channel Setting Pacing Pulse Width: 0.4 ms
Lead Channel Setting Pacing Pulse Width: 0.4 ms
MDC IDC MSMT LEADCHNL LV PACING THRESHOLD AMPLITUDE: 1 V
MDC IDC MSMT LEADCHNL LV PACING THRESHOLD PULSEWIDTH: 0.4 ms
MDC IDC MSMT LEADCHNL LV PACING THRESHOLD PULSEWIDTH: 0.4 ms
MDC IDC PG MODEL: 3242
MDC IDC SET LEADCHNL RV SENSING SENSITIVITY: 2 mV
MDC IDC STAT BRADY RV PERCENT PACED: 92 %

## 2013-07-30 NOTE — Telephone Encounter (Signed)
Pt given alternative options. He will call back with decision

## 2013-07-30 NOTE — Telephone Encounter (Signed)
A user error has taken place: encounter opened in error, closed for administrative reasons.

## 2013-08-02 ENCOUNTER — Encounter: Payer: Self-pay | Admitting: Internal Medicine

## 2013-08-04 ENCOUNTER — Encounter: Payer: Self-pay | Admitting: Cardiology

## 2013-08-04 DIAGNOSIS — Z950 Presence of cardiac pacemaker: Secondary | ICD-10-CM | POA: Insufficient documentation

## 2013-08-04 DIAGNOSIS — Z9229 Personal history of other drug therapy: Secondary | ICD-10-CM | POA: Insufficient documentation

## 2013-08-04 DIAGNOSIS — I5022 Chronic systolic (congestive) heart failure: Secondary | ICD-10-CM | POA: Insufficient documentation

## 2013-08-05 ENCOUNTER — Ambulatory Visit (INDEPENDENT_AMBULATORY_CARE_PROVIDER_SITE_OTHER): Payer: Medicare Other | Admitting: Cardiology

## 2013-08-05 ENCOUNTER — Encounter: Payer: Self-pay | Admitting: Cardiology

## 2013-08-05 VITALS — BP 119/70 | HR 85 | Ht 71.0 in | Wt 180.8 lb

## 2013-08-05 DIAGNOSIS — Z7901 Long term (current) use of anticoagulants: Secondary | ICD-10-CM

## 2013-08-05 DIAGNOSIS — Z9229 Personal history of other drug therapy: Secondary | ICD-10-CM

## 2013-08-05 DIAGNOSIS — I2510 Atherosclerotic heart disease of native coronary artery without angina pectoris: Secondary | ICD-10-CM

## 2013-08-05 DIAGNOSIS — N184 Chronic kidney disease, stage 4 (severe): Secondary | ICD-10-CM

## 2013-08-05 DIAGNOSIS — I10 Essential (primary) hypertension: Secondary | ICD-10-CM

## 2013-08-05 DIAGNOSIS — E079 Disorder of thyroid, unspecified: Secondary | ICD-10-CM

## 2013-08-05 DIAGNOSIS — Z950 Presence of cardiac pacemaker: Secondary | ICD-10-CM

## 2013-08-05 DIAGNOSIS — I5022 Chronic systolic (congestive) heart failure: Secondary | ICD-10-CM

## 2013-08-05 DIAGNOSIS — R9430 Abnormal result of cardiovascular function study, unspecified: Secondary | ICD-10-CM

## 2013-08-05 DIAGNOSIS — I428 Other cardiomyopathies: Secondary | ICD-10-CM

## 2013-08-05 DIAGNOSIS — I509 Heart failure, unspecified: Secondary | ICD-10-CM

## 2013-08-05 DIAGNOSIS — E039 Hypothyroidism, unspecified: Secondary | ICD-10-CM | POA: Insufficient documentation

## 2013-08-05 DIAGNOSIS — R0989 Other specified symptoms and signs involving the circulatory and respiratory systems: Secondary | ICD-10-CM

## 2013-08-05 DIAGNOSIS — I4891 Unspecified atrial fibrillation: Secondary | ICD-10-CM

## 2013-08-05 LAB — BASIC METABOLIC PANEL
BUN: 27 mg/dL — ABNORMAL HIGH (ref 6–23)
CHLORIDE: 108 meq/L (ref 96–112)
CO2: 25 meq/L (ref 19–32)
Calcium: 8.5 mg/dL (ref 8.4–10.5)
Creatinine, Ser: 1.4 mg/dL (ref 0.4–1.5)
GFR: 51.84 mL/min — ABNORMAL LOW (ref >60.00)
Glucose, Bld: 100 mg/dL — ABNORMAL HIGH (ref 70–99)
POTASSIUM: 3.8 meq/L (ref 3.5–5.1)
SODIUM: 140 meq/L (ref 135–145)

## 2013-08-05 NOTE — Assessment & Plan Note (Signed)
Over time we'll see if his left ventricular function improves with biventricular pacing. He is on a small dose of carvedilol. His volume is coming under better control. Over time all make further decisions about other medications for left ventricular dysfunction.

## 2013-08-05 NOTE — Patient Instructions (Signed)
Your physician recommends that you continue on your current medications as directed. Please refer to the Current Medication list given to you today.  Your physician recommends that you return for lab work in: today  Your physician recommends that you schedule a follow-up appointment in: 4 weeks   

## 2013-08-05 NOTE — Assessment & Plan Note (Signed)
His volume status is under much better control. Presumably this is related to his pacing function and the ability to diuresis him with new biventricular pacing in place.

## 2013-08-05 NOTE — Assessment & Plan Note (Signed)
Blood pressure is well controlled. No change in therapy.

## 2013-08-05 NOTE — Progress Notes (Signed)
Patient ID: Levi Lewis, male   DOB: 1917/10/17, 80 y.o.   MRN: [id number]    HPI   Patient is seen today to followup shortness of breath, systolic CHF, atrial fibrillation, anticoagulation. When I saw the patient last on July 10, 2013, I was concerned about what appeared to be alternating bundle branch block. Plans were being made for evaluation by electrophysiology. Ultimately he felt poorly and was hospitalized. Decision was made to place a CRT pacemaker. The plan also was to eventually try to anticoagulate him and possibly proceed with cardioversion of his atrial fibrillation. He was seen back for a wound check. The parameters on his pacemaker were adjusted. He was started on apixaban 2.5 mg twice a day on April 13. This is expensive for him but the price is the same as the other anticoagulants. He is able to afford this and he is taking it. We are giving him any samples available.   He was also noted in the hospital that his TSH had been slightly elevated at 6. A repeat showed a TSH of 5 with a normal free T4. Over time he will follow this up with Dr. Nyoka Cowden. He does not need any testing today.  He looks greatly improved since his last visit with me. It appears that the pacing function and medication dosing has helped him stabilize his rhythm and his volume status. He is not having significant shortness of breath that he was having before. His weight is down to 174 pounds on his scale at home in the morning. He weighs 180 pounds in our office today dressed.  As part of today's evaluation I reviewed the complete hospital records. I've also reviewed the office notes since his hospitalization.  No Known Allergies  Current Outpatient Prescriptions  Medication Sig Dispense Refill  . apixaban (ELIQUIS) 2.5 MG TABS tablet Take 1 tablet (2.5 mg total) by mouth 2 (two) times daily.  60 tablet  6  . carvedilol (COREG) 6.25 MG tablet Take 1 tablet (6.25 mg total) by mouth 2 (two) times daily with a meal.   60 tablet  6  . fenofibrate (TRICOR) 48 MG tablet Take 0.5 tablets (24 mg total) by mouth daily.  45 tablet  3  . fish oil-omega-3 fatty acids 1000 MG capsule Take 2 g by mouth daily.       . furosemide (LASIX) 40 MG tablet Take 1 tablet (40 mg total) by mouth 2 (two) times daily.  180 tablet  1  . Multiple Vitamins-Minerals (OCUVITE PO) Take 2 tablets by mouth daily.       Marland Kitchen omeprazole (PRILOSEC) 20 MG capsule Take 20 mg by mouth daily.       . tamsulosin (FLOMAX) 0.4 MG CAPS Take 1 capsule (0.4 mg total) by mouth daily. 1 tab po qd  90 capsule  3  . triazolam (HALCION) 0.25 MG tablet Take 0.25 mg by mouth at bedtime as needed for sleep.        No current facility-administered medications for this visit.    History   Social History  . Marital Status: Married    Spouse Name: N/A    Number of Children: N/A  . Years of Education: N/A   Occupational History  . Not on file.   Social History Main Topics  . Smoking status: Never Smoker   . Smokeless tobacco: Not on file  . Alcohol Use: No  . Drug Use: No  . Sexual Activity: Not Currently   Other Topics  Concern  . Not on file   Social History Narrative   He has never smoked or drank. No illcit drug use . He is retired from the [employer] and lives with his wife. Illicit Drug Use- no    Family History  Problem Relation Age of Onset  . Heart attack Sister   . Lung cancer Brother     smoker  . Colon cancer Neg Hx     Past Medical History  Diagnosis Date  . Left bundle branch block   . CAD (coronary artery disease)   . Hypertension   . Gout   . Dyslipidemia   . Lung nodules   . COPD (chronic obstructive pulmonary disease)   . Mitral regurgitation   . Aortic insufficiency   . Shoulder pain   . Back pain     Back and knee pain, June, 2014  . Cardiomyopathy, EF by echo 25-30% 07/14/2013  . RBBB   . Atrial fibrillation     Past Surgical History  Procedure Laterality Date  . Back surgery    . Right shoulder    .  Right knee surgery    . Tonsillectomy    . Coronary angioplasty    . Bi-ventricular pacemaker insertion (crt-p)  07-15-2013    STJ Quadra Allura CRTP implanted by Dr Caryl Comes for NICM, CHF, alternating bundle branch blocks    Patient Active Problem List   Diagnosis Date Noted  . Pacemaker 08/04/2013  . Chronic systolic CHF (congestive heart failure) 08/04/2013  . HX: anticoagulation 08/04/2013  . CKD (chronic kidney disease) stage 4, GFR 15-29 ml/min 07/14/2013  . Cardiomyopathy, EF by echo 25-30% 07/14/2013  . Atrial fibrillation 07/13/2013  . Alternating (bilateral) bundle branch block 07/13/2013  . Back pain   . Shoulder pain   . Left bundle branch block   . CAD (coronary artery disease)   . Hypertension   . Gout   . Dyslipidemia   . Ejection fraction   . Lung nodules   . COPD (chronic obstructive pulmonary disease)   . Mitral regurgitation   . Aortic insufficiency     ROS   Patient denies fever, chills, headache, sweats, rash, change in vision, change in hearing, chest pain, cough, nausea or vomiting, urinary symptoms. All other systems are reviewed and are negative.  PHYSICAL EXAM  Patient is oriented to person time and place. Affect is normal. He's here with a family member. He looks quite good. There is no jugular venous distention. Lungs reveal a few scattered rhonchi and possibly rales. There is no respiratory distress. Cardiac exam reveals S1 and S2. The abdomen is soft. There is no significant peripheral edema. His pacer site is nicely healed. There no musculoskeletal deformities. There are no skin rashes.  Filed Vitals:   08/05/13 0940  BP: 119/70  Pulse: 85  Height: 5\' 11"  (1.803 m)  Weight: 180 lb 12.8 oz (82.01 kg)      ASSESSMENT & PLAN

## 2013-08-05 NOTE — Assessment & Plan Note (Signed)
He looks much better with his pacemaker in place.

## 2013-08-05 NOTE — Assessment & Plan Note (Signed)
Chemistry is being checked again today.

## 2013-08-05 NOTE — Assessment & Plan Note (Signed)
The patient's most recent echo revealed that his LV function was worse. It is possible that he may have had coronary events. However he is not having chest pain. I've chosen at this point to not aggressively evaluate his coronary status.

## 2013-08-05 NOTE — Assessment & Plan Note (Signed)
His TSH was in the range of 6 in the hospital. It was repeated with a followup value of 5. Free T4 was normal. No further testing at this time. He will followup with Dr. Nyoka Cowden concerning this issue over time.

## 2013-08-05 NOTE — Assessment & Plan Note (Signed)
His atrial fib rate currently is controlled. He is now been anticoagulated since July 29, 2013. I will continue his anticoagulation. I will see him back in several weeks. At that time I will reassess his overall status. We had a careful discussion today about the possibility of proceeding with cardioversion. He and his family understand the pros and cons. I will make that decision with him fairly soon.

## 2013-08-05 NOTE — Assessment & Plan Note (Signed)
Anticoagulation was started July 29, 2013. After I see him back in several weeks he will a been anticoagulated for greater than 3 weeks. We will decide at that time if it is prudent to proceed with cardioversion.  As part of today's evaluation I spent greater than 25 minutes with is total care. More than half of this time was with direct contact with the patient and his family. We talked about all aspects of his care including the possibility of cardioversion.

## 2013-08-08 NOTE — Addendum Note (Signed)
Addended by: Shiela Mayer on: 08/08/2013 02:11 PM   Modules accepted: Level of Service

## 2013-08-08 NOTE — Progress Notes (Signed)
Pacemaker interrogation due to PNS (n/c). Although patient states that the PNS has gotten better with the recent changes, he still notices it HS while lying on his left side. PNS could not be recreated in the ofc today in the same position even at greater outputs and in different vectors. LV output lowered to 2.5V. Patient is currently BiVp 92%---?PVCs. Patient to follow up as scheduled.

## 2013-08-14 NOTE — Telephone Encounter (Signed)
Pt saw Dr. Ron Parker in office on 4/20, pt states that he is going to remain on Eliquis (because they are all expensive).

## 2013-08-15 ENCOUNTER — Encounter: Payer: Self-pay | Admitting: *Deleted

## 2013-08-15 ENCOUNTER — Telehealth: Payer: Self-pay | Admitting: Cardiology

## 2013-08-15 NOTE — Telephone Encounter (Signed)
New message     Pt has swelling in stomach and left leg/foot.  Son concerned about fluid overload.

## 2013-08-15 NOTE — Telephone Encounter (Addendum)
**Note De-Identified  Obfuscation** The pt states that he weighed 180 lbs. last night before bed and that his stomach was bloated so he called his son who went to check on him. The pt says that he has swelling in his left foot/ leg and some in his right leg and is concerned that he may be holding too much fluid and wants to know what Dr Ron Parker recommends. The pt denies SOB.  The pts, son, Legrand Como, states that he is going out of town and will be leaving this afternoon and because he is concerned about the pt he request that I contact Dr Ron Parker for recommendations. I will get in touch with Dr Ron Parker.

## 2013-08-15 NOTE — Telephone Encounter (Addendum)
**Note De-Identified  Obfuscation** I called and advised Legrand Como, the pts son, that per Dr Ron Parker the pt should elevate his legs as often as possible, make sure he takes his Furosemide as directed and to avoid salt and excessive fluids. He verbalized understanding and asked me to call the pt and give hime these recommendations as well. I called the pt and advised him of Dr Ron Parker recommendations and gave reassurance, he verbalized understanding.

## 2013-08-15 NOTE — Telephone Encounter (Signed)
**Note De-Identified  Obfuscation** Legrand Como is advised to call the pt (he states that his dad does not hear well) to find out how much weight the pt has gained, if he is taking his furosemide as directed and if the pt has SOB and to call me back. He verbalized understanding.

## 2013-08-15 NOTE — Telephone Encounter (Signed)
**Note De-Identified  Obfuscation** Levi Lewis states that the pt weighed 176 lbs. on 4/20 (pt had f/u on 4/20 and his office weight was 180 lbs.)  and he weighs 178 lbs this am. He states that the pt is c/o left leg/foot swelling and that his pants are tighter due to bloating in his stomach. I will call the pt to get a better idea of his symptoms, if possible.

## 2013-08-17 ENCOUNTER — Encounter: Payer: Self-pay | Admitting: Internal Medicine

## 2013-08-20 ENCOUNTER — Encounter: Payer: Self-pay | Admitting: Internal Medicine

## 2013-08-23 ENCOUNTER — Encounter: Payer: Self-pay | Admitting: Internal Medicine

## 2013-08-30 ENCOUNTER — Encounter: Payer: Self-pay | Admitting: Internal Medicine

## 2013-09-03 ENCOUNTER — Encounter: Payer: Self-pay | Admitting: Cardiology

## 2013-09-03 ENCOUNTER — Ambulatory Visit (INDEPENDENT_AMBULATORY_CARE_PROVIDER_SITE_OTHER): Payer: Medicare Other | Admitting: Cardiology

## 2013-09-03 VITALS — BP 124/72 | HR 88 | Ht 71.0 in | Wt 180.0 lb

## 2013-09-03 DIAGNOSIS — I5022 Chronic systolic (congestive) heart failure: Secondary | ICD-10-CM

## 2013-09-03 DIAGNOSIS — I509 Heart failure, unspecified: Secondary | ICD-10-CM

## 2013-09-03 DIAGNOSIS — I2510 Atherosclerotic heart disease of native coronary artery without angina pectoris: Secondary | ICD-10-CM

## 2013-09-03 DIAGNOSIS — T148XXA Other injury of unspecified body region, initial encounter: Secondary | ICD-10-CM

## 2013-09-03 DIAGNOSIS — Z01810 Encounter for preprocedural cardiovascular examination: Secondary | ICD-10-CM

## 2013-09-03 DIAGNOSIS — R238 Other skin changes: Secondary | ICD-10-CM

## 2013-09-03 DIAGNOSIS — I4891 Unspecified atrial fibrillation: Secondary | ICD-10-CM

## 2013-09-03 NOTE — Patient Instructions (Signed)
**Note De-Identified  Obfuscation** Your physician recommends that you continue on your current medications as directed. Please refer to the Current Medication list given to you today.  Your physician recommends that you return for lab work in: Tuesday May 26. You may come to lab any time that day . The lab is open from 7:30 am until 5:00 pm.  Your physician recommends that you schedule a follow-up appointment in: 3 to 4 weeks  I will contact you on Friday to give you the time and instructions for your cardioversion

## 2013-09-03 NOTE — Assessment & Plan Note (Signed)
The patient's volume status is nicely controlled. He has some shortness of breath.

## 2013-09-03 NOTE — Addendum Note (Signed)
Addended by: Dennie Fetters on: 09/03/2013 05:48 PM   Modules accepted: Orders

## 2013-09-03 NOTE — Assessment & Plan Note (Addendum)
The patient had atrial fibrillation and now has atrial flutter. He does feel increased heart rate when he tries to do activities. He is anticoagulated. It is therefore appropriate to proceed with cardioversion. The patient is in agreement. We've had a careful discussion about the pros and cons of doing this. This will be arranged for Sep 13, 2013. I will do the procedure.  As part of today's evaluation I spent greater than 25 minutes with his total care. More than half of this time has been with evaluating his rhythm and discussing with him a plan to proceed with cardioversion.

## 2013-09-03 NOTE — Assessment & Plan Note (Signed)
Patient has multiple blisters on the side of his foot in the bottom of his feet. He will be returning for further follow up with Dr. Nyoka Cowden concerning this

## 2013-09-03 NOTE — Progress Notes (Signed)
Patient ID: Levi Lewis, male   DOB: September 23, 1917, 80 y.o.   MRN: [id number]    HPI  Patient is seen today to followup his atrial fib and CHF. He looks and feels much better. He is now anticoagulated. The next decision will be whether we will try to cardiovert him.  No Known Allergies  Current Outpatient Prescriptions  Medication Sig Dispense Refill  . apixaban (ELIQUIS) 2.5 MG TABS tablet Take 1 tablet (2.5 mg total) by mouth 2 (two) times daily.  60 tablet  6  . carvedilol (COREG) 6.25 MG tablet Take 1 tablet (6.25 mg total) by mouth 2 (two) times daily with a meal.  60 tablet  6  . fenofibrate (TRICOR) 48 MG tablet Take 0.5 tablets (24 mg total) by mouth daily.  45 tablet  3  . fish oil-omega-3 fatty acids 1000 MG capsule Take 2 g by mouth daily.       . furosemide (LASIX) 40 MG tablet Take 1 tablet (40 mg total) by mouth 2 (two) times daily.  180 tablet  1  . Multiple Vitamins-Minerals (OCUVITE PO) Take 2 tablets by mouth daily.       Marland Kitchen omeprazole (PRILOSEC) 20 MG capsule Take 20 mg by mouth daily.       . tamsulosin (FLOMAX) 0.4 MG CAPS Take 1 capsule (0.4 mg total) by mouth daily. 1 tab po qd  90 capsule  3  . triazolam (HALCION) 0.25 MG tablet Take 0.25 mg by mouth at bedtime as needed for sleep.        No current facility-administered medications for this visit.    History   Social History  . Marital Status: Married    Spouse Name: N/A    Number of Children: N/A  . Years of Education: N/A   Occupational History  . Not on file.   Social History Main Topics  . Smoking status: Never Smoker   . Smokeless tobacco: Not on file  . Alcohol Use: No  . Drug Use: No  . Sexual Activity: Not Currently   Other Topics Concern  . Not on file   Social History Narrative   He has never smoked or drank. No illcit drug use . He is retired from the [employer] and lives with his wife. Illicit Drug Use- no    Family History  Problem Relation Age of Onset  . Heart attack Sister     . Lung cancer Brother     smoker  . Colon cancer Neg Hx     Past Medical History  Diagnosis Date  . Left bundle branch block   . CAD (coronary artery disease)   . Hypertension   . Gout   . Dyslipidemia   . Lung nodules   . COPD (chronic obstructive pulmonary disease)   . Mitral regurgitation   . Aortic insufficiency   . Shoulder pain   . Back pain     Back and knee pain, June, 2014  . Cardiomyopathy, EF by echo 25-30% 07/14/2013  . RBBB   . Atrial fibrillation     Past Surgical History  Procedure Laterality Date  . Back surgery    . Right shoulder    . Right knee surgery    . Tonsillectomy    . Coronary angioplasty    . Bi-ventricular pacemaker insertion (crt-p)  07-15-2013    STJ Quadra Allura CRTP implanted by Dr Caryl Comes for NICM, CHF, alternating bundle branch blocks    Patient Active Problem List  Diagnosis Date Noted  . Thyroid condition 08/05/2013  . Pacemaker 08/04/2013  . Chronic systolic CHF (congestive heart failure) 08/04/2013  . HX: anticoagulation 08/04/2013  . CKD (chronic kidney disease) stage 4, GFR 15-29 ml/min 07/14/2013  . Cardiomyopathy, EF by echo 25-30% 07/14/2013  . Atrial fibrillation 07/13/2013  . Alternating (bilateral) bundle branch block 07/13/2013  . Back pain   . Shoulder pain   . Left bundle branch block   . CAD (coronary artery disease)   . Hypertension   . Gout   . Dyslipidemia   . Ejection fraction   . Lung nodules   . COPD (chronic obstructive pulmonary disease)   . Mitral regurgitation   . Aortic insufficiency     ROS   Patient denies fever, chills, headache, sweats, rash, change in vision, change in hearing, chest pain, cough, nausea vomiting, urinary symptoms. All other systems are reviewed and are negative.  PHYSICAL EXAM  Patient is quite stable today. He is here with his family member. He is oriented to person time and place. Affect is normal. Head is atraumatic. Sclera and conjunctiva are normal. There is no  jugulovenous distention. Lungs are clear. Respiratory effort is nonlabored. Cardiac exam reveals S1 and S2. The rhythm is regular. Abdomen is soft. There is no peripheral edema. There no musculoskeletal deformities. He has some blisters on his feet.  Filed Vitals:   09/03/13 1626  BP: 124/72  Pulse: 88  Height: 5\' 11"  (1.803 m)  Weight: 180 lb (81.647 kg)   EKG is done today and reviewed by me. The rhythm is paced. His pacemaker will be interrogated to help me learn his underlying rhythm. The interrogation shows that his underlying rhythm is atrial flutter.  ASSESSMENT & PLAN

## 2013-09-03 NOTE — Assessment & Plan Note (Signed)
His most recent ejection fraction was reduced. He is on appropriate medications.

## 2013-09-03 NOTE — Assessment & Plan Note (Signed)
We presume that he has coronary disease. Cardiac catheterization has not been done.

## 2013-09-05 ENCOUNTER — Encounter: Payer: Self-pay | Admitting: Cardiology

## 2013-09-06 ENCOUNTER — Telehealth: Payer: Self-pay

## 2013-09-06 ENCOUNTER — Encounter (HOSPITAL_COMMUNITY): Payer: Self-pay | Admitting: Pharmacy Technician

## 2013-09-06 NOTE — Telephone Encounter (Signed)
The pt and his son, Levi Lewis, are advised that the pts cardioversion is scheduled for 5/29 at 2 pm with Dr Ron Parker. I went over the instructions with the pts son who verbalized understanding. The pt is scheduled to have pre procedure labs drawn at this office on Tuesday 5/26 and a 2 week post cardioversion f/u with Dr Ron Parker on 6/12 at 9:15., the pts son Levi Lewis is aware of all and agrees with the plan.

## 2013-09-10 ENCOUNTER — Other Ambulatory Visit (INDEPENDENT_AMBULATORY_CARE_PROVIDER_SITE_OTHER): Payer: Medicare Other

## 2013-09-10 DIAGNOSIS — Z01810 Encounter for preprocedural cardiovascular examination: Secondary | ICD-10-CM

## 2013-09-10 DIAGNOSIS — I4891 Unspecified atrial fibrillation: Secondary | ICD-10-CM

## 2013-09-10 LAB — CBC WITH DIFFERENTIAL/PLATELET
BASOS ABS: 0 10*3/uL (ref 0.0–0.1)
Basophils Relative: 0.4 % (ref 0.0–3.0)
Eosinophils Absolute: 0.3 10*3/uL (ref 0.0–0.7)
Eosinophils Relative: 4.5 % (ref 0.0–5.0)
HEMATOCRIT: 34.8 % — AB (ref 39.0–52.0)
HEMOGLOBIN: 11.8 g/dL — AB (ref 13.0–17.0)
LYMPHS ABS: 2.5 10*3/uL (ref 0.7–4.0)
Lymphocytes Relative: 35.3 % (ref 12.0–46.0)
MCHC: 33.9 g/dL (ref 30.0–36.0)
MCV: 91.8 fl (ref 78.0–100.0)
Monocytes Absolute: 0.9 10*3/uL (ref 0.1–1.0)
Monocytes Relative: 12.7 % — ABNORMAL HIGH (ref 3.0–12.0)
NEUTROS ABS: 3.4 10*3/uL (ref 1.4–7.7)
Neutrophils Relative %: 47.1 % (ref 43.0–77.0)
Platelets: 276 10*3/uL (ref 150.0–400.0)
RBC: 3.79 Mil/uL — AB (ref 4.22–5.81)
RDW: 13.6 % (ref 11.5–15.5)
WBC: 7.1 10*3/uL (ref 4.0–10.5)

## 2013-09-10 LAB — BASIC METABOLIC PANEL
BUN: 26 mg/dL — ABNORMAL HIGH (ref 6–23)
CALCIUM: 8.6 mg/dL (ref 8.4–10.5)
CO2: 25 mEq/L (ref 19–32)
Chloride: 108 mEq/L (ref 96–112)
Creatinine, Ser: 1.4 mg/dL (ref 0.4–1.5)
GFR: 48.52 mL/min — ABNORMAL LOW (ref >60.00)
GLUCOSE: 89 mg/dL (ref 70–99)
Potassium: 3.8 mEq/L (ref 3.5–5.1)
Sodium: 140 mEq/L (ref 135–145)

## 2013-09-11 ENCOUNTER — Other Ambulatory Visit: Payer: Self-pay | Admitting: Cardiology

## 2013-09-11 DIAGNOSIS — I4891 Unspecified atrial fibrillation: Secondary | ICD-10-CM

## 2013-09-11 NOTE — H&P (Signed)
Patient ID: Levi Lewis, male   DOB: 12-31-1917, 80 y.o.   MRN: [id number]  History and physical for cardioversion on Sep 13, 2013   HPI  Patient is seen today to followup his atrial fib and CHF. He looks and feels much better. He is now anticoagulated. The next decision will be whether we will try to cardiovert him.   No Known Allergies    Current Outpatient Prescriptions   Medication  Sig  Dispense  Refill   .  apixaban (ELIQUIS) 2.5 MG TABS tablet  Take 1 tablet (2.5 mg total) by mouth 2 (two) times daily.   60 tablet   6   .  carvedilol (COREG) 6.25 MG tablet  Take 1 tablet (6.25 mg total) by mouth 2 (two) times daily with a meal.   60 tablet   6   .  fenofibrate (TRICOR) 48 MG tablet  Take 0.5 tablets (24 mg total) by mouth daily.   45 tablet   3   .  fish oil-omega-3 fatty acids 1000 MG capsule  Take 2 g by mouth daily.          .  furosemide (LASIX) 40 MG tablet  Take 1 tablet (40 mg total) by mouth 2 (two) times daily.   180 tablet   1   .  Multiple Vitamins-Minerals (OCUVITE PO)  Take 2 tablets by mouth daily.          Marland Kitchen  omeprazole (PRILOSEC) 20 MG capsule  Take 20 mg by mouth daily.          .  tamsulosin (FLOMAX) 0.4 MG CAPS  Take 1 capsule (0.4 mg total) by mouth daily. 1 tab po qd   90 capsule   3   .  triazolam (HALCION) 0.25 MG tablet  Take 0.25 mg by mouth at bedtime as needed for sleep.              No current facility-administered medications for this visit.         History       Social History   .  Marital Status:  Married       Spouse Name:  N/A       Number of Children:  N/A   .  Years of Education:  N/A       Occupational History   .  Not on file.       Social History Main Topics   .  Smoking status:  Never Smoker    .  Smokeless tobacco:  Not on file   .  Alcohol Use:  No   .  Drug Use:  No   .  Sexual Activity:  Not Currently       Other Topics  Concern   .  Not on file       Social History Narrative     He has never  smoked or drank. No illcit drug use . He is retired from the [employer] and lives with his wife. Illicit Drug Use- no         Family History   Problem  Relation  Age of Onset   .  Heart attack  Sister     .  Lung cancer  Brother         smoker   .  Colon cancer  Neg Hx  Past Medical History   Diagnosis  Date   .  Left bundle branch block     .  CAD (coronary artery disease)     .  Hypertension     .  Gout     .  Dyslipidemia     .  Lung nodules     .  COPD (chronic obstructive pulmonary disease)     .  Mitral regurgitation     .  Aortic insufficiency     .  Shoulder pain     .  Back pain         Back and knee pain, June, 2014   .  Cardiomyopathy, EF by echo 25-30%  07/14/2013   .  RBBB     .  Atrial fibrillation           Past Surgical History   Procedure  Laterality  Date   .  Back surgery       .  Right shoulder       .  Right knee surgery       .  Tonsillectomy       .  Coronary angioplasty       .  Bi-ventricular pacemaker insertion (crt-p)    07-15-2013       STJ Quadra Allura CRTP implanted by Dr Caryl Comes for NICM, CHF, alternating bundle branch blocks         Patient Active Problem List     Diagnosis  Date Noted   .  Thyroid condition  08/05/2013   .  Pacemaker  08/04/2013   .  Chronic systolic CHF (congestive heart failure)  08/04/2013   .  HX: anticoagulation  08/04/2013   .  CKD (chronic kidney disease) stage 4, GFR 15-29 ml/min  07/14/2013   .  Cardiomyopathy, EF by echo 25-30%  07/14/2013   .  Atrial fibrillation  07/13/2013   .  Alternating (bilateral) bundle branch block  07/13/2013   .  Back pain     .  Shoulder pain     .  Left bundle branch block     .  CAD (coronary artery disease)     .  Hypertension     .  Gout     .  Dyslipidemia     .  Ejection fraction     .  Lung nodules     .  COPD (chronic obstructive pulmonary disease)     .  Mitral regurgitation     .  Aortic insufficiency          ROS    Patient denies  fever, chills, headache, sweats, rash, change in vision, change in hearing, chest pain, cough, nausea vomiting, urinary symptoms. All other systems are reviewed and are negative.   PHYSICAL EXAM  Patient is quite stable today. He is here with his family member. He is oriented to person time and place. Affect is normal. Head is atraumatic. Sclera and conjunctiva are normal. There is no jugulovenous distention. Lungs are clear. Respiratory effort is nonlabored. Cardiac exam reveals S1 and S2. The rhythm is regular. Abdomen is soft. There is no peripheral edema. There no musculoskeletal deformities. He has some blisters on his feet.    Filed Vitals:     09/03/13 1626   BP:  124/72   Pulse:  88   Height:  5\' 11"  (1.803 m)   Weight:  180 lb (81.647 kg)      EKG is  done today and reviewed by me. The rhythm is paced. His pacemaker will be interrogated to help me learn his underlying rhythm. The interrogation shows that his underlying rhythm is atrial flutter.   ASSESSMENT & PLAN            CAD (coronary artery disease) - Carlena Bjornstad, MD at 09/03/2013  5:13 PM    Status: Written Related Problem: CAD (coronary artery disease)    We presume that he has coronary disease. Cardiac catheterization has not been done.         Ejection fraction - Carlena Bjornstad, MD at 09/03/2013  5:13 PM    Status: Written Related Problem: Ejection fraction    His most recent ejection fraction was reduced. He is on appropriate medications.         Chronic systolic CHF (congestive heart failure) - Carlena Bjornstad, MD at 09/03/2013  5:14 PM    Status: Written Related Problem: Chronic systolic CHF (congestive heart failure)    The patient's volume status is nicely controlled. He has some shortness of breath.         Multiple blisters - Carlena Bjornstad, MD at 09/03/2013  5:14 PM    Status: Written Related Problem: Multiple blisters    Patient has multiple blisters on the side of his foot in the bottom of his feet. He  will be returning for further follow up with Dr. Nyoka Cowden concerning this         Atrial fibrillation - Carlena Bjornstad, MD at 09/03/2013  5:15 PM    Status: Alison Stalling Related Problem: Atrial fibrillation    The patient had atrial fibrillation and now has atrial flutter. He does feel increased heart rate when he tries to do activities. He is anticoagulated. It is therefore appropriate to proceed with cardioversion. The patient is in agreement. We've had a careful discussion about the pros and cons of doing this. This will be arranged for Sep 13, 2013. I will do the procedure.  Daryel November, MD

## 2013-09-13 ENCOUNTER — Encounter (HOSPITAL_COMMUNITY)
Admission: RE | Disposition: A | Discharge status: Home / Self Care | Payer: Medicare Other | Source: Ambulatory Visit | Attending: Cardiology

## 2013-09-13 ENCOUNTER — Ambulatory Visit (HOSPITAL_COMMUNITY): Payer: Medicare Other | Admitting: Certified Registered"

## 2013-09-13 ENCOUNTER — Encounter (HOSPITAL_COMMUNITY): Payer: Self-pay

## 2013-09-13 ENCOUNTER — Encounter (HOSPITAL_COMMUNITY): Payer: Medicare Other | Admitting: Certified Registered"

## 2013-09-13 ENCOUNTER — Ambulatory Visit (HOSPITAL_COMMUNITY)
Admission: RE | Admit: 2013-09-13 | Discharge: 2013-09-13 | Disposition: A | Discharge status: Home / Self Care | Payer: Medicare Other | Source: Ambulatory Visit | Attending: Cardiology | Admitting: Cardiology

## 2013-09-13 DIAGNOSIS — J449 Chronic obstructive pulmonary disease, unspecified: Secondary | ICD-10-CM | POA: Insufficient documentation

## 2013-09-13 DIAGNOSIS — I10 Essential (primary) hypertension: Secondary | ICD-10-CM | POA: Insufficient documentation

## 2013-09-13 DIAGNOSIS — I509 Heart failure, unspecified: Secondary | ICD-10-CM | POA: Insufficient documentation

## 2013-09-13 DIAGNOSIS — J4489 Other specified chronic obstructive pulmonary disease: Secondary | ICD-10-CM | POA: Insufficient documentation

## 2013-09-13 DIAGNOSIS — Z950 Presence of cardiac pacemaker: Secondary | ICD-10-CM | POA: Insufficient documentation

## 2013-09-13 DIAGNOSIS — I4891 Unspecified atrial fibrillation: Secondary | ICD-10-CM | POA: Insufficient documentation

## 2013-09-13 DIAGNOSIS — I2510 Atherosclerotic heart disease of native coronary artery without angina pectoris: Secondary | ICD-10-CM | POA: Insufficient documentation

## 2013-09-13 HISTORY — PX: CARDIOVERSION: SHX1299

## 2013-09-13 SURGERY — CARDIOVERSION
Anesthesia: Monitor Anesthesia Care

## 2013-09-13 MED ORDER — SODIUM CHLORIDE 0.9 % IV SOLN
INTRAVENOUS | Status: DC
  Administered 2013-09-13: 500 mL via INTRAVENOUS

## 2013-09-13 MED ORDER — PROPOFOL 10 MG/ML IV BOLUS
INTRAVENOUS | Status: DC | PRN
  Administered 2013-09-13: 50 mg via INTRAVENOUS

## 2013-09-13 NOTE — CV Procedure (Signed)
The patient is stable and ready for cardioversion. He has been well anticoagulated.  Proper consent is signed. A proper timeout was completed. The patient's pacemaker was interrogated showing that he had underlying atrial fib or atrial flutter.  Anesthesia was present. Anterior posterior pads were placed using a biphasic defibrillator. The patient received 60 mg of IV propofol.  The patient was shocked once with 120 J. He converted to sinus rhythm. This was documented by repeat interrogation of the pacer.  Successful cardioversion 1 shock with 120 J As the patient wakes up he will be able to be discharged home with his family. He has a followup appointment to see me in the office. He will remain on the same medications.

## 2013-09-13 NOTE — Transfer of Care (Signed)
Immediate Anesthesia Transfer of Care Note  Patient: Levi Lewis  Procedure(s) Performed: Procedure(s): CARDIOVERSION (N/A)  Patient Location: Endoscopy Unit  Anesthesia Type:General  Level of Consciousness: awake, alert  and oriented  Airway & Oxygen Therapy: Patient Spontanous Breathing  Post-op Assessment: Report given to PACU RN, Post -op Vital signs reviewed and stable and Patient moving all extremities X 4  Post vital signs: Reviewed and stable  Complications: No apparent anesthesia complications

## 2013-09-13 NOTE — Anesthesia Preprocedure Evaluation (Addendum)
Anesthesia Evaluation  Patient identified by MRN, date of birth, ID band Patient awake    Airway Mallampati: I TM Distance: >3 FB Neck ROM: Full    Dental  (+) Teeth Intact, Dental Advisory Given   Pulmonary COPD breath sounds clear to auscultation        Cardiovascular hypertension, + CAD and +CHF + dysrhythmias + pacemaker Rhythm:Regular Rate:Normal     Neuro/Psych    GI/Hepatic   Endo/Other    Renal/GU Renal disease     Musculoskeletal   Abdominal   Peds  Hematology   Anesthesia Other Findings   Reproductive/Obstetrics                          Anesthesia Physical Anesthesia Plan  ASA: III  Anesthesia Plan:    Post-op Pain Management:    Induction:   Airway Management Planned:   Additional Equipment:   Intra-op Plan:   Post-operative Plan:   Informed Consent: I have reviewed the patients History and Physical, chart, labs and discussed the procedure including the risks, benefits and alternatives for the proposed anesthesia with the patient or authorized representative who has indicated his/her understanding and acceptance.     Plan Discussed with: CRNA and Anesthesiologist  Anesthesia Plan Comments:        Anesthesia Quick Evaluation

## 2013-09-13 NOTE — H&P (Signed)
  There is a complete explanation of the plan for the procedure today already documented. Physical exam is unchanged today. The patient is stable. We are ready to proceed with cardioversion.  Daryel November, MD

## 2013-09-13 NOTE — Discharge Instructions (Signed)
Electrical Cardioversion °Electrical cardioversion is the delivery of a jolt of electricity to change the rhythm of the heart. Sticky patches or metal paddles are placed on the chest to deliver the electricity from a device. This is done to restore a normal rhythm. A rhythm that is too fast or not regular keeps the heart from pumping well. °Electrical cardioversion is done in an emergency if:  °· There is low or no blood pressure as a result of the heart rhythm.   °· Normal rhythm must be restored as fast as possible to protect the brain and heart from further damage.   °· It may save a life. °Cardioversion may be done for heart rhythms that are not immediately life-threatening, such as atrial fibrillation or flutter, in which:  °· The heart is beating too fast or is not regular.   °· Medicine to change the rhythm has not worked.   °· It is safe to wait in order to allow time for preparation. °· Symptoms of the abnormal rhythm are bothersome. °· The risk of stroke and other serious complications can be reduced. °LET YOUR CAREGIVER KNOW ABOUT:  °· All medicines you are taking, including vitamins, herbs, eye drops, creams, and over-the-counter medicines.   °· Previous problems you or members of your family have had with the use of anesthetics.   °· Any blood disorders you have.   °· Previous surgeries you have had.   °· Medical conditions you have. °RISKS AND COMPLICATIONS  °Generally, this is a safe procedure. However, as with any procedure, complications can occur. Possible complications include:  °· Breathing problems related to the anesthetic used. °· Cardiac arrest This risk is rare. °· A blood clot that breaks free and travels to other parts of your body. This could cause a stroke or other problems. The risk of this is lowered by use of blood thinning medicine (anticoagulant) prior to the procedure. °BEFORE THE PROCEDURE  °· You may have tests to detect blood clots in your heart and evaluate heart  function.  °· You may start taking anticoagulants so your blood does not clot as easily.   °· Medicines may be given to help stabilize your heart rate and rhythm. °PROCEDURE °· You will be given medicine through an IV tube to reduce discomfort and make you sleepy (sedative).   °· An electrical shock will be delivered. °AFTER THE PROCEDURE °Your heart rhythm will be watched to make sure it does not change. You may be able to go home within a few hours.  °Document Released: 03/25/2002 Document Revised: 01/23/2013 Document Reviewed: 10/17/2012 °[employer]® Patient Information ©2014 [employer]. ° °

## 2013-09-13 NOTE — Anesthesia Postprocedure Evaluation (Signed)
  Anesthesia Post-op Note  Patient: Levi Lewis  Procedure(s) Performed: Procedure(s): CARDIOVERSION (N/A)  Patient Location: Endoscopy Unit  Anesthesia Type:General  Level of Consciousness: awake, alert  and oriented  Airway and Oxygen Therapy: Patient Spontanous Breathing  Post-op Pain: none  Post-op Assessment: Stable  Post-op Vital Signs: stable  Last Vitals:  Filed Vitals:   09/13/13 1510  BP: 142/67  Pulse: 30  Temp:   Resp: 24    Complications: No apparent anesthesia complications

## 2013-09-14 ENCOUNTER — Telehealth: Payer: Self-pay | Admitting: Physician Assistant

## 2013-09-14 ENCOUNTER — Encounter: Payer: Self-pay | Admitting: Cardiology

## 2013-09-14 NOTE — Telephone Encounter (Signed)
   I spoke with his daughter who reported the patient feeling lightheaded and dizzy this morning. He had a cardioversion yesterday and had some adjustments done on his pacemaker. His HR has fluctuated between 60-110 bmp. Lowest SBP 103/63. He is feeling better currently. I advised her to bring him to the ED if he gets worse or has any SOB, CP or feels like he may pass out.   Perry Mount PA-C  [facility]

## 2013-09-16 ENCOUNTER — Encounter (HOSPITAL_COMMUNITY): Payer: Self-pay | Admitting: Cardiology

## 2013-09-16 ENCOUNTER — Encounter: Payer: Self-pay | Admitting: Cardiology

## 2013-09-18 ENCOUNTER — Telehealth: Payer: Self-pay | Admitting: Cardiology

## 2013-09-18 ENCOUNTER — Encounter: Payer: Self-pay | Admitting: Cardiology

## 2013-09-18 NOTE — Telephone Encounter (Signed)
**Note De-Identified  Obfuscation** Levi Lewis is advised, he verbalized understanding and agrees with plan. The pt is scheduled to come to office tomorrow morning at 10 am for an EKG. Levi Lewis is aware of date and time to be here.

## 2013-09-18 NOTE — Telephone Encounter (Signed)
New Message:  Pt's son is calling to speak with Jeani Hawking.. States he will give more details when [place] calls.

## 2013-09-18 NOTE — Progress Notes (Unsigned)
Patient ID: Levi Lewis, male   DOB: 10-13-1917, 80 y.o.   MRN: [id number]  Patient was recently cardioverted. It seems that he probably did feel better. He then thinks he went back into his atrial fib and he feels worse. I decided to try amiodarone. First we will have to document his rhythm by EKG. If he is in atrial fib, amiodarone will be started as an outpatient.  Daryel November, MD

## 2013-09-18 NOTE — Telephone Encounter (Signed)
Please arrange for the patient to come in for an EKG tomorrow. If he is reverted to atrial fib, start him on amiodarone 200 mg twice a day.

## 2013-09-18 NOTE — Telephone Encounter (Signed)
The pts son, Levi Lewis, states that the pt c/o weakness, fatigue and states that he is feeling more irregular heart beats. He states that the pts BP is 130/70 and his HR= 80. Levi Lewis states that the pt is concerned and wants to know what to do. Please advise.

## 2013-09-19 ENCOUNTER — Encounter: Payer: Medicare Other | Admitting: *Deleted

## 2013-09-19 ENCOUNTER — Ambulatory Visit (INDEPENDENT_AMBULATORY_CARE_PROVIDER_SITE_OTHER): Payer: Medicare Other | Admitting: Cardiovascular Disease

## 2013-09-19 VITALS — BP 111/67 | HR 79 | Wt 179.0 lb

## 2013-09-19 DIAGNOSIS — I4891 Unspecified atrial fibrillation: Secondary | ICD-10-CM

## 2013-09-19 LAB — MDC_IDC_ENUM_SESS_TYPE_INCLINIC
Implantable Pulse Generator Model: 3242
Implantable Pulse Generator Serial Number: 2978403

## 2013-09-19 MED ORDER — AMIODARONE HCL 200 MG PO TABS: 200.0000 mg | ORAL_TABLET | Freq: Two times a day (BID) | ORAL | Status: DC

## 2013-09-19 NOTE — Progress Notes (Signed)
History of Present Illness: 80 yo male with history of atrial fib, CAD, HTN, HLD, cardiomyopathy who is followed by DR. Ron Parker and is here today for an EKG check. He is added onto my schedule as the office doctor of the day for evaluation. Recent cardioversion. He feels that he is back in atrial fib. Feels his heart racing. Some dyspnea. EKG this am with V-pacing. The device clinic kindly saw him as well and interrogated his device. He is back in atrial fib. No other complaints.   Primary Care Physician: [name]  Past Medical History  Diagnosis Date  . Left bundle branch block   . CAD (coronary artery disease)   . Hypertension   . Gout   . Dyslipidemia   . Lung nodules   . COPD (chronic obstructive pulmonary disease)   . Mitral regurgitation   . Aortic insufficiency   . Shoulder pain   . Back pain     Back and knee pain, June, 2014  . Cardiomyopathy, EF by echo 25-30% 07/14/2013  . RBBB   . Atrial fibrillation     Past Surgical History  Procedure Laterality Date  . Back surgery    . Right shoulder    . Right knee surgery    . Tonsillectomy    . Coronary angioplasty    . Bi-ventricular pacemaker insertion (crt-p)  07-15-2013    STJ Quadra Allura CRTP implanted by Dr Caryl Comes for NICM, CHF, alternating bundle branch blocks  . Cardioversion N/A 09/13/2013    Procedure: CARDIOVERSION;  Surgeon: Carlena Bjornstad, MD;  Location: [facility] ENDOSCOPY;  Service: Cardiovascular;  Laterality: N/A;    Current Outpatient Prescriptions  Medication Sig Dispense Refill  . amiodarone (PACERONE) 200 MG tablet Take 1 tablet (200 mg total) by mouth 2 (two) times daily.  60 tablet  3  . apixaban (ELIQUIS) 2.5 MG TABS tablet Take 1 tablet (2.5 mg total) by mouth 2 (two) times daily.  60 tablet  6  . carvedilol (COREG) 6.25 MG tablet Take 1 tablet (6.25 mg total) by mouth 2 (two) times daily with a meal.  60 tablet  6  . fenofibrate (TRICOR) 48 MG tablet Take 0.5 tablets (24 mg total) by mouth daily.   45 tablet  3  . fish oil-omega-3 fatty acids 1000 MG capsule Take 2 g by mouth daily.       . furosemide (LASIX) 40 MG tablet Take 1 tablet (40 mg total) by mouth 2 (two) times daily.  180 tablet  1  . Multiple Vitamins-Minerals (OCUVITE PO) Take 2 tablets by mouth daily.       Marland Kitchen omeprazole (PRILOSEC) 20 MG capsule Take 20 mg by mouth daily.       . tamsulosin (FLOMAX) 0.4 MG CAPS capsule Take 0.4 mg by mouth 2 (two) times daily.       . triazolam (HALCION) 0.25 MG tablet Take 0.25 mg by mouth at bedtime as needed for sleep.        No current facility-administered medications for this visit.    No Known Allergies  History   Social History  . Marital Status: Married    Spouse Name: N/A    Number of Children: N/A  . Years of Education: N/A   Occupational History  . Not on file.   Social History Main Topics  . Smoking status: Never Smoker   . Smokeless tobacco: Not on file  . Alcohol Use: No  . Drug Use: No  .  Sexual Activity: Not Currently   Other Topics Concern  . Not on file   Social History Narrative   He has never smoked or drank. No illcit drug use . He is retired from the [employer] and lives with his wife. Illicit Drug Use- no    Family History  Problem Relation Age of Onset  . Heart attack Sister   . Lung cancer Brother     smoker  . Colon cancer Neg Hx     Review of Systems:  As stated in the HPI and otherwise negative.   BP 111/67  Pulse 79  Wt 179 lb (81.194 kg)  Physical Examination: General: Well developed, well nourished, NAD HEENT: OP clear, mucus membranes moist SKIN: warm, dry. No rashes. Neuro: No focal deficits Musculoskeletal: Muscle strength 5/5 all ext Psychiatric: Mood and affect normal Neck: No JVD, no carotid bruits, no thyromegaly, no lymphadenopathy. Lungs:Clear bilaterally, no wheezes, rhonci, crackles Cardiovascular: Regular rate and rhythm. No murmurs, gallops or rubs. Abdomen:Soft. Bowel sounds present. Non-tender.    Extremities: No lower extremity edema. Pulses are 2 + in the bilateral DP/PT.  EKG: V-paced  Assessment and Plan:   1. Recurrent atrial fib: He is on Eliquis for anti-coagulation. Will start amiodarone 200 mg po BID per plans of Dr. Ron Parker as outlined in phone note last night. Reviewed side effects of medication with pt and son. Keep f/u in one week with Dr. Ron Parker.

## 2013-09-19 NOTE — Progress Notes (Signed)
Patient in for EKG, HR check. Seen by Device Clinic to determine underlying rhythm of EKG, which is paced. Patient is currently in A Fib. He is experiencing increased fatigue with mild to moderate SOB with exertion. He states he does have occasional mild headache but not currently. Patient seen by Dr. Angelena Form in appointment.

## 2013-09-19 NOTE — Patient Instructions (Signed)
Keep scheduled appt with Dr. Ron Parker for September 27, 2013  Your physician has recommended you make the following change in your medication:  Start amiodarone 200 mg by mouth twice daily

## 2013-09-25 ENCOUNTER — Encounter: Payer: Self-pay | Admitting: Cardiology

## 2013-09-25 ENCOUNTER — Ambulatory Visit (INDEPENDENT_AMBULATORY_CARE_PROVIDER_SITE_OTHER): Payer: Medicare Other | Admitting: Cardiology

## 2013-09-25 ENCOUNTER — Ambulatory Visit (INDEPENDENT_AMBULATORY_CARE_PROVIDER_SITE_OTHER): Payer: Medicare Other | Admitting: *Deleted

## 2013-09-25 VITALS — BP 111/54 | HR 87 | Ht 71.0 in | Wt 182.0 lb

## 2013-09-25 DIAGNOSIS — Z7901 Long term (current) use of anticoagulants: Secondary | ICD-10-CM

## 2013-09-25 DIAGNOSIS — Z9229 Personal history of other drug therapy: Secondary | ICD-10-CM

## 2013-09-25 DIAGNOSIS — I509 Heart failure, unspecified: Secondary | ICD-10-CM

## 2013-09-25 DIAGNOSIS — I447 Left bundle-branch block, unspecified: Secondary | ICD-10-CM

## 2013-09-25 DIAGNOSIS — I359 Nonrheumatic aortic valve disorder, unspecified: Secondary | ICD-10-CM

## 2013-09-25 DIAGNOSIS — Z950 Presence of cardiac pacemaker: Secondary | ICD-10-CM

## 2013-09-25 DIAGNOSIS — I351 Nonrheumatic aortic (valve) insufficiency: Secondary | ICD-10-CM

## 2013-09-25 DIAGNOSIS — R9430 Abnormal result of cardiovascular function study, unspecified: Secondary | ICD-10-CM

## 2013-09-25 DIAGNOSIS — R0989 Other specified symptoms and signs involving the circulatory and respiratory systems: Secondary | ICD-10-CM

## 2013-09-25 DIAGNOSIS — I428 Other cardiomyopathies: Secondary | ICD-10-CM

## 2013-09-25 DIAGNOSIS — I429 Cardiomyopathy, unspecified: Secondary | ICD-10-CM

## 2013-09-25 DIAGNOSIS — I2510 Atherosclerotic heart disease of native coronary artery without angina pectoris: Secondary | ICD-10-CM

## 2013-09-25 DIAGNOSIS — I4891 Unspecified atrial fibrillation: Secondary | ICD-10-CM

## 2013-09-25 DIAGNOSIS — I5022 Chronic systolic (congestive) heart failure: Secondary | ICD-10-CM

## 2013-09-25 DIAGNOSIS — I4819 Other persistent atrial fibrillation: Secondary | ICD-10-CM

## 2013-09-25 DIAGNOSIS — I10 Essential (primary) hypertension: Secondary | ICD-10-CM

## 2013-09-25 NOTE — Patient Instructions (Addendum)
**Note De-Identified  Obfuscation** Your physician has recommended you make the following change in your medication: take 80 mg of Lasix (Furosemide) tonight then resume normal dose of 40 mg twice daily. No change in your other medications at this time.  Your physician recommends that you schedule a follow-up appointment in: 8 weeks

## 2013-09-25 NOTE — Progress Notes (Signed)
Patient ID: Levi Lewis, male   DOB: 1917/12/01, 80 y.o.   MRN: [id number]    HPI  The patient is seen today to followup his atrial fibrillation. When I saw him last week decided to proceed with cardioversion. This was done successfully as an outpatient. He converted rapidly with one shock. He went home in sinus rhythm. However within a few days he called to say that he was not feeling as well. Clinically we suspected it is gone back in atrial fibrillation. Because he was feeling poorly with this, I decided to add oral amiodarone as an outpatient. He is now back in the office in followup. He is relatively stable. He mentions that he has slight blurring of his vision. He also mentions that he gains 2 pounds today and has some mild increase in shortness of breath.  No Known Allergies  Current Outpatient Prescriptions  Medication Sig Dispense Refill  . amiodarone (PACERONE) 200 MG tablet Take 1 tablet (200 mg total) by mouth 2 (two) times daily.  60 tablet  3  . apixaban (ELIQUIS) 2.5 MG TABS tablet Take 1 tablet (2.5 mg total) by mouth 2 (two) times daily.  60 tablet  6  . carvedilol (COREG) 6.25 MG tablet Take 1 tablet (6.25 mg total) by mouth 2 (two) times daily with a meal.  60 tablet  6  . fenofibrate (TRICOR) 48 MG tablet Take 0.5 tablets (24 mg total) by mouth daily.  45 tablet  3  . fish oil-omega-3 fatty acids 1000 MG capsule Take 2 g by mouth daily.       . furosemide (LASIX) 40 MG tablet Take 1 tablet (40 mg total) by mouth 2 (two) times daily.  180 tablet  1  . Multiple Vitamins-Minerals (OCUVITE PO) Take 2 tablets by mouth daily.       Marland Kitchen omeprazole (PRILOSEC) 20 MG capsule Take 20 mg by mouth daily.       . tamsulosin (FLOMAX) 0.4 MG CAPS capsule Take 0.4 mg by mouth 2 (two) times daily.       . triazolam (HALCION) 0.25 MG tablet Take 0.25 mg by mouth at bedtime as needed for sleep.        No current facility-administered medications for this visit.    History   Social History    . Marital Status: Married    Spouse Name: N/A    Number of Children: N/A  . Years of Education: N/A   Occupational History  . Not on file.   Social History Main Topics  . Smoking status: Never Smoker   . Smokeless tobacco: Not on file  . Alcohol Use: No  . Drug Use: No  . Sexual Activity: Not Currently   Other Topics Concern  . Not on file   Social History Narrative   He has never smoked or drank. No illcit drug use . He is retired from the [employer] and lives with his wife. Illicit Drug Use- no    Family History  Problem Relation Age of Onset  . Heart attack Sister   . Lung cancer Brother     smoker  . Colon cancer Neg Hx     Past Medical History  Diagnosis Date  . Left bundle branch block   . CAD (coronary artery disease)   . Hypertension   . Gout   . Dyslipidemia   . Lung nodules   . COPD (chronic obstructive pulmonary disease)   . Mitral regurgitation   . Aortic  insufficiency   . Shoulder pain   . Back pain     Back and knee pain, June, 2014  . Cardiomyopathy, EF by echo 25-30% 07/14/2013  . RBBB   . Atrial fibrillation     Past Surgical History  Procedure Laterality Date  . Back surgery    . Right shoulder    . Right knee surgery    . Tonsillectomy    . Coronary angioplasty    . Bi-ventricular pacemaker insertion (crt-p)  07-15-2013    STJ Quadra Allura CRTP implanted by Dr Caryl Comes for NICM, CHF, alternating bundle branch blocks  . Cardioversion N/A 09/13/2013    Procedure: CARDIOVERSION;  Surgeon: Carlena Bjornstad, MD;  Location: [facility] ENDOSCOPY;  Service: Cardiovascular;  Laterality: N/A;    Patient Active Problem List   Diagnosis Date Noted  . Multiple blisters 09/03/2013  . Thyroid condition 08/05/2013  . Pacemaker 08/04/2013  . Chronic systolic CHF (congestive heart failure) 08/04/2013  . HX: anticoagulation 08/04/2013  . CKD (chronic kidney disease) stage 4, GFR 15-29 ml/min 07/14/2013  . Cardiomyopathy, EF by echo 25-30% 07/14/2013  .  Atrial fibrillation 07/13/2013  . Alternating (bilateral) bundle branch block 07/13/2013  . Back pain   . Shoulder pain   . Left bundle branch block   . CAD (coronary artery disease)   . Hypertension   . Gout   . Dyslipidemia   . Ejection fraction   . Lung nodules   . COPD (chronic obstructive pulmonary disease)   . Mitral regurgitation   . Aortic insufficiency     ROS   Patient denies fever, chills, headache, sweats, rash, change in hearing, chest pain, cough, nausea vomiting, urinary symptoms. All other systems are reviewed and are negative.  PHYSICAL EXAM  Patient is oriented to person time and place. Affect is normal. He is here with his family member. Head is atraumatic. Sclera and conjunctiva are normal. There is no jugulovenous distention. He does have basilar rales posteriorly. There is no respiratory distress. Cardiac exam reveals S1 and S2. The rhythm is regular because it is paced. The abdomen is soft. There is trace peripheral edema. There no musculoskeletal deformities. There are no skin rashes.  Filed Vitals:   09/25/13 1451  BP: 111/54  Pulse: 87  Height: 5\' 11"  (1.803 m)  Weight: 182 lb (82.555 kg)   EKG was obtained. It showed ventricular pacing. His pacer was then interrogated. It shows underlying atrial fibrillation.  ASSESSMENT & PLAN

## 2013-09-25 NOTE — Assessment & Plan Note (Signed)
The patient has underlying left bundle branch block. This is one reason why he was a candidate for CRT therapy

## 2013-09-25 NOTE — Assessment & Plan Note (Signed)
The patient underwent outpatient cardioversion. Unfortunately he reverted to atrial fib. Considering all factors I feel that it will be very helpful to try to get him back into sinus rhythm. Amiodarone has been started at 200 mg twice a day. I will leave it at this dose until we make a decision about possible second cardioversion. He will be seeing Dr. Caryl Comes in the office on October 15, 2013. Her last Dr. Caryl Comes to help me make a decision at that time if we should proceed with cardioversion.

## 2013-09-25 NOTE — Assessment & Plan Note (Addendum)
The patient may have some mild increase in volume today. I've increase his dose of diuretic for this afternoon. We will be in touch with him tomorrow about his weights. I think we will be able to control his volume better if we can get him back into sinus rhythm.  As part of today's evaluation I spent greater than 25 minutes at the patient's total care. I had an extensive discussion with him and his family about the ideas of continuing amiodarone and considering repeat cardioversion.

## 2013-09-25 NOTE — Assessment & Plan Note (Signed)
Anticoagulation discontinued.

## 2013-09-25 NOTE — Assessment & Plan Note (Signed)
His most recent ejection fraction was significantly reduced. It has not been repeated since his CRT pacer was put in place.

## 2013-09-25 NOTE — Assessment & Plan Note (Signed)
There is valvular disease that also plays a role with his symptomatology.

## 2013-09-25 NOTE — Assessment & Plan Note (Signed)
It is presumed that he has coronary disease. Cardiac catheterization has not been done.

## 2013-09-25 NOTE — Assessment & Plan Note (Signed)
I do not know how much of the time the patient can pace in a biventricular matter with his current rhythm. I'm hoping this can be improved if we can return him to sinus rhythm.

## 2013-09-26 NOTE — Telephone Encounter (Signed)
Pts weight today is 6-11=176.8lbs.  Pts weight on 6-10= 177.2lbs Pts weight ob 6-9= 175.8lbs Advised pt that I will further notify Dr Ron Parker of this and if Dr Ron Parker has any other recommendations, I will call him.  Pt verbalized understanding and agrees with this plan.

## 2013-09-26 NOTE — Telephone Encounter (Signed)
Message copied by Nuala Alpha on Thu Sep 26, 2013  9:17 AM ------      Message from: VIA, PATRICIA M      Created: Wed Sep 25, 2013  4:00 PM      Regarding: Pts weight      Contact: [phone]       Darol Destine not in the office tomorrow (6/11) and Dr Ron Parker wants a nurse to call this pt in the morning at [phone] to get his weight then fax the weight to Dr Ron Parker at [phone]. Since you are in triage tomorrow Im hoping you can help me out.            This is because the pt saw Dr Ron Parker today and told him that he gained 2 lbs over night. Dr Ron Parker advised him to take 80 mg of lasix tonight and then to resume his normal dose of 40 mg BID.            Thank you sooooo much!!! Jeani Hawking       ------

## 2013-09-27 ENCOUNTER — Ambulatory Visit: Payer: Medicare Other | Admitting: Cardiology

## 2013-09-29 ENCOUNTER — Encounter: Payer: Self-pay | Admitting: Internal Medicine

## 2013-10-11 LAB — MDC_IDC_ENUM_SESS_TYPE_INCLINIC
Battery Remaining Percentage: 99 %
Battery Voltage: 2.99 V
Lead Channel Impedance Value: 490 Ohm
Lead Channel Impedance Value: 560 Ohm
MDC IDC MSMT LEADCHNL LV IMPEDANCE VALUE: 740 Ohm
MDC IDC PG MODEL: 3242
MDC IDC PG SERIAL: 2978403
MDC IDC STAT BRADY RA PERCENT PACED: 11 %
MDC IDC STAT BRADY RV PERCENT PACED: 84 %

## 2013-10-11 NOTE — Addendum Note (Signed)
Addended by: Shiela Mayer on: 10/11/2013 02:46 PM   Modules accepted: Level of Service

## 2013-10-11 NOTE — Progress Notes (Signed)
CRT-P device check with Dr.Katz for underlying rhythm only (N/C). 527 AMS episodes (67%)---max dur. 1 hour 15 mins, Max A 334, Max V 90 + Eliquis. Testing was not performed this office visit. Patient will follow up with SK on 6-30 as scheduled.

## 2013-10-15 ENCOUNTER — Encounter: Payer: Self-pay | Admitting: Internal Medicine

## 2013-10-15 ENCOUNTER — Encounter: Payer: Self-pay | Admitting: *Deleted

## 2013-10-15 ENCOUNTER — Ambulatory Visit (INDEPENDENT_AMBULATORY_CARE_PROVIDER_SITE_OTHER): Payer: Medicare Other | Admitting: Internal Medicine

## 2013-10-15 VITALS — BP 127/68 | HR 80 | Ht 71.0 in | Wt 183.0 lb

## 2013-10-15 DIAGNOSIS — I4891 Unspecified atrial fibrillation: Secondary | ICD-10-CM

## 2013-10-15 DIAGNOSIS — I2510 Atherosclerotic heart disease of native coronary artery without angina pectoris: Secondary | ICD-10-CM

## 2013-10-15 DIAGNOSIS — Z01812 Encounter for preprocedural laboratory examination: Secondary | ICD-10-CM

## 2013-10-15 DIAGNOSIS — Z950 Presence of cardiac pacemaker: Secondary | ICD-10-CM

## 2013-10-15 DIAGNOSIS — I495 Sick sinus syndrome: Secondary | ICD-10-CM

## 2013-10-15 DIAGNOSIS — R5383 Other fatigue: Secondary | ICD-10-CM | POA: Insufficient documentation

## 2013-10-15 DIAGNOSIS — I4819 Other persistent atrial fibrillation: Secondary | ICD-10-CM

## 2013-10-15 LAB — CBC WITH DIFFERENTIAL/PLATELET
Basophils Absolute: 0 10*3/uL (ref 0.0–0.1)
Basophils Relative: 0.5 % (ref 0.0–3.0)
Eosinophils Absolute: 0.2 10*3/uL (ref 0.0–0.7)
Eosinophils Relative: 2.6 % (ref 0.0–5.0)
HCT: 37.5 % — ABNORMAL LOW (ref 39.0–52.0)
Hemoglobin: 12.7 g/dL — ABNORMAL LOW (ref 13.0–17.0)
Lymphocytes Relative: 31 % (ref 12.0–46.0)
Lymphs Abs: 2.4 10*3/uL (ref 0.7–4.0)
MCHC: 33.9 g/dL (ref 30.0–36.0)
MCV: 92.2 fl (ref 78.0–100.0)
Monocytes Absolute: 0.9 10*3/uL (ref 0.1–1.0)
Monocytes Relative: 11.2 % (ref 3.0–12.0)
Neutro Abs: 4.2 10*3/uL (ref 1.4–7.7)
Neutrophils Relative %: 54.7 % (ref 43.0–77.0)
Platelets: 285 10*3/uL (ref 150.0–400.0)
RBC: 4.07 Mil/uL — ABNORMAL LOW (ref 4.22–5.81)
RDW: 13.7 % (ref 11.5–15.5)
WBC: 7.7 10*3/uL (ref 4.0–10.5)

## 2013-10-15 LAB — MDC_IDC_ENUM_SESS_TYPE_INCLINIC
Battery Remaining Longevity: 64 mo
Battery Voltage: 2.99 V
Implantable Pulse Generator Model: 3242
Implantable Pulse Generator Serial Number: 2978403
Lead Channel Impedance Value: 550 Ohm
Lead Channel Pacing Threshold Amplitude: 1.25 V
Lead Channel Pacing Threshold Pulse Width: 0.4 ms
Lead Channel Sensing Intrinsic Amplitude: 0.2 mV
Lead Channel Sensing Intrinsic Amplitude: 12 mV
Lead Channel Setting Pacing Pulse Width: 0.4 ms
Lead Channel Setting Pacing Pulse Width: 0.4 ms
MDC IDC MSMT LEADCHNL LV IMPEDANCE VALUE: 690 Ohm
MDC IDC MSMT LEADCHNL LV PACING THRESHOLD PULSEWIDTH: 0.4 ms
MDC IDC MSMT LEADCHNL RA IMPEDANCE VALUE: 490 Ohm
MDC IDC MSMT LEADCHNL RV PACING THRESHOLD AMPLITUDE: 1 V
MDC IDC SET LEADCHNL LV PACING AMPLITUDE: 2.5 V
MDC IDC SET LEADCHNL RV PACING AMPLITUDE: 2.5 V
MDC IDC SET LEADCHNL RV SENSING SENSITIVITY: 2 mV
MDC IDC STAT BRADY RV PERCENT PACED: 96 %

## 2013-10-15 LAB — BASIC METABOLIC PANEL WITH GFR
BUN: 29 mg/dL — ABNORMAL HIGH (ref 6–23)
CO2: 25 meq/L (ref 19–32)
Calcium: 8.7 mg/dL (ref 8.4–10.5)
Chloride: 108 meq/L (ref 96–112)
Creatinine, Ser: 1.9 mg/dL — ABNORMAL HIGH (ref 0.4–1.5)
GFR: 35.44 mL/min — ABNORMAL LOW
Glucose, Bld: 114 mg/dL — ABNORMAL HIGH (ref 70–99)
Potassium: 3.4 meq/L — ABNORMAL LOW (ref 3.5–5.1)
Sodium: 140 meq/L (ref 135–145)

## 2013-10-15 LAB — HEPATIC FUNCTION PANEL
ALT: 13 U/L (ref 0–53)
AST: 20 U/L (ref 0–37)
Albumin: 3.7 g/dL (ref 3.5–5.2)
Alkaline Phosphatase: 52 U/L (ref 39–117)
Bilirubin, Direct: 0.2 mg/dL (ref 0.0–0.3)
Total Bilirubin: 0.7 mg/dL (ref 0.2–1.2)
Total Protein: 7 g/dL (ref 6.0–8.3)

## 2013-10-15 LAB — TSH: TSH: 6.78 u[IU]/mL — ABNORMAL HIGH (ref 0.35–4.50)

## 2013-10-15 NOTE — Progress Notes (Signed)
Patient Care Team: Criselda Peaches, MD as PCP - General (Internal Medicine)   HPI  Levi Lewis is a 80 y.o. male Seen in followup for CRT P. implantation 3/15.this occurred in the context of atrial fibrillation and alternating bundle branch block.  EF 25-30% 3/15  He is not sure if he got better following CRT implant; he noted irregular heart beats thereafter   He underwent cardioversion 3 weeks ago fortunately he versus shortly thereafter.   He was started on amiodarone.  He notes fatigue and weakness in legs   At the time of cardioversion, concern was raised regarding atrial lead function   Past Medical History  Diagnosis Date  . Left bundle branch block   . CAD (coronary artery disease)   . Hypertension   . Gout   . Dyslipidemia   . Lung nodules   . COPD (chronic obstructive pulmonary disease)   . Mitral regurgitation   . Aortic insufficiency   . Shoulder pain   . Back pain     Back and knee pain, June, 2014  . Cardiomyopathy, EF by echo 25-30% 07/14/2013  . RBBB   . Atrial fibrillation     Past Surgical History  Procedure Laterality Date  . Back surgery    . Right shoulder    . Right knee surgery    . Tonsillectomy    . Coronary angioplasty    . Bi-ventricular pacemaker insertion (crt-p)  07-15-2013    STJ Quadra Allura CRTP implanted by Dr Caryl Comes for NICM, CHF, alternating bundle branch blocks  . Cardioversion N/A 09/13/2013    Procedure: CARDIOVERSION;  Surgeon: Carlena Bjornstad, MD;  Location: [facility] ENDOSCOPY;  Service: Cardiovascular;  Laterality: N/A;    Current Outpatient Prescriptions  Medication Sig Dispense Refill  . amiodarone (PACERONE) 200 MG tablet Take 1 tablet (200 mg total) by mouth 2 (two) times daily.  60 tablet  3  . apixaban (ELIQUIS) 2.5 MG TABS tablet Take 1 tablet (2.5 mg total) by mouth 2 (two) times daily.  60 tablet  6  . carvedilol (COREG) 6.25 MG tablet Take 1 tablet (6.25 mg total) by mouth 2 (two) times daily with a meal.  60  tablet  6  . fenofibrate (TRICOR) 48 MG tablet Take 0.5 tablets (24 mg total) by mouth daily.  45 tablet  3  . fish oil-omega-3 fatty acids 1000 MG capsule Take 2 g by mouth daily.       . furosemide (LASIX) 40 MG tablet Take 1 tablet (40 mg total) by mouth 2 (two) times daily.  180 tablet  1  . Multiple Vitamins-Minerals (OCUVITE PO) Take 2 tablets by mouth daily.       Marland Kitchen omeprazole (PRILOSEC) 20 MG capsule Take 20 mg by mouth daily.       . tamsulosin (FLOMAX) 0.4 MG CAPS capsule Take 0.4 mg by mouth 2 (two) times daily.       . triazolam (HALCION) 0.25 MG tablet Take 0.25 mg by mouth at bedtime as needed for sleep.        No current facility-administered medications for this visit.    No Known Allergies  Review of Systems negative except from HPI and PMH  Physical Exam BP 127/68  Pulse 80  Ht 5\' 11"  (1.803 m)  Wt 183 lb (83.008 kg)  BMI 25.53 kg/m2 Well developed and well nourished in no acute distress HENT normal E scleral and icterus clear Neck Supple JVP flat;  carotids brisk and full Clear to ausculation  Regular rate and rhythm, no murmurs gallops or rub Soft with active bowel sounds No clubbing cyanosis  Edema Alert and oriented, grossly normal motor and sensory function Skin Warm and Dry  ECG demonstrates atrial fibrillation with biventricular pacing  Assessment and  Plan  Atrial fib/flutter  Atrial lead failure  Alternating bundle branch block  Fatigue/leg weakness  CRT-P  St Jude   We will plan to undertake cardioversion   at that juncture we will need to understand whether the fatigue is related to the atrial arrhythmia, medication or something altogether different.  Following cardioversion then, if the symptoms persist we will stop the amiodarone.  We'll also need that juncture to understand with one of the atrial lead. We anticipate a chest x-ray at the time of cardioversion.

## 2013-10-15 NOTE — Patient Instructions (Signed)
Your physician recommends that you continue on your current medications as directed. Please refer to the Current Medication list given to you today.  Labs today: BMET, CBCD, TSH, LFT  A chest x-ray takes a picture of the organs and structures inside the chest, including the heart, lungs, and blood vessels. This test can show several things, including, whether the heart is enlarges; whether fluid is building up in the lungs; and whether pacemaker / defibrillator leads are still in place. -- HAVE THIS DONE IN HOSPITAL SAME DAY AS YOUR CARDIOVERSION.  Your physician has recommended that you have a Cardioversion (DCCV). Electrical Cardioversion uses a jolt of electricity to your heart either through paddles or wired patches attached to your chest. This is a controlled, usually prescheduled, procedure. Defibrillation is done under light anesthesia in the hospital, and you usually go home the day of the procedure. This is done to get your heart back into a normal rhythm. You are not awake for the procedure. Please see the instruction sheet given to you today.

## 2013-10-16 ENCOUNTER — Encounter (HOSPITAL_COMMUNITY): Payer: Self-pay | Admitting: Pharmacy Technician

## 2013-10-16 ENCOUNTER — Encounter: Payer: Self-pay | Admitting: Internal Medicine

## 2013-10-17 ENCOUNTER — Ambulatory Visit (HOSPITAL_COMMUNITY): Payer: Medicare Other | Admitting: Anesthesiology

## 2013-10-17 ENCOUNTER — Encounter (HOSPITAL_COMMUNITY): Payer: Medicare Other | Admitting: Anesthesiology

## 2013-10-17 ENCOUNTER — Ambulatory Visit (HOSPITAL_COMMUNITY): Payer: Medicare Other

## 2013-10-17 ENCOUNTER — Ambulatory Visit (HOSPITAL_COMMUNITY)
Admission: RE | Admit: 2013-10-17 | Discharge: 2013-10-17 | Disposition: A | Discharge status: Home / Self Care | Payer: Medicare Other | Source: Ambulatory Visit | Attending: Cardiology | Admitting: Cardiology

## 2013-10-17 ENCOUNTER — Encounter (HOSPITAL_COMMUNITY): Payer: Self-pay | Admitting: Anesthesiology

## 2013-10-17 ENCOUNTER — Encounter (HOSPITAL_COMMUNITY)
Admission: RE | Disposition: A | Discharge status: Home / Self Care | Payer: Self-pay | Source: Ambulatory Visit | Attending: Cardiology

## 2013-10-17 DIAGNOSIS — M109 Gout, unspecified: Secondary | ICD-10-CM

## 2013-10-17 DIAGNOSIS — I4891 Unspecified atrial fibrillation: Secondary | ICD-10-CM | POA: Insufficient documentation

## 2013-10-17 DIAGNOSIS — I452 Bifascicular block: Secondary | ICD-10-CM

## 2013-10-17 DIAGNOSIS — J4489 Other specified chronic obstructive pulmonary disease: Secondary | ICD-10-CM | POA: Insufficient documentation

## 2013-10-17 DIAGNOSIS — Z9861 Coronary angioplasty status: Secondary | ICD-10-CM

## 2013-10-17 DIAGNOSIS — J449 Chronic obstructive pulmonary disease, unspecified: Secondary | ICD-10-CM

## 2013-10-17 DIAGNOSIS — R918 Other nonspecific abnormal finding of lung field: Secondary | ICD-10-CM | POA: Insufficient documentation

## 2013-10-17 DIAGNOSIS — Z79899 Other long term (current) drug therapy: Secondary | ICD-10-CM

## 2013-10-17 DIAGNOSIS — N289 Disorder of kidney and ureter, unspecified: Secondary | ICD-10-CM

## 2013-10-17 DIAGNOSIS — I080 Rheumatic disorders of both mitral and aortic valves: Secondary | ICD-10-CM

## 2013-10-17 DIAGNOSIS — I428 Other cardiomyopathies: Secondary | ICD-10-CM

## 2013-10-17 DIAGNOSIS — A419 Sepsis, unspecified organism: Secondary | ICD-10-CM | POA: Diagnosis not present

## 2013-10-17 DIAGNOSIS — Z01812 Encounter for preprocedural laboratory examination: Secondary | ICD-10-CM

## 2013-10-17 DIAGNOSIS — E785 Hyperlipidemia, unspecified: Secondary | ICD-10-CM | POA: Insufficient documentation

## 2013-10-17 DIAGNOSIS — Z950 Presence of cardiac pacemaker: Secondary | ICD-10-CM | POA: Insufficient documentation

## 2013-10-17 DIAGNOSIS — I509 Heart failure, unspecified: Secondary | ICD-10-CM

## 2013-10-17 DIAGNOSIS — I2510 Atherosclerotic heart disease of native coronary artery without angina pectoris: Secondary | ICD-10-CM

## 2013-10-17 DIAGNOSIS — I4819 Other persistent atrial fibrillation: Secondary | ICD-10-CM

## 2013-10-17 DIAGNOSIS — Z7901 Long term (current) use of anticoagulants: Secondary | ICD-10-CM

## 2013-10-17 DIAGNOSIS — R0602 Shortness of breath: Secondary | ICD-10-CM | POA: Diagnosis not present

## 2013-10-17 DIAGNOSIS — I10 Essential (primary) hypertension: Secondary | ICD-10-CM | POA: Insufficient documentation

## 2013-10-17 DIAGNOSIS — I495 Sick sinus syndrome: Secondary | ICD-10-CM

## 2013-10-17 HISTORY — DX: Unspecified osteoarthritis, unspecified site: M19.90

## 2013-10-17 HISTORY — PX: CARDIOVERSION: SHX1299

## 2013-10-17 HISTORY — DX: Presence of cardiac pacemaker: Z95.0

## 2013-10-17 HISTORY — DX: Pneumonia, unspecified organism: J18.9

## 2013-10-17 SURGERY — CARDIOVERSION
Anesthesia: Monitor Anesthesia Care

## 2013-10-17 MED ORDER — LIDOCAINE HCL (CARDIAC) 20 MG/ML IV SOLN
INTRAVENOUS | Status: DC | PRN
  Administered 2013-10-17: 40 mg via INTRAVENOUS

## 2013-10-17 MED ORDER — PROPOFOL 10 MG/ML IV BOLUS
INTRAVENOUS | Status: DC | PRN
  Administered 2013-10-17: 40 mg via INTRAVENOUS

## 2013-10-17 MED ORDER — SODIUM CHLORIDE 0.9 % IV SOLN
INTRAVENOUS | Status: DC
  Administered 2013-10-17: 11:00:00 via INTRAVENOUS

## 2013-10-17 NOTE — Discharge Instructions (Signed)
Electrical Cardioversion, Care After °Refer to this sheet in the next few weeks. These instructions provide you with information on caring for yourself after your procedure. Your health care provider may also give you more specific instructions. Your treatment has been planned according to current medical practices, but problems sometimes occur. Call your health care provider if you have any problems or questions after your procedure. °WHAT TO EXPECT AFTER THE PROCEDURE °After your procedure, it is typical to have the following sensations: °· Some redness on the skin where the shocks were delivered. If this is tender, a sunburn lotion or hydrocortisone cream may help. °· Possible return of an abnormal heart rhythm within hours or days after the procedure. °HOME CARE INSTRUCTIONS °· Only take medicine as directed by your health care provider. Be sure you understand how and when to take your medicine. °· Learn how to feel your pulse and check it often. °· Limit your activity for 48 hours after the procedure or as directed. °· Avoid or minimize caffeine and other stimulants as directed. °SEEK MEDICAL CARE IF: °· You feel like your heart is beating too fast or your pulse is not regular. °· You have any questions about your medicines. °· You have bleeding that will not stop. °SEEK IMMEDIATE MEDICAL CARE IF: °· You are dizzy or feel faint. °· It is hard to breathe or you feel short of breath. °· There is a change in discomfort in your chest. °· Your speech is slurred or you have trouble moving an arm or leg on one side of your body. °· You get a serious muscle cramp that does not go away. °· Your fingers or toes turn cold or blue. °MAKE SURE YOU:  °· Understand these instructions.   °· Will watch your condition.   °· Will get help right away if you are not doing well or get worse. °Document Released: 01/23/2013 Document Reviewed: 01/23/2013 °Exit[employer]® Patient Information ©2015 [employer]. This information is not  intended to replace advice given to you by your health care provider. Make sure you discuss any questions you have with your health care provider. ° °

## 2013-10-17 NOTE — Procedures (Signed)
Electrical Cardioversion Procedure Note Levi Lewis [id number] 1918/04/12  Procedure: Electrical Cardioversion Indications:  Atrial Fibrillation  Procedure Details Consent: Risks of procedure as well as the alternatives and risks of each were explained to the (patient/caregiver).  Consent for procedure obtained. Time Out: Verified patient identification, verified procedure, site/side was marked, verified correct patient position, special equipment/implants available, medications/allergies/relevent history reviewed, required imaging and test results available.  Performed  Patient placed on cardiac monitor, pulse oximetry, supplemental oxygen as necessary.  Sedation given: Patient sedated by anesthesia with lidocaine 40 mg and diprovan 40 mg IV. Pacer pads placed anterior and posterior chest.  Cardioverted 1 time(s).  Cardioverted at 120J.  Evaluation Patient AV paced following DCCV; confirmed by interrogating device. Complications: None Patient did tolerate procedure well.   Kirk Ruths 10/17/2013, 8:32 AM

## 2013-10-17 NOTE — Anesthesia Postprocedure Evaluation (Signed)
  Anesthesia Post-op Note  Patient: Levi Lewis  Procedure(s) Performed: Procedure(s): CARDIOVERSION (N/A)  Patient Location: PACU  Anesthesia Type:MAC  Level of Consciousness: awake  Airway and Oxygen Therapy: Patient Spontanous Breathing  Post-op Pain: mild  Post-op Assessment: Post-op Vital signs reviewed  Post-op Vital Signs: Reviewed  Last Vitals:  Filed Vitals:   10/17/13 0927  BP: 134/74  Temp: 36.4 C  Resp: 19    Complications: No apparent anesthesia complications

## 2013-10-17 NOTE — Anesthesia Preprocedure Evaluation (Addendum)
Anesthesia Evaluation  Patient identified by MRN, date of birth, ID band Patient awake    Reviewed: Allergy & Precautions, H&P , NPO status , Patient's Chart, lab work & pertinent test results  History of Anesthesia Complications Negative for: history of anesthetic complications  Airway Mallampati: II TM Distance: >3 FB   Mouth opening: Limited Mouth Opening  Dental  (+) Teeth Intact, Dental Advisory Given   Pulmonary COPD breath sounds clear to auscultation        Cardiovascular hypertension, + CAD and +CHF + dysrhythmias Atrial Fibrillation Rhythm:Regular Rate:Normal  aflutter   Neuro/Psych negative neurological ROS  negative psych ROS   GI/Hepatic   Endo/Other    Renal/GU Renal disease     Musculoskeletal   Abdominal   Peds  Hematology   Anesthesia Other Findings   Reproductive/Obstetrics                          Anesthesia Physical Anesthesia Plan  ASA: III  Anesthesia Plan: MAC   Post-op Pain Management:    Induction: Intravenous  Airway Management Planned: Mask  Additional Equipment:   Intra-op Plan:   Post-operative Plan:   Informed Consent: I have reviewed the patients History and Physical, chart, labs and discussed the procedure including the risks, benefits and alternatives for the proposed anesthesia with the patient or authorized representative who has indicated his/her understanding and acceptance.   Dental advisory given  Plan Discussed with: CRNA and Anesthesiologist  Anesthesia Plan Comments:         Anesthesia Quick Evaluation

## 2013-10-17 NOTE — Transfer of Care (Signed)
Immediate Anesthesia Transfer of Care Note  Patient: Levi Lewis  Procedure(s) Performed: Procedure(s): CARDIOVERSION (N/A)  Patient Location: PACU  Anesthesia Type:General  Level of Consciousness: awake, oriented and patient cooperative  Airway & Oxygen Therapy: Patient Spontanous Breathing and Patient connected to nasal cannula oxygen  Post-op Assessment: Report given to PACU RN and Post -op Vital signs reviewed and stable  Post vital signs: Reviewed  Complications: No apparent anesthesia complications

## 2013-10-17 NOTE — H&P (Signed)
Levi Lewis  10/15/2013 2:30 PM   Office Visit  MRN:  [id number]   Description: 80 year old male  Provider: Deboraha Sprang, MD  Department: [address] [facility]          Vital Signs Most recent update: 10/15/2013  2:38 PM by Roberts Gaudy, CMA      BP Pulse Ht Wt BMI      127/68 80 5\' 11"  (1.803 m) 183 lb (83.008 kg) 25.53 kg/m2             Progress Notes      Deboraha Sprang, MD at 10/15/2013  2:45 PM      Status: Signed                     Patient Care Team: Criselda Peaches, MD as PCP - General (Internal Medicine)     HPI   Levi Lewis is a 80 y.o. male Seen in followup for CRT P. implantation 3/15.this occurred in the context of atrial fibrillation and alternating bundle branch block.  EF 25-30% 3/15   He is not sure if he got better following CRT implant; he noted irregular heart beats thereafter    He underwent cardioversion 3 weeks ago fortunately he versus shortly thereafter.    He was started on amiodarone.   He notes fatigue and weakness in legs    At the time of cardioversion, concern was raised regarding atrial lead function     Past Medical History   Diagnosis  Date   .  Left bundle branch block     .  CAD (coronary artery disease)     .  Hypertension     .  Gout     .  Dyslipidemia     .  Lung nodules     .  COPD (chronic obstructive pulmonary disease)     .  Mitral regurgitation     .  Aortic insufficiency     .  Shoulder pain     .  Back pain         Back and knee pain, June, 2014   .  Cardiomyopathy, EF by echo 25-30%  07/14/2013   .  RBBB     .  Atrial fibrillation           Past Surgical History   Procedure  Laterality  Date   .  Back surgery       .  Right shoulder       .  Right knee surgery       .  Tonsillectomy       .  Coronary angioplasty       .  Bi-ventricular pacemaker insertion (crt-p)    07-15-2013       STJ Quadra Allura CRTP implanted by Dr Caryl Comes for NICM, CHF, alternating bundle branch blocks   .   Cardioversion  N/A  09/13/2013       Procedure: CARDIOVERSION;  Surgeon: Carlena Bjornstad, MD;  Location: [facility] ENDOSCOPY;  Service: Cardiovascular;  Laterality: N/A;         Current Outpatient Prescriptions   Medication  Sig  Dispense  Refill   .  amiodarone (PACERONE) 200 MG tablet  Take 1 tablet (200 mg total) by mouth 2 (two) times daily.   60 tablet   3   .  apixaban (ELIQUIS) 2.5 MG TABS tablet  Take 1 tablet (2.5 mg total) by  mouth 2 (two) times daily.   60 tablet   6   .  carvedilol (COREG) 6.25 MG tablet  Take 1 tablet (6.25 mg total) by mouth 2 (two) times daily with a meal.   60 tablet   6   .  fenofibrate (TRICOR) 48 MG tablet  Take 0.5 tablets (24 mg total) by mouth daily.   45 tablet   3   .  fish oil-omega-3 fatty acids 1000 MG capsule  Take 2 g by mouth daily.          .  furosemide (LASIX) 40 MG tablet  Take 1 tablet (40 mg total) by mouth 2 (two) times daily.   180 tablet   1   .  Multiple Vitamins-Minerals (OCUVITE PO)  Take 2 tablets by mouth daily.          Marland Kitchen  omeprazole (PRILOSEC) 20 MG capsule  Take 20 mg by mouth daily.          .  tamsulosin (FLOMAX) 0.4 MG CAPS capsule  Take 0.4 mg by mouth 2 (two) times daily.          .  triazolam (HALCION) 0.25 MG tablet  Take 0.25 mg by mouth at bedtime as needed for sleep.              No current facility-administered medications for this visit.        No Known Allergies   Review of Systems negative except from HPI and PMH   Physical Exam BP 127/68  Pulse 80  Ht 5\' 11"  (1.803 m)  Wt 183 lb (83.008 kg)  BMI 25.53 kg/m2 Well developed and well nourished in no acute distress HENT normal E scleral and icterus clear Neck Supple JVP flat; carotids brisk and full Clear to ausculation  Regular rate and rhythm, no murmurs gallops or rub Soft with active bowel sounds No clubbing cyanosis  Edema Alert and oriented, grossly normal motor and sensory function Skin Warm and Dry   ECG demonstrates atrial fibrillation with  biventricular pacing   Assessment and  Plan   Atrial fib/flutter   Atrial lead failure   Alternating bundle branch block   Fatigue/leg weakness   CRT-P  St [employer]    We will plan to undertake cardioversion   at that juncture we will need to understand whether the fatigue is related to the atrial arrhythmia, medication or something altogether different.   Following cardioversion then, if the symptoms persist we will stop the amiodarone.   We'll also need that juncture to understand with one of the atrial lead. We anticipate a chest x-ray at the time of cardioversion.    For DCCV; patient has been taking apixaban twice daily. No changes Kirk Ruths

## 2013-10-18 ENCOUNTER — Encounter (HOSPITAL_COMMUNITY): Payer: Self-pay | Admitting: Cardiology

## 2013-10-19 ENCOUNTER — Encounter (HOSPITAL_COMMUNITY): Payer: Self-pay | Admitting: Emergency Medicine

## 2013-10-19 ENCOUNTER — Inpatient Hospital Stay (HOSPITAL_COMMUNITY)
Admission: EM | Admit: 2013-10-19 | Discharge: 2013-10-24 | DRG: 871 | Disposition: A | Discharge status: Home Health | Payer: Medicare Other | Attending: Internal Medicine | Admitting: Internal Medicine

## 2013-10-19 ENCOUNTER — Emergency Department (HOSPITAL_COMMUNITY): Payer: Medicare Other

## 2013-10-19 ENCOUNTER — Telehealth: Payer: Self-pay | Admitting: Internal Medicine

## 2013-10-19 DIAGNOSIS — E039 Hypothyroidism, unspecified: Secondary | ICD-10-CM | POA: Diagnosis present

## 2013-10-19 DIAGNOSIS — R1030 Lower abdominal pain, unspecified: Secondary | ICD-10-CM

## 2013-10-19 DIAGNOSIS — I959 Hypotension, unspecified: Secondary | ICD-10-CM | POA: Diagnosis present

## 2013-10-19 DIAGNOSIS — R918 Other nonspecific abnormal finding of lung field: Secondary | ICD-10-CM

## 2013-10-19 DIAGNOSIS — I351 Nonrheumatic aortic (valve) insufficiency: Secondary | ICD-10-CM

## 2013-10-19 DIAGNOSIS — E785 Hyperlipidemia, unspecified: Secondary | ICD-10-CM | POA: Diagnosis present

## 2013-10-19 DIAGNOSIS — J189 Pneumonia, unspecified organism: Secondary | ICD-10-CM | POA: Diagnosis present

## 2013-10-19 DIAGNOSIS — M129 Arthropathy, unspecified: Secondary | ICD-10-CM | POA: Diagnosis present

## 2013-10-19 DIAGNOSIS — J4489 Other specified chronic obstructive pulmonary disease: Secondary | ICD-10-CM | POA: Diagnosis present

## 2013-10-19 DIAGNOSIS — I2510 Atherosclerotic heart disease of native coronary artery without angina pectoris: Secondary | ICD-10-CM | POA: Diagnosis present

## 2013-10-19 DIAGNOSIS — J690 Pneumonitis due to inhalation of food and vomit: Secondary | ICD-10-CM | POA: Diagnosis present

## 2013-10-19 DIAGNOSIS — E873 Alkalosis: Secondary | ICD-10-CM | POA: Diagnosis not present

## 2013-10-19 DIAGNOSIS — I447 Left bundle-branch block, unspecified: Secondary | ICD-10-CM

## 2013-10-19 DIAGNOSIS — Z950 Presence of cardiac pacemaker: Secondary | ICD-10-CM

## 2013-10-19 DIAGNOSIS — N184 Chronic kidney disease, stage 4 (severe): Secondary | ICD-10-CM | POA: Diagnosis present

## 2013-10-19 DIAGNOSIS — A4189 Other specified sepsis: Secondary | ICD-10-CM

## 2013-10-19 DIAGNOSIS — R064 Hyperventilation: Secondary | ICD-10-CM | POA: Diagnosis not present

## 2013-10-19 DIAGNOSIS — E079 Disorder of thyroid, unspecified: Secondary | ICD-10-CM

## 2013-10-19 DIAGNOSIS — I429 Cardiomyopathy, unspecified: Secondary | ICD-10-CM | POA: Diagnosis present

## 2013-10-19 DIAGNOSIS — R0602 Shortness of breath: Secondary | ICD-10-CM

## 2013-10-19 DIAGNOSIS — I5023 Acute on chronic systolic (congestive) heart failure: Secondary | ICD-10-CM

## 2013-10-19 DIAGNOSIS — I359 Nonrheumatic aortic valve disorder, unspecified: Secondary | ICD-10-CM | POA: Diagnosis present

## 2013-10-19 DIAGNOSIS — J438 Other emphysema: Secondary | ICD-10-CM

## 2013-10-19 DIAGNOSIS — E038 Other specified hypothyroidism: Secondary | ICD-10-CM

## 2013-10-19 DIAGNOSIS — R102 Pelvic and perineal pain: Secondary | ICD-10-CM | POA: Diagnosis present

## 2013-10-19 DIAGNOSIS — I129 Hypertensive chronic kidney disease with stage 1 through stage 4 chronic kidney disease, or unspecified chronic kidney disease: Secondary | ICD-10-CM | POA: Diagnosis present

## 2013-10-19 DIAGNOSIS — J9600 Acute respiratory failure, unspecified whether with hypoxia or hypercapnia: Secondary | ICD-10-CM | POA: Diagnosis present

## 2013-10-19 DIAGNOSIS — M109 Gout, unspecified: Secondary | ICD-10-CM | POA: Diagnosis not present

## 2013-10-19 DIAGNOSIS — I428 Other cardiomyopathies: Secondary | ICD-10-CM | POA: Diagnosis present

## 2013-10-19 DIAGNOSIS — R238 Other skin changes: Secondary | ICD-10-CM

## 2013-10-19 DIAGNOSIS — I10 Essential (primary) hypertension: Secondary | ICD-10-CM

## 2013-10-19 DIAGNOSIS — J449 Chronic obstructive pulmonary disease, unspecified: Secondary | ICD-10-CM | POA: Diagnosis present

## 2013-10-19 DIAGNOSIS — J9601 Acute respiratory failure with hypoxia: Secondary | ICD-10-CM

## 2013-10-19 DIAGNOSIS — R9430 Abnormal result of cardiovascular function study, unspecified: Secondary | ICD-10-CM

## 2013-10-19 DIAGNOSIS — Z9229 Personal history of other drug therapy: Secondary | ICD-10-CM

## 2013-10-19 DIAGNOSIS — I5022 Chronic systolic (congestive) heart failure: Secondary | ICD-10-CM

## 2013-10-19 DIAGNOSIS — Z7901 Long term (current) use of anticoagulants: Secondary | ICD-10-CM

## 2013-10-19 DIAGNOSIS — I340 Nonrheumatic mitral (valve) insufficiency: Secondary | ICD-10-CM

## 2013-10-19 DIAGNOSIS — I452 Bifascicular block: Secondary | ICD-10-CM | POA: Diagnosis present

## 2013-10-19 DIAGNOSIS — A419 Sepsis, unspecified organism: Secondary | ICD-10-CM | POA: Diagnosis present

## 2013-10-19 DIAGNOSIS — I4891 Unspecified atrial fibrillation: Secondary | ICD-10-CM | POA: Diagnosis present

## 2013-10-19 DIAGNOSIS — I059 Rheumatic mitral valve disease, unspecified: Secondary | ICD-10-CM | POA: Diagnosis present

## 2013-10-19 DIAGNOSIS — I480 Paroxysmal atrial fibrillation: Secondary | ICD-10-CM

## 2013-10-19 DIAGNOSIS — I509 Heart failure, unspecified: Secondary | ICD-10-CM | POA: Diagnosis present

## 2013-10-19 LAB — CBC WITH DIFFERENTIAL/PLATELET
BASOS ABS: 0 10*3/uL (ref 0.0–0.1)
Basophils Relative: 0 % (ref 0–1)
Eosinophils Absolute: 0.1 10*3/uL (ref 0.0–0.7)
Eosinophils Relative: 1 % (ref 0–5)
HCT: 36.9 % — ABNORMAL LOW (ref 39.0–52.0)
Hemoglobin: 12.4 g/dL — ABNORMAL LOW (ref 13.0–17.0)
LYMPHS ABS: 1.5 10*3/uL (ref 0.7–4.0)
LYMPHS PCT: 14 % (ref 12–46)
MCH: 30.5 pg (ref 26.0–34.0)
MCHC: 33.6 g/dL (ref 30.0–36.0)
MCV: 90.9 fL (ref 78.0–100.0)
Monocytes Absolute: 1.1 10*3/uL — ABNORMAL HIGH (ref 0.1–1.0)
Monocytes Relative: 10 % (ref 3–12)
NEUTROS ABS: 8.1 10*3/uL — AB (ref 1.7–7.7)
NEUTROS PCT: 75 % (ref 43–77)
PLATELETS: 216 10*3/uL (ref 150–400)
RBC: 4.06 MIL/uL — ABNORMAL LOW (ref 4.22–5.81)
RDW: 14.2 % (ref 11.5–15.5)
WBC: 10.8 10*3/uL — AB (ref 4.0–10.5)

## 2013-10-19 LAB — COMPREHENSIVE METABOLIC PANEL
ALK PHOS: 51 U/L (ref 39–117)
ALT: 7 U/L (ref 0–53)
AST: 12 U/L (ref 0–37)
Albumin: 3.4 g/dL — ABNORMAL LOW (ref 3.5–5.2)
Anion gap: 19 — ABNORMAL HIGH (ref 5–15)
BUN: 29 mg/dL — ABNORMAL HIGH (ref 6–23)
CHLORIDE: 100 meq/L (ref 96–112)
CO2: 18 meq/L — AB (ref 19–32)
Calcium: 8.3 mg/dL — ABNORMAL LOW (ref 8.4–10.5)
Creatinine, Ser: 1.99 mg/dL — ABNORMAL HIGH (ref 0.50–1.35)
GFR, EST AFRICAN AMERICAN: 31 mL/min — AB (ref >90)
GFR, EST NON AFRICAN AMERICAN: 27 mL/min — AB (ref >90)
GLUCOSE: 124 mg/dL — AB (ref 70–99)
POTASSIUM: 3.8 meq/L (ref 3.7–5.3)
SODIUM: 137 meq/L (ref 137–147)
Total Bilirubin: 1.4 mg/dL — ABNORMAL HIGH (ref 0.3–1.2)
Total Protein: 6.7 g/dL (ref 6.0–8.3)

## 2013-10-19 LAB — URINALYSIS, ROUTINE W REFLEX MICROSCOPIC
Bilirubin Urine: NEGATIVE
Glucose, UA: NEGATIVE mg/dL
Hgb urine dipstick: NEGATIVE
KETONES UR: NEGATIVE mg/dL
LEUKOCYTES UA: NEGATIVE
Nitrite: NEGATIVE
PH: 5 (ref 5.0–8.0)
PROTEIN: NEGATIVE mg/dL
Specific Gravity, Urine: 1.014 (ref 1.005–1.030)
Urobilinogen, UA: 0.2 mg/dL (ref 0.0–1.0)

## 2013-10-19 LAB — LIPASE, BLOOD: Lipase: 28 U/L (ref 11–59)

## 2013-10-19 LAB — PRO B NATRIURETIC PEPTIDE: PRO B NATRI PEPTIDE: 7890 pg/mL — AB (ref 0–450)

## 2013-10-19 LAB — TROPONIN I: Troponin I: <0.3 ng/mL (ref <0.30)

## 2013-10-19 LAB — I-STAT CG4 LACTIC ACID, ED: LACTIC ACID, VENOUS: 1.48 mmol/L (ref 0.5–2.2)

## 2013-10-19 MED ORDER — ACETAMINOPHEN 325 MG PO TABS
650.0000 mg | ORAL_TABLET | Freq: Once | ORAL | Status: AC
  Administered 2013-10-19: 650 mg via ORAL
  Filled 2013-10-19: qty 2

## 2013-10-19 MED ORDER — PIPERACILLIN-TAZOBACTAM 3.375 G IVPB
3.3750 g | Freq: Three times a day (TID) | INTRAVENOUS | Status: DC
  Administered 2013-10-20 (×3): 3.375 g via INTRAVENOUS
  Filled 2013-10-19 (×7): qty 50

## 2013-10-19 MED ORDER — VANCOMYCIN HCL 10 G IV SOLR
1500.0000 mg | Freq: Once | INTRAVENOUS | Status: AC
  Administered 2013-10-19: 1500 mg via INTRAVENOUS
  Filled 2013-10-19: qty 1500

## 2013-10-19 MED ORDER — IPRATROPIUM-ALBUTEROL 0.5-2.5 (3) MG/3ML IN SOLN
3.0000 mL | RESPIRATORY_TRACT | Status: DC
  Administered 2013-10-20 – 2013-10-21 (×12): 3 mL via RESPIRATORY_TRACT
  Filled 2013-10-19 (×13): qty 3

## 2013-10-19 MED ORDER — PIPERACILLIN-TAZOBACTAM 3.375 G IVPB 30 MIN
3.3750 g | Freq: Once | INTRAVENOUS | Status: AC
  Administered 2013-10-19: 3.375 g via INTRAVENOUS
  Filled 2013-10-19: qty 50

## 2013-10-19 MED ORDER — VANCOMYCIN HCL IN DEXTROSE 1-5 GM/200ML-% IV SOLN
1000.0000 mg | INTRAVENOUS | Status: DC
  Filled 2013-10-19 (×2): qty 200

## 2013-10-19 MED ORDER — PIPERACILLIN-TAZOBACTAM 3.375 G IVPB: 3.3750 g | Freq: Three times a day (TID) | INTRAVENOUS | Status: DC

## 2013-10-19 NOTE — ED Provider Notes (Signed)
CSN: [id number]     Arrival date & time 10/19/13  2021 History   First MD Initiated Contact with Patient 10/19/13 2048     Chief Complaint  Patient presents with  . Shortness of Breath  . Fever  . Headache     (Consider location/radiation/quality/duration/timing/severity/associated sxs/prior Treatment) The history is provided by the patient. No language interpreter was used.  Levi Lewis is a 80 year old male with past medical history of left bundle branch block, CAD, hypertension, gout, dyslipidemia, COPD, aortic insufficiency, atrial fibrillation on was presenting to the ED with fever that started yesterday. As per patient, reported that his fever was approximately 102F. Stated he's been having cold-like symptoms. Reported coughing that is productive with a yellowish colored phlegm. Stated that he's been having intermittent headaches for the past 2-3 days. Patient was recently seen in ED setting and discharged on 10/18/2013 regarding cardioversion. Denied chest pain, nasal congestion, abdominal pain, nausea, vomiting, numbness, tingling, melena, hematochezia, neck pain, neck stiffness. PCP Dr. Nyoka Cowden   Past Medical History  Diagnosis Date  . Left bundle branch block   . CAD (coronary artery disease)   . Hypertension   . Gout   . Dyslipidemia   . Lung nodules   . COPD (chronic obstructive pulmonary disease)   . Mitral regurgitation   . Aortic insufficiency   . Shoulder pain   . Back pain     Back and knee pain, June, 2014  . Cardiomyopathy, EF by echo 25-30% 07/14/2013  . RBBB   . Atrial fibrillation   . Pacemaker   . Shortness of breath   . Pneumonia   . Arthritis    Past Surgical History  Procedure Laterality Date  . Back surgery    . Right shoulder    . Right knee surgery    . Tonsillectomy    . Coronary angioplasty    . Bi-ventricular pacemaker insertion (crt-p)  07-15-2013    STJ Quadra Allura CRTP implanted by Dr Caryl Comes for NICM, CHF, alternating bundle branch  blocks  . Cardioversion N/A 09/13/2013    Procedure: CARDIOVERSION;  Surgeon: Carlena Bjornstad, MD;  Location: [facility] ENDOSCOPY;  Service: Cardiovascular;  Laterality: N/A;  . Cardioversion N/A 10/17/2013    Procedure: CARDIOVERSION;  Surgeon: Lelon Perla, MD;  Location: [facility] ENDOSCOPY;  Service: Cardiovascular;  Laterality: N/A;   Family History  Problem Relation Age of Onset  . Heart attack Sister   . Lung cancer Brother     smoker  . Colon cancer Neg Hx    History  Substance Use Topics  . Smoking status: Never Smoker   . Smokeless tobacco: Not on file  . Alcohol Use: No    Review of Systems  Constitutional: Positive for fever. Negative for chills.  Eyes: Negative for visual disturbance.  Respiratory: Positive for cough and shortness of breath. Negative for chest tightness.   Cardiovascular: Negative for chest pain.  Gastrointestinal: Negative for nausea, vomiting, abdominal pain, diarrhea, constipation, blood in stool and anal bleeding.  Genitourinary: Negative for dysuria and hematuria.  Musculoskeletal: Negative for back pain and neck pain.  Neurological: Positive for headaches. Negative for weakness.      Allergies  Review of patient's allergies indicates no known allergies.  Home Medications   Prior to Admission medications   Medication Sig Start Date End Date Taking? Authorizing Provider  acetaminophen (TYLENOL) 325 MG tablet Take 650 mg by mouth every 6 (six) hours as needed for fever.   Yes Historical Provider,  MD  amiodarone (PACERONE) 200 MG tablet Take 1 tablet (200 mg total) by mouth 2 (two) times daily. 09/19/13  Yes Burnell Blanks, MD  apixaban (ELIQUIS) 2.5 MG TABS tablet Take 1 tablet (2.5 mg total) by mouth 2 (two) times daily. 07/25/13  Yes Deboraha Sprang, MD  carvedilol (COREG) 6.25 MG tablet Take 1 tablet (6.25 mg total) by mouth 2 (two) times daily with a meal. 07/16/13  Yes Dayna N Dunn, PA-C  fenofibrate (TRICOR) 48 MG tablet Take 0.5 tablets (24 mg  total) by mouth daily. 10/09/12  Yes Carlena Bjornstad, MD  fish oil-omega-3 fatty acids 1000 MG capsule Take 2 g by mouth daily.    Yes Historical Provider, MD  furosemide (LASIX) 40 MG tablet Take 1 tablet (40 mg total) by mouth 2 (two) times daily. 07/03/13  Yes Carlena Bjornstad, MD  Multiple Vitamins-Minerals (OCUVITE PO) Take 2 tablets by mouth daily.    Yes Historical Provider, MD  omeprazole (PRILOSEC) 20 MG capsule Take 20 mg by mouth daily.    Yes Historical Provider, MD  tamsulosin (FLOMAX) 0.4 MG CAPS capsule Take 0.4 mg by mouth 2 (two) times daily.    Yes Historical Provider, MD  triazolam (HALCION) 0.25 MG tablet Take 0.125 mg by mouth at bedtime as needed for sleep.  06/19/13  Yes Historical Provider, MD   BP 101/62  Pulse 70  Temp(Src) 98 F (36.7 C) (Oral)  Resp 15  Ht 5\' 11"  (1.803 m)  Wt 181 lb (82.1 kg)  BMI 25.26 kg/m2  SpO2 94% Physical Exam  Nursing note and vitals reviewed. Constitutional: He is oriented to person, place, and time. He appears well-developed and well-nourished. No distress.  HENT:  Head: Normocephalic and atraumatic.  Mouth/Throat: No oropharyngeal exudate.  Dry mucus membranes  Eyes: Conjunctivae and EOM are normal. Pupils are equal, round, and reactive to light. Right eye exhibits no discharge. Left eye exhibits no discharge.  Neck: Normal range of motion. Neck supple. No tracheal deviation present.  Cardiovascular: Regular rhythm and normal heart sounds.  Exam reveals no friction rub.   No murmur heard. Mild tachycardia upon ausculation  Cap refill < 3 seconds Negative swelling or pitting edema noted noted to the lower extremities bilaterally  Pulmonary/Chest: Effort normal. No respiratory distress. He has no wheezes. He has no rales. He exhibits no tenderness.  Patient is able to speak in full sentences without difficulty  Negative use of accessory muscles Negative stridor Decreased breath sounds to upper and lower lobes bilaterally  Abdominal:  Soft. Bowel sounds are normal. He exhibits no distension. There is no tenderness. There is no rebound and no guarding.  Negative abdominal distension noted BS normoactive in all 4 quadrants Negative pain upon palpation to the abdomen  Abdomen soft upon palpation  Negative peritoneal signs Negative rigidity or guarding Negative fluid wave Negative signs of ascites  Musculoskeletal: Normal range of motion.  Full ROM to upper and lower extremities without difficulty noted, negative ataxia noted.  Lymphadenopathy:    He has no cervical adenopathy.  Neurological: He is alert and oriented to person, place, and time. No cranial nerve deficit. He exhibits normal muscle tone. Coordination normal.  Skin: Skin is warm and dry. No rash noted. He is not diaphoretic. No erythema.  Psychiatric: He has a normal mood and affect. His behavior is normal. Thought content normal.    ED Course  Procedures (including critical care time)  10:50 PM This provider spoke with Dr. Lemmie Evens.  Jenkins, [facility] - discussed case, history, labs, imaging and vitals in great detail. Patient to be admitted to telemetry as inpatient regarding sepsis and CHF exacerbation.  Results for orders placed during the hospital encounter of 10/19/13  MRSA PCR SCREENING      Result Value Ref Range   MRSA by PCR NEGATIVE  NEGATIVE  CBC WITH DIFFERENTIAL      Result Value Ref Range   WBC 10.8 (*) 4.0 - 10.5 K/uL   RBC 4.06 (*) 4.22 - 5.81 MIL/uL   Hemoglobin 12.4 (*) 13.0 - 17.0 g/dL   HCT 36.9 (*) 39.0 - 52.0 %   MCV 90.9  78.0 - 100.0 fL   MCH 30.5  26.0 - 34.0 pg   MCHC 33.6  30.0 - 36.0 g/dL   RDW 14.2  11.5 - 15.5 %   Platelets 216  150 - 400 K/uL   Neutrophils Relative % 75  43 - 77 %   Neutro Abs 8.1 (*) 1.7 - 7.7 K/uL   Lymphocytes Relative 14  12 - 46 %   Lymphs Abs 1.5  0.7 - 4.0 K/uL   Monocytes Relative 10  3 - 12 %   Monocytes Absolute 1.1 (*) 0.1 - 1.0 K/uL   Eosinophils Relative 1  0 - 5 %   Eosinophils  Absolute 0.1  0.0 - 0.7 K/uL   Basophils Relative 0  0 - 1 %   Basophils Absolute 0.0  0.0 - 0.1 K/uL  COMPREHENSIVE METABOLIC PANEL      Result Value Ref Range   Sodium 137  137 - 147 mEq/L   Potassium 3.8  3.7 - 5.3 mEq/L   Chloride 100  96 - 112 mEq/L   CO2 18 (*) 19 - 32 mEq/L   Glucose, Bld 124 (*) 70 - 99 mg/dL   BUN 29 (*) 6 - 23 mg/dL   Creatinine, Ser 1.99 (*) 0.50 - 1.35 mg/dL   Calcium 8.3 (*) 8.4 - 10.5 mg/dL   Total Protein 6.7  6.0 - 8.3 g/dL   Albumin 3.4 (*) 3.5 - 5.2 g/dL   AST 12  0 - 37 U/L   ALT 7  0 - 53 U/L   Alkaline Phosphatase 51  39 - 117 U/L   Total Bilirubin 1.4 (*) 0.3 - 1.2 mg/dL   GFR calc non Af Amer 27 (*) >90 mL/min   GFR calc Af Amer 31 (*) >90 mL/min   Anion gap 19 (*) 5 - 15  LIPASE, BLOOD      Result Value Ref Range   Lipase 28  11 - 59 U/L  URINALYSIS, ROUTINE W REFLEX MICROSCOPIC      Result Value Ref Range   Color, Urine YELLOW  YELLOW   APPearance CLEAR  CLEAR   Specific Gravity, Urine 1.014  1.005 - 1.030   pH 5.0  5.0 - 8.0   Glucose, UA NEGATIVE  NEGATIVE mg/dL   Hgb urine dipstick NEGATIVE  NEGATIVE   Bilirubin Urine NEGATIVE  NEGATIVE   Ketones, ur NEGATIVE  NEGATIVE mg/dL   Protein, ur NEGATIVE  NEGATIVE mg/dL   Urobilinogen, UA 0.2  0.0 - 1.0 mg/dL   Nitrite NEGATIVE  NEGATIVE   Leukocytes, UA NEGATIVE  NEGATIVE  PRO B NATRIURETIC PEPTIDE      Result Value Ref Range   Pro B Natriuretic peptide (BNP) 7890.0 (*) 0 - 450 pg/mL  TROPONIN I      Result Value Ref Range   Troponin  I <0.30  <0.30 ng/mL  I-STAT CG4 LACTIC ACID, ED      Result Value Ref Range   Lactic Acid, Venous 1.48  0.5 - 2.2 mmol/L    Labs Review Labs Reviewed  CBC WITH DIFFERENTIAL - Abnormal; Notable for the following:    WBC 10.8 (*)    RBC 4.06 (*)    Hemoglobin 12.4 (*)    HCT 36.9 (*)    Neutro Abs 8.1 (*)    Monocytes Absolute 1.1 (*)    All other components within normal limits  COMPREHENSIVE METABOLIC PANEL - Abnormal; Notable for the  following:    CO2 18 (*)    Glucose, Bld 124 (*)    BUN 29 (*)    Creatinine, Ser 1.99 (*)    Calcium 8.3 (*)    Albumin 3.4 (*)    Total Bilirubin 1.4 (*)    GFR calc non Af Amer 27 (*)    GFR calc Af Amer 31 (*)    Anion gap 19 (*)    All other components within normal limits  PRO B NATRIURETIC PEPTIDE - Abnormal; Notable for the following:    Pro B Natriuretic peptide (BNP) 7890.0 (*)    All other components within normal limits  MRSA PCR SCREENING  CULTURE, BLOOD (ROUTINE X 2)  CULTURE, BLOOD (ROUTINE X 2)  LIPASE, BLOOD  URINALYSIS, ROUTINE W REFLEX MICROSCOPIC  TROPONIN I  BASIC METABOLIC PANEL  CBC  I-STAT CG4 LACTIC ACID, ED    Imaging Review Dg Chest Port 1 View  10/19/2013   CLINICAL DATA:  Fever. Cough. Chest congestion. Biventricular pacemaker placed approximately 2-3 months ago.  EXAM: PORTABLE CHEST - 1 VIEW  COMPARISON:  Portable chest x-ray 10/17/2013. Two-view chest x-ray 07/16/2013, 07/03/2013, 09/28/2012.  FINDINGS: Left subclavian biventricular pacemaker unchanged and appears intact. Cardiac silhouette moderately enlarged but stable. Pulmonary venous hypertension and mild diffuse interstitial pulmonary edema, asymmetric and increased in the right lung, increased since the examination 2 days ago. Bilateral pleural effusions, right greater than left, also increased. No confluent airspace consolidation.  IMPRESSION: 1. Developing mild CHF, with stable cardiomegaly and mild diffuse interstitial pulmonary edema, asymmetric and increased on the right, worse than the examination 2 days ago. 2. Small bilateral pleural effusions, right greater than left, increased in size since 2 days ago.   Electronically Signed   By: Evangeline Dakin M.D.   On: 10/19/2013 21:08     EKG Interpretation None      MDM   Final diagnoses:  Sepsis due to other etiology  Shortness of breath  CHF exacerbation    Medications  vancomycin (VANCOCIN) IVPB 1000 mg/200 mL premix (not  administered)  piperacillin-tazobactam (ZOSYN) IVPB 3.375 g (not administered)  amiodarone (PACERONE) tablet 200 mg (200 mg Oral Not Given 10/20/13 0115)  tamsulosin (FLOMAX) capsule 0.4 mg (0.4 mg Oral Not Given 10/20/13 0115)  apixaban (ELIQUIS) tablet 2.5 mg (2.5 mg Oral Not Given 10/20/13 0115)  carvedilol (COREG) tablet 6.25 mg (not administered)  zolpidem (AMBIEN) tablet 5 mg (not administered)  fenofibrate tablet 54 mg (not administered)  omega-3 acid ethyl esters (LOVAZA) capsule 1 g (not administered)  pantoprazole (PROTONIX) EC tablet 40 mg (not administered)  beta carotene w/minerals (OCUVITE) tablet 1 tablet (not administered)  0.9 %  sodium chloride infusion ( Intravenous Rate/Dose Change 10/20/13 0137)  acetaminophen (TYLENOL) tablet 650 mg (not administered)    Or  acetaminophen (TYLENOL) suppository 650 mg (not administered)  oxyCODONE (Oxy IR/ROXICODONE) immediate release  tablet 5 mg (not administered)  HYDROmorphone (DILAUDID) injection 0.5-1 mg (not administered)  ondansetron (ZOFRAN) tablet 4 mg (not administered)    Or  ondansetron (ZOFRAN) injection 4 mg (not administered)  alum & mag hydroxide-simeth (MAALOX/MYLANTA) 200-200-20 MG/5ML suspension 30 mL (not administered)  ipratropium-albuterol (DUONEB) 0.5-2.5 (3) MG/3ML nebulizer solution 3 mL (3 mLs Nebulization Not Given 10/20/13 0222)  acetaminophen (TYLENOL) tablet 650 mg (650 mg Oral Given 10/19/13 2138)  vancomycin (VANCOCIN) 1,500 mg in sodium chloride 0.9 % 500 mL IVPB (1,500 mg Intravenous New Bag/Given 10/19/13 2303)  piperacillin-tazobactam (ZOSYN) IVPB 3.375 g (0 g Intravenous Stopped 10/19/13 2343)   Filed Vitals:   10/19/13 2330 10/19/13 2345 10/19/13 2356 10/19/13 2359  BP: 101/51 104/55 101/62   Pulse: 76 76 70   Temp:   98 F (36.7 C)   TempSrc:   Oral   Resp: 28 25 15    Height:    5\' 11"  (1.803 m)  Weight:    181 lb (82.1 kg)  SpO2: 95% 95% 94%    This provider reviewed patient's chart. Patient was last  seen and assessed in ED setting the cardiology regarding cardioversion was performed on 10/18/2013. Patient was discharged home. EKG noted LBBB. Troponin negative elevation. BNP elevated at 7890.0. CBC noted mild elevated white blood cell count 10.8-elevated neutrophils of 8.1. CMP noted BUN elevation of 29 and creatinine elevation of 1.99-when compared to previous labs this appears to be patient's baseline. Lipase negative elevation. Lactic acid negative elevation, 1.48. Urinalysis negative for infection-negative nitrites and leukocytes identified. Chest x-ray noted a developing mild CHF with stable cardiomegaly with mild diffuse interstitial pulmonary edema has increased when compared to 2 days ago. Small bilateral pleural effusions right greater than left increased in size since 2 days ago. Blood cultures as well as urine cultures have been obtained. Tylenol administered in ED setting. Patient started on empiric antibiotics. Patient to be admitted to the hospital regarding sepsis, CHF exacerbation. Upon arrival to the ED patient's pulse ox was 88% on room air-patient does not use oxygen at home. While placed on 3 L per minute of oxygen via nasal cannula patient's pulse ox was only increased to approximately 91%-92%. Patient to be admitted for hypoxia as well. Discussed plan for admission with patient who understood and agreed - family at bedside. Negative drop in blood pressure. Patient stable for transfer.  Jamse Mead, PA-C 10/20/13 [phone]

## 2013-10-19 NOTE — Progress Notes (Addendum)
Attempted to get report. 

## 2013-10-19 NOTE — ED Notes (Signed)
Past Thursday he had here to have his heart back in rhythm

## 2013-10-19 NOTE — ED Notes (Signed)
Patient presents with SOB, fever at home and was given 325 Tylenol (that is all he would take), headache

## 2013-10-19 NOTE — Telephone Encounter (Signed)
  Patient s/p recent DC-CV for AF. His daughter called tonight reporting fevers and chills and shallow breathing. Suspect probable aspiration PNA in setting of DC-CV. I have asked them to bring him to ER for further evaluation and treatment.   Nzinga Ferran,MD 8:05 PM

## 2013-10-19 NOTE — ED Notes (Signed)
Pt. Had cardioversion this past Thursday (10/17/13); Cardiolgists said, "he could of aspirated during anesthesia." hx. Of PNA.

## 2013-10-19 NOTE — Progress Notes (Signed)
ANTIBIOTIC CONSULT NOTE - INITIAL  Pharmacy Consult for vancomycin/zosyn Indication: sepsis  No Known Allergies  Patient Measurements: Wt= 83kg  Vital Signs: Temp: 102.1 F (38.9 C) (07/04 2043) Temp src: Rectal (07/04 2043) BP: 112/59 mmHg (07/04 2100) Pulse Rate: 74 (07/04 2201) Intake/Output from previous day:   Intake/Output from this shift:    Labs:  Recent Labs  10/19/13 2100  WBC 10.8*  HGB 12.4*  PLT 216  CREATININE 1.99*   The CrCl is unknown because both a height and weight (above a minimum accepted value) are required for this calculation. No results found for this basename: VANCOTROUGH, VANCOPEAK, VANCORANDOM, GENTTROUGH, GENTPEAK, GENTRANDOM, TOBRATROUGH, TOBRAPEAK, TOBRARND, AMIKACINPEAK, AMIKACINTROU, AMIKACIN,  in the last 72 hours   Microbiology: No results found for this or any previous visit (from the past 720 hour(s)).  Medical History: Past Medical History  Diagnosis Date  . Left bundle branch block   . CAD (coronary artery disease)   . Hypertension   . Gout   . Dyslipidemia   . Lung nodules   . COPD (chronic obstructive pulmonary disease)   . Mitral regurgitation   . Aortic insufficiency   . Shoulder pain   . Back pain     Back and knee pain, June, 2014  . Cardiomyopathy, EF by echo 25-30% 07/14/2013  . RBBB   . Atrial fibrillation   . Pacemaker   . Shortness of breath   . Pneumonia   . Arthritis     Assessment: 80 yo male with fever to begin vancomycin and zosyn for possible sepsis. WBC= 10.8, tmax= 102.1, SCr= 1.99 (noted SCr has been 1.4 to 1.9 this year), CrCl ~ 20-25.  7/4 vanc>> 7/4 zosyn>>  7/4 blood x2  Goal of Therapy:  Vancomycin trough level 15-20 mcg/ml  Plan: -Vancomycin 1500mg  IV x1 Followed by vancomycin 1000mg  IV q24h -Will follow renal function, cultures and clinical progress  Hildred Laser, Pharm D 10/19/2013 10:20 PM

## 2013-10-19 NOTE — H&P (Signed)
[facility] History and Physical  MELCHOR KIRCHGESSNER PPJ:[id number] DOB: September 26, 1917 DOA: 10/19/2013  Referring physician:  EDP PCP: Criselda Peaches, MD  Specialists:   Chief Complaint:  Fever Chills Cough and SOB  HPI: Levi Lewis is a 80 y.o. male with Multiple Medical Problems including Cardiomyopathy/Chronic CHF with an EF=25%, and Atrial Fibrillation on Amiodarone and Eliquis Rx who presents to the ED with complaints of fevers and chills, and Cough productive of yellow sputum and SOB x 1 day.  He had a fever to 102.1 today and in the ED on presentation he was hypoxic with O2 sats to 88%.   His Chest X-ray revealed Bilateral Pleural Effusion R>L.      Review of Systems:  Constitutional: No Weight Loss, No Weight Gain, Night Sweats, +Fevers, +Chills, Fatigue, or Generalized Weakness HEENT: No Headaches, Difficulty Swallowing,Tooth/Dental Problems,Sore Throat,  No Sneezing, Rhinitis, Ear Ache, Nasal Congestion, or Post Nasal Drip,  Cardio-vascular:  No Chest pain, Orthopnea, PND, Edema in lower extremities, Anasarca, Dizziness, Palpitations  Resp: No Dyspnea, No DOE, +Productive Cough, No Hemoptysis,  No Wheezing.    GI: No Heartburn, Indigestion, Abdominal Pain, Nausea, Vomiting, Diarrhea, Change in Bowel Habits,  Loss of Appetite  GU: No Dysuria, Change in Color of Urine, No Urgency or Frequency.  No flank pain.  Musculoskeletal: No Joint Pain or Swelling.  No Decreased Range of Motion. No Back Pain.  Neurologic: No Syncope, No Seizures, Muscle Weakness, Paresthesia, Vision Disturbance or Loss, No Diplopia, No Vertigo, No Difficulty Walking,  Skin: No Rash or Lesions. Psych: No Change in Mood or Affect. No Depression or Anxiety. No Memory loss. No Confusion or Hallucinations   Past Medical History  Diagnosis Date  . Left bundle branch block   . CAD (coronary artery disease)   . Hypertension   . Gout   . Dyslipidemia   . Lung nodules   . COPD (chronic obstructive pulmonary  disease)   . Mitral regurgitation   . Aortic insufficiency   . Shoulder pain   . Back pain     Back and knee pain, June, 2014  . Cardiomyopathy, EF by echo 25-30% 07/14/2013  . RBBB   . Atrial fibrillation   . Pacemaker   . Shortness of breath   . Pneumonia   . Arthritis     Past Surgical History  Procedure Laterality Date  . Back surgery    . Right shoulder    . Right knee surgery    . Tonsillectomy    . Coronary angioplasty    . Bi-ventricular pacemaker insertion (crt-p)  07-15-2013    STJ Quadra Allura CRTP implanted by Dr Caryl Comes for NICM, CHF, alternating bundle branch blocks  . Cardioversion N/A 09/13/2013    Procedure: CARDIOVERSION;  Surgeon: Carlena Bjornstad, MD;  Location: [place];  Service: Cardiovascular;  Laterality: N/A;  . Cardioversion N/A 10/17/2013    Procedure: CARDIOVERSION;  Surgeon: Lelon Perla, MD;  Location: [facility] ENDOSCOPY;  Service: Cardiovascular;  Laterality: N/A;      Prior to Admission medications   Medication Sig Start Date End Date Taking? Authorizing Provider  acetaminophen (TYLENOL) 325 MG tablet Take 650 mg by mouth every 6 (six) hours as needed for fever.   Yes Historical Provider, MD  amiodarone (PACERONE) 200 MG tablet Take 1 tablet (200 mg total) by mouth 2 (two) times daily. 09/19/13  Yes Burnell Blanks, MD  apixaban (ELIQUIS) 2.5 MG TABS tablet Take 1 tablet (2.5 mg total) by  mouth 2 (two) times daily. 07/25/13  Yes Deboraha Sprang, MD  carvedilol (COREG) 6.25 MG tablet Take 1 tablet (6.25 mg total) by mouth 2 (two) times daily with a meal. 07/16/13  Yes Dayna N Dunn, PA-C  fenofibrate (TRICOR) 48 MG tablet Take 0.5 tablets (24 mg total) by mouth daily. 10/09/12  Yes Carlena Bjornstad, MD  fish oil-omega-3 fatty acids 1000 MG capsule Take 2 g by mouth daily.    Yes Historical Provider, MD  furosemide (LASIX) 40 MG tablet Take 1 tablet (40 mg total) by mouth 2 (two) times daily. 07/03/13  Yes Carlena Bjornstad, MD  Multiple Vitamins-Minerals  (OCUVITE PO) Take 2 tablets by mouth daily.    Yes Historical Provider, MD  omeprazole (PRILOSEC) 20 MG capsule Take 20 mg by mouth daily.    Yes Historical Provider, MD  tamsulosin (FLOMAX) 0.4 MG CAPS capsule Take 0.4 mg by mouth 2 (two) times daily.    Yes Historical Provider, MD  triazolam (HALCION) 0.25 MG tablet Take 0.125 mg by mouth at bedtime as needed for sleep.  06/19/13  Yes Historical Provider, MD    No Known Allergies   Social History:   Married, Able to perform all ADLs.      reports that he has never smoked. He does not have any smokeless tobacco history on file. He reports that he does not drink alcohol or use illicit drugs.     Family History  Problem Relation Age of Onset  . Heart attack Sister   . Lung cancer Brother     smoker  . Colon cancer Neg Hx        Physical Exam:  GEN:  Pleasant Elderly Well Nourished and Well Developed 80 y.o. male examined and in no acute distress; cooperative with exam Filed Vitals:   10/19/13 2201 10/19/13 2215 10/19/13 2230 10/19/13 2245  BP:  107/56 104/53 107/47  Pulse: 74 75 73 70  Temp:      TempSrc:      Resp: 27 22 16 28   SpO2: 91% 92% 93% 92%   Blood pressure 107/47, pulse 70, temperature 102.1 F (38.9 C), temperature source Rectal, resp. rate 28, SpO2 92.00%. PSYCH: He is alert and oriented x4; does not appear anxious does not appear depressed; affect is normal HEENT: Normocephalic and Atraumatic, Mucous membranes pink; PERRLA; EOM intact; Fundi:  Benign;  No scleral icterus, Nares: Patent, Oropharynx: Clear, Fair Dentition, Neck:  FROM, no cervical lymphadenopathy nor thyromegaly or carotid bruit; no JVD; Breasts:: Not examined CHEST WALL: No tenderness CHEST: Normal respiration, clear to auscultation bilaterally HEART: Regular rate and rhythm;  BACK: No kyphosis or scoliosis; no CVA tenderness ABDOMEN: Positive Bowel Sounds,  soft non-tender; no masses, no organomegaly. Rectal Exam: Not done EXTREMITIES: No  cyanosis, clubbing or edema; no ulcerations. Genitalia: not examined PULSES: 2+ and symmetric SKIN: Normal hydration no rash or ulceration CNS:  Alert and Oriented x 4, No Focal Deficits.     Vascular: pulses palpable throughout    Labs on Admission:  Basic Metabolic Panel:  Recent Labs Lab 10/15/13 1536 10/19/13 2100  NA 140 137  K 3.4* 3.8  CL 108 100  CO2 25 18*  GLUCOSE 114* 124*  BUN 29* 29*  CREATININE 1.9* 1.99*  CALCIUM 8.7 8.3*   Liver Function Tests:  Recent Labs Lab 10/15/13 1536 10/19/13 2100  AST 20 12  ALT 13 7  ALKPHOS 52 51  BILITOT 0.7 1.4*  PROT 7.0 6.7  ALBUMIN  3.7 3.4*    Recent Labs Lab 10/19/13 2100  LIPASE 28   No results found for this basename: AMMONIA,  in the last 168 hours CBC:  Recent Labs Lab 10/15/13 1536 10/19/13 2100  WBC 7.7 10.8*  NEUTROABS 4.2 8.1*  HGB 12.7* 12.4*  HCT 37.5* 36.9*  MCV 92.2 90.9  PLT 285.0 216   Cardiac Enzymes:  Recent Labs Lab 10/19/13 2150  TROPONINI <0.30    BNP (last 3 results)  Recent Labs  07/12/13 0155 10/19/13 2150  PROBNP 5240.0* 7890.0*   CBG: No results found for this basename: GLUCAP,  in the last 168 hours  Radiological Exams on Admission: Dg Chest Port 1 View  10/19/2013   CLINICAL DATA:  Fever. Cough. Chest congestion. Biventricular pacemaker placed approximately 2-3 months ago.  EXAM: PORTABLE CHEST - 1 VIEW  COMPARISON:  Portable chest x-ray 10/17/2013. Two-view chest x-ray 07/16/2013, 07/03/2013, 09/28/2012.  FINDINGS: Left subclavian biventricular pacemaker unchanged and appears intact. Cardiac silhouette moderately enlarged but stable. Pulmonary venous hypertension and mild diffuse interstitial pulmonary edema, asymmetric and increased in the right lung, increased since the examination 2 days ago. Bilateral pleural effusions, right greater than left, also increased. No confluent airspace consolidation.  IMPRESSION: 1. Developing mild CHF, with stable cardiomegaly and  mild diffuse interstitial pulmonary edema, asymmetric and increased on the right, worse than the examination 2 days ago. 2. Small bilateral pleural effusions, right greater than left, increased in size since 2 days ago.   Electronically Signed   By: Evangeline Dakin M.D.   On: 10/19/2013 21:08      Assessment/Plan:   80 y.o. male with  Principal Problem:   Sepsis Active Problems:   Acute respiratory failure   Hypotension   CAD (coronary artery disease)   Dyslipidemia   COPD (chronic obstructive pulmonary disease)   Atrial fibrillation   CKD (chronic kidney disease) stage 4, GFR 15-29 ml/min   Cardiomyopathy, EF by echo 18-56%   Chronic systolic CHF (congestive heart failure)    Hypothyroid    1.   Sepsis-  Blood Cultures x 2,  IV Vanc and Zosyn, and Gentle IVFs.    2.   Hypotension due to #1,  Hold BP Meds, and Gentle IVFs.   Monitor for Fluid Overload.    3.   Acute Respiratory Failure-  O2 PRN.    4.   CAD- stable.    5.   COPD-  Nebs PRN, O2 PRN.    6.   Dyslipidemia-  Continue  Omega 3 Fatty Acids, and Fenofibrate rx.    7.   Cardiomyopathy /Chronic CHF/ EF=25%-  Continue Carvedilol and Lasix as BP tolerates.     8.   Atrial Fibrillation-  Continue Amiodarone, and Eliquis Rx.   Had Cardioversion 2 days ago.    9.   Hypothyroid- TSH = 6.68, Re-Check TSH.       Code Status:  FULL CODE     Family Communication:   Wife at Bedside Disposition Plan:  Inpatient to Stepdown Bed  Time spent:  [facility] Pager [phone]  If 7PM-7AM, please contact night-coverage [email or web address] Password [email or web address] 10/19/2013, 11:21 PM

## 2013-10-20 DIAGNOSIS — J189 Pneumonia, unspecified organism: Secondary | ICD-10-CM

## 2013-10-20 DIAGNOSIS — I4891 Unspecified atrial fibrillation: Secondary | ICD-10-CM

## 2013-10-20 DIAGNOSIS — J9600 Acute respiratory failure, unspecified whether with hypoxia or hypercapnia: Secondary | ICD-10-CM

## 2013-10-20 DIAGNOSIS — A4189 Other specified sepsis: Secondary | ICD-10-CM

## 2013-10-20 LAB — BASIC METABOLIC PANEL
Anion gap: 16 — ABNORMAL HIGH (ref 5–15)
BUN: 28 mg/dL — AB (ref 6–23)
CALCIUM: 8.4 mg/dL (ref 8.4–10.5)
CHLORIDE: 104 meq/L (ref 96–112)
CO2: 21 meq/L (ref 19–32)
CREATININE: 1.83 mg/dL — AB (ref 0.50–1.35)
GFR calc Af Amer: 35 mL/min — ABNORMAL LOW (ref >90)
GFR calc non Af Amer: 30 mL/min — ABNORMAL LOW (ref >90)
GLUCOSE: 113 mg/dL — AB (ref 70–99)
Potassium: 3.6 mEq/L — ABNORMAL LOW (ref 3.7–5.3)
Sodium: 141 mEq/L (ref 137–147)

## 2013-10-20 LAB — CBC
HEMATOCRIT: 36 % — AB (ref 39.0–52.0)
Hemoglobin: 12 g/dL — ABNORMAL LOW (ref 13.0–17.0)
MCH: 30.5 pg (ref 26.0–34.0)
MCHC: 33.3 g/dL (ref 30.0–36.0)
MCV: 91.6 fL (ref 78.0–100.0)
Platelets: 214 10*3/uL (ref 150–400)
RBC: 3.93 MIL/uL — ABNORMAL LOW (ref 4.22–5.81)
RDW: 14.1 % (ref 11.5–15.5)
WBC: 9.2 10*3/uL (ref 4.0–10.5)

## 2013-10-20 LAB — BLOOD GAS, ARTERIAL
Acid-base deficit: 2.2 mmol/L — ABNORMAL HIGH (ref 0.0–2.0)
Bicarbonate: 20.7 mEq/L (ref 20.0–24.0)
DRAWN BY: 24610
FIO2: 0.28 %
O2 Saturation: 89.8 %
PH ART: 7.484 — AB (ref 7.350–7.450)
Patient temperature: 98.6
TCO2: 21.6 mmol/L (ref 0–100)
pCO2 arterial: 27.9 mmHg — ABNORMAL LOW (ref 35.0–45.0)
pO2, Arterial: 55.1 mmHg — ABNORMAL LOW (ref 80.0–100.0)

## 2013-10-20 LAB — MRSA PCR SCREENING: MRSA by PCR: NEGATIVE

## 2013-10-20 LAB — STREP PNEUMONIAE URINARY ANTIGEN: STREP PNEUMO URINARY ANTIGEN: NEGATIVE

## 2013-10-20 MED ORDER — FENOFIBRATE 54 MG PO TABS
54.0000 mg | ORAL_TABLET | Freq: Every day | ORAL | Status: DC
  Filled 2013-10-20: qty 1

## 2013-10-20 MED ORDER — ACETAMINOPHEN 325 MG PO TABS: 650.0000 mg | ORAL_TABLET | Freq: Four times a day (QID) | ORAL | Status: DC | PRN

## 2013-10-20 MED ORDER — TAMSULOSIN HCL 0.4 MG PO CAPS
0.4000 mg | ORAL_CAPSULE | Freq: Two times a day (BID) | ORAL | Status: DC
  Administered 2013-10-20 – 2013-10-24 (×9): 0.4 mg via ORAL
  Filled 2013-10-20 (×11): qty 1

## 2013-10-20 MED ORDER — HYDROMORPHONE HCL PF 1 MG/ML IJ SOLN: 0.5000 mg | INTRAMUSCULAR | Status: DC | PRN

## 2013-10-20 MED ORDER — OMEGA-3-ACID ETHYL ESTERS 1 G PO CAPS
1.0000 g | ORAL_CAPSULE | Freq: Every day | ORAL | Status: DC
  Filled 2013-10-20: qty 1

## 2013-10-20 MED ORDER — FUROSEMIDE 10 MG/ML IJ SOLN
80.0000 mg | Freq: Three times a day (TID) | INTRAMUSCULAR | Status: DC
  Administered 2013-10-20: 80 mg via INTRAVENOUS
  Filled 2013-10-20: qty 8

## 2013-10-20 MED ORDER — ALUM & MAG HYDROXIDE-SIMETH 200-200-20 MG/5ML PO SUSP: 30.0000 mL | Freq: Four times a day (QID) | ORAL | Status: DC | PRN

## 2013-10-20 MED ORDER — OCUVITE PO TABS
1.0000 | ORAL_TABLET | Freq: Every day | ORAL | Status: DC
  Administered 2013-10-20 – 2013-10-24 (×5): 1 via ORAL
  Filled 2013-10-20 (×7): qty 1

## 2013-10-20 MED ORDER — ZOLPIDEM TARTRATE 5 MG PO TABS
5.0000 mg | ORAL_TABLET | Freq: Every evening | ORAL | Status: DC | PRN
Start: 2013-10-20 — End: 2013-10-24
  Administered 2013-10-20 – 2013-10-21 (×2): 5 mg via ORAL
  Filled 2013-10-20 (×2): qty 1

## 2013-10-20 MED ORDER — OXYCODONE HCL 5 MG PO TABS: 5.0000 mg | ORAL_TABLET | ORAL | Status: DC | PRN

## 2013-10-20 MED ORDER — ONDANSETRON HCL 4 MG PO TABS
4.0000 mg | ORAL_TABLET | Freq: Four times a day (QID) | ORAL | Status: DC | PRN
Start: 2013-10-20 — End: 2013-10-24

## 2013-10-20 MED ORDER — AMIODARONE HCL 200 MG PO TABS
200.0000 mg | ORAL_TABLET | Freq: Two times a day (BID) | ORAL | Status: DC
  Administered 2013-10-20 – 2013-10-22 (×6): 200 mg via ORAL
  Filled 2013-10-20 (×9): qty 1

## 2013-10-20 MED ORDER — APIXABAN 2.5 MG PO TABS
2.5000 mg | ORAL_TABLET | Freq: Two times a day (BID) | ORAL | Status: DC
  Administered 2013-10-20 – 2013-10-24 (×9): 2.5 mg via ORAL
  Filled 2013-10-20 (×11): qty 1

## 2013-10-20 MED ORDER — CARVEDILOL 3.125 MG PO TABS
3.1250 mg | ORAL_TABLET | Freq: Two times a day (BID) | ORAL | Status: DC
  Administered 2013-10-20 – 2013-10-24 (×8): 3.125 mg via ORAL
  Filled 2013-10-20 (×11): qty 1

## 2013-10-20 MED ORDER — ACETAMINOPHEN 650 MG RE SUPP: 650.0000 mg | Freq: Four times a day (QID) | RECTAL | Status: DC | PRN

## 2013-10-20 MED ORDER — CARVEDILOL 6.25 MG PO TABS
6.2500 mg | ORAL_TABLET | Freq: Two times a day (BID) | ORAL | Status: DC
  Administered 2013-10-20: 6.25 mg via ORAL
  Filled 2013-10-20 (×3): qty 1

## 2013-10-20 MED ORDER — POTASSIUM CHLORIDE CRYS ER 20 MEQ PO TBCR
40.0000 meq | EXTENDED_RELEASE_TABLET | ORAL | Status: AC
  Administered 2013-10-20 (×2): 40 meq via ORAL
  Filled 2013-10-20 (×2): qty 2

## 2013-10-20 MED ORDER — POTASSIUM CHLORIDE CRYS ER 20 MEQ PO TBCR
40.0000 meq | EXTENDED_RELEASE_TABLET | Freq: Two times a day (BID) | ORAL | Status: AC
  Administered 2013-10-21 (×2): 40 meq via ORAL
  Filled 2013-10-20 (×2): qty 2

## 2013-10-20 MED ORDER — PANTOPRAZOLE SODIUM 40 MG PO TBEC
40.0000 mg | DELAYED_RELEASE_TABLET | Freq: Every day | ORAL | Status: DC
  Administered 2013-10-20 – 2013-10-24 (×5): 40 mg via ORAL
  Filled 2013-10-20 (×4): qty 1

## 2013-10-20 MED ORDER — FUROSEMIDE 10 MG/ML IJ SOLN
80.0000 mg | Freq: Two times a day (BID) | INTRAMUSCULAR | Status: DC
  Administered 2013-10-20 – 2013-10-21 (×2): 80 mg via INTRAVENOUS
  Filled 2013-10-20 (×3): qty 8

## 2013-10-20 MED ORDER — SODIUM CHLORIDE 0.9 % IV SOLN: INTRAVENOUS | Status: DC

## 2013-10-20 MED ORDER — ONDANSETRON HCL 4 MG/2ML IJ SOLN: 4.0000 mg | Freq: Four times a day (QID) | INTRAMUSCULAR | Status: DC | PRN

## 2013-10-20 NOTE — Progress Notes (Signed)
[facility] Progress Note   Levi Lewis ZWC:[id number] DOB: [birth year] DOA: 10/19/2013 PCP: Criselda Peaches, MD  Brief narrative: Levi Lewis is a 80 y.o. male  presents with fever on 102, chills, cough with yellow sputum, dyspnea and hypoxia with pulse ox of 88%. Had a DC cardioversion for A-fib on 7/2 and per Dr Bensimhon's note from 7/4, he may have aspirated.    Subjective: Breathing quite fast this AM with RR> 40, pulse ox 88% on 35 % venti- mask. Does not want to be intubated. Coughing up yellow sputum. Daughter at bedside- discussed need to start BiPAP. They are in agreement.   Assessment/Plan: Principal Problem:   Sepsis/  HCAP  - likely aspiration- on Vanc and Zosyn- will stop Vanc in 1-2 days if sputum cultures negative  Acute resp distress (a) HCAP- start BiPAP- ABG reveals hypoxia and hyperventilation causing resp alkalsosis  (b) Acute on chronic systolic CHF-  Cardiomyopathy, EF by echo 25-30% - start Lasix-     Hypotension - cut back on Coreg from 6.25 to 3.125 - follow closely with diuresis  Afib - s/p DCCV x 2 - cont Amio- has pacer - on Eliquis    COPD?? - no on nebs on inhalers per med/rec - cont Albuterol      CKD (chronic kidney disease) stage 4, GFR 15-29 ml/min - stable- follow with diuresis  Presumed CAD? - no cath and not on ASA    Code Status: Do not intubate- discussed with patient and daughter Family Communication: daughter at bedside Disposition Plan: to be determined- follow in SDU for now  Consultants: Will call cardiology  Procedures: none  Antibiotics: Anti-infectives   Start     Dose/Rate Route Frequency Ordered Stop   10/20/13 2300  vancomycin (VANCOCIN) IVPB 1000 mg/200 mL premix     1,000 mg 200 mL/hr over 60 Minutes Intravenous Every 24 hours 10/19/13 2222     10/20/13 0500  piperacillin-tazobactam (ZOSYN) IVPB 3.375 g     3.375 g 12.5 mL/hr over 240 Minutes Intravenous 3 times per day 10/19/13 2224     10/19/13 2300  vancomycin (VANCOCIN) 1,500 mg in sodium chloride 0.9 % 500 mL IVPB     1,500 mg 250 mL/hr over 120 Minutes Intravenous  Once 10/19/13 2222 10/20/13 0103   10/19/13 2300  piperacillin-tazobactam (ZOSYN) IVPB 3.375 g     3.375 g 100 mL/hr over 30 Minutes Intravenous  Once 10/19/13 2222 10/19/13 2343   10/19/13 0500  piperacillin-tazobactam (ZOSYN) IVPB 3.375 g  Status:  Discontinued     3.375 g 12.5 mL/hr over 240 Minutes Intravenous Every 8 hours 10/19/13 2222 10/19/13 2224       DVT prophylaxis: Apixaban  Objective: Filed Weights   10/19/13 2359  Weight: 82.1 kg (181 lb)    Vitals Filed Vitals:   10/20/13 0404 10/20/13 0745 10/20/13 0817 10/20/13 0831  BP: 122/54 126/61  130/60  Pulse: 74 82  76  Temp: 98.2 F (36.8 C) 98.1 F (36.7 C)    TempSrc: Oral Oral    Resp: 37 18  24  Height:      Weight:      SpO2: 90% 90% 87% 96%      Intake/Output Summary (Last 24 hours) at 10/20/13 1039 Last data filed at 10/20/13 1000  Gross per 24 hour  Intake 519.17 ml  Output     60 ml  Net 459.17 ml     Exam: General:  severe resp distress, alert  and oriented Lungs: crackles up to mid lung fields- congested cough Cardiovascular: Regular rate and rhythm without murmur gallop - paced rhythm  Abdomen: Nontender, nondistended, soft, bowel sounds positive, no rebound, no ascites, no appreciable mass Extremities: No significant cyanosis, clubbing + edema bilateral lower extremities  Data Reviewed: Basic Metabolic Panel:  Recent Labs Lab 10/15/13 1536 10/19/13 2100 10/20/13 0303  NA 140 137 141  K 3.4* 3.8 3.6*  CL 108 100 104  CO2 25 18* 21  GLUCOSE 114* 124* 113*  BUN 29* 29* 28*  CREATININE 1.9* 1.99* 1.83*  CALCIUM 8.7 8.3* 8.4   Liver Function Tests:  Recent Labs Lab 10/15/13 1536 10/19/13 2100  AST 20 12  ALT 13 7  ALKPHOS 52 51  BILITOT 0.7 1.4*  PROT 7.0 6.7  ALBUMIN 3.7 3.4*    Recent Labs Lab 10/19/13 2100  LIPASE 28   No  results found for this basename: AMMONIA,  in the last 168 hours CBC:  Recent Labs Lab 10/15/13 1536 10/19/13 2100 10/20/13 0303  WBC 7.7 10.8* 9.2  NEUTROABS 4.2 8.1*  --   HGB 12.7* 12.4* 12.0*  HCT 37.5* 36.9* 36.0*  MCV 92.2 90.9 91.6  PLT 285.0 216 214   Cardiac Enzymes:  Recent Labs Lab 10/19/13 2150  TROPONINI <0.30   BNP (last 3 results)  Recent Labs  07/12/13 0155 10/19/13 2150  PROBNP 5240.0* 7890.0*   CBG: No results found for this basename: GLUCAP,  in the last 168 hours  Recent Results (from the past 240 hour(s))  MRSA PCR SCREENING     Status: None   Collection Time    10/20/13 12:24 AM      Result Value Ref Range Status   MRSA by PCR NEGATIVE  NEGATIVE Final   Comment:            The GeneXpert MRSA Assay (FDA     approved for NASAL specimens     only), is one component of a     comprehensive MRSA colonization     surveillance program. It is not     intended to diagnose MRSA     infection nor to guide or     monitor treatment for     MRSA infections.     Studies:  Recent x-ray studies have been reviewed in detail by the Attending Physician  Scheduled Meds:  Scheduled Meds: . amiodarone  200 mg Oral BID  . apixaban  2.5 mg Oral BID  . beta carotene w/minerals  1 tablet Oral Daily  . carvedilol  3.125 mg Oral BID WC  . ipratropium-albuterol  3 mL Nebulization Q4H  . pantoprazole  40 mg Oral Daily  . piperacillin-tazobactam (ZOSYN)  IV  3.375 g Intravenous 3 times per day  . tamsulosin  0.4 mg Oral BID  . vancomycin  1,000 mg Intravenous Q24H   Continuous Infusions: . sodium chloride 50 mL/hr at 10/20/13 0137    Time spent on care of this patient: 45 min   [place], MD 10/20/2013, 10:39 AM  LOS: 1 day   [facility] Office  [phone] Pager - Text Page per Shea Evans   If 7PM-7AM, please contact night-coverage [email or web address]

## 2013-10-20 NOTE — ED Notes (Signed)
Transporting patient to new room assignment. 

## 2013-10-20 NOTE — ED Provider Notes (Signed)
Medical screening examination/treatment/procedure(s) were conducted as a shared visit with non-physician practitioner(s) and myself.  I personally evaluated the patient during the encounter.   EKG Interpretation None      Pt presents with fever, cough, inc SOB, meets sepsis criteria w/ fever, elevated HR and RR.  CXR with likely CHF, but clincally I have concern for PNA.  IV abx initiated and pt will be admitted. At time of my exam, pt stating he is feeling less SOB. +crackles heard at LLL, but in no resp distress. No signs of volume overload.   Neta Ehlers, MD 10/20/13 [phone]

## 2013-10-20 NOTE — Consult Note (Addendum)
Primary cardiologist: Jamison Oka Consulting cardiologist: Dr. Carlyle Dolly  Clinical Summary Levi Lewis is a 80 y.o.male with history of chronic systolic heart failure LVEF 25-30% by echo 06/2013, CRT-P placement followed by EP, afib with recent cardioversion 10/17/13, admitted with fever, cough, SOB.   He developed fevers and chills with shallow breathing on 10/19/13 and presented to ER. Reported temps of 102 at home, with productive cough with yellowish sputum. Admitted for pneumonia and sepsis, started on abx.   ER vitals p 76 bp 101/51, O2 sats 88% on RA.  CXR mild pulm edema, asymmetric R>L, bilateral pleural effusions R>L increased from 2 days prior. EKG chronic LBBB.  ABG on 0.28 venti mask 7.48/28/55/21.  Blood cultures pending, Na 141, K 3.6, BUN 28, Cr 1.88 (baseline variable 1.4-1.9, WBC 10.8, Hgb 12, pro-BNP 7890, trop neg x 1, lactic acid 1.5  No Known Allergies  Medications Scheduled Medications: . amiodarone  200 mg Oral BID  . apixaban  2.5 mg Oral BID  . beta carotene w/minerals  1 tablet Oral Daily  . carvedilol  3.125 mg Oral BID WC  . furosemide  80 mg Intravenous TID  . ipratropium-albuterol  3 mL Nebulization Q4H  . pantoprazole  40 mg Oral Daily  . piperacillin-tazobactam (ZOSYN)  IV  3.375 g Intravenous 3 times per day  . potassium chloride  40 mEq Oral Q4H  . [START ON 10/21/2013] potassium chloride  40 mEq Oral BID  . tamsulosin  0.4 mg Oral BID  . vancomycin  1,000 mg Intravenous Q24H     Infusions:     PRN Medications:  acetaminophen, acetaminophen, alum & mag hydroxide-simeth, HYDROmorphone (DILAUDID) injection, ondansetron (ZOFRAN) IV, ondansetron, oxyCODONE, zolpidem   Past Medical History  Diagnosis Date  . Left bundle Levi Lewis   . CAD (coronary artery disease)   . Hypertension   . Gout   . Dyslipidemia   . Lung nodules   . COPD (chronic obstructive pulmonary disease)   . Mitral regurgitation   . Aortic insufficiency   .  Shoulder pain   . Back pain     Back and knee pain, June, 2014  . Cardiomyopathy, EF by echo 25-30% 07/14/2013  . RBBB   . Atrial fibrillation   . Pacemaker   . Shortness of breath   . Pneumonia   . Arthritis     Past Surgical History  Procedure Laterality Date  . Back surgery    . Right shoulder    . Right knee surgery    . Tonsillectomy    . Coronary angioplasty    . Bi-ventricular pacemaker insertion (crt-p)  07-15-2013    STJ Quadra Allura CRTP implanted by Dr Caryl Comes for NICM, CHF, alternating bundle Caius Silbernagel blocks  . Cardioversion N/A 09/13/2013    Procedure: CARDIOVERSION;  Surgeon: Carlena Bjornstad, MD;  Location: [facility] ENDOSCOPY;  Service: Cardiovascular;  Laterality: N/A;  . Cardioversion N/A 10/17/2013    Procedure: CARDIOVERSION;  Surgeon: Lelon Perla, MD;  Location: [facility] ENDOSCOPY;  Service: Cardiovascular;  Laterality: N/A;    Family History  Problem Relation Age of Onset  . Heart attack Sister   . Lung cancer Brother     smoker  . Colon cancer Neg Hx     Social History Levi Lewis reports that he has never smoked. He does not have any smokeless tobacco history on file. Levi Lewis reports that he does not drink alcohol.  Review of Systems CONSTITUTIONAL: No weight loss, fever, chills,  weakness or fatigue.  HEENT: Eyes: No visual loss, blurred vision, double vision or yellow sclerae. No hearing loss, sneezing, congestion, runny nose or sore throat.  SKIN: No rash or itching.  CARDIOVASCULAR: per HPI RESPIRATORY: per HPI.  GASTROINTESTINAL: No anorexia, nausea, vomiting or diarrhea. No abdominal pain or blood.  GENITOURINARY: no polyuria, no dysuria NEUROLOGICAL: No headache, dizziness, syncope, paralysis, ataxia, numbness or tingling in the extremities. No change in bowel or bladder control.  MUSCULOSKELETAL: No muscle, back pain, joint pain or stiffness.  HEMATOLOGIC: No anemia, bleeding or bruising.  LYMPHATICS: No enlarged nodes. No history of splenectomy.    PSYCHIATRIC: No history of depression or anxiety.      Physical Examination Blood pressure 114/59, pulse 71, temperature 98.1 F (36.7 C), temperature source Oral, resp. rate 24, height 5\' 11"  (1.803 m), weight 181 lb (82.1 kg), SpO2 94.00%.  Intake/Output Summary (Last 24 hours) at 10/20/13 1131 Last data filed at 10/20/13 1000  Gross per 24 hour  Intake 519.17 ml  Output     60 ml  Net 459.17 ml    HEENT: sclera clear  Cardiovascular: decreased heart sounds, RRR, no m/r/g, JVD just below angle of jaw.   Respiratory: coarse anteriorally  GI: abdomen soft, NT, ND  MSK: no LE edema  Neuro: no focal deficits  Psych: appropriate affect   Lab Results  Basic Metabolic Panel:  Recent Labs Lab 10/15/13 1536 10/19/13 2100 10/20/13 0303  NA 140 137 141  K 3.4* 3.8 3.6*  CL 108 100 104  CO2 25 18* 21  GLUCOSE 114* 124* 113*  BUN 29* 29* 28*  CREATININE 1.9* 1.99* 1.83*  CALCIUM 8.7 8.3* 8.4    Liver Function Tests:  Recent Labs Lab 10/15/13 1536 10/19/13 2100  AST 20 12  ALT 13 7  ALKPHOS 52 51  BILITOT 0.7 1.4*  PROT 7.0 6.7  ALBUMIN 3.7 3.4*    CBC:  Recent Labs Lab 10/15/13 1536 10/19/13 2100 10/20/13 0303  WBC 7.7 10.8* 9.2  NEUTROABS 4.2 8.1*  --   HGB 12.7* 12.4* 12.0*  HCT 37.5* 36.9* 36.0*  MCV 92.2 90.9 91.6  PLT 285.0 216 214    Cardiac Enzymes:  Recent Labs Lab 10/19/13 2150  TROPONINI <0.30    BNP: No components found with this basename: POCBNP,    Impression/Recommendations 1. Acute on chronic systolic heart failure - CXR with mild pulm edema, worsened pleural effusions from prior films. Exacerbating factor could potentially be respiratory tract infection. Elevated BNP with evidence of volume overload on exam.  - lasix 80mg  IV started today, follow uop. Would start with 80mg  IV bid.  - agree with cutting back coreg to 3.125mg  bid in setting of sepsis,   2. Hypoxemia - multifactorial with fluid overload, fever and  initially elevated WBC with productive cough suggestive of pneumonia. With recent DCCV risk of aspiration pneumonia vs pneumonitis - on abx per primary team, cultures pending.  - per primary notes patient does not want to be intubated, started on Bipap.   3. Afib - recent DCCV 10/17/13, remains on amio coreg,and eliquis.  - repeat TSH and free T4 on amio   Carlyle Dolly, M.D., F.A.C.C.

## 2013-10-21 ENCOUNTER — Encounter: Payer: Self-pay | Admitting: Cardiology

## 2013-10-21 DIAGNOSIS — Z950 Presence of cardiac pacemaker: Secondary | ICD-10-CM

## 2013-10-21 DIAGNOSIS — N184 Chronic kidney disease, stage 4 (severe): Secondary | ICD-10-CM

## 2013-10-21 DIAGNOSIS — I5023 Acute on chronic systolic (congestive) heart failure: Secondary | ICD-10-CM

## 2013-10-21 DIAGNOSIS — I428 Other cardiomyopathies: Secondary | ICD-10-CM

## 2013-10-21 LAB — LEGIONELLA ANTIGEN, URINE: Legionella Antigen, Urine: NEGATIVE

## 2013-10-21 LAB — CBC
HEMATOCRIT: 36.1 % — AB (ref 39.0–52.0)
Hemoglobin: 11.9 g/dL — ABNORMAL LOW (ref 13.0–17.0)
MCH: 30.4 pg (ref 26.0–34.0)
MCHC: 33 g/dL (ref 30.0–36.0)
MCV: 92.3 fL (ref 78.0–100.0)
Platelets: 229 10*3/uL (ref 150–400)
RBC: 3.91 MIL/uL — ABNORMAL LOW (ref 4.22–5.81)
RDW: 14.1 % (ref 11.5–15.5)
WBC: 8.8 10*3/uL (ref 4.0–10.5)

## 2013-10-21 LAB — BASIC METABOLIC PANEL
ANION GAP: 15 (ref 5–15)
BUN: 30 mg/dL — ABNORMAL HIGH (ref 6–23)
CALCIUM: 8.7 mg/dL (ref 8.4–10.5)
CHLORIDE: 102 meq/L (ref 96–112)
CO2: 23 meq/L (ref 19–32)
CREATININE: 1.8 mg/dL — AB (ref 0.50–1.35)
GFR calc non Af Amer: 31 mL/min — ABNORMAL LOW (ref >90)
GFR, EST AFRICAN AMERICAN: 35 mL/min — AB (ref >90)
Glucose, Bld: 110 mg/dL — ABNORMAL HIGH (ref 70–99)
Potassium: 3.8 mEq/L (ref 3.7–5.3)
SODIUM: 140 meq/L (ref 137–147)

## 2013-10-21 LAB — T4, FREE: FREE T4: 1.13 ng/dL (ref 0.80–1.80)

## 2013-10-21 LAB — TSH: TSH: 10.01 u[IU]/mL — ABNORMAL HIGH (ref 0.350–4.500)

## 2013-10-21 MED ORDER — WHITE PETROLATUM GEL
Status: AC
  Administered 2013-10-21: 10:00:00
  Filled 2013-10-21: qty 5

## 2013-10-21 MED ORDER — BISACODYL 5 MG PO TBEC
10.0000 mg | DELAYED_RELEASE_TABLET | Freq: Every day | ORAL | Status: DC | PRN
  Filled 2013-10-21: qty 2

## 2013-10-21 MED ORDER — SODIUM CHLORIDE 0.9 % IV SOLN
1.5000 g | Freq: Two times a day (BID) | INTRAVENOUS | Status: DC
  Administered 2013-10-21 – 2013-10-22 (×4): 1.5 g via INTRAVENOUS
  Filled 2013-10-21 (×6): qty 1.5

## 2013-10-21 MED ORDER — POLYETHYLENE GLYCOL 3350 17 G PO PACK
17.0000 g | PACK | Freq: Every day | ORAL | Status: DC | PRN
  Filled 2013-10-21: qty 1

## 2013-10-21 MED ORDER — FUROSEMIDE 10 MG/ML IJ SOLN
40.0000 mg | Freq: Two times a day (BID) | INTRAMUSCULAR | Status: DC
  Administered 2013-10-21: 40 mg via INTRAVENOUS
  Filled 2013-10-21 (×3): qty 4

## 2013-10-21 NOTE — Progress Notes (Signed)
SUBJECTIVE:  SOB has improved  OBJECTIVE:   Vitals:   Filed Vitals:   10/21/13 0354 10/21/13 0802 10/21/13 0817 10/21/13 1144  BP: 95/51  103/58   Pulse: 73  75   Temp:   97.8 F (36.6 C)   TempSrc:   Oral   Resp: 25  15   Height:      Weight:      SpO2: 91% 97% 93% 95%   I&O's:   Intake/Output Summary (Last 24 hours) at 10/21/13 1155 Last data filed at 10/21/13 0400  Gross per 24 hour  Intake      0 ml  Output   4450 ml  Net  -4450 ml   TELEMETRY: Reviewed telemetry pt in AV paced     PHYSICAL EXAM General: Well developed, well nourished, in no acute distress Head: Eyes PERRLA, No xanthomas.   Normal cephalic and atramatic  Lungs:   Clear bilaterally to auscultation and percussion. Heart:   HRRR S1 S2 Pulses are 2+ & equal. Abdomen: Bowel sounds are positive, abdomen soft and non-tender without masses Extremities:   No clubbing, cyanosis or edema.  DP +1 Neuro: Alert and oriented X 3. Psych:  Good affect, responds appropriately   LABS: Basic Metabolic Panel:  Recent Labs  10/20/13 0303 10/21/13 0333  NA 141 140  K 3.6* 3.8  CL 104 102  CO2 21 23  GLUCOSE 113* 110*  BUN 28* 30*  CREATININE 1.83* 1.80*  CALCIUM 8.4 8.7   Liver Function Tests:  Recent Labs  10/19/13 2100  AST 12  ALT 7  ALKPHOS 51  BILITOT 1.4*  PROT 6.7  ALBUMIN 3.4*    Recent Labs  10/19/13 2100  LIPASE 28   CBC:  Recent Labs  10/19/13 2100 10/20/13 0303 10/21/13 0333  WBC 10.8* 9.2 8.8  NEUTROABS 8.1*  --   --   HGB 12.4* 12.0* 11.9*  HCT 36.9* 36.0* 36.1*  MCV 90.9 91.6 92.3  PLT 216 214 229   Cardiac Enzymes:  Recent Labs  10/19/13 2150  TROPONINI <0.30   BNP: No components found with this basename: POCBNP,  D-Dimer: No results found for this basename: DDIMER,  in the last 72 hours Hemoglobin A1C: No results found for this basename: HGBA1C,  in the last 72 hours Fasting Lipid Panel: No results found for this basename: CHOL, HDL, LDLCALC, TRIG,  CHOLHDL, LDLDIRECT,  in the last 72 hours Thyroid Function Tests:  Recent Labs  10/21/13 0333  TSH 10.010*   Anemia Panel: No results found for this basename: VITAMINB12, FOLATE, FERRITIN, TIBC, IRON, RETICCTPCT,  in the last 72 hours Coag Panel:   Lab Results  Component Value Date   INR 0.98 09/30/2010   INR 1.0 08/06/2008    RADIOLOGY: Dg Chest Port 1 View  10/19/2013   CLINICAL DATA:  Fever. Cough. Chest congestion. Biventricular pacemaker placed approximately 2-3 months ago.  EXAM: PORTABLE CHEST - 1 VIEW  COMPARISON:  Portable chest x-ray 10/17/2013. Two-view chest x-ray 07/16/2013, 07/03/2013, 09/28/2012.  FINDINGS: Left subclavian biventricular pacemaker unchanged and appears intact. Cardiac silhouette moderately enlarged but stable. Pulmonary venous hypertension and mild diffuse interstitial pulmonary edema, asymmetric and increased in the right lung, increased since the examination 2 days ago. Bilateral pleural effusions, right greater than left, also increased. No confluent airspace consolidation.  IMPRESSION: 1. Developing mild CHF, with stable cardiomegaly and mild diffuse interstitial pulmonary edema, asymmetric and increased on the right, worse than the examination 2 days ago. 2. Small  bilateral pleural effusions, right greater than left, increased in size since 2 days ago.   Electronically Signed   By: Evangeline Dakin M.D.   On: 10/19/2013 21:08   Dg Chest Port 1 View  10/17/2013   CLINICAL DATA:  Postop bronchoscopy  EXAM: PORTABLE CHEST - 1 VIEW  COMPARISON:  07/16/2013  FINDINGS: Stable mild cardiac enlargement. Bilateral lower lobe atelectasis. No pneumothorax or consolidation.  IMPRESSION: No acute abnormality   Electronically Signed   By: Skipper Cliche M.D.   On: 10/17/2013 13:26   Impression/Recommendations  1. Acute on chronic systolic heart failure  - CXR with mild pulm edema, worsened pleural effusions from prior films. Exacerbating factor could potentially be  respiratory tract infection. Elevated BNP with evidence of volume overload on exam. He is 3.9L negative - Continue IV Lasix/BB.  No ACE I due to underlying renal insuff 2. Hypoxemia  - multifactorial with fluid overload, fever and initially elevated WBC with productive cough suggestive of pneumonia. With recent DCCV risk of aspiration pneumonia vs pneumonitis  - on abx per primary team, cultures pending.   3. Afib  - recent DCCV 10/17/13, remains on amio, coreg,and eliquis.  - TSH  elevated on Amio with normal Free T4 per IM 4.  CKD stage 3-4 - improving with diuresis    Levi Margarita, MD  10/21/2013  11:55 AM

## 2013-10-21 NOTE — Progress Notes (Signed)
ANTIBIOTIC CONSULT NOTE - FOLLOW UP  Pharmacy Consult for Unasyn Indication: Aspiration PNA  No Known Allergies  Patient Measurements: Height: 5\' 11"  (180.3 cm) Weight: 181 lb (82.1 kg) IBW/kg (Calculated) : 75.3 Adjusted Body Weight:   Vital Signs: Temp: 97.8 F (36.6 C) (07/06 0817) Temp src: Oral (07/06 0817) BP: 103/58 mmHg (07/06 0817) Pulse Rate: 75 (07/06 0817) Intake/Output from previous day: 07/05 0701 - 07/06 0700 In: 469.2 [I.V.:419.2; IV Piggyback:50] Out: 1610 [Urine:4510] Intake/Output from this shift:    Labs:  Recent Labs  10/19/13 2100 10/20/13 0303 10/21/13 0333  WBC 10.8* 9.2 8.8  HGB 12.4* 12.0* 11.9*  PLT 216 214 229  CREATININE 1.99* 1.83* 1.80*   Estimated Creatinine Clearance: 26.7 ml/min (by C-G formula based on Cr of 1.8). No results found for this basename: VANCOTROUGH, Corlis Leak, VANCORANDOM, GENTTROUGH, GENTPEAK, GENTRANDOM, TOBRATROUGH, TOBRAPEAK, TOBRARND, AMIKACINPEAK, AMIKACINTROU, AMIKACIN,  in the last 72 hours   Microbiology: Recent Results (from the past 720 hour(s))  CULTURE, BLOOD (ROUTINE X 2)     Status: None   Collection Time    10/19/13  8:50 PM      Result Value Ref Range Status   Specimen Description BLOOD LEFT ARM   Final   Special Requests BOTTLES DRAWN AEROBIC AND ANAEROBIC 8CC EA   Final   Culture  Setup Time     Final   Value: 10/20/2013 02:09     Performed at [facility]   Culture     Final   Value:        BLOOD CULTURE RECEIVED NO GROWTH TO DATE CULTURE WILL BE HELD FOR 5 DAYS BEFORE ISSUING A FINAL NEGATIVE REPORT     Performed at [facility]   Report Status PENDING   Incomplete  CULTURE, BLOOD (ROUTINE X 2)     Status: None   Collection Time    10/19/13  8:55 PM      Result Value Ref Range Status   Specimen Description BLOOD RIGHT ARM   Final   Special Requests BOTTLES DRAWN AEROBIC AND ANAEROBIC 5CC EA   Final   Culture  Setup Time     Final   Value: 10/20/2013 02:09     Performed  at [facility]   Culture     Final   Value:        BLOOD CULTURE RECEIVED NO GROWTH TO DATE CULTURE WILL BE HELD FOR 5 DAYS BEFORE ISSUING A FINAL NEGATIVE REPORT     Performed at [facility]   Report Status PENDING   Incomplete  MRSA PCR SCREENING     Status: None   Collection Time    10/20/13 12:24 AM      Result Value Ref Range Status   MRSA by PCR NEGATIVE  NEGATIVE Final   Comment:            The GeneXpert MRSA Assay (FDA     approved for NASAL specimens     only), is one component of a     comprehensive MRSA colonization     surveillance program. It is not     intended to diagnose MRSA     infection nor to guide or     monitor treatment for     MRSA infections.    Anti-infectives   Start     Dose/Rate Route Frequency Ordered Stop   10/20/13 2300  vancomycin (VANCOCIN) IVPB 1000 mg/200 mL premix  Status:  Discontinued  1,000 mg 200 mL/hr over 60 Minutes Intravenous Every 24 hours 10/19/13 2222 10/21/13 0812   10/20/13 0500  piperacillin-tazobactam (ZOSYN) IVPB 3.375 g     3.375 g 12.5 mL/hr over 240 Minutes Intravenous 3 times per day 10/19/13 2224     10/19/13 2300  vancomycin (VANCOCIN) 1,500 mg in sodium chloride 0.9 % 500 mL IVPB     1,500 mg 250 mL/hr over 120 Minutes Intravenous  Once 10/19/13 2222 10/20/13 0103   10/19/13 2300  piperacillin-tazobactam (ZOSYN) IVPB 3.375 g     3.375 g 100 mL/hr over 30 Minutes Intravenous  Once 10/19/13 2222 10/19/13 2343   10/19/13 0500  piperacillin-tazobactam (ZOSYN) IVPB 3.375 g  Status:  Discontinued     3.375 g 12.5 mL/hr over 240 Minutes Intravenous Every 8 hours 10/19/13 2222 10/19/13 2224      Assessment: Admit Complaint: fever, chills, cough, SOB  PMH: CHF, afib, CAD, HTN, HLD, COPD, pacemaker  Anticoagulation: Eliquis as PTA for afib, dose 2.5mg  bid ok (age >80, sCr > 1.5). CBC relatively stable.  Infectious Disease: ChangeVanc/Zosyn to Unasyn for aspiration PNA. , afebrile, WBC wnl  8.8  7/6 Unasyn>> 7/4 Vanc>>7/6 7/4 Zosyn>>7/6 7/4 blood x 2>>NGTD  Cardiovascular: EF 25-30% - 103/58, HR 75 VSS - amio, coreg, IV Lasix, Kdur 40 BID (K=3.8)  Nephrology: sCr 1.8 (baseline 1.4-1.5), on flomax,  Hematology / Oncology:CBC stable  PTA Medication Issues: fenofibrate, fish oil, MVI  Best Practices: apix   Goal of Therapy:  Treatment of aspiration PNA  Plan:  Unasyn 1.5g IV q12h   Jayleon Mcfarlane S. Alford Highland, PharmD, BCPS Clinical Staff Pharmacist Pager [phone]  [place], [place] 10/21/2013,8:26 AM

## 2013-10-21 NOTE — Progress Notes (Addendum)
[facility] Progress Note   Levi Lewis KCL:[id number] DOB: January 14, 1918 DOA: 10/19/2013 PCP: Criselda Peaches, MD  Brief narrative: Levi Lewis is a 80 y.o. male  presents with fever on 102, chills, cough with yellow sputum, dyspnea and hypoxia with pulse ox of 88%. Had a DC cardioversion for A-fib on 7/2 and per Dr Bensimhon's note from 7/4, he may have aspirated.    Subjective: Breathing much better- switching off Bipap to [place] today  Assessment/Plan: Principal Problem:   Sepsis/ acute resp failure - started BiPAP on 7/5 due to resp distress (RR > 40)- ABG revealed hypoxia and hyperventilation causing resp alkalsosis   Acute resp distress (a) HCAP- - likely aspiration- on Vanc and Zosyn- will stop Vanc - blood cx negative and MRSA screen negative- sputum culture not yet obtained- switch Zosyn to Unasyn  (b) Acute on chronic systolic CHF-  Cardiomyopathy, EF by echo 25-30% - started IV Lasix at 80 BID- in neg balance by 3990 ml- will cut back to 40 IV BID of Lasix  - takes Lasix 40 PO BID at home- - noted not to be on ACE I - appreciate cards consult    Hypotension - cut back on Coreg from 6.25 to 3.125 - follow closely with diuresis  Afib - s/p DCCV x 2 - cont Amio- has pacer - on Eliquis    COPD?? - not on nebs on inhalers per med/rec - cont Albuterol    CKD (chronic kidney disease) stage 3-4 - renal function improving with diuresis  Presumed CAD? - no cath and not on ASA    Code Status: Do not intubate- discussed with patient and daughter Family Communication: daughter at bedside on 7/5 Disposition Plan: to be determined- follow in SDU for now  Consultants: Will call cardiology  Procedures: none  Antibiotics: Anti-infectives   Start     Dose/Rate Route Frequency Ordered Stop   10/20/13 2300  vancomycin (VANCOCIN) IVPB 1000 mg/200 mL premix  Status:  Discontinued     1,000 mg 200 mL/hr over 60 Minutes Intravenous Every 24 hours 10/19/13 2222  10/21/13 0812   10/20/13 0500  piperacillin-tazobactam (ZOSYN) IVPB 3.375 g     3.375 g 12.5 mL/hr over 240 Minutes Intravenous 3 times per day 10/19/13 2224     10/19/13 2300  vancomycin (VANCOCIN) 1,500 mg in sodium chloride 0.9 % 500 mL IVPB     1,500 mg 250 mL/hr over 120 Minutes Intravenous  Once 10/19/13 2222 10/20/13 0103   10/19/13 2300  piperacillin-tazobactam (ZOSYN) IVPB 3.375 g     3.375 g 100 mL/hr over 30 Minutes Intravenous  Once 10/19/13 2222 10/19/13 2343   10/19/13 0500  piperacillin-tazobactam (ZOSYN) IVPB 3.375 g  Status:  Discontinued     3.375 g 12.5 mL/hr over 240 Minutes Intravenous Every 8 hours 10/19/13 2222 10/19/13 2224       DVT prophylaxis: Apixaban  Objective: Filed Weights   10/19/13 2359  Weight: 82.1 kg (181 lb)    Vitals Filed Vitals:   10/21/13 0000 10/21/13 0011 10/21/13 0354 10/21/13 0802  BP: 107/57  95/51   Pulse: 70 71 73   Temp: 98.5 F (36.9 C)     TempSrc: Oral     Resp: 20 27 25    Height:      Weight:      SpO2: 92% 97% 91% 97%      Intake/Output Summary (Last 24 hours) at 10/21/13 4944 Last data filed at 10/21/13 0400  Gross  per 24 hour  Intake 469.17 ml  Output   4510 ml  Net -4040.83 ml     Exam: General:  no resp distress, alert and oriented Lungs: crackles at bases Cardiovascular: Regular rate and rhythm without murmur gallop - paced rhythm  Abdomen: Nontender, nondistended, soft, bowel sounds positive, no rebound, no ascites, no appreciable mass Extremities: No significant cyanosis, clubbing no edema bilateral lower extremities  Data Reviewed: Basic Metabolic Panel:  Recent Labs Lab 10/15/13 1536 10/19/13 2100 10/20/13 0303 10/21/13 0333  NA 140 137 141 140  K 3.4* 3.8 3.6* 3.8  CL 108 100 104 102  CO2 25 18* 21 23  GLUCOSE 114* 124* 113* 110*  BUN 29* 29* 28* 30*  CREATININE 1.9* 1.99* 1.83* 1.80*  CALCIUM 8.7 8.3* 8.4 8.7   Liver Function Tests:  Recent Labs Lab 10/15/13 1536  10/19/13 2100  AST 20 12  ALT 13 7  ALKPHOS 52 51  BILITOT 0.7 1.4*  PROT 7.0 6.7  ALBUMIN 3.7 3.4*    Recent Labs Lab 10/19/13 2100  LIPASE 28   No results found for this basename: AMMONIA,  in the last 168 hours CBC:  Recent Labs Lab 10/15/13 1536 10/19/13 2100 10/20/13 0303 10/21/13 0333  WBC 7.7 10.8* 9.2 8.8  NEUTROABS 4.2 8.1*  --   --   HGB 12.7* 12.4* 12.0* 11.9*  HCT 37.5* 36.9* 36.0* 36.1*  MCV 92.2 90.9 91.6 92.3  PLT 285.0 216 214 229   Cardiac Enzymes:  Recent Labs Lab 10/19/13 2150  TROPONINI <0.30   BNP (last 3 results)  Recent Labs  07/12/13 0155 10/19/13 2150  PROBNP 5240.0* 7890.0*   CBG: No results found for this basename: GLUCAP,  in the last 168 hours  Recent Results (from the past 240 hour(s))  CULTURE, BLOOD (ROUTINE X 2)     Status: None   Collection Time    10/19/13  8:50 PM      Result Value Ref Range Status   Specimen Description BLOOD LEFT ARM   Final   Special Requests BOTTLES DRAWN AEROBIC AND ANAEROBIC 8CC EA   Final   Culture  Setup Time     Final   Value: 10/20/2013 02:09     Performed at [facility]   Culture     Final   Value:        BLOOD CULTURE RECEIVED NO GROWTH TO DATE CULTURE WILL BE HELD FOR 5 DAYS BEFORE ISSUING A FINAL NEGATIVE REPORT     Performed at [facility]   Report Status PENDING   Incomplete  CULTURE, BLOOD (ROUTINE X 2)     Status: None   Collection Time    10/19/13  8:55 PM      Result Value Ref Range Status   Specimen Description BLOOD RIGHT ARM   Final   Special Requests BOTTLES DRAWN AEROBIC AND ANAEROBIC 5CC EA   Final   Culture  Setup Time     Final   Value: 10/20/2013 02:09     Performed at [facility]   Culture     Final   Value:        BLOOD CULTURE RECEIVED NO GROWTH TO DATE CULTURE WILL BE HELD FOR 5 DAYS BEFORE ISSUING A FINAL NEGATIVE REPORT     Performed at [facility]   Report Status PENDING   Incomplete  MRSA PCR SCREENING     Status:  None   Collection Time  10/20/13 12:24 AM      Result Value Ref Range Status   MRSA by PCR NEGATIVE  NEGATIVE Final   Comment:            The GeneXpert MRSA Assay (FDA     approved for NASAL specimens     only), is one component of a     comprehensive MRSA colonization     surveillance program. It is not     intended to diagnose MRSA     infection nor to guide or     monitor treatment for     MRSA infections.     Studies:  Recent x-ray studies have been reviewed in detail by the Attending Physician  Scheduled Meds:  Scheduled Meds: . amiodarone  200 mg Oral BID  . apixaban  2.5 mg Oral BID  . beta carotene w/minerals  1 tablet Oral Daily  . carvedilol  3.125 mg Oral BID WC  . furosemide  40 mg Intravenous BID  . ipratropium-albuterol  3 mL Nebulization Q4H  . pantoprazole  40 mg Oral Daily  . piperacillin-tazobactam (ZOSYN)  IV  3.375 g Intravenous 3 times per day  . potassium chloride  40 mEq Oral BID  . tamsulosin  0.4 mg Oral BID   Continuous Infusions:    Time spent on care of this patient: 60 min   [place], MD 10/21/2013, 8:12 AM  LOS: 2 days   [facility] Office  [phone] Pager - Text Page per Shea Evans   If 7PM-7AM, please contact night-coverage [email or web address]

## 2013-10-22 ENCOUNTER — Inpatient Hospital Stay (HOSPITAL_COMMUNITY): Payer: Medicare Other

## 2013-10-22 DIAGNOSIS — I10 Essential (primary) hypertension: Secondary | ICD-10-CM

## 2013-10-22 LAB — BASIC METABOLIC PANEL
ANION GAP: 18 — AB (ref 5–15)
BUN: 39 mg/dL — ABNORMAL HIGH (ref 6–23)
CO2: 22 mEq/L (ref 19–32)
Calcium: 8.7 mg/dL (ref 8.4–10.5)
Chloride: 102 mEq/L (ref 96–112)
Creatinine, Ser: 2.08 mg/dL — ABNORMAL HIGH (ref 0.50–1.35)
GFR calc Af Amer: 30 mL/min — ABNORMAL LOW (ref >90)
GFR, EST NON AFRICAN AMERICAN: 26 mL/min — AB (ref >90)
Glucose, Bld: 119 mg/dL — ABNORMAL HIGH (ref 70–99)
Potassium: 4.2 mEq/L (ref 3.7–5.3)
SODIUM: 142 meq/L (ref 137–147)

## 2013-10-22 LAB — EXPECTORATED SPUTUM ASSESSMENT W GRAM STAIN, RFLX TO RESP C: Special Requests: NORMAL

## 2013-10-22 LAB — EXPECTORATED SPUTUM ASSESSMENT W REFEX TO RESP CULTURE

## 2013-10-22 MED ORDER — FUROSEMIDE 20 MG PO TABS
20.0000 mg | ORAL_TABLET | Freq: Two times a day (BID) | ORAL | Status: DC
  Filled 2013-10-22: qty 1

## 2013-10-22 MED ORDER — FUROSEMIDE 40 MG PO TABS
40.0000 mg | ORAL_TABLET | Freq: Every day | ORAL | Status: DC
  Administered 2013-10-22 – 2013-10-24 (×3): 40 mg via ORAL
  Filled 2013-10-22 (×3): qty 1

## 2013-10-22 MED ORDER — POLYETHYLENE GLYCOL 3350 17 G PO PACK
17.0000 g | PACK | Freq: Two times a day (BID) | ORAL | Status: DC
  Administered 2013-10-22: 17 g via ORAL
  Filled 2013-10-22 (×4): qty 1

## 2013-10-22 MED ORDER — IPRATROPIUM-ALBUTEROL 0.5-2.5 (3) MG/3ML IN SOLN
3.0000 mL | Freq: Three times a day (TID) | RESPIRATORY_TRACT | Status: DC
  Administered 2013-10-22 – 2013-10-24 (×6): 3 mL via RESPIRATORY_TRACT
  Filled 2013-10-22 (×7): qty 3

## 2013-10-22 NOTE — Care Management Note (Addendum)
  Page 2 of 2   10/24/2013     3:13:43 PM CARE MANAGEMENT NOTE 10/24/2013  Patient:  Levi Lewis, Levi Lewis   Account Number:  [id number]  Date Initiated:  10/22/2013  Documentation initiated by:  Lewis,Levi  Subjective/Objective Assessment:   dx sepsis; lives with spouse    PCP  Levi Lewis     Action/Plan:   Anticipated DC Date:  10/24/2013   Anticipated DC Plan:  [place]  CM consult      [facility] Choice  HOME HEALTH   Choice offered to / List presented to:  C-4 Adult Children   DME arranged  Levi Lewis      DME agency  [place] arranged  HH-1 RN  Levi Lewis.   Status of service:  Completed, signed off Medicare Important Message given?  YES (If response is "NO", the following Medicare IM given date fields will be blank) Date Medicare IM given:  10/22/2013 Medicare IM given by:  Lewis,Levi Date Additional Medicare IM given:   Additional Medicare IM given by:    Discharge Disposition:  HOME/SELF CARE  Per UR Regulation:  Reviewed for med. necessity/level of care/duration of stay  If discussed at [place] of Stay Meetings, dates discussed:    Comments:  Levi Ledesma RN, BSN, MSHL, CCM  Nurse - Case Manager,  (Unit [place] [phone]  10/24/2013 Dispostion Plan:  Home with HHS:  RN, PT  ([place] / Levi Lewis notified)  10/22/13 [place] RN MSN BSN CCM PT recommends home health therapy and rolling walker.  Dtr and pt also request RN to assess/monitor VS, heart and lung sounds, O2 sats.  List of agencies provided, referral made per choice.

## 2013-10-22 NOTE — Clinical Documentation Improvement (Signed)
Possible Clinical Conditions?   Aspiration Pneumonia (POA?) Gram Negative Pneumonia (POA?)  Bacterial pneumonia, specify type if known (POA?) Klebsiella PNA E Coli PNA Pseudomonas PNA Candidiasis PNA Staph/MRSA PNA Strep PNA Pneumococcal PNA H Influenza PNA H Para Influenza PNA Other Condition Cannot Clinically Determine     Risk Factors: Sepsis/HCAP, likely aspiration, on Vanc/Zosyn per 7/05 progress notes. With recent DCCV, risk of aspiration pneumonia vs pneumonitis per 7/05 progress notes.   Thank You, Darrel Hoover, Clinical Documentation Specialist:  [place] Information Management

## 2013-10-22 NOTE — Evaluation (Signed)
Physical Therapy Evaluation Patient Details Name: Levi Lewis MRN: [id number] DOB: Feb 20, 1918 Today's Date: 10/22/2013   History of Present Illness  Pt is a 80 y/o male admitted presents with fever on 102, chills, cough with yellow sputum, dyspnea and hypoxia with pulse ox of 88%. Had a DC cardioversion for A-fib on 7/2.  Clinical Impression  Pt admitted with the above. Pt currently with functional limitations due to the deficits listed below (see PT Problem List). At the time of PT eval pt reports that he is moving slower than his baseline during gait training, and had one minor LOB during a turn with the RW. Pt will benefit from skilled PT to increase their independence and safety with mobility to allow discharge to the venue listed below.      Follow Up Recommendations Home health PT;Supervision for mobility/OOB    Equipment Recommendations  Rolling walker with 5" wheels    Recommendations for Other Services       Precautions / Restrictions Precautions Precautions: Fall Restrictions Weight Bearing Restrictions: No      Mobility  Bed Mobility Overal bed mobility: Needs Assistance Bed Mobility: Supine to Sit     Supine to sit: Min assist     General bed mobility comments: Pt able to transition to EOB with minimal assist for trunk support while coming to full sit.   Transfers Overall transfer level: Needs assistance Equipment used: Rolling walker (2 wheeled) Transfers: Sit to/from Stand Sit to Stand: Min guard         General transfer comment: VC's for hand placement on seated surface for safety. Pt able to power-up to full standing without physical assist.   Ambulation/Gait Ambulation/Gait assistance: Min assist;Min guard Ambulation Distance (Feet): 110 Feet Assistive device: Rolling walker (2 wheeled) Gait Pattern/deviations: Step-through pattern;Decreased step length - right;Narrow base of support;Trunk flexed Gait velocity: Decreased Gait velocity  interpretation: Below normal speed for age/gender General Gait Details: Pt generally required min guard for ambulation, however had slight LOB with turning on one occurance and required min assist to recover. Pt cued for pursed-lip breathing as he was ambulating on RA, maintaining O2 sats at 91-92%.  Stairs            Wheelchair Mobility    Modified Rankin (Stroke Patients Only)       Balance Overall balance assessment: History of Falls;Needs assistance Sitting-balance support: Feet supported;No upper extremity supported Sitting balance-Leahy Scale: Fair     Standing balance support: Bilateral upper extremity supported Standing balance-Leahy Scale: Poor                               Pertinent Vitals/Pain Pt reports no pain throughout session. Pt was on RA throughout session with sats remaining 91-92%.     Home Living Family/patient expects to be discharged to:: Private residence Living Arrangements: Spouse/significant other Available Help at Discharge: Available PRN/intermittently Type of Home: House Home Access: Stairs to enter Entrance Stairs-Rails: Right;Left;Can reach both (Dtr stsates it would be a stretch) Entrance Stairs-Number of Steps: 3 Home Layout: One level Home Equipment: Cane - single point Additional Comments: Takes a bath and sits down into the tub. Has difficulty standing back up. Occasionally requires wife to assist him to stand.     Prior Function Level of Independence: Independent with assistive device(s)         Comments: Occasionally uses the cane. Still driving, does grocery shopping.  Hand Dominance   Dominant Hand: Right    Extremity/Trunk Assessment   Upper Extremity Assessment: Defer to OT evaluation           Lower Extremity Assessment: Generalized weakness      Cervical / Trunk Assessment: Kyphotic  Communication   Communication: HOH  Cognition Arousal/Alertness: Awake/alert Behavior During Therapy:  WFL for tasks assessed/performed Overall Cognitive Status: Within Functional Limits for tasks assessed                      General Comments      Exercises        Assessment/Plan    PT Assessment Patient needs continued PT services  PT Diagnosis Generalized weakness   PT Problem List Decreased strength;Decreased range of motion;Decreased activity tolerance;Decreased balance;Decreased mobility;Decreased knowledge of use of DME;Decreased safety awareness;Decreased knowledge of precautions  PT Treatment Interventions DME instruction;Gait training;Stair training;Functional mobility training;Therapeutic activities;Therapeutic exercise;Neuromuscular re-education;Patient/family education   PT Goals (Current goals can be found in the Care Plan section) Acute Rehab PT Goals Patient Stated Goal: To get stronger PT Goal Formulation: With patient/family Time For Goal Achievement: 11/05/13 Potential to Achieve Goals: Good    Frequency Min 3X/week   Barriers to discharge Decreased caregiver support Pt takes care of his wife at home as she is blind - concerned about his ability for physical assist right at discharge.     Co-evaluation               End of Session Equipment Utilized During Treatment: Gait belt Activity Tolerance: Patient tolerated treatment well Patient left: in chair;with call bell/phone within reach;with family/visitor present Nurse Communication: Mobility status;Other (comment) (O2 status)         Time: 9379-0240 PT Time Calculation (min): 39 min   Charges:   PT Evaluation $Initial PT Evaluation Tier I: 1 Procedure PT Treatments $Gait Training: 8-22 mins $Therapeutic Activity: 8-22 mins   PT G Codes:          Jolyn Lent 10/22/2013, 1:11 PM  Jolyn Lent, PT, DPT Acute Rehabilitation Services Pager: [phone]

## 2013-10-22 NOTE — Progress Notes (Signed)
SUBJECTIVE:  Feels much better  OBJECTIVE:   Vitals:   Filed Vitals:   10/22/13 0750 10/22/13 0751 10/22/13 0809 10/22/13 1212  BP:   111/62 111/55  Pulse:   74 76  Temp:   97.4 F (36.3 C) 97.8 F (36.6 C)  TempSrc:   Oral Oral  Resp:   24 25  Height:      Weight:      SpO2: 88% 93% 94% 98%   I&O's:   Intake/Output Summary (Last 24 hours) at 10/22/13 1448 Last data filed at 10/22/13 0810  Gross per 24 hour  Intake    340 ml  Output   2350 ml  Net  -2010 ml   TELEMETRY: Reviewed telemetry pt in AV paced:     PHYSICAL EXAM General: Well developed, well nourished, in no acute distress Head: Eyes PERRLA, No xanthomas.   Normal cephalic and atramatic  Lungs:   Clear bilaterally to auscultation and percussion. Heart:   HRRR S1 S2 Pulses are 2+ & equal. Abdomen: Bowel sounds are positive, abdomen soft and non-tender without masses  Extremities:   No clubbing, cyanosis or edema.  DP +1 Neuro: Alert and oriented X 3. Psych:  Good affect, responds appropriately   LABS: Basic Metabolic Panel:  Recent Labs  10/21/13 0333 10/22/13 0305  NA 140 142  K 3.8 4.2  CL 102 102  CO2 23 22  GLUCOSE 110* 119*  BUN 30* 39*  CREATININE 1.80* 2.08*  CALCIUM 8.7 8.7   Liver Function Tests:  Recent Labs  10/19/13 2100  AST 12  ALT 7  ALKPHOS 51  BILITOT 1.4*  PROT 6.7  ALBUMIN 3.4*    Recent Labs  10/19/13 2100  LIPASE 28   CBC:  Recent Labs  10/19/13 2100 10/20/13 0303 10/21/13 0333  WBC 10.8* 9.2 8.8  NEUTROABS 8.1*  --   --   HGB 12.4* 12.0* 11.9*  HCT 36.9* 36.0* 36.1*  MCV 90.9 91.6 92.3  PLT 216 214 229   Cardiac Enzymes:  Recent Labs  10/19/13 2150  TROPONINI <0.30   BNP: No components found with this basename: POCBNP,  D-Dimer: No results found for this basename: DDIMER,  in the last 72 hours Hemoglobin A1C: No results found for this basename: HGBA1C,  in the last 72 hours Fasting Lipid Panel: No results found for this basename:  CHOL, HDL, LDLCALC, TRIG, CHOLHDL, LDLDIRECT,  in the last 72 hours Thyroid Function Tests:  Recent Labs  10/21/13 0333  TSH 10.010*   Anemia Panel: No results found for this basename: VITAMINB12, FOLATE, FERRITIN, TIBC, IRON, RETICCTPCT,  in the last 72 hours Coag Panel:   Lab Results  Component Value Date   INR 0.98 09/30/2010   INR 1.0 08/06/2008    RADIOLOGY: Dg Chest Port 1 View  10/22/2013   CLINICAL DATA:  Shortness of Breath  EXAM: PORTABLE CHEST - 1 VIEW  COMPARISON:  October 19, 2013  FINDINGS: There is underlying emphysema. There is patchy consolidation in both lung bases, new. There is mild interstitial edema. Heart is enlarged with pulmonary venous hypertension. Pacemaker leads are attached to the right atrium right ventricle. No pneumothorax. There is superior migration of both humeral heads consistent with chronic rotator cuff tears.  IMPRESSION: New patchy airspace consolidation in the bases. There is cardiomegaly with mild interstitial edema and mild pulmonary venous hypertension consistent with a degree of congestive heart failure. The areas of airspace consolidation the bases could represent either edema or superimposed  pneumonia. No pneumothorax. Chronic rotator cuff tears bilaterally.   Electronically Signed   By: Lowella Grip M.D.   On: 10/22/2013 08:25   Dg Chest Port 1 View  10/19/2013   CLINICAL DATA:  Fever. Cough. Chest congestion. Biventricular pacemaker placed approximately 2-3 months ago.  EXAM: PORTABLE CHEST - 1 VIEW  COMPARISON:  Portable chest x-ray 10/17/2013. Two-view chest x-ray 07/16/2013, 07/03/2013, 09/28/2012.  FINDINGS: Left subclavian biventricular pacemaker unchanged and appears intact. Cardiac silhouette moderately enlarged but stable. Pulmonary venous hypertension and mild diffuse interstitial pulmonary edema, asymmetric and increased in the right lung, increased since the examination 2 days ago. Bilateral pleural effusions, right greater than left,  also increased. No confluent airspace consolidation.  IMPRESSION: 1. Developing mild CHF, with stable cardiomegaly and mild diffuse interstitial pulmonary edema, asymmetric and increased on the right, worse than the examination 2 days ago. 2. Small bilateral pleural effusions, right greater than left, increased in size since 2 days ago.   Electronically Signed   By: Evangeline Dakin M.D.   On: 10/19/2013 21:08   Dg Chest Port 1 View  10/17/2013   CLINICAL DATA:  Postop bronchoscopy  EXAM: PORTABLE CHEST - 1 VIEW  COMPARISON:  07/16/2013  FINDINGS: Stable mild cardiac enlargement. Bilateral lower lobe atelectasis. No pneumothorax or consolidation.  IMPRESSION: No acute abnormality   Electronically Signed   By: Skipper Cliche M.D.   On: 10/17/2013 13:26   Impression/Recommendations  1. Acute on chronic systolic heart failure  - CXR with mild pulm edema, worsened pleural effusions from prior films. Exacerbating factor could potentially be respiratory tract infection. Elevated BNP with evidence of volume overload on exam. He is 5.4L negative and creatinine has now bumped.   - Continue BB. No ACE I due to underlying renal insuff.  Agree with changing IV Lasix to PO 2. Hypoxemia  - multifactorial with fluid overload, fever and initially elevated WBC with productive cough suggestive of pneumonia. With recent DCCV risk of aspiration pneumonia vs pneumonitis  - on abx per primary team, cultures pending.  3. Afib  - recent DCCV 10/17/13, remains on amio, coreg,and eliquis.  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

## 2013-10-22 NOTE — Progress Notes (Addendum)
[facility] Progress Note   Levi Lewis ZES:[id number] DOB: Oct 19, 1917 DOA: 10/19/2013 PCP: Criselda Peaches, MD  Brief narrative: Levi Lewis is a 79 y.o. male  presents with fever on 102, chills, cough with yellow sputum, dyspnea and hypoxia with pulse ox of 88%. Had a DC cardioversion for A-fib on 7/2 and per Dr Bensimhon's note from 7/4, he may have aspirated.    Subjective: Has been on nasal cannula for 24 hrs. Minimal cough. No dyspnea.   Assessment/Plan: Principal Problem:   Sepsis/ acute resp failure - started BiPAP on 7/5 due to resp distress (RR > 40)- ABG revealed hypoxia and hyperventilation causing resp alkalsosis - now on 5 L with pulse ox of 94%- start IS and ambulation- CXR today continues to reveal diffuse infiltrates- suspect this is related to aspiration and less likely edema as he has diuresed 5 L and per renal function, is becoming dry   Acute resp distress (a) HCAP- - likely aspiration- initially started on Vanc and Zosyn-  - 7/6 stopped Vanc - blood cx negative and MRSA screen negative- sputum culture not yet obtained- switched Zosyn to Unasyn - cont Unasyn   (b) Acute on chronic systolic CHF-  Cardiomyopathy, EF by echo 25-30% - started IV Lasix at 80 BID- - takes Lasix 40 PO BID at home- has diuresed well- neg balance by 5 L - will cut Lasix back to home dose due to renal failure (cardiology to change as needed) - noted not to be on ACE I- per cardiology this is due to CKD - appreciate cards consult    Hypotension - cut back on Coreg from 6.25 to 3.125  Afib - s/p DCCV x 2 - cont Amio- has pacer - on Eliquis    COPD?? - not on nebs on inhalers per med/rec - cont Albuterol    CKD (chronic kidney disease) stage 3-4 - renal function wosened today with diuresis  Presumed CAD? - no cath and not on ASA    Code Status: Do not intubate- discussed with patient and daughter Family Communication: daughter at bedside on 7/5- son at bedside  daily afterwards Disposition Plan: to be determined- PT eval-   Consultants: Will call cardiology  Procedures: none  Antibiotics: Anti-infectives   Start     Dose/Rate Route Frequency Ordered Stop   10/21/13 1000  ampicillin-sulbactam (UNASYN) 1.5 g in sodium chloride 0.9 % 50 mL IVPB     1.5 g 100 mL/hr over 30 Minutes Intravenous Every 12 hours 10/21/13 0831     10/20/13 2300  vancomycin (VANCOCIN) IVPB 1000 mg/200 mL premix  Status:  Discontinued     1,000 mg 200 mL/hr over 60 Minutes Intravenous Every 24 hours 10/19/13 2222 10/21/13 0812   10/20/13 0500  piperacillin-tazobactam (ZOSYN) IVPB 3.375 g  Status:  Discontinued     3.375 g 12.5 mL/hr over 240 Minutes Intravenous 3 times per day 10/19/13 2224 10/21/13 0830   10/19/13 2300  vancomycin (VANCOCIN) 1,500 mg in sodium chloride 0.9 % 500 mL IVPB     1,500 mg 250 mL/hr over 120 Minutes Intravenous  Once 10/19/13 2222 10/20/13 0103   10/19/13 2300  piperacillin-tazobactam (ZOSYN) IVPB 3.375 g     3.375 g 100 mL/hr over 30 Minutes Intravenous  Once 10/19/13 2222 10/19/13 2343   10/19/13 0500  piperacillin-tazobactam (ZOSYN) IVPB 3.375 g  Status:  Discontinued     3.375 g 12.5 mL/hr over 240 Minutes Intravenous Every 8 hours 10/19/13 2222 10/19/13 2224  DVT prophylaxis: Apixaban  Objective: Filed Weights   10/19/13 2359 10/22/13 0440  Weight: 82.1 kg (181 lb) 82 kg (180 lb 12.4 oz)    Vitals Filed Vitals:   10/21/13 2325 10/22/13 0000 10/22/13 0011 10/22/13 0440  BP:  94/43 94/43 125/61  Pulse:  73  72  Temp:   98 F (36.7 C) 98 F (36.7 C)  TempSrc:   Oral Oral  Resp:  22  29  Height:      Weight:    82 kg (180 lb 12.4 oz)  SpO2: 95% 91%  94%      Intake/Output Summary (Last 24 hours) at 10/22/13 0623 Last data filed at 10/22/13 0700  Gross per 24 hour  Intake    940 ml  Output   2200 ml  Net  -1260 ml     Exam: General:  no resp distress, alert and oriented Lungs: mild crackles at  bases- significantly improved Cardiovascular: Regular rate and rhythm without murmur gallop - paced rhythm  Abdomen: Nontender, nondistended, soft, bowel sounds positive, no rebound, no ascites, no appreciable mass Extremities: No significant cyanosis, clubbing no edema bilateral lower extremities  Data Reviewed: Basic Metabolic Panel:  Recent Labs Lab 10/15/13 1536 10/19/13 2100 10/20/13 0303 10/21/13 0333 10/22/13 0305  NA 140 137 141 140 142  K 3.4* 3.8 3.6* 3.8 4.2  CL 108 100 104 102 102  CO2 25 18* 21 23 22   GLUCOSE 114* 124* 113* 110* 119*  BUN 29* 29* 28* 30* 39*  CREATININE 1.9* 1.99* 1.83* 1.80* 2.08*  CALCIUM 8.7 8.3* 8.4 8.7 8.7   Liver Function Tests:  Recent Labs Lab 10/15/13 1536 10/19/13 2100  AST 20 12  ALT 13 7  ALKPHOS 52 51  BILITOT 0.7 1.4*  PROT 7.0 6.7  ALBUMIN 3.7 3.4*    Recent Labs Lab 10/19/13 2100  LIPASE 28   No results found for this basename: AMMONIA,  in the last 168 hours CBC:  Recent Labs Lab 10/15/13 1536 10/19/13 2100 10/20/13 0303 10/21/13 0333  WBC 7.7 10.8* 9.2 8.8  NEUTROABS 4.2 8.1*  --   --   HGB 12.7* 12.4* 12.0* 11.9*  HCT 37.5* 36.9* 36.0* 36.1*  MCV 92.2 90.9 91.6 92.3  PLT 285.0 216 214 229   Cardiac Enzymes:  Recent Labs Lab 10/19/13 2150  TROPONINI <0.30   BNP (last 3 results)  Recent Labs  07/12/13 0155 10/19/13 2150  PROBNP 5240.0* 7890.0*   CBG: No results found for this basename: GLUCAP,  in the last 168 hours  Recent Results (from the past 240 hour(s))  CULTURE, BLOOD (ROUTINE X 2)     Status: None   Collection Time    10/19/13  8:50 PM      Result Value Ref Range Status   Specimen Description BLOOD LEFT ARM   Final   Special Requests BOTTLES DRAWN AEROBIC AND ANAEROBIC 8CC EA   Final   Culture  Setup Time     Final   Value: 10/20/2013 02:09     Performed at [facility]   Culture     Final   Value:        BLOOD CULTURE RECEIVED NO GROWTH TO DATE CULTURE WILL BE HELD  FOR 5 DAYS BEFORE ISSUING A FINAL NEGATIVE REPORT     Performed at [facility]   Report Status PENDING   Incomplete  CULTURE, BLOOD (ROUTINE X 2)     Status: None   Collection Time  10/19/13  8:55 PM      Result Value Ref Range Status   Specimen Description BLOOD RIGHT ARM   Final   Special Requests BOTTLES DRAWN AEROBIC AND ANAEROBIC 5CC EA   Final   Culture  Setup Time     Final   Value: 10/20/2013 02:09     Performed at [facility]   Culture     Final   Value:        BLOOD CULTURE RECEIVED NO GROWTH TO DATE CULTURE WILL BE HELD FOR 5 DAYS BEFORE ISSUING A FINAL NEGATIVE REPORT     Performed at [facility]   Report Status PENDING   Incomplete  MRSA PCR SCREENING     Status: None   Collection Time    10/20/13 12:24 AM      Result Value Ref Range Status   MRSA by PCR NEGATIVE  NEGATIVE Final   Comment:            The GeneXpert MRSA Assay (FDA     approved for NASAL specimens     only), is one component of a     comprehensive MRSA colonization     surveillance program. It is not     intended to diagnose MRSA     infection nor to guide or     monitor treatment for     MRSA infections.     Studies:  Recent x-ray studies have been reviewed in detail by the Attending Physician  Scheduled Meds:  Scheduled Meds: . amiodarone  200 mg Oral BID  . ampicillin-sulbactam (UNASYN) IV  1.5 g Intravenous Q12H  . apixaban  2.5 mg Oral BID  . beta carotene w/minerals  1 tablet Oral Daily  . carvedilol  3.125 mg Oral BID WC  . [START ON 10/23/2013] furosemide  20 mg Oral BID  . ipratropium-albuterol  3 mL Nebulization TID  . pantoprazole  40 mg Oral Daily  . tamsulosin  0.4 mg Oral BID   Continuous Infusions:    Time spent on care of this patient: 35 min   Levi Heart, MD 10/22/2013, 7:42 AM  LOS: 3 days   [facility] Office  [phone] Pager - Text Page per Shea Evans   If 7PM-7AM, please contact night-coverage [email or web address]

## 2013-10-23 ENCOUNTER — Inpatient Hospital Stay (HOSPITAL_COMMUNITY): Payer: Medicare Other

## 2013-10-23 DIAGNOSIS — R109 Unspecified abdominal pain: Secondary | ICD-10-CM

## 2013-10-23 DIAGNOSIS — I452 Bifascicular block: Secondary | ICD-10-CM

## 2013-10-23 DIAGNOSIS — R102 Pelvic and perineal pain: Secondary | ICD-10-CM | POA: Diagnosis present

## 2013-10-23 LAB — CBC
HCT: 34.6 % — ABNORMAL LOW (ref 39.0–52.0)
HEMOGLOBIN: 11.5 g/dL — AB (ref 13.0–17.0)
MCH: 30.3 pg (ref 26.0–34.0)
MCHC: 33.2 g/dL (ref 30.0–36.0)
MCV: 91.3 fL (ref 78.0–100.0)
Platelets: 271 10*3/uL (ref 150–400)
RBC: 3.79 MIL/uL — ABNORMAL LOW (ref 4.22–5.81)
RDW: 13.6 % (ref 11.5–15.5)
WBC: 7.6 10*3/uL (ref 4.0–10.5)

## 2013-10-23 LAB — BASIC METABOLIC PANEL
Anion gap: 17 — ABNORMAL HIGH (ref 5–15)
BUN: 36 mg/dL — AB (ref 6–23)
CHLORIDE: 100 meq/L (ref 96–112)
CO2: 21 mEq/L (ref 19–32)
Calcium: 8.6 mg/dL (ref 8.4–10.5)
Creatinine, Ser: 1.85 mg/dL — ABNORMAL HIGH (ref 0.50–1.35)
GFR calc Af Amer: 34 mL/min — ABNORMAL LOW (ref >90)
GFR, EST NON AFRICAN AMERICAN: 30 mL/min — AB (ref >90)
GLUCOSE: 120 mg/dL — AB (ref 70–99)
Potassium: 4 mEq/L (ref 3.7–5.3)
Sodium: 138 mEq/L (ref 137–147)

## 2013-10-23 MED ORDER — IOHEXOL 300 MG/ML  SOLN
25.0000 mL | INTRAMUSCULAR | Status: AC
  Administered 2013-10-23 (×2): 25 mL via ORAL

## 2013-10-23 MED ORDER — PHENAZOPYRIDINE HCL 100 MG PO TABS: 100.0000 mg | ORAL_TABLET | Freq: Three times a day (TID) | ORAL | Status: DC

## 2013-10-23 MED ORDER — POLYETHYLENE GLYCOL 3350 17 G PO PACK
17.0000 g | PACK | Freq: Every day | ORAL | Status: DC
Start: 2013-10-24 — End: 2013-10-23

## 2013-10-23 MED ORDER — AMIODARONE HCL 200 MG PO TABS
200.0000 mg | ORAL_TABLET | Freq: Every day | ORAL | Status: DC
  Administered 2013-10-24: 200 mg via ORAL
  Filled 2013-10-23: qty 1

## 2013-10-23 MED ORDER — INDOMETHACIN 50 MG PO CAPS
50.0000 mg | ORAL_CAPSULE | Freq: Once | ORAL | Status: AC
  Administered 2013-10-23: 50 mg via ORAL
  Filled 2013-10-23: qty 1

## 2013-10-23 MED ORDER — COLCHICINE 0.6 MG PO TABS
0.6000 mg | ORAL_TABLET | Freq: Every day | ORAL | Status: DC
  Administered 2013-10-23: 0.6 mg via ORAL
  Filled 2013-10-23 (×2): qty 1

## 2013-10-23 MED ORDER — SENNA 8.6 MG PO TABS
1.0000 | ORAL_TABLET | Freq: Every day | ORAL | Status: DC
  Filled 2013-10-23: qty 1

## 2013-10-23 MED ORDER — AMOXICILLIN-POT CLAVULANATE 500-125 MG PO TABS
1.0000 | ORAL_TABLET | Freq: Two times a day (BID) | ORAL | Status: DC
  Administered 2013-10-23 – 2013-10-24 (×3): 500 mg via ORAL
  Filled 2013-10-23 (×6): qty 1

## 2013-10-23 NOTE — Progress Notes (Signed)
Patient states he is not home O2 dependent.  RN weaned oxygen from 4L to 2 L of oxygen.  Current O2 sat = 96%. RN will continue to monitor. Shellee Milo, RN

## 2013-10-23 NOTE — Progress Notes (Signed)
Pt complained of left foot pain. Stated that the pain started today. Foot is reddened and warm. Has taken medication in the past for gout flare ups.  Page MD Doree Barthel, and orders were given for gout medication.

## 2013-10-23 NOTE — Progress Notes (Signed)
Pt has received medication for gout/foot pain. Pt states that pain is better.

## 2013-10-23 NOTE — Progress Notes (Signed)
Physical Therapy Treatment Patient Details Name: Levi Lewis MRN: [id number] DOB: [birth year] Today's Date: 10/24/13    History of Present Illness Pt is a 80 y/o male admitted presents with fever on 102, chills, cough with yellow sputum, dyspnea and hypoxia with pulse ox of 88%. Had a DC cardioversion for A-fib on 7/2.    PT Comments    Pt progressing towards physical therapy goals. Session limited today due to gout pain in bilateral ankles (L more painful than R). Weightbearing activity was deferred and session focused on bilateral LE strengthening exercises. Pt anticipates d/c home tomorrow. Will continue to follow and progress as able per POC.   Follow Up Recommendations  Home health PT;Supervision for mobility/OOB     Equipment Recommendations  Rolling walker with 5" wheels    Recommendations for Other Services       Precautions / Restrictions Precautions Precautions: Fall Restrictions Weight Bearing Restrictions: No    Mobility  Bed Mobility               General bed mobility comments: Pt received sitting in recliner.   Transfers                 General transfer comment: Transfers deferred this session due to gout pain in ankles.   Ambulation/Gait                 Stairs            Wheelchair Mobility    Modified Rankin (Stroke Patients Only)       Balance                                    Cognition Arousal/Alertness: Awake/alert Behavior During Therapy: WFL for tasks assessed/performed Overall Cognitive Status: Within Functional Limits for tasks assessed                      Exercises General Exercises - Lower Extremity Ankle Circles/Pumps: 10 reps Quad Sets: 10 reps Gluteal Sets: 10 reps Long Arc Quad: 10 reps Heel Slides: 10 reps Hip ABduction/ADduction: 10 reps Straight Leg Raises: 10 reps    General Comments        Pertinent Vitals/Pain Vitals stable throughout session.     Home  Living                      Prior Function            PT Goals (current goals can now be found in the care plan section) Acute Rehab PT Goals Patient Stated Goal: To get stronger PT Goal Formulation: With patient/family Time For Goal Achievement: 11/05/13 Potential to Achieve Goals: Good Progress towards PT goals: Progressing toward goals    Frequency  Min 3X/week    PT Plan Current plan remains appropriate    Co-evaluation             End of Session Equipment Utilized During Treatment: Gait belt Activity Tolerance: Patient tolerated treatment well Patient left: in chair;with call bell/phone within reach;with family/visitor present     Time: 2458-0998 PT Time Calculation (min): 23 min  Charges:  $Therapeutic Exercise: 23-37 mins                    G Codes:      Jolyn Lent 10-24-2013, 4:43 PM  Jolyn Lent, PT, DPT Acute Rehabilitation Services Pager:  [phone]    

## 2013-10-23 NOTE — Progress Notes (Signed)
SUBJECTIVE:  Denies chest pain or SOB  OBJECTIVE:   Vitals:   Filed Vitals:   10/23/13 0422 10/23/13 0600 10/23/13 0825 10/23/13 0845  BP: 93/45 97/49 109/66   Pulse: 70 70 76 76  Temp: 98 F (36.7 C)  97.7 F (36.5 C)   TempSrc: Oral  Oral   Resp: 30 22 18 12   Height:      Weight: 180 lb 12.4 oz (82 kg)     SpO2: 90% 94% 98% 97%   I&O's:   Intake/Output Summary (Last 24 hours) at 10/23/13 1006 Last data filed at 10/23/13 0900  Gross per 24 hour  Intake    760 ml  Output    200 ml  Net    560 ml   TELEMETRY: Reviewed telemetry pt in AV paced rhythm:     PHYSICAL EXAM General: Well developed, well nourished, in no acute distress Head: Eyes PERRLA, No xanthomas.   Normal cephalic and atramatic  Lungs:   Clear bilaterally to auscultation and percussion. Heart:   HRRR S1 S2 Pulses are 2+ & equal. Abdomen: Bowel sounds are positive, abdomen soft and non-tender without masses Extremities:   No clubbing, cyanosis or edema.  DP +1 Neuro: Alert and oriented X 3. Psych:  Good affect, responds appropriately   LABS: Basic Metabolic Panel:  Recent Labs  10/22/13 0305 10/23/13 0424  NA 142 138  K 4.2 4.0  CL 102 100  CO2 22 21  GLUCOSE 119* 120*  BUN 39* 36*  CREATININE 2.08* 1.85*  CALCIUM 8.7 8.6   Liver Function Tests: No results found for this basename: AST, ALT, ALKPHOS, BILITOT, PROT, ALBUMIN,  in the last 72 hours No results found for this basename: LIPASE, AMYLASE,  in the last 72 hours CBC:  Recent Labs  10/21/13 0333 10/23/13 0424  WBC 8.8 7.6  HGB 11.9* 11.5*  HCT 36.1* 34.6*  MCV 92.3 91.3  PLT 229 271   Cardiac Enzymes: No results found for this basename: CKTOTAL, CKMB, CKMBINDEX, TROPONINI,  in the last 72 hours BNP: No components found with this basename: POCBNP,  D-Dimer: No results found for this basename: DDIMER,  in the last 72 hours Hemoglobin A1C: No results found for this basename: HGBA1C,  in the last 72 hours Fasting Lipid  Panel: No results found for this basename: CHOL, HDL, LDLCALC, TRIG, CHOLHDL, LDLDIRECT,  in the last 72 hours Thyroid Function Tests:  Recent Labs  10/21/13 0333  TSH 10.010*   Anemia Panel: No results found for this basename: VITAMINB12, FOLATE, FERRITIN, TIBC, IRON, RETICCTPCT,  in the last 72 hours Coag Panel:   Lab Results  Component Value Date   INR 0.98 09/30/2010   INR 1.0 08/06/2008    RADIOLOGY: Dg Chest Port 1 View  10/22/2013   CLINICAL DATA:  Shortness of Breath  EXAM: PORTABLE CHEST - 1 VIEW  COMPARISON:  October 19, 2013  FINDINGS: There is underlying emphysema. There is patchy consolidation in both lung bases, new. There is mild interstitial edema. Heart is enlarged with pulmonary venous hypertension. Pacemaker leads are attached to the right atrium right ventricle. No pneumothorax. There is superior migration of both humeral heads consistent with chronic rotator cuff tears.  IMPRESSION: New patchy airspace consolidation in the bases. There is cardiomegaly with mild interstitial edema and mild pulmonary venous hypertension consistent with a degree of congestive heart failure. The areas of airspace consolidation the bases could represent either edema or superimposed pneumonia. No pneumothorax. Chronic rotator cuff  tears bilaterally.   Electronically Signed   By: Lowella Grip M.D.   On: 10/22/2013 08:25   Dg Chest Port 1 View  10/19/2013   CLINICAL DATA:  Fever. Cough. Chest congestion. Biventricular pacemaker placed approximately 2-3 months ago.  EXAM: PORTABLE CHEST - 1 VIEW  COMPARISON:  Portable chest x-ray 10/17/2013. Two-view chest x-ray 07/16/2013, 07/03/2013, 09/28/2012.  FINDINGS: Left subclavian biventricular pacemaker unchanged and appears intact. Cardiac silhouette moderately enlarged but stable. Pulmonary venous hypertension and mild diffuse interstitial pulmonary edema, asymmetric and increased in the right lung, increased since the examination 2 days ago. Bilateral  pleural effusions, right greater than left, also increased. No confluent airspace consolidation.  IMPRESSION: 1. Developing mild CHF, with stable cardiomegaly and mild diffuse interstitial pulmonary edema, asymmetric and increased on the right, worse than the examination 2 days ago. 2. Small bilateral pleural effusions, right greater than left, increased in size since 2 days ago.   Electronically Signed   By: Evangeline Dakin M.D.   On: 10/19/2013 21:08   Dg Chest Port 1 View  10/17/2013   CLINICAL DATA:  Postop bronchoscopy  EXAM: PORTABLE CHEST - 1 VIEW  COMPARISON:  07/16/2013  FINDINGS: Stable mild cardiac enlargement. Bilateral lower lobe atelectasis. No pneumothorax or consolidation.  IMPRESSION: No acute abnormality   Electronically Signed   By: Skipper Cliche M.D.   On: 10/17/2013 13:26   Impression/Recommendations  1. Acute on chronic systolic heart failure  - CXR with mild pulm edema, worsened pleural effusions from prior films. Exacerbating factor could potentially be respiratory tract infection. Elevated BNP with evidence of volume overload on exam. He is 4.8L negative and 3lbs down from admit - Continue PO Lasix/BB. No ACE I due to underlying renal insuff  2. Hypoxemia  - multifactorial with fluid overload, fever and initially elevated WBC with productive cough suggestive of pneumonia. With recent DCCV risk of aspiration pneumonia vs pneumonitis  - on abx per primary team, cultures pending.  3. Afib  - recent DCCV 10/17/13, remains on amio, coreg,and eliquis.  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  Will sign off. Call with any problems  Sueanne Margarita, MD  10/23/2013  10:06 AM

## 2013-10-23 NOTE — Progress Notes (Addendum)
Chart reviewed.  [facility] Progress Note   Levi Lewis AJO:[id number] DOB: 09/17/1917 DOA: 10/19/2013 PCP: Criselda Peaches, MD  Brief narrative: Levi Lewis is a 80 y.o. male  presents with fever on 102, chills, cough with yellow sputum, dyspnea and hypoxia with pulse ox of 88%. Had a DC cardioversion for A-fib on 7/2 and per Dr Bensimhon's note from 7/4, he may have aspirated.   Assessment/Plan: Principal Problem:   Sepsis/ acute resp failure Resolving. Now on 2 L [place]. Wean oxygen. Transfer to tele.  Acute resp distress (a) HCAP- - likely aspiration pneumonia, present on admission: improving. Change to augmentin  (b) Acute on chronic systolic CHF-  Cardiomyopathy, EF by echo 25-30% Euvolemic.     Hypotension - cut back on Coreg from 6.25 to 3.125  Afib - s/p DCCV x 2 Dr. Radford Pax has changed amiodarone to once daily and signed off - on Eliquis    COPD?? 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 UA negative, but may be sterile from recent cipro.  Try pyridium.  Code Status: Do not intubate- discussed with patient and daughter Family Communication: daughter at bedside  Disposition Plan: home with PT 1-2 days  Consultants: Cardiology signed off  Procedures: none  Antibiotics: Anti-infectives   Start     Dose/Rate Route Frequency Ordered Stop   10/21/13 1000  ampicillin-sulbactam (UNASYN) 1.5 g in sodium chloride 0.9 % 50 mL IVPB     1.5 g 100 mL/hr over 30 Minutes Intravenous Every 12 hours 10/21/13 0831     10/20/13 2300  vancomycin (VANCOCIN) IVPB 1000 mg/200 mL premix  Status:  Discontinued     1,000 mg 200 mL/hr over 60 Minutes Intravenous Every 24 hours 10/19/13 2222 10/21/13 0812   10/20/13 0500  piperacillin-tazobactam (ZOSYN) IVPB 3.375 g  Status:  Discontinued     3.375 g 12.5 mL/hr over 240 Minutes Intravenous 3 times per day 10/19/13 2224 10/21/13 0830    10/19/13 2300  vancomycin (VANCOCIN) 1,500 mg in sodium chloride 0.9 % 500 mL IVPB     1,500 mg 250 mL/hr over 120 Minutes Intravenous  Once 10/19/13 2222 10/20/13 0103   10/19/13 2300  piperacillin-tazobactam (ZOSYN) IVPB 3.375 g     3.375 g 100 mL/hr over 30 Minutes Intravenous  Once 10/19/13 2222 10/19/13 2343   10/19/13 0500  piperacillin-tazobactam (ZOSYN) IVPB 3.375 g  Status:  Discontinued     3.375 g 12.5 mL/hr over 240 Minutes Intravenous Every 8 hours 10/19/13 2222 10/19/13 2224       DVT prophylaxis: Apixaban   Subjective: Breathing fine. C/o suprapubic pain and pressure for a month. Had 10 day course cipro prior to admission. Constipated. Has urinary hesitancy, poor stream, sensation of incomplete voiding  Objective: Filed Weights   10/19/13 2359 10/22/13 0440 10/23/13 0422  Weight: 82.1 kg (181 lb) 82 kg (180 lb 12.4 oz) 82 kg (180 lb 12.4 oz)    Vitals Filed Vitals:   10/23/13 0422 10/23/13 0600 10/23/13 0825 10/23/13 0845  BP: 93/45 97/49 109/66   Pulse: 70 70 76 76  Temp: 98 F (36.7 C)  97.7 F (36.5 C)   TempSrc: Oral  Oral   Resp: 30 22 18 12   Height:      Weight: 82 kg (180 lb 12.4 oz)     SpO2: 90% 94% 98% 97%      Intake/Output Summary (Last  24 hours) at 10/23/13 1021 Last data filed at 10/23/13 0900  Gross per 24 hour  Intake    760 ml  Output    200 ml  Net    560 ml     Exam: General:  no resp distress, alert and oriented Lungs: CTA without WRR Cardiovascular: Regular rate and rhythm without murmur gallop - paced rhythm  Abdomen: soft, lower abdomen/sp tender. Bowel sounds present Extremities: No significant cyanosis, clubbing no edema bilateral lower extremities  Data Reviewed: Basic Metabolic Panel:  Recent Labs Lab 10/19/13 2100 10/20/13 0303 10/21/13 0333 10/22/13 0305 10/23/13 0424  NA 137 141 140 142 138  K 3.8 3.6* 3.8 4.2 4.0  CL 100 104 102 102 100  CO2 18* 21 23 22 21   GLUCOSE 124* 113* 110* 119* 120*   BUN 29* 28* 30* 39* 36*  CREATININE 1.99* 1.83* 1.80* 2.08* 1.85*  CALCIUM 8.3* 8.4 8.7 8.7 8.6   Liver Function Tests:  Recent Labs Lab 10/19/13 2100  AST 12  ALT 7  ALKPHOS 51  BILITOT 1.4*  PROT 6.7  ALBUMIN 3.4*    Recent Labs Lab 10/19/13 2100  LIPASE 28   No results found for this basename: AMMONIA,  in the last 168 hours CBC:  Recent Labs Lab 10/19/13 2100 10/20/13 0303 10/21/13 0333 10/23/13 0424  WBC 10.8* 9.2 8.8 7.6  NEUTROABS 8.1*  --   --   --   HGB 12.4* 12.0* 11.9* 11.5*  HCT 36.9* 36.0* 36.1* 34.6*  MCV 90.9 91.6 92.3 91.3  PLT 216 214 229 271   Cardiac Enzymes:  Recent Labs Lab 10/19/13 2150  TROPONINI <0.30   BNP (last 3 results)  Recent Labs  07/12/13 0155 10/19/13 2150  PROBNP 5240.0* 7890.0*   CBG: No results found for this basename: GLUCAP,  in the last 168 hours  Recent Results (from the past 240 hour(s))  CULTURE, BLOOD (ROUTINE X 2)     Status: None   Collection Time    10/19/13  8:50 PM      Result Value Ref Range Status   Specimen Description BLOOD LEFT ARM   Final   Special Requests BOTTLES DRAWN AEROBIC AND ANAEROBIC 8CC EA   Final   Culture  Setup Time     Final   Value: 10/20/2013 02:09     Performed at [facility]   Culture     Final   Value:        BLOOD CULTURE RECEIVED NO GROWTH TO DATE CULTURE WILL BE HELD FOR 5 DAYS BEFORE ISSUING A FINAL NEGATIVE REPORT     Performed at [facility]   Report Status PENDING   Incomplete  CULTURE, BLOOD (ROUTINE X 2)     Status: None   Collection Time    10/19/13  8:55 PM      Result Value Ref Range Status   Specimen Description BLOOD RIGHT ARM   Final   Special Requests BOTTLES DRAWN AEROBIC AND ANAEROBIC 5CC EA   Final   Culture  Setup Time     Final   Value: 10/20/2013 02:09     Performed at [facility]   Culture     Final   Value:        BLOOD CULTURE RECEIVED NO GROWTH TO DATE CULTURE WILL BE HELD FOR 5 DAYS BEFORE ISSUING A FINAL  NEGATIVE REPORT     Performed at [facility]   Report Status PENDING  Incomplete  MRSA PCR SCREENING     Status: None   Collection Time    10/20/13 12:24 AM      Result Value Ref Range Status   MRSA by PCR NEGATIVE  NEGATIVE Final   Comment:            The GeneXpert MRSA Assay (FDA     approved for NASAL specimens     only), is one component of a     comprehensive MRSA colonization     surveillance program. It is not     intended to diagnose MRSA     infection nor to guide or     monitor treatment for     MRSA infections.  CULTURE, EXPECTORATED SPUTUM-ASSESSMENT     Status: None   Collection Time    10/22/13 11:10 AM      Result Value Ref Range Status   Specimen Description SPUTUM   Final   Special Requests Normal   Final   Sputum evaluation     Final   Value: MICROSCOPIC FINDINGS SUGGEST THAT THIS SPECIMEN IS NOT REPRESENTATIVE OF LOWER RESPIRATORY SECRETIONS. PLEASE RECOLLECT.     Gram Stain Report Called to,Read Back By and Verified With: Ella RN 11:40 10/22/13 ([email or web address])   Report Status 10/22/2013 FINAL   Final     Studies:  Recent x-ray studies have been reviewed in detail by the Attending Physician  Scheduled Meds:  Scheduled Meds: . [START ON 10/24/2013] amiodarone  200 mg Oral Daily  . ampicillin-sulbactam (UNASYN) IV  1.5 g Intravenous Q12H  . apixaban  2.5 mg Oral BID  . beta carotene w/minerals  1 tablet Oral Daily  . carvedilol  3.125 mg Oral BID WC  . furosemide  40 mg Oral Daily  . ipratropium-albuterol  3 mL Nebulization TID  . pantoprazole  40 mg Oral Daily  . polyethylene glycol  17 g Oral BID  . tamsulosin  0.4 mg Oral BID   Continuous Infusions:    Time spent on care of this patient: 35 min   [place], MD 10/23/2013, 10:21 AM  LOS: 4 days   [facility] [phone] If 7PM-7AM, please contact night-coverage [email or web address]

## 2013-10-23 NOTE — Progress Notes (Addendum)
PVR 225. Also c/o gout pain in left foot. Tender about midfoot.  Cant get pyridium due to low creatinine clearance. Will give a dose of colchicine, indocin x1 and CT pelvis.  D/w Dr. Nyoka Cowden.   Doree Barthel, MD

## 2013-10-23 NOTE — Progress Notes (Signed)
Prt prepared for transfer, daughter at bedisde, report given. Pt will transfer by wheelchair to [address] room 13. Litzy Dicker M. Dalbert Batman, RN, BSN 10/23/2013 11:19 AM

## 2013-10-24 MED ORDER — ALBUTEROL SULFATE (2.5 MG/3ML) 0.083% IN NEBU: 2.5000 mg | INHALATION_SOLUTION | Freq: Four times a day (QID) | RESPIRATORY_TRACT | Status: DC | PRN

## 2013-10-24 MED ORDER — COLCHICINE 0.6 MG PO TABS
0.6000 mg | ORAL_TABLET | Freq: Every day | ORAL | Status: DC
  Administered 2013-10-24: 0.6 mg via ORAL
  Filled 2013-10-24: qty 1

## 2013-10-24 MED ORDER — TRIAZOLAM 0.25 MG PO TABS: 0.1250 mg | ORAL_TABLET | Freq: Every evening | ORAL | Status: AC | PRN

## 2013-10-24 MED ORDER — AMOXICILLIN-POT CLAVULANATE 500-125 MG PO TABS: 1.0000 | ORAL_TABLET | Freq: Two times a day (BID) | ORAL | Status: DC

## 2013-10-24 MED ORDER — LEVOTHYROXINE SODIUM 25 MCG PO TABS: 25.0000 ug | ORAL_TABLET | Freq: Every day | ORAL | Status: DC

## 2013-10-24 MED ORDER — IPRATROPIUM-ALBUTEROL 0.5-2.5 (3) MG/3ML IN SOLN: 3.0000 mL | Freq: Two times a day (BID) | RESPIRATORY_TRACT | Status: DC

## 2013-10-24 MED ORDER — CARVEDILOL 3.125 MG PO TABS: 3.1250 mg | ORAL_TABLET | Freq: Two times a day (BID) | ORAL | Status: DC

## 2013-10-24 MED ORDER — AMIODARONE HCL 200 MG PO TABS: 200.0000 mg | ORAL_TABLET | Freq: Every day | ORAL | Status: DC

## 2013-10-24 MED ORDER — INDOMETHACIN 50 MG PO CAPS
50.0000 mg | ORAL_CAPSULE | Freq: Once | ORAL | Status: AC
  Administered 2013-10-24: 50 mg via ORAL
  Filled 2013-10-24: qty 1

## 2013-10-24 NOTE — Progress Notes (Signed)
RT note: Patient has BBS Clear/diminished.  Was ambulatory and oriented.  SPO2 on room air 92%. Treatments scheduled  Duoneb BID with Albuterol PRN per protocol.  RT will continue to monitor.

## 2013-10-24 NOTE — Progress Notes (Signed)
Patient slowly weaned off oxygen throughout night.  Current O2 sat = 96% on RA. RN will continue to monitor. Shellee Milo, RN

## 2013-10-24 NOTE — Progress Notes (Signed)
Pt ambulated in hallway 100 ft. saats stayed between 92-93%. No complaints of discomfort noted.

## 2013-10-24 NOTE — Progress Notes (Signed)
Discharge teaching completed. Medication list reviewed with daughter and patient. No concerns voiced.

## 2013-10-24 NOTE — Discharge Summary (Signed)
Physician Discharge Summary  Levi Lewis FWY:[id number] DOB: June 24, 1917 DOA: 10/19/2013  PCP: Criselda Peaches, MD  Admit date: 10/19/2013 Discharge date: 10/24/2013  Time spent: greater than 30 minutes  Recommendations for Outpatient Follow-up:  1. Home PT arranged  Discharge Diagnoses:  Principal Problem:   Sepsis Active Problems:   CAD (coronary artery disease)   Dyslipidemia   Atrial fibrillation   CKD (chronic kidney disease) stage 4, GFR 15-29 ml/min   Cardiomyopathy, EF by echo 27-74%   Chronic systolic CHF (congestive heart failure)   Acute respiratory failure   Hypotension   HCAP (healthcare-associated pneumonia)   Acute on chronic systolic heart failure   Suprapubic pain Acute gout of left foot, resolved  Discharge Condition: stable  Filed Weights   10/23/13 0422 10/23/13 1153 10/24/13 0520  Weight: 82 kg (180 lb 12.4 oz) 79.1 kg (174 lb 6.1 oz) 78.427 kg (172 lb 14.4 oz)    History of present illness:  80 y.o. male with Multiple Medical Problems including Cardiomyopathy/Chronic CHF with an EF=25%, and Atrial Fibrillation on Amiodarone and Eliquis Rx who presents to the ED with complaints of fevers and chills, and Cough productive of yellow sputum and SOB x 1 day. He had a fever to 102.1 today and in the ED on presentation he was hypoxic with O2 sats to 88%. His Chest X-ray revealed Bilateral Pleural Effusion R>L.   Hospital Course:  Patient was admitted to the step down unit and started on IV diuresis, broad-spectrum antibiotics, BiPAP. Cardiology consulted.  Sepsis Started on broad-spectrum antibiotics. Blood pressure remained stable and did not require vasopressors.  Acute resp failure  (a) HCAP- - likely aspiration- initially started on vancomycin and Zosyn. Subsequently narrowed to Unasyn. His respiratory status improved, transitioned to Augmentin. We'll give a few more days of Augmentin to complete an eight-day total course of antibiotics.  (b) Acute on  chronic systolic CHF- Cardiomyopathy, EF by echo 25-30%  - started IV Lasix at 80 BID- with good diuresis. Creatinine increased to above 2 and Lasix was changed to home dose. Cardiology consulted and made recommendations with medication changes. By discharge, off oxygen and euvolemic. Creatinine has been stable.  Hypotension  - cut back on Coreg from 6.25 to 3.125   Afib  - s/p DCCV x 2 prior to admission Remained in sinus rhythm. 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 By discharge had stabilized at about baseline of 1.8.  Had acute gout attack left midfoot. Give 2 doses of colchicine and 2 doses indomethacin with resolution of pain.  Daughter reports suprapubic pain for about a month. Recently on cipro for 10 days for presumed prostatitis, without relief. PVR 250 cc, so may be a component of retention.  Have recommended outpatient f/u with urology.  Also, got CT pelvis which showed enlarged prostate but no other findings.   Procedures:  BIPAP  Consultations:  cardiology  Discharge Exam: Filed Vitals:   10/24/13 0520  BP: 103/52  Pulse: 72  Temp: 98 F (36.7 C)  Resp: 16    General: comfortable. Off oxygen. Walking around in room Cardiovascular: RRR without WRR Respiratory: CTA without WRR Ext no CCE. Left foot nontender no erythema or warmth   Discharge Instructions   Diet - low sodium heart healthy    Complete by:  As directed      Discharge instructions    Complete by:  As directed   Decrease carvedilol to 3.125 mg twice daily. Change amiodarone to once daily.     Increase activity slowly    Complete by:  As directed      Walker     Complete by:  As directed             Medication List         acetaminophen 325 MG tablet  Commonly known as:  TYLENOL  Take 650 mg by mouth every 6 (six) hours as needed for fever.     amiodarone 200 MG tablet  Commonly  known as:  PACERONE  Take 1 tablet (200 mg total) by mouth daily.     amoxicillin-clavulanate 500-125 MG per tablet  Commonly known as:  AUGMENTIN  Take 1 tablet (500 mg total) by mouth 2 (two) times daily.     apixaban 2.5 MG Tabs tablet  Commonly known as:  ELIQUIS  Take 1 tablet (2.5 mg total) by mouth 2 (two) times daily.     carvedilol 3.125 MG tablet  Commonly known as:  COREG  Take 1 tablet (3.125 mg total) by mouth 2 (two) times daily with a meal.     fenofibrate 48 MG tablet  Commonly known as:  TRICOR  Take 0.5 tablets (24 mg total) by mouth daily.     fish oil-omega-3 fatty acids 1000 MG capsule  Take 2 g by mouth daily.     furosemide 40 MG tablet  Commonly known as:  LASIX  Take 1 tablet (40 mg total) by mouth 2 (two) times daily.     levothyroxine 25 MCG tablet  Commonly known as:  LEVOTHROID  Take 1 tablet (25 mcg total) by mouth daily before breakfast.     OCUVITE PO  Take 2 tablets by mouth daily.     omeprazole 20 MG capsule  Commonly known as:  PRILOSEC  Take 20 mg by mouth daily.     tamsulosin 0.4 MG Caps capsule  Commonly known as:  FLOMAX  Take 0.4 mg by mouth 2 (two) times daily.     triazolam 0.25 MG tablet  Commonly known as:  HALCION  Take 0.5 tablets (0.125 mg total) by mouth at bedtime as needed for sleep.       No Known Allergies     Follow-up Information   Follow up with [facility], Keenan Bachelor, MD.   Specialty:  Internal Medicine   Contact information:   [address] [place] [address] [phone]       Follow up with [facility]. (for prostate problems)    Contact information:   [address] [place] [address] [phone]       The results of significant diagnostics from this hospitalization (including imaging, microbiology, ancillary and laboratory) are listed below for reference.    Significant Diagnostic Studies: Ct Pelvis Wo Contrast  10/23/2013   CLINICAL DATA:   Suprapubic and rectal pain.  EXAM: CT PELVIS WITHOUT CONTRAST  TECHNIQUE: Multidetector CT imaging of the pelvis was performed following the standard protocol without intravenous contrast.  COMPARISON:  05/11/2009  FINDINGS: Patient may have an extrarenal pelvis on the right side. There is no evidence for ureter stones. No significant free fluid or lymphadenopathy. There is fluid in the urinary bladder. The prostate is prominent for size measuring up to 5.6 cm in diameter. No significant free fluid or lymphadenopathy. Diverticulosis in the sigmoid colon without surrounding inflammatory changes. There is  contrast in the small bowel without dilatation. Motion artifact in the mid abdomen.  Both hips are located. No acute bone abnormality. Multilevel facet disease in the lumbar spine and patient has had laminectomy procedures. Stable degenerative changes in the lower lumbar spine.  IMPRESSION: No acute abnormality within the pelvis.  Diverticulosis without acute inflammation.  Prostate enlargement.   Electronically Signed   By: Markus Daft M.D.   On: 10/23/2013 17:49   Dg Chest Port 1 View  10/22/2013   CLINICAL DATA:  Shortness of Breath  EXAM: PORTABLE CHEST - 1 VIEW  COMPARISON:  October 19, 2013  FINDINGS: There is underlying emphysema. There is patchy consolidation in both lung bases, new. There is mild interstitial edema. Heart is enlarged with pulmonary venous hypertension. Pacemaker leads are attached to the right atrium right ventricle. No pneumothorax. There is superior migration of both humeral heads consistent with chronic rotator cuff tears.  IMPRESSION: New patchy airspace consolidation in the bases. There is cardiomegaly with mild interstitial edema and mild pulmonary venous hypertension consistent with a degree of congestive heart failure. The areas of airspace consolidation the bases could represent either edema or superimposed pneumonia. No pneumothorax. Chronic rotator cuff tears bilaterally.    Electronically Signed   By: Lowella Grip M.D.   On: 10/22/2013 08:25   Dg Chest Port 1 View  10/19/2013   CLINICAL DATA:  Fever. Cough. Chest congestion. Biventricular pacemaker placed approximately 2-3 months ago.  EXAM: PORTABLE CHEST - 1 VIEW  COMPARISON:  Portable chest x-ray 10/17/2013. Two-view chest x-ray 07/16/2013, 07/03/2013, 09/28/2012.  FINDINGS: Left subclavian biventricular pacemaker unchanged and appears intact. Cardiac silhouette moderately enlarged but stable. Pulmonary venous hypertension and mild diffuse interstitial pulmonary edema, asymmetric and increased in the right lung, increased since the examination 2 days ago. Bilateral pleural effusions, right greater than left, also increased. No confluent airspace consolidation.  IMPRESSION: 1. Developing mild CHF, with stable cardiomegaly and mild diffuse interstitial pulmonary edema, asymmetric and increased on the right, worse than the examination 2 days ago. 2. Small bilateral pleural effusions, right greater than left, increased in size since 2 days ago.   Electronically Signed   By: Evangeline Dakin M.D.   On: 10/19/2013 21:08   Dg Chest Port 1 View  10/17/2013   CLINICAL DATA:  Postop bronchoscopy  EXAM: PORTABLE CHEST - 1 VIEW  COMPARISON:  07/16/2013  FINDINGS: Stable mild cardiac enlargement. Bilateral lower lobe atelectasis. No pneumothorax or consolidation.  IMPRESSION: No acute abnormality   Electronically Signed   By: Skipper Cliche M.D.   On: 10/17/2013 13:26   EKG: NSR with LBBB  Microbiology: Recent Results (from the past 240 hour(s))  CULTURE, BLOOD (ROUTINE X 2)     Status: None   Collection Time    10/19/13  8:50 PM      Result Value Ref Range Status   Specimen Description BLOOD LEFT ARM   Final   Special Requests BOTTLES DRAWN AEROBIC AND ANAEROBIC 8CC EA   Final   Culture  Setup Time     Final   Value: 10/20/2013 02:09     Performed at [facility]   Culture     Final   Value:        BLOOD  CULTURE RECEIVED NO GROWTH TO DATE CULTURE WILL BE HELD FOR 5 DAYS BEFORE ISSUING A FINAL NEGATIVE REPORT     Performed at [facility]   Report Status PENDING   Incomplete  CULTURE, BLOOD (  ROUTINE X 2)     Status: None   Collection Time    10/19/13  8:55 PM      Result Value Ref Range Status   Specimen Description BLOOD RIGHT ARM   Final   Special Requests BOTTLES DRAWN AEROBIC AND ANAEROBIC 5CC EA   Final   Culture  Setup Time     Final   Value: 10/20/2013 02:09     Performed at [facility]   Culture     Final   Value:        BLOOD CULTURE RECEIVED NO GROWTH TO DATE CULTURE WILL BE HELD FOR 5 DAYS BEFORE ISSUING A FINAL NEGATIVE REPORT     Performed at [facility]   Report Status PENDING   Incomplete  MRSA PCR SCREENING     Status: None   Collection Time    10/20/13 12:24 AM      Result Value Ref Range Status   MRSA by PCR NEGATIVE  NEGATIVE Final   Comment:            The GeneXpert MRSA Assay (FDA     approved for NASAL specimens     only), is one component of a     comprehensive MRSA colonization     surveillance program. It is not     intended to diagnose MRSA     infection nor to guide or     monitor treatment for     MRSA infections.  CULTURE, EXPECTORATED SPUTUM-ASSESSMENT     Status: None   Collection Time    10/22/13 11:10 AM      Result Value Ref Range Status   Specimen Description SPUTUM   Final   Special Requests Normal   Final   Sputum evaluation     Final   Value: MICROSCOPIC FINDINGS SUGGEST THAT THIS SPECIMEN IS NOT REPRESENTATIVE OF LOWER RESPIRATORY SECRETIONS. PLEASE RECOLLECT.     Gram Stain Report Called to,Read Back By and Verified With: Ella RN 11:40 10/22/13 ([email or web address])   Report Status 10/22/2013 FINAL   Final     Labs: Basic Metabolic Panel:  Recent Labs Lab 10/19/13 2100 10/20/13 0303 10/21/13 0333 10/22/13 0305 10/23/13 0424  NA 137 141 140 142 138  K 3.8 3.6* 3.8 4.2 4.0  CL 100 104 102 102 100  CO2 18* 21 23  22 21   GLUCOSE 124* 113* 110* 119* 120*  BUN 29* 28* 30* 39* 36*  CREATININE 1.99* 1.83* 1.80* 2.08* 1.85*  CALCIUM 8.3* 8.4 8.7 8.7 8.6   Liver Function Tests:  Recent Labs Lab 10/19/13 2100  AST 12  ALT 7  ALKPHOS 51  BILITOT 1.4*  PROT 6.7  ALBUMIN 3.4*    Recent Labs Lab 10/19/13 2100  LIPASE 28   No results found for this basename: AMMONIA,  in the last 168 hours CBC:  Recent Labs Lab 10/19/13 2100 10/20/13 0303 10/21/13 0333 10/23/13 0424  WBC 10.8* 9.2 8.8 7.6  NEUTROABS 8.1*  --   --   --   HGB 12.4* 12.0* 11.9* 11.5*  HCT 36.9* 36.0* 36.1* 34.6*  MCV 90.9 91.6 92.3 91.3  PLT 216 214 229 271   Cardiac Enzymes:  Recent Labs Lab 10/19/13 2150  TROPONINI <0.30   BNP: BNP (last 3 results)  Recent Labs  07/12/13 0155 10/19/13 2150  PROBNP 5240.0* 7890.0*   CBG: No results found for this basename: GLUCAP,  in the last 168 hours     Signed:  [place] L  [facility] 10/24/2013, 10:30 AM

## 2013-10-25 ENCOUNTER — Telehealth: Payer: Self-pay | Admitting: Cardiology

## 2013-10-25 NOTE — Telephone Encounter (Signed)
[facility] HF protocol.

## 2013-10-25 NOTE — Telephone Encounter (Signed)
New message    Patient just release from hospital for CHF. Do Dr. Ron Parker wants [facility].

## 2013-10-25 NOTE — Telephone Encounter (Signed)
Levi Lewis from [facility] called  . She said that pt was D/C from [facility] hospital, and  was referred to their care. Levi Lewis wants to Know if Dr. Ron Parker wants for pt to be  on the [facility] health HF protocol or be on the standard protocol. Levi Lewis is aware that this message will be send to MD for recommendations.

## 2013-10-26 LAB — CULTURE, BLOOD (ROUTINE X 2)
CULTURE: NO GROWTH
Culture: NO GROWTH

## 2013-10-29 NOTE — Telephone Encounter (Signed)
LMTCB

## 2013-10-30 ENCOUNTER — Encounter: Payer: Self-pay | Admitting: Internal Medicine

## 2013-10-31 NOTE — Telephone Encounter (Signed)
**Note De-Identified  Obfuscation** Levi Lewis is advised and she verbalized understanding.

## 2013-11-19 ENCOUNTER — Encounter: Payer: Self-pay | Admitting: Cardiology

## 2013-11-22 ENCOUNTER — Ambulatory Visit (INDEPENDENT_AMBULATORY_CARE_PROVIDER_SITE_OTHER): Payer: Medicare Other | Admitting: *Deleted

## 2013-11-22 ENCOUNTER — Encounter: Payer: Self-pay | Admitting: Cardiology

## 2013-11-22 ENCOUNTER — Ambulatory Visit (INDEPENDENT_AMBULATORY_CARE_PROVIDER_SITE_OTHER): Payer: Medicare Other | Admitting: Cardiology

## 2013-11-22 VITALS — BP 122/64 | HR 72 | Ht 71.0 in | Wt 180.8 lb

## 2013-11-22 DIAGNOSIS — I480 Paroxysmal atrial fibrillation: Secondary | ICD-10-CM

## 2013-11-22 DIAGNOSIS — Z950 Presence of cardiac pacemaker: Secondary | ICD-10-CM

## 2013-11-22 DIAGNOSIS — I428 Other cardiomyopathies: Secondary | ICD-10-CM

## 2013-11-22 DIAGNOSIS — I509 Heart failure, unspecified: Secondary | ICD-10-CM

## 2013-11-22 DIAGNOSIS — I2510 Atherosclerotic heart disease of native coronary artery without angina pectoris: Secondary | ICD-10-CM

## 2013-11-22 DIAGNOSIS — I4891 Unspecified atrial fibrillation: Secondary | ICD-10-CM

## 2013-11-22 DIAGNOSIS — Z79899 Other long term (current) drug therapy: Secondary | ICD-10-CM | POA: Insufficient documentation

## 2013-11-22 DIAGNOSIS — I429 Cardiomyopathy, unspecified: Secondary | ICD-10-CM

## 2013-11-22 DIAGNOSIS — I5022 Chronic systolic (congestive) heart failure: Secondary | ICD-10-CM

## 2013-11-22 LAB — MDC_IDC_ENUM_SESS_TYPE_INCLINIC
Battery Remaining Longevity: 86.4 mo
Battery Voltage: 2.99 V
Brady Statistic RA Percent Paced: 86 %
Implantable Pulse Generator Model: 3242
Implantable Pulse Generator Serial Number: 2978403
Lead Channel Impedance Value: 550 Ohm
Lead Channel Impedance Value: 750 Ohm
Lead Channel Pacing Threshold Amplitude: 1 V
Lead Channel Pacing Threshold Amplitude: 1.25 V
Lead Channel Pacing Threshold Pulse Width: 0.4 ms
Lead Channel Pacing Threshold Pulse Width: 0.4 ms
Lead Channel Sensing Intrinsic Amplitude: 12 mV
Lead Channel Setting Pacing Amplitude: 2 V
Lead Channel Setting Pacing Amplitude: 2.5 V
Lead Channel Setting Pacing Pulse Width: 0.4 ms
Lead Channel Setting Pacing Pulse Width: 0.4 ms
Lead Channel Setting Sensing Sensitivity: 2 mV
MDC IDC MSMT LEADCHNL LV PACING THRESHOLD AMPLITUDE: 1.375 V
MDC IDC MSMT LEADCHNL LV PACING THRESHOLD PULSEWIDTH: 0.4 ms
MDC IDC MSMT LEADCHNL RA IMPEDANCE VALUE: 487.5 Ohm
MDC IDC MSMT LEADCHNL RA PACING THRESHOLD AMPLITUDE: 1.25 V
MDC IDC MSMT LEADCHNL RA SENSING INTR AMPL: 0.5 mV
MDC IDC MSMT LEADCHNL RV PACING THRESHOLD PULSEWIDTH: 0.4 ms
MDC IDC SESS DTM: 20150807103113
MDC IDC SET LEADCHNL LV PACING AMPLITUDE: 2.375
MDC IDC STAT BRADY RV PERCENT PACED: 90 %

## 2013-11-22 LAB — BASIC METABOLIC PANEL
BUN: 27 mg/dL — ABNORMAL HIGH (ref 6–23)
CO2: 23 mEq/L (ref 19–32)
Calcium: 8.6 mg/dL (ref 8.4–10.5)
Chloride: 105 mEq/L (ref 96–112)
Creatinine, Ser: 1.7 mg/dL — ABNORMAL HIGH (ref 0.4–1.5)
GFR: 40.32 mL/min — AB (ref >60.00)
Glucose, Bld: 118 mg/dL — ABNORMAL HIGH (ref 70–99)
POTASSIUM: 4 meq/L (ref 3.5–5.1)
SODIUM: 138 meq/L (ref 135–145)

## 2013-11-22 NOTE — Assessment & Plan Note (Addendum)
At this time he is holding sinus rhythm. There will be further assessment of his interrogation strips. Amiodarone will be continued. When I see him next we will assess whether labs are needed for amiodarone management.  As part of today's evaluation I spent greater than 25 minutes with a total care. For this at this time is been with direct contact with him. We have discussed all of his symptoms and very carefully assess his current rhythm.

## 2013-11-22 NOTE — Assessment & Plan Note (Signed)
He'll be followed very carefully for his amiodarone therapy.

## 2013-11-22 NOTE — Assessment & Plan Note (Signed)
We presume that the patient has coronary disease. Catheterization has not been done. He stable. No change in therapy.

## 2013-11-22 NOTE — Assessment & Plan Note (Signed)
His volume status is under control. No change in therapy today.

## 2013-11-22 NOTE — Progress Notes (Signed)
Patient ID: Levi Lewis, male   DOB: [birth year], 80 y.o.   MRN: [id number]    HPI  Patient is here to followup his atrial arrhythmia and is congestive heart failure. After I saw him last the plan was for further EP evaluation. This was done and there was plan as for a cardioversion. He was cardioverted on October 17, 2013. He converted. Within several days he was admitted the hospital with respiratory illness. There was question of whether he could have aspiration. He stabilized and went home. He is now here for followup. He had been started on amiodarone several weeks before the cardioversion. He continues to take it at 200 mg daily. He is tolerating the medication without difficulty. He has some exertional shortness of breath. However overall he looks quite good and is stable. His cardioversion note in this hospitalization notes  As part of today's evaluation I have reviewed his cardioversion note and his hospital notes and the evaluation by Dr. Caryl Comes.  No Known Allergies  Current Outpatient Prescriptions  Medication Sig Dispense Refill  . acetaminophen (TYLENOL) 325 MG tablet Take 650 mg by mouth every 6 (six) hours as needed for fever.      Marland Kitchen amiodarone (PACERONE) 200 MG tablet Take 1 tablet (200 mg total) by mouth daily.  60 tablet  3  . amoxicillin-clavulanate (AUGMENTIN) 500-125 MG per tablet Take 1 tablet (500 mg total) by mouth 2 (two) times daily.  4 tablet  0  . apixaban (ELIQUIS) 2.5 MG TABS tablet Take 1 tablet (2.5 mg total) by mouth 2 (two) times daily.  60 tablet  6  . carvedilol (COREG) 3.125 MG tablet Take 1 tablet (3.125 mg total) by mouth 2 (two) times daily with a meal.  60 tablet  1  . fenofibrate (TRICOR) 48 MG tablet Take 0.5 tablets (24 mg total) by mouth daily.  45 tablet  3  . fish oil-omega-3 fatty acids 1000 MG capsule Take 2 g by mouth 2 (two) times daily.       . furosemide (LASIX) 40 MG tablet Take 1 tablet (40 mg total) by mouth 2 (two) times daily.  180 tablet  1  .  levothyroxine (LEVOTHROID) 25 MCG tablet Take 1 tablet (25 mcg total) by mouth daily before breakfast.  30 tablet  1  . Multiple Vitamins-Minerals (OCUVITE PO) Take 2 tablets by mouth daily.       Marland Kitchen omeprazole (PRILOSEC) 20 MG capsule Take 20 mg by mouth daily.       . tamsulosin (FLOMAX) 0.4 MG CAPS capsule Take 0.4 mg by mouth 2 (two) times daily.       . triazolam (HALCION) 0.25 MG tablet Take 0.5 tablets (0.125 mg total) by mouth at bedtime as needed for sleep.  15 tablet  0   No current facility-administered medications for this visit.    History   Social History  . Marital Status: Married    Spouse Name: N/A    Number of Children: N/A  . Years of Education: N/A   Occupational History  . Not on file.   Social History Main Topics  . Smoking status: Never Smoker   . Smokeless tobacco: Not on file  . Alcohol Use: No  . Drug Use: No  . Sexual Activity: Not Currently   Other Topics Concern  . Not on file   Social History Narrative   He has never smoked or drank. No illcit drug use . He is retired from the [employer]  and lives with his wife. Illicit Drug Use- no    Family History  Problem Relation Age of Onset  . Heart attack Sister   . Lung cancer Brother     smoker  . Colon cancer Neg Hx     Past Medical History  Diagnosis Date  . Left bundle branch block   . CAD (coronary artery disease)   . Hypertension   . Gout   . Dyslipidemia   . Lung nodules   . COPD (chronic obstructive pulmonary disease)   . Mitral regurgitation   . Aortic insufficiency   . Shoulder pain   . Back pain     Back and knee pain, June, 2014  . Cardiomyopathy, EF by echo 25-30% 07/14/2013  . RBBB   . Atrial fibrillation   . Pacemaker   . Shortness of breath   . Pneumonia   . Arthritis     Past Surgical History  Procedure Laterality Date  . Back surgery    . Right shoulder    . Right knee surgery    . Tonsillectomy    . Coronary angioplasty    . Bi-ventricular pacemaker  insertion (crt-p)  07-15-2013    STJ Quadra Allura CRTP implanted by Dr Caryl Comes for NICM, CHF, alternating bundle branch blocks  . Cardioversion N/A 09/13/2013    Procedure: CARDIOVERSION;  Surgeon: Carlena Bjornstad, MD;  Location[facility] ENDOSCOPY;  Service: Cardiovascular;  Laterality: N/A;  . Cardioversion N/A 10/17/2013    Procedure: CARDIOVERSION;  Surgeon: Lelon Perla, MD;  Location: [facility] ENDOSCOPY;  Service: Cardiovascular;  Laterality: N/A;    Patient Active Problem List   Diagnosis Date Noted  . Suprapubic pain 10/23/2013  . HCAP (healthcare-associated pneumonia) 10/20/2013  . Acute on chronic systolic heart failure 52/77/8242  . Sepsis 10/19/2013  . Hypotension 10/19/2013  . Fatigue 10/15/2013  . Multiple blisters 09/03/2013  . Hypothyroidism 08/05/2013  . Pacemaker 08/04/2013  . Chronic systolic CHF (congestive heart failure) 08/04/2013  . HX: anticoagulation 08/04/2013  . CKD (chronic kidney disease) stage 4, GFR 15-29 ml/min 07/14/2013  . Cardiomyopathy, EF by echo 25-30% 07/14/2013  . Atrial fibrillation 07/13/2013  . Alternating (bilateral) bundle branch block 07/13/2013  . Back pain   . Shoulder pain   . Left bundle branch block   . CAD (coronary artery disease)   . Hypertension   . Gout   . Dyslipidemia   . Ejection fraction   . Lung nodules   . COPD (chronic obstructive pulmonary disease)   . Mitral regurgitation   . Aortic insufficiency    Review of systems  patient denies fever, chills, headache, sweats, rash, change in vision, change in hearing, chest pain, cough, nausea or vomiting, urinary symptoms. All other systems are reviewed and are negative.    Physical examination  Patient is oriented to person time and place. Affect is normal. He's here with a family member. Head is atraumatic. Sclera and conjunctiva are normal. There is no jugulovenous distention. Lungs are clear. Respiratory effort is nonlabored. Cardiac exam reveals S1 and S2. The rhythm is regular.  Abdomen is soft. There is no significant peripheral edema. There no musculoskeletal deformities. There are no skin rashes.    Filed Vitals:   11/22/13 0804  BP: 122/64  Pulse: 72  Height: 5\' 11"  (1.803 m)  Weight: 180 lb 12.8 oz (82.01 kg)   The patient's pacemaker was interrogated very carefully. He does have underlying sinus rhythm. There will be further assessment of the  strips by the EP team to see if any further adjustments are needed.  ASSESSMENT & PLAN

## 2013-11-22 NOTE — Patient Instructions (Signed)
Your physician recommends that you continue on your current medications as directed. Please refer to the Current Medication list given to you today.  Your physician recommends that you return for lab work in: today  Your physician recommends that you schedule a follow-up appointment in: 4 weeks

## 2013-11-22 NOTE — Progress Notes (Signed)
Pacemaker check in clinic. Normal device function. Thresholds, sensing, impedances consistent with previous measurements.  Possible t-wave oversensing. Noise noted on the atrial lead that was not able to be reproduced.  All atrial measurements are stable.    Device programmed to maximize longevity.  48 a-fib episodes since DCCV 10/17/13.  No high ventricular rates noted. Device programmed at appropriate safety margins. Histogram distribution appropriate for patient activity level. Device programmed to optimize intrinsic conduction. Estimated longevity 7.2 years. Patient enrolled in remote follow-up/TTM's with [employer]. Plan to follow every 3 months remotely and see annually in office. Patient education completed.

## 2013-11-22 NOTE — Assessment & Plan Note (Signed)
His pacemaker parameters are being followed very carefully.

## 2013-12-16 ENCOUNTER — Encounter: Payer: Self-pay | Admitting: Internal Medicine

## 2013-12-17 ENCOUNTER — Encounter: Payer: Self-pay | Admitting: Internal Medicine

## 2013-12-20 ENCOUNTER — Encounter: Payer: Self-pay | Admitting: Cardiology

## 2013-12-20 ENCOUNTER — Ambulatory Visit (INDEPENDENT_AMBULATORY_CARE_PROVIDER_SITE_OTHER): Payer: Medicare Other | Admitting: Cardiology

## 2013-12-20 VITALS — BP 120/62 | HR 71 | Ht 71.0 in | Wt 178.0 lb

## 2013-12-20 DIAGNOSIS — Z9229 Personal history of other drug therapy: Secondary | ICD-10-CM

## 2013-12-20 DIAGNOSIS — I480 Paroxysmal atrial fibrillation: Secondary | ICD-10-CM

## 2013-12-20 DIAGNOSIS — I5022 Chronic systolic (congestive) heart failure: Secondary | ICD-10-CM

## 2013-12-20 DIAGNOSIS — I429 Cardiomyopathy, unspecified: Secondary | ICD-10-CM

## 2013-12-20 DIAGNOSIS — Z79899 Other long term (current) drug therapy: Secondary | ICD-10-CM

## 2013-12-20 DIAGNOSIS — I2510 Atherosclerotic heart disease of native coronary artery without angina pectoris: Secondary | ICD-10-CM

## 2013-12-20 DIAGNOSIS — I428 Other cardiomyopathies: Secondary | ICD-10-CM

## 2013-12-20 DIAGNOSIS — I509 Heart failure, unspecified: Secondary | ICD-10-CM

## 2013-12-20 DIAGNOSIS — Z7901 Long term (current) use of anticoagulants: Secondary | ICD-10-CM

## 2013-12-20 DIAGNOSIS — I4891 Unspecified atrial fibrillation: Secondary | ICD-10-CM

## 2013-12-20 NOTE — Assessment & Plan Note (Signed)
Patient will continue on anticoagulation.

## 2013-12-20 NOTE — Assessment & Plan Note (Signed)
Clinically the patient is doing great. At some point we may consider a followup 2-D echo. However this would not change therapy at this time. No testing at this point.

## 2013-12-20 NOTE — Assessment & Plan Note (Signed)
We have assumed coronary disease. He's not having any significant symptoms. No further workup.

## 2013-12-20 NOTE — Assessment & Plan Note (Signed)
His volume status is well controlled with his current medications. Sinus rhythm and his pacemaker appear to make very significant difference for him

## 2013-12-20 NOTE — Patient Instructions (Signed)
**Note De-identified  Obfuscation** Your physician recommends that you continue on your current medications as directed. Please refer to the Current Medication list given to you today.  Your physician recommends that you schedule a follow-up appointment in: 3 months  

## 2013-12-20 NOTE — Progress Notes (Signed)
Patient ID: Levi Lewis, male   DOB: November 19, 1917, 80 y.o.   MRN: [id number]    HPI  Patient returns today to followup his atrial fibrillation and CHF. He looks great. His weight is stable. His rhythm continues to be regular. He's not having any significant shortness of breath. He is not having any significant edema. Chemistry was checked when I saw him on August 7. His creatinine was stable for him and his potassium was normal.  No Known Allergies  Current Outpatient Prescriptions  Medication Sig Dispense Refill  . acetaminophen (TYLENOL) 325 MG tablet Take 650 mg by mouth every 6 (six) hours as needed for fever.      Marland Kitchen amiodarone (PACERONE) 200 MG tablet Take 1 tablet (200 mg total) by mouth daily.  60 tablet  3  . amoxicillin-clavulanate (AUGMENTIN) 500-125 MG per tablet Take 1 tablet by mouth daily.      Marland Kitchen apixaban (ELIQUIS) 2.5 MG TABS tablet Take 1 tablet (2.5 mg total) by mouth 2 (two) times daily.  60 tablet  6  . carvedilol (COREG) 3.125 MG tablet Take 1 tablet (3.125 mg total) by mouth 2 (two) times daily with a meal.  60 tablet  1  . fenofibrate (TRICOR) 48 MG tablet Take 0.5 tablets (24 mg total) by mouth daily.  45 tablet  3  . fish oil-omega-3 fatty acids 1000 MG capsule Take 2 g by mouth 2 (two) times daily.       . furosemide (LASIX) 40 MG tablet Take 1 tablet (40 mg total) by mouth 2 (two) times daily.  180 tablet  1  . levothyroxine (LEVOTHROID) 25 MCG tablet Take 1 tablet (25 mcg total) by mouth daily before breakfast.  30 tablet  1  . Multiple Vitamins-Minerals (OCUVITE PO) Take 2 tablets by mouth daily.       Marland Kitchen omeprazole (PRILOSEC) 20 MG capsule Take 20 mg by mouth daily.       . tamsulosin (FLOMAX) 0.4 MG CAPS capsule Take 0.4 mg by mouth 2 (two) times daily.       . triazolam (HALCION) 0.25 MG tablet Take 0.5 tablets (0.125 mg total) by mouth at bedtime as needed for sleep.  15 tablet  0   No current facility-administered medications for this visit.    History    Social History  . Marital Status: Married    Spouse Name: N/A    Number of Children: N/A  . Years of Education: N/A   Occupational History  . Not on file.   Social History Main Topics  . Smoking status: Never Smoker   . Smokeless tobacco: Not on file  . Alcohol Use: No  . Drug Use: No  . Sexual Activity: Not Currently   Other Topics Concern  . Not on file   Social History Narrative   He has never smoked or drank. No illcit drug use . He is retired from the [employer] and lives with his wife. Illicit Drug Use- no    Family History  Problem Relation Age of Onset  . Heart attack Sister   . Lung cancer Brother     smoker  . Colon cancer Neg Hx     Past Medical History  Diagnosis Date  . Left bundle branch block   . CAD (coronary artery disease)   . Hypertension   . Gout   . Dyslipidemia   . Lung nodules   . COPD (chronic obstructive pulmonary disease)   . Mitral regurgitation   .  Aortic insufficiency   . Shoulder pain   . Back pain     Back and knee pain, June, 2014  . Cardiomyopathy, EF by echo 25-30% 07/14/2013  . RBBB   . Atrial fibrillation   . Pacemaker   . Shortness of breath   . Pneumonia   . Arthritis     Past Surgical History  Procedure Laterality Date  . Back surgery    . Right shoulder    . Right knee surgery    . Tonsillectomy    . Coronary angioplasty    . Bi-ventricular pacemaker insertion (crt-p)  07-15-2013    STJ Quadra Allura CRTP implanted by Dr Caryl Comes for NICM, CHF, alternating bundle branch blocks  . Cardioversion N/A 09/13/2013    Procedure: CARDIOVERSION;  Surgeon: Carlena Bjornstad, MD;  Location: [facility] ENDOSCOPY;  Service: Cardiovascular;  Laterality: N/A;  . Cardioversion N/A 10/17/2013    Procedure: CARDIOVERSION;  Surgeon: Lelon Perla, MD;  Location: [facility] ENDOSCOPY;  Service: Cardiovascular;  Laterality: N/A;    Patient Active Problem List   Diagnosis Date Noted  . On amiodarone therapy 11/22/2013  . Suprapubic pain  10/23/2013  . HCAP (healthcare-associated pneumonia) 10/20/2013  . Sepsis 10/19/2013  . Hypotension 10/19/2013  . Fatigue 10/15/2013  . Multiple blisters 09/03/2013  . Hypothyroidism 08/05/2013  . Pacemaker 08/04/2013  . Chronic systolic CHF (congestive heart failure) 08/04/2013  . HX: anticoagulation 08/04/2013  . CKD (chronic kidney disease) stage 4, GFR 15-29 ml/min 07/14/2013  . Cardiomyopathy, EF by echo 25-30% 07/14/2013  . Atrial fibrillation 07/13/2013  . Alternating (bilateral) bundle branch block 07/13/2013  . Back pain   . Shoulder pain   . Left bundle branch block   . CAD (coronary artery disease)   . Hypertension   . Gout   . Dyslipidemia   . Ejection fraction   . Lung nodules   . COPD (chronic obstructive pulmonary disease)   . Mitral regurgitation   . Aortic insufficiency     ROS   Patient denies fever, chills, headache, sweats, rash, change in vision, change in hearing, chest pain, cough, nausea or vomiting, urinary symptoms. All other systems are reviewed and are negative.  PHYSICAL EXAM  Patient is oriented to person time and place. Affect is normal. He's here with his family member. Head is atraumatic. Sclera and conjunctiva are normal. There is no jugulovenous distention. Lungs reveal a few scattered rhonchi at the right base posteriorly. There is no respiratory distress. Cardiac exam reveals S1 and S2. The rhythm is regular. Abdomen is soft. There is no peripheral edema.  Filed Vitals:   12/20/13 1054  BP: 120/62  Pulse: 71  Height: 5\' 11"  (1.803 m)  Weight: 178 lb (80.74 kg)  SpO2: 95%     ASSESSMENT & PLAN

## 2013-12-20 NOTE — Assessment & Plan Note (Signed)
I decided to keep him on 200 mg of amiodarone at this time. Over time we may try to decrease the dose, but not yet.

## 2013-12-20 NOTE — Assessment & Plan Note (Signed)
The patient's rhythm remains regular. The unit is not interrogated today. He is doing great.

## 2013-12-24 ENCOUNTER — Other Ambulatory Visit: Payer: Self-pay | Admitting: Cardiology

## 2014-01-04 ENCOUNTER — Other Ambulatory Visit: Payer: Self-pay | Admitting: Cardiovascular Disease

## 2014-02-03 ENCOUNTER — Telehealth: Payer: Self-pay | Admitting: Cardiology

## 2014-02-03 NOTE — Telephone Encounter (Signed)
I spoke with Megan from Dr. Romona Curls office. She states it is ok to wait for Dr. Kae Heller return to the office to discuss holding Eliquis prior to his injection. I will forward the message to Dr. Ron Parker and Jeani Hawking for review. I will leave faxed info from Dr. Romona Curls office on Dr. Kae Heller cart.

## 2014-02-03 NOTE — Telephone Encounter (Signed)
Form brought to triage from medical records: request for discontinuation of anticoagulation prior to spine injection. I have left a message for Dr. Romona Curls assistant at [phone] to please call and let me know if the procedure has been scheduled or can this wait to be addressed when Dr. Ron Parker returns to the office.

## 2014-02-05 NOTE — Telephone Encounter (Signed)
The patient can be off Eliquis for an injection. The med must be held for a full 48 hours before injection. Example: If the patient were to take his last dose on Monday p.m., he could have the procedure on Thursday a.m. The medicine could be resumed Thursday p.m.

## 2014-02-06 NOTE — Telephone Encounter (Signed)
Levi Lewis at [place] Dr. Romona Curls office is aware that pt can be off Eliquis for an injection for full 48 hours. The copy of DrKae Heller recommendations and explanation . Faxed to [phone] att: to [place] at Dr Romona Curls assist.

## 2014-02-10 ENCOUNTER — Telehealth: Payer: Self-pay | Admitting: Cardiology

## 2014-02-10 ENCOUNTER — Encounter: Payer: Self-pay | Admitting: Cardiology

## 2014-02-10 NOTE — Telephone Encounter (Signed)
**Note De-Identified  Obfuscation** The pt is advised that Dr Ron Parker has cleared him to have his injection and that Dr Romona Curls office will be contacting him soon. He verbalized understanding.

## 2014-02-10 NOTE — Telephone Encounter (Signed)
New message           Pt is getting injections in his back and needs to be off his eliquiis for 2 days / is this ok?

## 2014-02-24 ENCOUNTER — Ambulatory Visit (INDEPENDENT_AMBULATORY_CARE_PROVIDER_SITE_OTHER): Payer: Medicare Other | Admitting: *Deleted

## 2014-02-24 DIAGNOSIS — I429 Cardiomyopathy, unspecified: Secondary | ICD-10-CM

## 2014-02-24 DIAGNOSIS — I480 Paroxysmal atrial fibrillation: Secondary | ICD-10-CM

## 2014-02-24 LAB — MDC_IDC_ENUM_SESS_TYPE_REMOTE
Battery Remaining Longevity: 87 mo
Battery Voltage: 2.99 V
Brady Statistic AP VP Percent: 88 %
Brady Statistic AS VS Percent: 8.4 %
Brady Statistic RA Percent Paced: 88 %
Date Time Interrogation Session: 20151109070014
Implantable Pulse Generator Model: 3242
Lead Channel Impedance Value: 530 Ohm
Lead Channel Impedance Value: 550 Ohm
Lead Channel Impedance Value: 780 Ohm
Lead Channel Pacing Threshold Amplitude: 1 V
Lead Channel Pacing Threshold Amplitude: 1.25 V
Lead Channel Sensing Intrinsic Amplitude: 0.4 mV
Lead Channel Sensing Intrinsic Amplitude: 12 mV
Lead Channel Setting Pacing Amplitude: 2.25 V
Lead Channel Setting Pacing Amplitude: 2.5 V
Lead Channel Setting Pacing Pulse Width: 0.4 ms
Lead Channel Setting Pacing Pulse Width: 0.4 ms
MDC IDC MSMT BATTERY REMAINING PERCENTAGE: 93 %
MDC IDC MSMT LEADCHNL LV PACING THRESHOLD PULSEWIDTH: 0.4 ms
MDC IDC MSMT LEADCHNL RV PACING THRESHOLD PULSEWIDTH: 0.4 ms
MDC IDC PG SERIAL: 2978403
MDC IDC SET LEADCHNL RV PACING AMPLITUDE: 2 V
MDC IDC SET LEADCHNL RV SENSING SENSITIVITY: 2 mV
MDC IDC STAT BRADY AP VS PERCENT: 1 %
MDC IDC STAT BRADY AS VP PERCENT: 3.7 %

## 2014-02-24 NOTE — Progress Notes (Signed)
Remote pacemaker transmission.   

## 2014-03-03 ENCOUNTER — Encounter: Payer: Self-pay | Admitting: Cardiology

## 2014-03-03 ENCOUNTER — Other Ambulatory Visit: Payer: Self-pay | Admitting: Internal Medicine

## 2014-03-11 ENCOUNTER — Telehealth: Payer: Self-pay | Admitting: Cardiology

## 2014-03-11 NOTE — Telephone Encounter (Signed)
**Note De-Identified  Obfuscation** [facility] automatically filled the pts Isosorbide RX that was d/c'd at the pts d/c from [place] in March. I asked the pts son to ask the pt not to pick up any medications that he did not request or if he still has plenty of that med not to pick up as the pharmacy will not take back. The pts son is advised and he verbalized understanding.

## 2014-03-11 NOTE — Telephone Encounter (Signed)
New Msg    Patient calling with questions about a new prescription, not sure if they should take it or not. Patient states he has never been prescribed this medication before. Please call back at [phone].

## 2014-03-19 ENCOUNTER — Encounter: Payer: Self-pay | Admitting: Cardiology

## 2014-03-24 ENCOUNTER — Encounter: Payer: Self-pay | Admitting: Internal Medicine

## 2014-03-27 ENCOUNTER — Encounter (HOSPITAL_COMMUNITY): Payer: Self-pay | Admitting: Internal Medicine

## 2014-04-03 ENCOUNTER — Encounter: Payer: Self-pay | Admitting: Cardiology

## 2014-04-04 ENCOUNTER — Ambulatory Visit (INDEPENDENT_AMBULATORY_CARE_PROVIDER_SITE_OTHER): Payer: Medicare Other | Admitting: *Deleted

## 2014-04-04 ENCOUNTER — Encounter: Payer: Self-pay | Admitting: Cardiology

## 2014-04-04 ENCOUNTER — Ambulatory Visit (INDEPENDENT_AMBULATORY_CARE_PROVIDER_SITE_OTHER): Payer: Medicare Other | Admitting: Cardiology

## 2014-04-04 VITALS — BP 112/60 | HR 73 | Ht 71.0 in | Wt 177.6 lb

## 2014-04-04 DIAGNOSIS — I480 Paroxysmal atrial fibrillation: Secondary | ICD-10-CM

## 2014-04-04 DIAGNOSIS — I452 Bifascicular block: Secondary | ICD-10-CM

## 2014-04-04 DIAGNOSIS — I429 Cardiomyopathy, unspecified: Secondary | ICD-10-CM

## 2014-04-04 DIAGNOSIS — Z950 Presence of cardiac pacemaker: Secondary | ICD-10-CM

## 2014-04-04 DIAGNOSIS — I9589 Other hypotension: Secondary | ICD-10-CM

## 2014-04-04 DIAGNOSIS — I2510 Atherosclerotic heart disease of native coronary artery without angina pectoris: Secondary | ICD-10-CM

## 2014-04-04 DIAGNOSIS — I5022 Chronic systolic (congestive) heart failure: Secondary | ICD-10-CM

## 2014-04-04 DIAGNOSIS — E038 Other specified hypothyroidism: Secondary | ICD-10-CM

## 2014-04-04 LAB — MDC_IDC_ENUM_SESS_TYPE_INCLINIC
Battery Remaining Longevity: 86.4 mo
Brady Statistic RV Percent Paced: 92 %
Date Time Interrogation Session: 20151218132529
Implantable Pulse Generator Model: 3242
Implantable Pulse Generator Serial Number: 2978403
Lead Channel Impedance Value: 525 Ohm
Lead Channel Impedance Value: 550 Ohm
Lead Channel Pacing Threshold Amplitude: 1 V
Lead Channel Pacing Threshold Amplitude: 1.25 V
Lead Channel Pacing Threshold Amplitude: 1.25 V
Lead Channel Pacing Threshold Pulse Width: 0.4 ms
Lead Channel Pacing Threshold Pulse Width: 0.4 ms
Lead Channel Pacing Threshold Pulse Width: 0.4 ms
Lead Channel Pacing Threshold Pulse Width: 0.6 ms
Lead Channel Pacing Threshold Pulse Width: 0.6 ms
Lead Channel Sensing Intrinsic Amplitude: 0.7 mV
Lead Channel Setting Pacing Amplitude: 2.5 V
Lead Channel Setting Pacing Pulse Width: 0.4 ms
Lead Channel Setting Sensing Sensitivity: 2 mV
MDC IDC MSMT BATTERY VOLTAGE: 2.99 V
MDC IDC MSMT LEADCHNL LV IMPEDANCE VALUE: 737.5 Ohm
MDC IDC MSMT LEADCHNL LV PACING THRESHOLD AMPLITUDE: 1 V
MDC IDC MSMT LEADCHNL RV PACING THRESHOLD AMPLITUDE: 1.125 V
MDC IDC MSMT LEADCHNL RV SENSING INTR AMPL: 12 mV
MDC IDC SET LEADCHNL LV PACING AMPLITUDE: 1.75 V
MDC IDC SET LEADCHNL RV PACING AMPLITUDE: 2.125
MDC IDC SET LEADCHNL RV PACING PULSEWIDTH: 0.4 ms
MDC IDC STAT BRADY RA PERCENT PACED: 88 %

## 2014-04-04 LAB — COMPREHENSIVE METABOLIC PANEL
ALBUMIN: 3.5 g/dL (ref 3.5–5.2)
ALT: 12 U/L (ref 0–53)
AST: 19 U/L (ref 0–37)
Alkaline Phosphatase: 40 U/L (ref 39–117)
BUN: 27 mg/dL — ABNORMAL HIGH (ref 6–23)
CHLORIDE: 106 meq/L (ref 96–112)
CO2: 24 meq/L (ref 19–32)
Calcium: 8.7 mg/dL (ref 8.4–10.5)
Creatinine, Ser: 1.8 mg/dL — ABNORMAL HIGH (ref 0.4–1.5)
GFR: 38.19 mL/min — AB (ref >60.00)
Glucose, Bld: 107 mg/dL — ABNORMAL HIGH (ref 70–99)
POTASSIUM: 4.1 meq/L (ref 3.5–5.1)
SODIUM: 138 meq/L (ref 135–145)
TOTAL PROTEIN: 6.5 g/dL (ref 6.0–8.3)
Total Bilirubin: 0.7 mg/dL (ref 0.2–1.2)

## 2014-04-04 LAB — CBC WITH DIFFERENTIAL/PLATELET
BASOS ABS: 0 10*3/uL (ref 0.0–0.1)
Basophils Relative: 0.5 % (ref 0.0–3.0)
EOS PCT: 1.6 % (ref 0.0–5.0)
Eosinophils Absolute: 0.1 10*3/uL (ref 0.0–0.7)
HCT: 37.5 % — ABNORMAL LOW (ref 39.0–52.0)
Hemoglobin: 12.6 g/dL — ABNORMAL LOW (ref 13.0–17.0)
LYMPHS ABS: 1.9 10*3/uL (ref 0.7–4.0)
Lymphocytes Relative: 23.4 % (ref 12.0–46.0)
MCHC: 33.6 g/dL (ref 30.0–36.0)
MCV: 93.5 fl (ref 78.0–100.0)
MONOS PCT: 10.1 % (ref 3.0–12.0)
Monocytes Absolute: 0.8 10*3/uL (ref 0.1–1.0)
NEUTROS PCT: 64.4 % (ref 43.0–77.0)
Neutro Abs: 5.2 10*3/uL (ref 1.4–7.7)
PLATELETS: 272 10*3/uL (ref 150.0–400.0)
RBC: 4.01 Mil/uL — AB (ref 4.22–5.81)
RDW: 14.3 % (ref 11.5–15.5)
WBC: 8 10*3/uL (ref 4.0–10.5)

## 2014-04-04 NOTE — Assessment & Plan Note (Addendum)
The patient says that he feels some unusual sensations when lying on his left side in bed at night. I will be talking with electrophysiology team about this today before the patient leaves.   Careful interrogation was done by the electrophysiology team. Some mild adjustments were made in his programming. With this change he did not have any significant symptoms when lying on his left side.

## 2014-04-04 NOTE — Patient Instructions (Addendum)
Your physician recommends that you continue on your current medications as directed. Please refer to the Current Medication list given to you today.  Your physician recommends that you schedule a follow-up appointment in: 3 months with Dr. Ron Parker.   Labs today: CBC, CMET, TSH

## 2014-04-04 NOTE — Progress Notes (Signed)
Patient ID: Levi Lewis, male   DOB: January 29, 1918, 80 y.o.   MRN: [id number]    HPI Patient is seen today to follow-up atrial fibrillation and CHF. He really looks good. His weight is completely stable. He is able to ambulate. He has some knee and back problems. The interrogation of his CRT device in November revealed that he was in atrial fib only of very brief time. However it was present. This is while on amiodarone 200 mg daily. He does not have significant edema.  He mentions that when he lays on his left side at night he feels some unusual beats from his pacemaker. This does not occur in any other position.  No Known Allergies  Current Outpatient Prescriptions  Medication Sig Dispense Refill  . amiodarone (PACERONE) 200 MG tablet TAKE 1 TABLET (200 MG TOTAL) BY MOUTH 2 (TWO) TIMES DAILY. 60 tablet 3  . carvedilol (COREG) 3.125 MG tablet Take 1 tablet (3.125 mg total) by mouth 2 (two) times daily with a meal. 60 tablet 1  . ELIQUIS 2.5 MG TABS tablet TAKE 1 TABLET (2.5 MG TOTAL) BY MOUTH 2 (TWO) TIMES DAILY. 60 tablet 6  . fenofibrate (TRICOR) 48 MG tablet TAKE 0.5 TABLETS (24 MG TOTAL) BY MOUTH DAILY. 45 tablet 3  . fish oil-omega-3 fatty acids 1000 MG capsule Take 2 g by mouth 2 (two) times daily.     . furosemide (LASIX) 40 MG tablet TAKE 1 TABLET (40 MG TOTAL) BY MOUTH 2 (TWO) TIMES DAILY. 180 tablet 1  . levothyroxine (SYNTHROID, LEVOTHROID) 50 MCG tablet Take 50 mcg by mouth daily.  10  . Multiple Vitamins-Minerals (OCUVITE PO) Take 2 tablets by mouth daily.     Marland Kitchen omeprazole (PRILOSEC) 20 MG capsule Take 20 mg by mouth daily.     . tamsulosin (FLOMAX) 0.4 MG CAPS capsule Take 0.4 mg by mouth 2 (two) times daily.     . triazolam (HALCION) 0.25 MG tablet Take 0.5 tablets (0.125 mg total) by mouth at bedtime as needed for sleep. 15 tablet 0   No current facility-administered medications for this visit.    History   Social History  . Marital Status: Married    Spouse Name: N/A    Number of Children: N/A  . Years of Education: N/A   Occupational History  . Not on file.   Social History Main Topics  . Smoking status: Never Smoker   . Smokeless tobacco: Not on file  . Alcohol Use: No  . Drug Use: No  . Sexual Activity: Not Currently   Other Topics Concern  . Not on file   Social History Narrative   He has never smoked or drank. No illcit drug use . He is retired from the [employer] and lives with his wife. Illicit Drug Use- no    Family History  Problem Relation Age of Onset  . Heart attack Sister   . Lung cancer Brother     smoker  . Colon cancer Neg Hx     Past Medical History  Diagnosis Date  . Left bundle branch block   . CAD (coronary artery disease)   . Hypertension   . Gout   . Dyslipidemia   . Lung nodules   . COPD (chronic obstructive pulmonary disease)   . Mitral regurgitation   . Aortic insufficiency   . Shoulder pain   . Back pain     Back and knee pain, June, 2014  . Cardiomyopathy, EF by echo  25-30% 07/14/2013  . RBBB   . Atrial fibrillation   . Pacemaker   . Shortness of breath   . Pneumonia   . Arthritis     Past Surgical History  Procedure Laterality Date  . Back surgery    . Right shoulder    . Right knee surgery    . Tonsillectomy    . Coronary angioplasty    . Bi-ventricular pacemaker insertion (crt-p)  07-15-2013    STJ Quadra Allura CRTP implanted by Dr Caryl Comes for NICM, CHF, alternating bundle branch blocks  . Cardioversion N/A 09/13/2013    Procedure: CARDIOVERSION;  Surgeon: Carlena Bjornstad, MD;  Location: [facility] ENDOSCOPY;  Service: Cardiovascular;  Laterality: N/A;  . Cardioversion N/A 10/17/2013    Procedure: CARDIOVERSION;  Surgeon: Lelon Perla, MD;  Location: [facility] ENDOSCOPY;  Service: Cardiovascular;  Laterality: N/A;  . Permanent pacemaker insertion N/A 07/15/2013    Procedure: PERMANENT PACEMAKER INSERTION;  Surgeon: Deboraha Sprang, MD;  Location: [facility] CATH LAB;  Service: Cardiovascular;  Laterality: N/A;      Patient Active Problem List   Diagnosis Date Noted  . On amiodarone therapy 11/22/2013  . Suprapubic pain 10/23/2013  . HCAP (healthcare-associated pneumonia) 10/20/2013  . Sepsis 10/19/2013  . Hypotension 10/19/2013  . Fatigue 10/15/2013  . Multiple blisters 09/03/2013  . Hypothyroidism 08/05/2013  . Pacemaker 08/04/2013  . Chronic systolic CHF (congestive heart failure) 08/04/2013  . HX: anticoagulation 08/04/2013  . CKD (chronic kidney disease) stage 4, GFR 15-29 ml/min 07/14/2013  . Cardiomyopathy, EF by echo 25-30% 07/14/2013  . Atrial fibrillation 07/13/2013  . Alternating (bilateral) bundle branch block 07/13/2013  . Back pain   . Shoulder pain   . Left bundle branch block   . CAD (coronary artery disease)   . Hypertension   . Gout   . Dyslipidemia   . Ejection fraction   . Lung nodules   . COPD (chronic obstructive pulmonary disease)   . Mitral regurgitation   . Aortic insufficiency     ROS  Patient denies fever, chills, headache, sweats, rash, change in vision, change in hearing, chest pain, cough, nausea or vomiting, urinary symptoms. All other systems are reviewed and are negative.  PHYSICAL EXAM Patient is oriented to person time and place. Affect is normal. He is here with a family member. He really looks good for 80 years of age with his medical problems. Head is atraumatic. He has actinic keratoses on his right neck and face. Sclerae and conjunctivae are normal. Lungs reveal decreased breath sounds. There are few scattered rhonchi. There is no respiratory distress. Cardiac exam reveals S1 and S2. Abdomen is soft. There is no significant peripheral edema.  Filed Vitals:   04/04/14 0748  BP: 112/60  Pulse: 73  Height: 5\' 11"  (1.803 m)  Weight: 177 lb 9.6 oz (80.559 kg)  SpO2: 97%     ASSESSMENT & PLAN

## 2014-04-04 NOTE — Assessment & Plan Note (Signed)
His blood pressure is quite stable now. We had difficulties with low blood pressure when trying to push other medications for his cardiomyopathy. No changes at this time.

## 2014-04-04 NOTE — Assessment & Plan Note (Signed)
He is doing extremely well at this time. I've chosen not to change any of his medications. I feel would be most prudent to continue on the same course that we are on at this time.

## 2014-04-04 NOTE — Assessment & Plan Note (Signed)
His thyroid status is being carefully followed while on amiodarone.

## 2014-04-04 NOTE — Progress Notes (Signed)
CRT-P device check in clinic w/Dr.Katz for stim. Normal device function. Thresholds, sensing, impedance consistent with previous measurements. Histograms appropriate for patient and level of activity. 142 AT/AF episodes (<1%)---max dur. 13 mins, Max A 404, Max V 107---sensor. No ventricular high rate episodes. Patient bi-ventricularly pacing 92% of the time. Device programmed with appropriate safety margins. Device heart failure diagnostics are within normal limits and stable over time. Estimated longevity 7.5-8.0 years. Patient will follow up with [facility] in 05-2014. LV cap changed from "ON" to "MONITOR". Fixed output programmed to 1.75 (+0.5V) safety margin. A.pulse width increased from 0.73ms to 0.47ms.

## 2014-04-04 NOTE — Assessment & Plan Note (Signed)
Patient is doing very well. He has atrial fib a small percent of the time. However he does poorly with atrial fib. Therefore I've chosen to continue his amiodarone at 200 mg daily at this time. Amiodarone labs will be checked.

## 2014-04-07 ENCOUNTER — Other Ambulatory Visit: Payer: Medicare Other

## 2014-04-07 DIAGNOSIS — E079 Disorder of thyroid, unspecified: Secondary | ICD-10-CM

## 2014-04-08 LAB — TSH: TSH: 14.217 u[IU]/mL — AB (ref 0.350–4.500)

## 2014-04-21 ENCOUNTER — Other Ambulatory Visit: Payer: Self-pay

## 2014-04-21 MED ORDER — LEVOTHYROXINE SODIUM 75 MCG PO TABS: 75.0000 ug | ORAL_TABLET | Freq: Every day | ORAL | Status: AC

## 2014-05-19 ENCOUNTER — Encounter: Payer: Self-pay | Admitting: Internal Medicine

## 2014-06-12 ENCOUNTER — Encounter: Payer: Self-pay | Admitting: Gastroenterology

## 2014-06-15 ENCOUNTER — Emergency Department (HOSPITAL_BASED_OUTPATIENT_CLINIC_OR_DEPARTMENT_OTHER): Payer: Medicare Other

## 2014-06-15 ENCOUNTER — Encounter (HOSPITAL_BASED_OUTPATIENT_CLINIC_OR_DEPARTMENT_OTHER): Payer: Self-pay

## 2014-06-15 ENCOUNTER — Emergency Department (HOSPITAL_BASED_OUTPATIENT_CLINIC_OR_DEPARTMENT_OTHER)
Admission: EM | Admit: 2014-06-15 | Discharge: 2014-06-15 | Disposition: A | Discharge status: Home / Self Care | Payer: Medicare Other | Attending: Emergency Medicine | Admitting: Emergency Medicine

## 2014-06-15 DIAGNOSIS — S199XXA Unspecified injury of neck, initial encounter: Secondary | ICD-10-CM | POA: Diagnosis not present

## 2014-06-15 DIAGNOSIS — S0083XA Contusion of other part of head, initial encounter: Secondary | ICD-10-CM | POA: Diagnosis not present

## 2014-06-15 DIAGNOSIS — Z23 Encounter for immunization: Secondary | ICD-10-CM | POA: Insufficient documentation

## 2014-06-15 DIAGNOSIS — Z79899 Other long term (current) drug therapy: Secondary | ICD-10-CM | POA: Insufficient documentation

## 2014-06-15 DIAGNOSIS — E785 Hyperlipidemia, unspecified: Secondary | ICD-10-CM | POA: Diagnosis not present

## 2014-06-15 DIAGNOSIS — J449 Chronic obstructive pulmonary disease, unspecified: Secondary | ICD-10-CM | POA: Insufficient documentation

## 2014-06-15 DIAGNOSIS — Y9301 Activity, walking, marching and hiking: Secondary | ICD-10-CM | POA: Insufficient documentation

## 2014-06-15 DIAGNOSIS — S63501A Unspecified sprain of right wrist, initial encounter: Secondary | ICD-10-CM | POA: Diagnosis not present

## 2014-06-15 DIAGNOSIS — T148XXA Other injury of unspecified body region, initial encounter: Secondary | ICD-10-CM

## 2014-06-15 DIAGNOSIS — Z9861 Coronary angioplasty status: Secondary | ICD-10-CM | POA: Diagnosis not present

## 2014-06-15 DIAGNOSIS — I2510 Atherosclerotic heart disease of native coronary artery without angina pectoris: Secondary | ICD-10-CM | POA: Insufficient documentation

## 2014-06-15 DIAGNOSIS — I10 Essential (primary) hypertension: Secondary | ICD-10-CM | POA: Diagnosis not present

## 2014-06-15 DIAGNOSIS — Z8739 Personal history of other diseases of the musculoskeletal system and connective tissue: Secondary | ICD-10-CM | POA: Insufficient documentation

## 2014-06-15 DIAGNOSIS — I4891 Unspecified atrial fibrillation: Secondary | ICD-10-CM | POA: Diagnosis not present

## 2014-06-15 DIAGNOSIS — S80212A Abrasion, left knee, initial encounter: Secondary | ICD-10-CM | POA: Diagnosis not present

## 2014-06-15 DIAGNOSIS — W01198A Fall on same level from slipping, tripping and stumbling with subsequent striking against other object, initial encounter: Secondary | ICD-10-CM | POA: Diagnosis not present

## 2014-06-15 DIAGNOSIS — S80211A Abrasion, right knee, initial encounter: Secondary | ICD-10-CM | POA: Diagnosis not present

## 2014-06-15 DIAGNOSIS — S0990XA Unspecified injury of head, initial encounter: Secondary | ICD-10-CM | POA: Diagnosis not present

## 2014-06-15 DIAGNOSIS — Y998 Other external cause status: Secondary | ICD-10-CM | POA: Insufficient documentation

## 2014-06-15 DIAGNOSIS — S0081XA Abrasion of other part of head, initial encounter: Secondary | ICD-10-CM | POA: Insufficient documentation

## 2014-06-15 DIAGNOSIS — Z8701 Personal history of pneumonia (recurrent): Secondary | ICD-10-CM | POA: Insufficient documentation

## 2014-06-15 DIAGNOSIS — Z950 Presence of cardiac pacemaker: Secondary | ICD-10-CM | POA: Diagnosis not present

## 2014-06-15 DIAGNOSIS — Y9289 Other specified places as the place of occurrence of the external cause: Secondary | ICD-10-CM | POA: Insufficient documentation

## 2014-06-15 MED ORDER — TETANUS-DIPHTH-ACELL PERTUSSIS 5-2.5-18.5 LF-MCG/0.5 IM SUSP
0.5000 mL | Freq: Once | INTRAMUSCULAR | Status: AC
  Administered 2014-06-15: 0.5 mL via INTRAMUSCULAR
  Filled 2014-06-15: qty 0.5

## 2014-06-15 NOTE — Discharge Instructions (Signed)
Abrasion An abrasion is a cut or scrape of the skin. Abrasions do not extend through all layers of the skin and most heal within 10 days. It is important to care for your abrasion properly to prevent infection. CAUSES  Most abrasions are caused by falling on, or gliding across, the ground or other surface. When your skin rubs on something, the outer and inner layer of skin rubs off, causing an abrasion. DIAGNOSIS  Your caregiver will be able to diagnose an abrasion during a physical exam.  TREATMENT  Your treatment depends on how large and deep the abrasion is. Generally, your abrasion will be cleaned with water and a mild soap to remove any dirt or debris. An antibiotic ointment may be put over the abrasion to prevent an infection. A bandage (dressing) may be wrapped around the abrasion to keep it from getting dirty.  You may need a tetanus shot if:  You cannot remember when you had your last tetanus shot.  You have never had a tetanus shot.  The injury broke your skin. If you get a tetanus shot, your arm may swell, get red, and feel warm to the touch. This is common and not a problem. If you need a tetanus shot and you choose not to have one, there is a rare chance of getting tetanus. Sickness from tetanus can be serious.  HOME CARE INSTRUCTIONS   If a dressing was applied, change it at least once a day or as directed by your caregiver. If the bandage sticks, soak it off with warm water.   Wash the area with water and a mild soap to remove all the ointment 2 times a day. Rinse off the soap and pat the area dry with a clean towel.   Reapply any ointment as directed by your caregiver. This will help prevent infection and keep the bandage from sticking. Use gauze over the wound and under the dressing to help keep the bandage from sticking.   Change your dressing right away if it becomes wet or dirty.   Only take over-the-counter or prescription medicines for pain, discomfort, or fever as  directed by your caregiver.   Follow up with your caregiver within 24-48 hours for a wound check, or as directed. If you were not given a wound-check appointment, look closely at your abrasion for redness, swelling, or pus. These are signs of infection. SEEK IMMEDIATE MEDICAL CARE IF:   You have increasing pain in the wound.   You have redness, swelling, or tenderness around the wound.   You have pus coming from the wound.   You have a fever or persistent symptoms for more than 2-3 days.  You have a fever and your symptoms suddenly get worse.  You have a bad smell coming from the wound or dressing.  MAKE SURE YOU:   Understand these instructions.  Will watch your condition.  Will get help right away if you are not doing well or get worse. Document Released: 01/12/2005 Document Revised: 03/21/2012 Document Reviewed: 03/08/2011 [facility] Patient Information 2015 [place], [place]. This information is not intended to replace advice given to you by your health care provider. Make sure you discuss any questions you have with your health care provider.  Concussion A concussion, or closed-head injury, is a brain injury caused by a direct blow to the head or by a quick and sudden movement (jolt) of the head or neck. Concussions are usually not life-threatening. Even so, the effects of a concussion can be  serious. If you have had a concussion before, you are more likely to experience concussion-like symptoms after a direct blow to the head.  CAUSES  Direct blow to the head, such as from running into another player during a soccer game, being hit in a fight, or hitting your head on a hard surface.  A jolt of the head or neck that causes the brain to move back and forth inside the skull, such as in a car crash. SIGNS AND SYMPTOMS The signs of a concussion can be hard to notice. Early on, they may be missed by you, family members, and health care providers. You may look fine but act or feel  differently. Symptoms are usually temporary, but they may last for days, weeks, or even longer. Some symptoms may appear right away while others may not show up for hours or days. Every head injury is different. Symptoms include:  Mild to moderate headaches that will not go away.  A feeling of pressure inside your head.  Having more trouble than usual:  Learning or remembering things you have heard.  Answering questions.  Paying attention or concentrating.  Organizing daily tasks.  Making decisions and solving problems.  Slowness in thinking, acting or reacting, speaking, or reading.  Getting lost or being easily confused.  Feeling tired all the time or lacking energy (fatigued).  Feeling drowsy.  Sleep disturbances.  Sleeping more than usual.  Sleeping less than usual.  Trouble falling asleep.  Trouble sleeping (insomnia).  Loss of balance or feeling lightheaded or dizzy.  Nausea or vomiting.  Numbness or tingling.  Increased sensitivity to:  Sounds.  Lights.  Distractions.  Vision problems or eyes that tire easily.  Diminished sense of taste or smell.  Ringing in the ears.  Mood changes such as feeling sad or anxious.  Becoming easily irritated or angry for little or no reason.  Lack of motivation.  Seeing or hearing things other people do not see or hear (hallucinations). DIAGNOSIS Your health care provider can usually diagnose a concussion based on a description of your injury and symptoms. He or she will ask whether you passed out (lost consciousness) and whether you are having trouble remembering events that happened right before and during your injury. Your evaluation might include:  A brain scan to look for signs of injury to the brain. Even if the test shows no injury, you may still have a concussion.  Blood tests to be sure other problems are not present. TREATMENT  Concussions are usually treated in an emergency department, in urgent  care, or at a clinic. You may need to stay in the hospital overnight for further treatment.  Tell your health care provider if you are taking any medicines, including prescription medicines, over-the-counter medicines, and natural remedies. Some medicines, such as blood thinners (anticoagulants) and aspirin, may increase the chance of complications. Also tell your health care provider whether you have had alcohol or are taking illegal drugs. This information may affect treatment.  Your health care provider will send you home with important instructions to follow.  How fast you will recover from a concussion depends on many factors. These factors include how severe your concussion is, what part of your brain was injured, your age, and how healthy you were before the concussion.  Most people with mild injuries recover fully. Recovery can take time. In general, recovery is slower in older persons. Also, persons who have had a concussion in the past or have other medical problems  may find that it takes longer to recover from their current injury. HOME CARE INSTRUCTIONS General Instructions  Carefully follow the directions your health care provider gave you.  Only take over-the-counter or prescription medicines for pain, discomfort, or fever as directed by your health care provider.  Take only those medicines that your health care provider has approved.  Do not drink alcohol until your health care provider says you are well enough to do so. Alcohol and certain other drugs may slow your recovery and can put you at risk of further injury.  If it is harder than usual to remember things, write them down.  If you are easily distracted, try to do one thing at a time. For example, do not try to watch TV while fixing dinner.  Talk with family members or close friends when making important decisions.  Keep all follow-up appointments. Repeated evaluation of your symptoms is recommended for your  recovery.  Watch your symptoms and tell others to do the same. Complications sometimes occur after a concussion. Older adults with a brain injury may have a higher risk of serious complications, such as a blood clot on the brain.  Tell your teachers, school nurse, school counselor, coach, athletic trainer, or work Freight forwarder about your injury, symptoms, and restrictions. Tell them about what you can or cannot do. They should watch for:  Increased problems with attention or concentration.  Increased difficulty remembering or learning new information.  Increased time needed to complete tasks or assignments.  Increased irritability or decreased ability to cope with stress.  Increased symptoms.  Rest. Rest helps the brain to heal. Make sure you:  Get plenty of sleep at night. Avoid staying up late at night.  Keep the same bedtime hours on weekends and weekdays.  Rest during the day. Take daytime naps or rest breaks when you feel tired.  Limit activities that require a lot of thought or concentration. These include:  Doing homework or job-related work.  Watching TV.  Working on the computer.  Avoid any situation where there is potential for another head injury (football, hockey, soccer, basketball, martial arts, downhill snow sports and horseback riding). Your condition will get worse every time you experience a concussion. You should avoid these activities until you are evaluated by the appropriate follow-up health care providers. Returning To Your Regular Activities You will need to return to your normal activities slowly, not all at once. You must give your body and brain enough time for recovery.  Do not return to sports or other athletic activities until your health care provider tells you it is safe to do so.  Ask your health care provider when you can drive, ride a bicycle, or operate heavy machinery. Your ability to react may be slower after a brain injury. Never do these  activities if you are dizzy.  Ask your health care provider about when you can return to work or school. Preventing Another Concussion It is very important to avoid another brain injury, especially before you have recovered. In rare cases, another injury can lead to permanent brain damage, brain swelling, or death. The risk of this is greatest during the first 7-10 days after a head injury. Avoid injuries by:  Wearing a seat belt when riding in a car.  Drinking alcohol only in moderation.  Wearing a helmet when biking, skiing, skateboarding, skating, or doing similar activities.  Avoiding activities that could lead to a second concussion, such as contact or recreational sports, until your health  care provider says it is okay.  Taking safety measures in your home.  Remove clutter and tripping hazards from floors and stairways.  Use grab bars in bathrooms and handrails by stairs.  Place non-slip mats on floors and in bathtubs.  Improve lighting in dim areas. SEEK MEDICAL CARE IF:  You have increased problems paying attention or concentrating.  You have increased difficulty remembering or learning new information.  You need more time to complete tasks or assignments than before.  You have increased irritability or decreased ability to cope with stress.  You have more symptoms than before. Seek medical care if you have any of the following symptoms for more than 2 weeks after your injury:  Lasting (chronic) headaches.  Dizziness or balance problems.  Nausea.  Vision problems.  Increased sensitivity to noise or light.  Depression or mood swings.  Anxiety or irritability.  Memory problems.  Difficulty concentrating or paying attention.  Sleep problems.  Feeling tired all the time. SEEK IMMEDIATE MEDICAL CARE IF:  You have severe or worsening headaches. These may be a sign of a blood clot in the brain.  You have weakness (even if only in one hand, leg, or part of  the face).  You have numbness.  You have decreased coordination.  You vomit repeatedly.  You have increased sleepiness.  One pupil is larger than the other.  You have convulsions.  You have slurred speech.  You have increased confusion. This may be a sign of a blood clot in the brain.  You have increased restlessness, agitation, or irritability.  You are unable to recognize people or places.  You have neck pain.  It is difficult to wake you up.  You have unusual behavior changes.  You lose consciousness. MAKE SURE YOU:  Understand these instructions.  Will watch your condition.  Will get help right away if you are not doing well or get worse. Document Released: 06/25/2003 Document Revised: 04/09/2013 Document Reviewed: 10/25/2012 [facility] Patient Information 2015 [place], [place]. This information is not intended to replace advice given to you by your health care provider. Make sure you discuss any questions you have with your health care provider.  Facial or Scalp Contusion A facial or scalp contusion is a deep bruise on the face or head. Injuries to the face and head generally cause a lot of swelling, especially around the eyes. Contusions are the result of an injury that caused bleeding under the skin. The contusion may turn blue, purple, or yellow. Minor injuries will give you a painless contusion, but more severe contusions may stay painful and swollen for a few weeks.  CAUSES  A facial or scalp contusion is caused by a blunt injury or trauma to the face or head area.  SIGNS AND SYMPTOMS   Swelling of the injured area.   Discoloration of the injured area.   Tenderness, soreness, or pain in the injured area.  DIAGNOSIS  The diagnosis can be made by taking a medical history and doing a physical exam. An X-ray exam, CT scan, or MRI may be needed to determine if there are any associated injuries, such as broken bones (fractures). TREATMENT  Often, the best  treatment for a facial or scalp contusion is applying cold compresses to the injured area. Over-the-counter medicines may also be recommended for pain control.  HOME CARE INSTRUCTIONS   Only take over-the-counter or prescription medicines as directed by your health care provider.   Apply ice to the injured area.  Put ice in a plastic bag.   Place a towel between your skin and the bag.   Leave the ice on for 20 minutes, 2-3 times a day.  SEEK MEDICAL CARE IF:  You have bite problems.   You have pain with chewing.   You are concerned about facial defects. SEEK IMMEDIATE MEDICAL CARE IF:  You have severe pain or a headache that is not relieved by medicine.   You have unusual sleepiness, confusion, or personality changes.   You throw up (vomit).   You have a persistent nosebleed.   You have double vision or blurred vision.   You have fluid drainage from your nose or ear.   You have difficulty walking or using your arms or legs.  MAKE SURE YOU:   Understand these instructions.  Will watch your condition.  Will get help right away if you are not doing well or get worse. Document Released: 05/12/2004 Document Revised: 01/23/2013 Document Reviewed: 11/15/2012 [facility] Patient Information 2015 [place], [place]. This information is not intended to replace advice given to you by your health care provider. Make sure you discuss any questions you have with your health care provider.  Wrist Pain Wrist injuries are frequent in adults and children. A sprain is an injury to the ligaments that hold your bones together. A strain is an injury to muscle or muscle cord-like structures (tendons) from stretching or pulling. Generally, when wrists are moderately tender to touch following a fall or injury, a break in the bone (fracture) may be present. Most wrist sprains or strains are better in 3 to 5 days, but complete healing may take several weeks. HOME CARE INSTRUCTIONS   Put  ice on the injured area.  Put ice in a plastic bag.  Place a towel between your skin and the bag.  Leave the ice on for 15-20 minutes, 3-4 times a day, for the first 2 days, or as directed by your health care provider.  Keep your arm raised above the level of your heart whenever possible to reduce swelling and pain.  Rest the injured area for at least 48 hours or as directed by your health care provider.  If a splint or elastic bandage has been applied, use it for as long as directed by your health care provider or until seen by a health care provider for a follow-up exam.  Only take over-the-counter or prescription medicines for pain, discomfort, or fever as directed by your health care provider.  Keep all follow-up appointments. You may need to follow up with a specialist or have follow-up X-rays. Improvement in pain level is not a guarantee that you did not fracture a bone in your wrist. The only way to determine whether or not you have a broken bone is by X-ray. SEEK IMMEDIATE MEDICAL CARE IF:   Your fingers are swollen, very red, white, or cold and blue.  Your fingers are numb or tingling.  You have increasing pain.  You have difficulty moving your fingers. MAKE SURE YOU:   Understand these instructions.  Will watch your condition.  Will get help right away if you are not doing well or get worse. Document Released: 01/12/2005 Document Revised: 04/09/2013 Document Reviewed: 05/26/2010 [facility] Patient Information 2015 [place], [place]. This information is not intended to replace advice given to you by your health care provider. Make sure you discuss any questions you have with your health care provider.

## 2014-06-15 NOTE — ED Provider Notes (Signed)
CSN: [id number]     Arrival date & time 06/15/14  1232 History   First MD Initiated Contact with Patient 06/15/14 1318     Chief Complaint  Patient presents with  . Fall     (Consider location/radiation/quality/duration/timing/severity/associated sxs/prior Treatment) HPI Comments: Patient presents with head and wrist pain after a fall. He states he was walking out of a gas station yesterday and missed a step, falling forward and hitting the pavement. He hit his head on the ground sustaining an abrasion to the right side of his face. There is no loss of consciousness. He is on Eliquis. He has some pain and swelling to his right wrist. He also has some discomfort to his neck. He denies any other injuries. He denies any nausea or vomiting. He denies any numbness or weakness to his extremities. The incident happened yesterday. He's not sure when his last tetanus shot was.  Patient is a 81 y.o. male presenting with fall.  Fall Associated symptoms include headaches. Pertinent negatives include no chest pain, no abdominal pain and no shortness of breath.    Past Medical History  Diagnosis Date  . Left bundle branch block   . CAD (coronary artery disease)   . Hypertension   . Gout   . Dyslipidemia   . Lung nodules   . COPD (chronic obstructive pulmonary disease)   . Mitral regurgitation   . Aortic insufficiency   . Shoulder pain   . Back pain     Back and knee pain, June, 2014  . Cardiomyopathy, EF by echo 25-30% 07/14/2013  . RBBB   . Atrial fibrillation   . Pacemaker   . Shortness of breath   . Pneumonia   . Arthritis    Past Surgical History  Procedure Laterality Date  . Back surgery    . Right shoulder    . Right knee surgery    . Tonsillectomy    . Coronary angioplasty    . Bi-ventricular pacemaker insertion (crt-p)  07-15-2013    STJ Quadra Allura CRTP implanted by Dr Caryl Comes for NICM, CHF, alternating bundle branch blocks  . Cardioversion N/A 09/13/2013    Procedure:  CARDIOVERSION;  Surgeon: Carlena Bjornstad, MD;  Location: [facility] ENDOSCOPY;  Service: Cardiovascular;  Laterality: N/A;  . Cardioversion N/A 10/17/2013    Procedure: CARDIOVERSION;  Surgeon: Lelon Perla, MD;  Location: [facility] ENDOSCOPY;  Service: Cardiovascular;  Laterality: N/A;  . Permanent pacemaker insertion N/A 07/15/2013    Procedure: PERMANENT PACEMAKER INSERTION;  Surgeon: Deboraha Sprang, MD;  Location: [facility] CATH LAB;  Service: Cardiovascular;  Laterality: N/A;   Family History  Problem Relation Age of Onset  . Heart attack Sister   . Lung cancer Brother     smoker  . Colon cancer Neg Hx    History  Substance Use Topics  . Smoking status: Never Smoker   . Smokeless tobacco: Not on file  . Alcohol Use: No    Review of Systems  Constitutional: Negative for fever, chills, diaphoresis and fatigue.  HENT: Negative for congestion, rhinorrhea and sneezing.   Eyes: Negative.   Respiratory: Negative for cough, chest tightness and shortness of breath.   Cardiovascular: Negative for chest pain and leg swelling.  Gastrointestinal: Negative for nausea, vomiting, abdominal pain, diarrhea and blood in stool.  Genitourinary: Negative for frequency, hematuria, flank pain and difficulty urinating.  Musculoskeletal: Positive for arthralgias and neck pain. Negative for back pain.  Skin: Positive for wound. Negative for rash.  Neurological: Positive for headaches. Negative for dizziness, speech difficulty, weakness and numbness.      Allergies  Review of patient's allergies indicates no known allergies.  Home Medications   Prior to Admission medications   Medication Sig Start Date End Date Taking? Authorizing Provider  amiodarone (PACERONE) 200 MG tablet TAKE 1 TABLET (200 MG TOTAL) BY MOUTH 2 (TWO) TIMES DAILY. 01/06/14   Burnell Blanks, MD  carvedilol (COREG) 3.125 MG tablet Take 1 tablet (3.125 mg total) by mouth 2 (two) times daily with a meal. 10/24/13   Delfina Redwood, MD  ELIQUIS  2.5 MG TABS tablet TAKE 1 TABLET (2.5 MG TOTAL) BY MOUTH 2 (TWO) TIMES DAILY. 03/04/14   Deboraha Sprang, MD  fenofibrate (TRICOR) 48 MG tablet TAKE 0.5 TABLETS (24 MG TOTAL) BY MOUTH DAILY. 12/25/13   Carlena Bjornstad, MD  fish oil-omega-3 fatty acids 1000 MG capsule Take 2 g by mouth 2 (two) times daily.     Historical Provider, MD  furosemide (LASIX) 40 MG tablet TAKE 1 TABLET (40 MG TOTAL) BY MOUTH 2 (TWO) TIMES DAILY. 12/25/13   Carlena Bjornstad, MD  levothyroxine (SYNTHROID, LEVOTHROID) 75 MCG tablet Take 1 tablet (75 mcg total) by mouth daily. 04/21/14   Carlena Bjornstad, MD  Multiple Vitamins-Minerals (OCUVITE PO) Take 2 tablets by mouth daily.     Historical Provider, MD  omeprazole (PRILOSEC) 20 MG capsule Take 20 mg by mouth daily.     Historical Provider, MD  tamsulosin (FLOMAX) 0.4 MG CAPS capsule Take 0.4 mg by mouth 2 (two) times daily.     Historical Provider, MD  triazolam (HALCION) 0.25 MG tablet Take 0.5 tablets (0.125 mg total) by mouth at bedtime as needed for sleep. 10/24/13   Delfina Redwood, MD   BP 120/61 mmHg  Pulse 73  Temp(Src) 97.7 F (36.5 C)  Resp 20  SpO2 98% Physical Exam  Constitutional: He is oriented to person, place, and time. He appears well-developed and well-nourished.  HENT:  Head: Normocephalic.  Abrasion to his right maxilla. There is no underlying bony tenderness. No significant facial swelling is noted.  Eyes: Conjunctivae and EOM are normal. Pupils are equal, round, and reactive to light.  Neck:  Positive mild tenderness throughout the cervical spine. There is no pain to the thoracic or lumbosacral spine  Cardiovascular: Normal rate, regular rhythm and normal heart sounds.   Pulmonary/Chest: Effort normal and breath sounds normal. No respiratory distress. He has no wheezes. He has no rales. He exhibits no tenderness.  Abdominal: Soft. Bowel sounds are normal. There is no tenderness. There is no rebound and no guarding.  Musculoskeletal: Normal range of  motion. He exhibits edema.  Positive pain and swelling to the right wrist. There is no pain to the hand or the elbow. There is no pain to her left shoulder. There is no other pain on palpation or range of motion extremities. There are some small abrasions to both knees without underlying bony tenderness. Positive radial pulses intact bilaterally  Lymphadenopathy:    He has no cervical adenopathy.  Neurological: He is alert and oriented to person, place, and time. He has normal strength. No sensory deficit. GCS eye subscore is 4. GCS verbal subscore is 5. GCS motor subscore is 6.  Skin: Skin is warm and dry. No rash noted.  Psychiatric: He has a normal mood and affect.    ED Course  Procedures (including critical care time) Labs Review Labs Reviewed - No  data to display  Imaging Review Dg Wrist Complete Right  06/15/2014   CLINICAL DATA:  [place] yesterday and entering the right breast. Complains of medial pain and swelling.  EXAM: RIGHT WRIST - COMPLETE 3+ VIEW  COMPARISON:  None.  FINDINGS: There is no evidence of fracture or dislocation. There is no evidence of arthropathy or other focal bone abnormality. Soft tissues are unremarkable.  IMPRESSION: Negative.   Electronically Signed   By: Nolon Nations M.D.   On: 06/15/2014 14:25   Ct Head Wo Contrast  06/15/2014   CLINICAL DATA:  Fall, right wrist and neck pain.  EXAM: CT HEAD WITHOUT CONTRAST  CT CERVICAL SPINE WITHOUT CONTRAST  TECHNIQUE: Multidetector CT imaging of the head and cervical spine was performed following the standard protocol without intravenous contrast. Multiplanar CT image reconstructions of the cervical spine were also generated.  COMPARISON:  None.  FINDINGS: CT HEAD FINDINGS  There is atrophy and chronic small vessel disease changes. No acute intracranial abnormality. Specifically, no hemorrhage, hydrocephalus, mass lesion, acute infarction, or significant intracranial injury. No acute calvarial abnormality. Visualized  paranasal sinuses and mastoids clear. Orbital soft tissues unremarkable.  CT CERVICAL SPINE FINDINGS  Diffuse degenerative disc disease and facet disease throughout the cervical spine. Alignment is normal. Prevertebral soft tissues are normal. Central disc herniation noted at C2-3 without cord compression. No fracture. No epidural or paraspinal hematoma.  IMPRESSION: No acute intracranial abnormality.  Atrophy, chronic microvascular disease.  Degenerative disc and facet disease throughout the cervical spine. No acute bony abnormality.  C2-3 central disc herniation.   Electronically Signed   By: Rolm Baptise M.D.   On: 06/15/2014 14:44   Ct Cervical Spine Wo Contrast  06/15/2014   CLINICAL DATA:  Fall, right wrist and neck pain.  EXAM: CT HEAD WITHOUT CONTRAST  CT CERVICAL SPINE WITHOUT CONTRAST  TECHNIQUE: Multidetector CT imaging of the head and cervical spine was performed following the standard protocol without intravenous contrast. Multiplanar CT image reconstructions of the cervical spine were also generated.  COMPARISON:  None.  FINDINGS: CT HEAD FINDINGS  There is atrophy and chronic small vessel disease changes. No acute intracranial abnormality. Specifically, no hemorrhage, hydrocephalus, mass lesion, acute infarction, or significant intracranial injury. No acute calvarial abnormality. Visualized paranasal sinuses and mastoids clear. Orbital soft tissues unremarkable.  CT CERVICAL SPINE FINDINGS  Diffuse degenerative disc disease and facet disease throughout the cervical spine. Alignment is normal. Prevertebral soft tissues are normal. Central disc herniation noted at C2-3 without cord compression. No fracture. No epidural or paraspinal hematoma.  IMPRESSION: No acute intracranial abnormality.  Atrophy, chronic microvascular disease.  Degenerative disc and facet disease throughout the cervical spine. No acute bony abnormality.  C2-3 central disc herniation.   Electronically Signed   By: Rolm Baptise M.D.    On: 06/15/2014 14:44     EKG Interpretation None      MDM   Final diagnoses:  Head injury, initial encounter  Facial contusion, initial encounter  Wrist sprain, right, initial encounter  Abrasion    There is no evidence of intracranial hemorrhage. There is no evidence of acute bony injuries to the cervical spine. There some disc herniation at the C2-C3 area. Patient has no neurologic deficits. He doesn't appear to be symptomatic from that at this point. There is no evidence of wrist fractures. He was placed in a Velcro wrist splint. He was advised in ice and elevation. His tetanus shot was updated. He was advised to make a follow-up  appointment with his primary care physician for recheck this week or return here if his symptoms worsen.    Malvin Johns, MD 06/15/14 [phone]

## 2014-06-15 NOTE — ED Notes (Signed)
Patient fell while walking out of gas station yesterday and stumbled and fell falling onto right side onto concrete. Abrasion to right side of face, right wrist pain with swelling and abrasions to bilateral knees. Denies loc, ambulatory to treatment room

## 2014-06-18 ENCOUNTER — Other Ambulatory Visit: Payer: Self-pay | Admitting: Cardiology

## 2014-06-29 ENCOUNTER — Other Ambulatory Visit: Payer: Self-pay | Admitting: Cardiovascular Disease

## 2014-07-11 ENCOUNTER — Ambulatory Visit (INDEPENDENT_AMBULATORY_CARE_PROVIDER_SITE_OTHER): Payer: Medicare Other | Admitting: Cardiology

## 2014-07-11 ENCOUNTER — Encounter: Payer: Self-pay | Admitting: Cardiology

## 2014-07-11 VITALS — BP 118/62 | HR 73 | Ht 71.0 in | Wt 176.8 lb

## 2014-07-11 DIAGNOSIS — I5022 Chronic systolic (congestive) heart failure: Secondary | ICD-10-CM | POA: Diagnosis not present

## 2014-07-11 DIAGNOSIS — I480 Paroxysmal atrial fibrillation: Secondary | ICD-10-CM

## 2014-07-11 DIAGNOSIS — E038 Other specified hypothyroidism: Secondary | ICD-10-CM

## 2014-07-11 DIAGNOSIS — Z7901 Long term (current) use of anticoagulants: Secondary | ICD-10-CM

## 2014-07-11 DIAGNOSIS — Z9229 Personal history of other drug therapy: Secondary | ICD-10-CM

## 2014-07-11 NOTE — Assessment & Plan Note (Signed)
The patient is on the appropriate dose of anticoagulation. He is stable with this.

## 2014-07-11 NOTE — Assessment & Plan Note (Addendum)
In December his TSH had been elevated in the range of 14. His Synthroid was increased to 75 g daily. The patient tells me that he has had follow-up labs with Dr. Nyoka Cowden and that he was instructed to remain on 75 g of Synthroid. We will request copy of the labs for our records.

## 2014-07-11 NOTE — Assessment & Plan Note (Signed)
His volume status is controlled. No change in therapy.

## 2014-07-11 NOTE — Progress Notes (Signed)
Cardiology Office Note   Date:  07/11/2014   ID:  JARVIN OGREN, DOB [birth year], MRN [id number]  PCP:  Criselda Peaches, MD  Cardiologist:  Dola Argyle, MD   Chief Complaint  Patient presents with  . Appointment    Follow-up atrial fibrillation      History of Present Illness: Levi Lewis is a 81 y.o. male who presents today for follow-up of atrial fibrillation. He is really doing very well. Recently fell hitting his wrist and head. Fortunately he was stable and had no major trauma. He did not have syncope. His pacemaker is being followed carefully by the electrophysiology team.    Past Medical History  Diagnosis Date  . Left bundle branch block   . CAD (coronary artery disease)   . Hypertension   . Gout   . Dyslipidemia   . Lung nodules   . COPD (chronic obstructive pulmonary disease)   . Mitral regurgitation   . Aortic insufficiency   . Shoulder pain   . Back pain     Back and knee pain, June, 2014  . Cardiomyopathy, EF by echo 25-30% 07/14/2013  . RBBB   . Atrial fibrillation   . Pacemaker   . Shortness of breath   . Pneumonia   . Arthritis     Past Surgical History  Procedure Laterality Date  . Back surgery    . Right shoulder    . Right knee surgery    . Tonsillectomy    . Coronary angioplasty    . Bi-ventricular pacemaker insertion (crt-p)  07-15-2013    STJ Quadra Allura CRTP implanted by Dr Caryl Comes for NICM, CHF, alternating bundle branch blocks  . Cardioversion N/A 09/13/2013    Procedure: CARDIOVERSION;  Surgeon: Carlena Bjornstad, MD;  Location: [facility] ENDOSCOPY;  Service: Cardiovascular;  Laterality: N/A;  . Cardioversion N/A 10/17/2013    Procedure: CARDIOVERSION;  Surgeon: Lelon Perla, MD;  Location: [facility] ENDOSCOPY;  Service: Cardiovascular;  Laterality: N/A;  . Permanent pacemaker insertion N/A 07/15/2013    Procedure: PERMANENT PACEMAKER INSERTION;  Surgeon: Deboraha Sprang, MD;  Location: [facility] CATH LAB;  Service: Cardiovascular;  Laterality: N/A;     Patient Active Problem List   Diagnosis Date Noted  . On amiodarone therapy 11/22/2013  . Suprapubic pain 10/23/2013  . HCAP (healthcare-associated pneumonia) 10/20/2013  . Sepsis 10/19/2013  . Hypotension 10/19/2013  . Fatigue 10/15/2013  . Multiple blisters 09/03/2013  . Hypothyroidism 08/05/2013  . Pacemaker 08/04/2013  . Chronic systolic CHF (congestive heart failure) 08/04/2013  . HX: anticoagulation 08/04/2013  . CKD (chronic kidney disease) stage 4, GFR 15-29 ml/min 07/14/2013  . Cardiomyopathy, EF by echo 25-30% 07/14/2013  . Atrial fibrillation 07/13/2013  . Alternating (bilateral) bundle branch block 07/13/2013  . Back pain   . Shoulder pain   . Left bundle branch block   . CAD (coronary artery disease)   . Hypertension   . Gout   . Dyslipidemia   . Ejection fraction   . Lung nodules   . COPD (chronic obstructive pulmonary disease)   . Mitral regurgitation   . Aortic insufficiency       Current Outpatient Prescriptions  Medication Sig Dispense Refill  . amiodarone (PACERONE) 200 MG tablet Take 200 mg by mouth daily.    . carvedilol (COREG) 3.125 MG tablet Take 1 tablet (3.125 mg total) by mouth 2 (two) times daily with a meal. 60 tablet 1  . ELIQUIS 2.5 MG TABS tablet TAKE  1 TABLET (2.5 MG TOTAL) BY MOUTH 2 (TWO) TIMES DAILY. 60 tablet 6  . fenofibrate (TRICOR) 48 MG tablet TAKE 0.5 TABLETS (24 MG TOTAL) BY MOUTH DAILY. 45 tablet 3  . fish oil-omega-3 fatty acids 1000 MG capsule Take 2 g by mouth 2 (two) times daily.     . furosemide (LASIX) 40 MG tablet TAKE 1 TABLET (40 MG TOTAL) BY MOUTH 2 (TWO) TIMES DAILY. 180 tablet 0  . levothyroxine (SYNTHROID, LEVOTHROID) 75 MCG tablet Take 1 tablet (75 mcg total) by mouth daily. 30 tablet 6  . Multiple Vitamins-Minerals (OCUVITE PO) Take 2 tablets by mouth daily.     Marland Kitchen omeprazole (PRILOSEC) 20 MG capsule Take 20 mg by mouth daily.     . tamsulosin (FLOMAX) 0.4 MG CAPS capsule Take 0.4 mg by mouth 2 (two) times  daily.     . triazolam (HALCION) 0.25 MG tablet Take 0.5 tablets (0.125 mg total) by mouth at bedtime as needed for sleep. 15 tablet 0   No current facility-administered medications for this visit.    Allergies:   Review of patient's allergies indicates no known allergies.    Social History:  The patient  reports that he has never smoked. He does not have any smokeless tobacco history on file. He reports that he does not drink alcohol or use illicit drugs.   Family History:  The patient's family history includes Heart attack in his sister; Lung cancer in his brother. There is no history of Colon cancer.    ROS:  Please see the history of present illness.  Patient denies fever, chills, headache, sweats, rash, change in vision. He is hard of hearing. He is not having any chest pain, cough, nausea or vomiting, urinary symptoms.      PHYSICAL EXAM: VS:  BP 118/62 mmHg  Pulse 73  Ht 5\' 11"  (1.803 m)  Wt 176 lb 12.8 oz (80.196 kg)  BMI 24.67 kg/m2 , Patient is oriented to person time and place. Affect is normal. He did not bring his hearing aids today but he can hear me. He is here with his family member. Head is atraumatic. Sclera and conjunctiva are normal. There is no jugular venous distention. Lungs are clear. Respiratory effort is not labored. Cardiac exam reveals an S1 and S2. The rhythm is regular. The abdomen is soft. There is no peripheral edema. There are no musculoskeletal deformities. He has some skin discoloration that is old on the right side of his face.  EKG:   EKG is done today. There is dual chamber pacing.   Recent Labs: 07/12/2013: Magnesium 2.3 10/19/2013: Pro B Natriuretic peptide (BNP) 7890.0* 04/04/2014: ALT 12; BUN 27*; Creatinine 1.8*; Hemoglobin 12.6*; Platelets 272.0; Potassium 4.1; Sodium 138 04/07/2014: TSH 14.217*    Lipid Panel    Component Value Date/Time   CHOL 166 09/17/2010 1032   TRIG 224.0* 09/17/2010 1032   HDL 37.40* 09/17/2010 1032   CHOLHDL 4  09/17/2010 1032   VLDL 44.8* 09/17/2010 1032   LDLCALC * 08/07/2008 0500    113        Total Cholesterol/HDL:CHD Risk Coronary Heart Disease Risk Table                     Men   Women  1/2 Average Risk   3.4   3.3  Average Risk       5.0   4.4  2 X Average Risk   9.6   7.1  3 X  Average Risk  23.4   11.0        Use the calculated Patient Ratio above and the CHD Risk Table to determine the patient's CHD Risk.        ATP III CLASSIFICATION (LDL):  <100     mg/dL   Optimal  100-129  mg/dL   Near or Above                    Optimal  130-159  mg/dL   Borderline  160-189  mg/dL   High  >190     mg/dL   Very High   LDLDIRECT 109.6 09/17/2010 1032      Wt Readings from Last 3 Encounters:  07/11/14 176 lb 12.8 oz (80.196 kg)  04/04/14 177 lb 9.6 oz (80.559 kg)  12/20/13 178 lb (80.74 kg)      Current medicines are reviewed  The patient and his family do understand his medicines.     ASSESSMENT AND PLAN:

## 2014-07-11 NOTE — Assessment & Plan Note (Signed)
The patient is continued on amiodarone 200 mg daily. I've kept him on this dose because he is very symptomatic if he has atrial fibrillation. He has only very rare atrial fib when his pacemaker is interrogated. We have followed his amiodarone labs carefully.

## 2014-07-11 NOTE — Patient Instructions (Signed)
**Note De-Identified  Obfuscation** Your physician recommends that you continue on your current medications as directed. Please refer to the Current Medication list given to you today.  Your physician wants you to follow-up in: 5 months. You will receive a reminder letter in the mail two months in advance. If you don't receive a letter, please call our office to schedule the follow-up appointment.

## 2014-07-16 ENCOUNTER — Telehealth: Payer: Self-pay

## 2014-07-16 NOTE — Telephone Encounter (Signed)
Because the office is closed until Thursday 3/31, I left a message on the VM at Dr Hervey Ard (Pts PCP) office requesting that they fax the pts latest lab results to Dr Ron Parker. I did leave [phone] as the fax number for them to fax results to with ATTN: Jeani Hawking written on cover sheet.

## 2014-07-24 ENCOUNTER — Other Ambulatory Visit: Payer: Self-pay | Admitting: Cardiovascular Disease

## 2014-07-24 ENCOUNTER — Other Ambulatory Visit: Payer: Self-pay | Admitting: Cardiology

## 2014-07-24 ENCOUNTER — Encounter: Payer: Medicare Other | Admitting: Internal Medicine

## 2014-07-24 ENCOUNTER — Other Ambulatory Visit: Payer: Self-pay | Admitting: Internal Medicine

## 2014-07-24 NOTE — Progress Notes (Signed)
Patient Care Team: [name], MD as PCP - General (Internal Medicine)   HPI  Levi Lewis is a 81 y.o. male Seen in followup for CRT P. implantation 3/15.this occurred in the context of paroxysmal atrial fibrillation and alternating bundle branch block.  EF 25-30% 3/15  He takes apixoban and amiodarone  TSH  14 12/15 (followed by PCP)  ALT was normal at that time   He is not sure if he got better following CRT implant; he noted irregular heart beats thereafter   He notes fatigue and weakness in legs     Past Medical History  Diagnosis Date  . Left bundle branch block   . CAD (coronary artery disease)   . Hypertension   . Gout   . Dyslipidemia   . Lung nodules   . COPD (chronic obstructive pulmonary disease)   . Mitral regurgitation   . Aortic insufficiency   . Shoulder pain   . Back pain     Back and knee pain, June, 2014  . Cardiomyopathy, EF by echo 25-30% 07/14/2013  . RBBB   . Atrial fibrillation   . Pacemaker   . Shortness of breath   . Pneumonia   . Arthritis     Past Surgical History  Procedure Laterality Date  . Back surgery    . Right shoulder    . Right knee surgery    . Tonsillectomy    . Coronary angioplasty    . Bi-ventricular pacemaker insertion (crt-p)  07-15-2013    STJ Quadra Allura CRTP implanted by Dr Caryl Comes for NICM, CHF, alternating bundle branch blocks  . Cardioversion N/A 09/13/2013    Procedure: CARDIOVERSION;  Surgeon: Carlena Bjornstad, MD;  Location: [facility] ENDOSCOPY;  Service: Cardiovascular;  Laterality: N/A;  . Cardioversion N/A 10/17/2013    Procedure: CARDIOVERSION;  Surgeon: Lelon Perla, MD;  Location: [facility] ENDOSCOPY;  Service: Cardiovascular;  Laterality: N/A;  . Permanent pacemaker insertion N/A 07/15/2013    Procedure: PERMANENT PACEMAKER INSERTION;  Surgeon: Deboraha Sprang, MD;  Location[facility] CATH LAB;  Service: Cardiovascular;  Laterality: N/A;    Current Outpatient Prescriptions  Medication Sig Dispense Refill  .  amiodarone (PACERONE) 200 MG tablet Take 200 mg by mouth daily.    . carvedilol (COREG) 3.125 MG tablet Take 1 tablet (3.125 mg total) by mouth 2 (two) times daily with a meal. 60 tablet 1  . ELIQUIS 2.5 MG TABS tablet TAKE 1 TABLET (2.5 MG TOTAL) BY MOUTH 2 (TWO) TIMES DAILY. 60 tablet 6  . fenofibrate (TRICOR) 48 MG tablet TAKE 0.5 TABLETS (24 MG TOTAL) BY MOUTH DAILY. 45 tablet 3  . fish oil-omega-3 fatty acids 1000 MG capsule Take 2 g by mouth 2 (two) times daily.     . furosemide (LASIX) 40 MG tablet TAKE 1 TABLET (40 MG TOTAL) BY MOUTH 2 (TWO) TIMES DAILY. 180 tablet 0  . levothyroxine (SYNTHROID, LEVOTHROID) 75 MCG tablet Take 1 tablet (75 mcg total) by mouth daily. 30 tablet 6  . Multiple Vitamins-Minerals (OCUVITE PO) Take 2 tablets by mouth daily.     Marland Kitchen omeprazole (PRILOSEC) 20 MG capsule Take 20 mg by mouth daily.     . tamsulosin (FLOMAX) 0.4 MG CAPS capsule Take 0.4 mg by mouth 2 (two) times daily.     . triazolam (HALCION) 0.25 MG tablet Take 0.5 tablets (0.125 mg total) by mouth at bedtime as needed for sleep. 15 tablet 0   No current facility-administered  medications for this visit.    No Known Allergies  Review of Systems negative except from HPI and PMH  Physical Exam There were no vitals taken for this visit. Well developed and well nourished in no acute distress HENT normal E scleral and icterus clear Neck Supple JVP flat; carotids brisk and full Clear to ausculation  Regular rate and rhythm, no murmurs gallops or rub Soft with active bowel sounds No clubbing cyanosis  Edema Alert and oriented, grossly normal motor and sensory function Skin Warm and Dry  ECG demonstrates atrial fibrillation with biventricular pacing  Assessment and  Plan  Atrial fib/flutter  High Risk Medication   Amiodarone  Atrial lead failure  HFrEF  Alternating bundle branch block  Fatigue/leg weakness  CRT-P  St Jude   We will plan to undertake cardioversion   at that  juncture we will need to understand whether the fatigue is related to the atrial arrhythmia, medication or something altogether different.  Following cardioversion then, if the symptoms persist we will stop the amiodarone.  We'll also need that juncture to understand with one of the atrial lead. We anticipate a chest x-ray at the time of cardioversion.

## 2014-07-25 ENCOUNTER — Other Ambulatory Visit: Payer: Self-pay | Admitting: Cardiology

## 2014-07-25 ENCOUNTER — Other Ambulatory Visit: Payer: Self-pay | Admitting: Cardiovascular Disease

## 2014-07-25 ENCOUNTER — Other Ambulatory Visit: Payer: Self-pay | Admitting: Internal Medicine

## 2014-07-25 MED ORDER — AMIODARONE HCL 200 MG PO TABS: 200.0000 mg | ORAL_TABLET | Freq: Every day | ORAL | Status: DC

## 2014-07-25 NOTE — Telephone Encounter (Signed)
Approved      Disp Refills Start End    furosemide (LASIX) 40 MG tablet 180 tablet 0 07/25/2014     Sig:  TAKE 1 TABLET (40 MG TOTAL) BY MOUTH 2 (TWO) TIMES DAILY.    Class:  Normal    DAW:  No    Authorizing Provider:  Carlena Bjornstad, MD    Ordering User:  Juventino Slovak, CMA

## 2014-07-31 NOTE — Telephone Encounter (Signed)
**Note De-Identified  Obfuscation** I received a copy of the pts lab results today from Dr Rolly Salter office. TSH=554 Will give results to Dr Ron Parker.

## 2014-08-12 ENCOUNTER — Encounter: Payer: Self-pay | Admitting: Internal Medicine

## 2014-08-12 ENCOUNTER — Ambulatory Visit (INDEPENDENT_AMBULATORY_CARE_PROVIDER_SITE_OTHER): Payer: Medicare Other | Admitting: Internal Medicine

## 2014-08-12 VITALS — BP 108/54 | HR 72 | Ht 70.5 in | Wt 181.2 lb

## 2014-08-12 DIAGNOSIS — I429 Cardiomyopathy, unspecified: Secondary | ICD-10-CM | POA: Diagnosis not present

## 2014-08-12 DIAGNOSIS — I5022 Chronic systolic (congestive) heart failure: Secondary | ICD-10-CM

## 2014-08-12 DIAGNOSIS — Z45018 Encounter for adjustment and management of other part of cardiac pacemaker: Secondary | ICD-10-CM | POA: Diagnosis not present

## 2014-08-12 DIAGNOSIS — I447 Left bundle-branch block, unspecified: Secondary | ICD-10-CM | POA: Diagnosis not present

## 2014-08-12 DIAGNOSIS — I480 Paroxysmal atrial fibrillation: Secondary | ICD-10-CM

## 2014-08-12 DIAGNOSIS — I495 Sick sinus syndrome: Secondary | ICD-10-CM | POA: Diagnosis not present

## 2014-08-12 LAB — MDC_IDC_ENUM_SESS_TYPE_INCLINIC
Battery Remaining Longevity: 80.4 mo
Brady Statistic RA Percent Paced: 88 %
Implantable Pulse Generator Model: 3242
Implantable Pulse Generator Serial Number: 2978403
Lead Channel Impedance Value: 550 Ohm
Lead Channel Pacing Threshold Amplitude: 0.875 V
Lead Channel Pacing Threshold Amplitude: 1 V
Lead Channel Pacing Threshold Amplitude: 1 V
Lead Channel Pacing Threshold Pulse Width: 0.4 ms
Lead Channel Pacing Threshold Pulse Width: 0.6 ms
Lead Channel Sensing Intrinsic Amplitude: 12 mV
Lead Channel Setting Pacing Amplitude: 2.5 V
Lead Channel Setting Sensing Sensitivity: 2 mV
MDC IDC MSMT BATTERY VOLTAGE: 2.98 V
MDC IDC MSMT LEADCHNL LV PACING THRESHOLD AMPLITUDE: 1.25 V
MDC IDC MSMT LEADCHNL LV PACING THRESHOLD AMPLITUDE: 1.25 V
MDC IDC MSMT LEADCHNL LV PACING THRESHOLD PULSEWIDTH: 0.4 ms
MDC IDC MSMT LEADCHNL RA IMPEDANCE VALUE: 412.5 Ohm
MDC IDC MSMT LEADCHNL RA PACING THRESHOLD PULSEWIDTH: 0.6 ms
MDC IDC MSMT LEADCHNL RA SENSING INTR AMPL: 0.6 mV
MDC IDC MSMT LEADCHNL RV IMPEDANCE VALUE: 450 Ohm
MDC IDC MSMT LEADCHNL RV PACING THRESHOLD PULSEWIDTH: 0.4 ms
MDC IDC SESS DTM: 20160426153222
MDC IDC SET LEADCHNL LV PACING AMPLITUDE: 1.5 V
MDC IDC SET LEADCHNL LV PACING PULSEWIDTH: 0.4 ms
MDC IDC SET LEADCHNL RV PACING AMPLITUDE: 2 V
MDC IDC SET LEADCHNL RV PACING PULSEWIDTH: 0.4 ms
MDC IDC STAT BRADY RV PERCENT PACED: 92 %

## 2014-08-12 LAB — BRAIN NATRIURETIC PEPTIDE: PRO B NATRI PEPTIDE: 647 pg/mL — AB (ref 0.0–100.0)

## 2014-08-12 NOTE — Progress Notes (Signed)
Patient Care Team: [name], MD as PCP - General (Internal Medicine)   HPI  Levi Lewis is a 81 y.o. male Seen in followup for CRT P. implantation 3/15.this occurred in the context of paroxysmal atrial fibrillation and alternating bundle branch block.  EF 25-30% 3/15  He takes apixoban and amiodarone  TSH  14 12/15 (followed by PCP)  ALT was normal at that time; there was a note in the chart saying that his TSH  from primary care office was 554 (???) >> spoke to office  3.554  Some chest apins ;  Unrelated to exertion duration about 1 minute No known CAD but presumed  Also exertional shortness of breath;  he does not have any peripheral edema    Past Medical History  Diagnosis Date  . Left bundle branch block   . CAD (coronary artery disease)   . Hypertension   . Gout   . Dyslipidemia   . Lung nodules   . COPD (chronic obstructive pulmonary disease)   . Mitral regurgitation   . Aortic insufficiency   . Shoulder pain   . Back pain     Back and knee pain, June, 2014  . Cardiomyopathy, EF by echo 25-30% 07/14/2013  . RBBB   . Atrial fibrillation   . Pacemaker   . Shortness of breath   . Pneumonia   . Arthritis     Past Surgical History  Procedure Laterality Date  . Back surgery    . Right shoulder    . Right knee surgery    . Tonsillectomy    . Coronary angioplasty    . Bi-ventricular pacemaker insertion (crt-p)  07-15-2013    STJ Quadra Allura CRTP implanted by Dr Caryl Comes for NICM, CHF, alternating bundle branch blocks  . Cardioversion N/A 09/13/2013    Procedure: CARDIOVERSION;  Surgeon: Carlena Bjornstad, MD;  Location: [facility] ENDOSCOPY;  Service: Cardiovascular;  Laterality: N/A;  . Cardioversion N/A 10/17/2013    Procedure: CARDIOVERSION;  Surgeon: Lelon Perla, MD;  Location: [facility] ENDOSCOPY;  Service: Cardiovascular;  Laterality: N/A;  . Permanent pacemaker insertion N/A 07/15/2013    Procedure: PERMANENT PACEMAKER INSERTION;  Surgeon: Deboraha Sprang, MD;   Location: [facility] CATH LAB;  Service: Cardiovascular;  Laterality: N/A;    Current Outpatient Prescriptions  Medication Sig Dispense Refill  . amiodarone (PACERONE) 200 MG tablet Take 1 tablet (200 mg total) by mouth daily. 30 tablet 3  . carvedilol (COREG) 3.125 MG tablet Take 1 tablet (3.125 mg total) by mouth 2 (two) times daily with a meal. 60 tablet 1  . ELIQUIS 2.5 MG TABS tablet TAKE 1 TABLET (2.5 MG TOTAL) BY MOUTH 2 (TWO) TIMES DAILY. 60 tablet 6  . fenofibrate (TRICOR) 48 MG tablet TAKE 0.5 TABLETS (24 MG TOTAL) BY MOUTH DAILY. 45 tablet 3  . fish oil-omega-3 fatty acids 1000 MG capsule Take 2 g by mouth 2 (two) times daily.     . furosemide (LASIX) 40 MG tablet TAKE 1 TABLET (40 MG TOTAL) BY MOUTH 2 (TWO) TIMES DAILY. 180 tablet 0  . levothyroxine (SYNTHROID, LEVOTHROID) 75 MCG tablet Take 1 tablet (75 mcg total) by mouth daily. 30 tablet 6  . Multiple Vitamins-Minerals (OCUVITE PO) Take 2 tablets by mouth daily.     Marland Kitchen omeprazole (PRILOSEC) 20 MG capsule Take 20 mg by mouth daily.     . tamsulosin (FLOMAX) 0.4 MG CAPS capsule Take 0.4 mg by mouth 2 (two) times daily.     Marland Kitchen  triazolam (HALCION) 0.25 MG tablet Take 0.5 tablets (0.125 mg total) by mouth at bedtime as needed for sleep. 15 tablet 0   No current facility-administered medications for this visit.    No Known Allergies  Review of Systems negative except from HPI and PMH  Physical Exam BP 108/54 mmHg  Pulse 72  Ht 5' 10.5" (1.791 m)  Wt 181 lb 3.2 oz (82.192 kg)  BMI 25.62 kg/m2 Well developed and well nourished in no acute distress HENT normal E scleral and icterus clear Neck Supple JVP flat; carotids brisk and full Clear to ausculation  Regular rate and rhythm, no murmurs gallops or rub Soft with active bowel sounds No clubbing cyanosis  Edema Alert and oriented, grossly normal motor and sensory function Skin Warm and Dry  ECG demonstrates P-synchronous/ AV  pacing   Assessment and  Plan  Atrial  fib/flutter  High Risk Medication   Amiodarone  Atrial lead failure  HFrEF  Alternating bundle branch block  Dyspnea  Chest pain-nonexertional   CRT-P  St Jude    No intercurrent atrial fibrillation  Surveillance laboratories were confirmed with Dr. Rolly Salter office and are normal. Dyspnea is of concern particularly in the context of amiodarone. He is also euvolemic. We will check a BNP to undertake pulmonary function testing if the BNP is normal.Chest pain syndrome is somewhat concerning; I have reviewed Dr. Kae Heller note. Note catheterization never been done but he presumes that he has coronary artery disease. His blood pressure limits treatment options.  We will continue on anticoagulation and not had aspirin. I've advised him that he has increasing discomfort he should let us know.

## 2014-08-12 NOTE — Telephone Encounter (Signed)
Correction to TSH number.  06/20/14  TSH was 3.554 They will refax results - and I will ensure scanned into our system

## 2014-08-12 NOTE — Patient Instructions (Addendum)
Medication Instructions:  Your physician recommends that you continue on your current medications as directed. Please refer to the Current Medication list given to you today.  Labwork: BNP  Testing/Procedures: None  Follow-Up: Remote monitoring is used to monitor your pacemaker from home. This monitoring reduces the number of office visits required to check your device to one time per year. It allows [place] to keep an eye on the functioning of your device to ensure it is working properly. You are scheduled for a device check from home on 11/11/2014. You may send your transmission at any time that day. If you have a wireless device, the transmission will be sent automatically. After your physician reviews your transmission, you will receive a postcard with your next transmission date.  Your physician recommends that you schedule a follow-up appointment in: 12 months with Dr.Klein  Any Other Special Instructions Will Be Listed Below (If Applicable).

## 2014-08-15 ENCOUNTER — Encounter: Payer: Self-pay | Admitting: Cardiology

## 2014-08-15 NOTE — Progress Notes (Signed)
TSH   3.55  (from Dr Rolly Salter office).  06/2014

## 2014-08-17 ENCOUNTER — Other Ambulatory Visit: Payer: Self-pay | Admitting: Internal Medicine

## 2014-09-19 ENCOUNTER — Other Ambulatory Visit: Payer: Self-pay | Admitting: Cardiovascular Disease

## 2014-09-19 ENCOUNTER — Other Ambulatory Visit: Payer: Self-pay | Admitting: Cardiology

## 2014-10-10 NOTE — Addendum Note (Signed)
Addended by: Roberts Gaudy on: 10/10/2014 08:23 AM   Modules accepted: Orders

## 2014-10-23 NOTE — Patient Outreach (Signed)
[place] [place] [facility]) Care Management  10/23/2014  Levi Lewis 12/21/1917 [id number]   Referral from Thurman, Maury Dus, RN assigned to outreach.  Levi Lewis. [place], [place] Management [place] Assistant Phone: [phone] Fax: [phone]

## 2014-11-11 ENCOUNTER — Ambulatory Visit (INDEPENDENT_AMBULATORY_CARE_PROVIDER_SITE_OTHER): Payer: Medicare Other | Admitting: *Deleted

## 2014-11-11 DIAGNOSIS — I447 Left bundle-branch block, unspecified: Secondary | ICD-10-CM | POA: Diagnosis not present

## 2014-11-11 DIAGNOSIS — I5022 Chronic systolic (congestive) heart failure: Secondary | ICD-10-CM

## 2014-11-11 DIAGNOSIS — I429 Cardiomyopathy, unspecified: Secondary | ICD-10-CM | POA: Diagnosis not present

## 2014-11-13 ENCOUNTER — Encounter: Payer: Self-pay | Admitting: *Deleted

## 2014-11-13 ENCOUNTER — Other Ambulatory Visit: Payer: Self-pay | Admitting: *Deleted

## 2014-11-13 NOTE — Patient Outreach (Addendum)
[facility]) Care Management  11/13/2014  HAEDYN ANCRUM 11-03-1917 [id number]   Telephone call to patient. Patient is hearing impaired and requested that we call his son Ronalee Belts. Telephone call to son  Ronalee Belts. He is very interested in the services for his father. He would like for ([facility])to send some information to his father so that he can discuss it with his sister also.  Per Ronalee Belts he is going to see his father this evening and discuss it with him further.  Plan  Send outreach packet to patient  Follow up call to patient son  Johny Shock BSN [place] Management [phone]

## 2014-11-18 LAB — CUP PACEART REMOTE DEVICE CHECK
Date Time Interrogation Session: 20160802143832
Lead Channel Setting Pacing Amplitude: 2 V
Lead Channel Setting Pacing Pulse Width: 0.4 ms
Lead Channel Setting Sensing Sensitivity: 2 mV
MDC IDC SET LEADCHNL LV PACING AMPLITUDE: 1.5 V
MDC IDC SET LEADCHNL RA PACING AMPLITUDE: 2.5 V
MDC IDC SET LEADCHNL RV PACING PULSEWIDTH: 0.4 ms
Pulse Gen Model: 3242
Pulse Gen Serial Number: 2978403

## 2014-11-18 NOTE — Progress Notes (Signed)
Remote CRT-P device check. Normal device function. Thresholds, sensing, impedance consistent with previous measurements. Histograms appropriate for patient and level of activity. No ventricular high rate episodes. Maintaining SR post DCCV. Patient bi-ventricularly pacing 95% of the time. Device programmed with appropriate safety margins. Device heart failure diagnostics are within normal limits and stable over time. Estimated longevity 6.7 years.  Merlin 02/17/15

## 2014-12-02 ENCOUNTER — Encounter: Payer: Self-pay | Admitting: Cardiology

## 2014-12-09 ENCOUNTER — Encounter: Payer: Self-pay | Admitting: *Deleted

## 2014-12-09 ENCOUNTER — Other Ambulatory Visit: Payer: Self-pay | Admitting: *Deleted

## 2014-12-09 NOTE — Patient Outreach (Signed)
[facility]) Care Management  12/09/2014  Levi Lewis Dec 06, 1917 [id number]  Telephone call to patient son Levi Lewis at [phone]. Informational  Welcome packet had been sent to patient son to review with his sister.  Patient son declined services.  Assessment: Family has declined services for patient  Plan: close case  [place] Care Management [phone]

## 2014-12-11 ENCOUNTER — Encounter: Payer: Self-pay | Admitting: Internal Medicine

## 2014-12-14 ENCOUNTER — Other Ambulatory Visit: Payer: Self-pay | Admitting: Cardiology

## 2014-12-15 ENCOUNTER — Ambulatory Visit (INDEPENDENT_AMBULATORY_CARE_PROVIDER_SITE_OTHER): Payer: Medicare Other | Admitting: Cardiology

## 2014-12-15 ENCOUNTER — Encounter: Payer: Self-pay | Admitting: Cardiology

## 2014-12-15 VITALS — BP 134/60 | HR 78 | Ht 71.0 in | Wt 179.2 lb

## 2014-12-15 DIAGNOSIS — I5022 Chronic systolic (congestive) heart failure: Secondary | ICD-10-CM

## 2014-12-15 DIAGNOSIS — Z79899 Other long term (current) drug therapy: Secondary | ICD-10-CM

## 2014-12-15 DIAGNOSIS — Z7901 Long term (current) use of anticoagulants: Secondary | ICD-10-CM

## 2014-12-15 DIAGNOSIS — I480 Paroxysmal atrial fibrillation: Secondary | ICD-10-CM | POA: Diagnosis not present

## 2014-12-15 DIAGNOSIS — Z9229 Personal history of other drug therapy: Secondary | ICD-10-CM

## 2014-12-15 DIAGNOSIS — I429 Cardiomyopathy, unspecified: Secondary | ICD-10-CM | POA: Diagnosis not present

## 2014-12-15 LAB — CBC WITH DIFFERENTIAL/PLATELET
BASOS PCT: 0.4 % (ref 0.0–3.0)
Basophils Absolute: 0 10*3/uL (ref 0.0–0.1)
Eosinophils Absolute: 0.3 10*3/uL (ref 0.0–0.7)
Eosinophils Relative: 3.7 % (ref 0.0–5.0)
HCT: 35.8 % — ABNORMAL LOW (ref 39.0–52.0)
HEMOGLOBIN: 12.1 g/dL — AB (ref 13.0–17.0)
Lymphocytes Relative: 30.7 % (ref 12.0–46.0)
Lymphs Abs: 2.1 10*3/uL (ref 0.7–4.0)
MCHC: 33.7 g/dL (ref 30.0–36.0)
MCV: 92.7 fl (ref 78.0–100.0)
MONO ABS: 1.1 10*3/uL — AB (ref 0.1–1.0)
Monocytes Relative: 15.6 % — ABNORMAL HIGH (ref 3.0–12.0)
NEUTROS PCT: 49.6 % (ref 43.0–77.0)
Neutro Abs: 3.4 10*3/uL (ref 1.4–7.7)
Platelets: 281 10*3/uL (ref 150.0–400.0)
RBC: 3.87 Mil/uL — ABNORMAL LOW (ref 4.22–5.81)
RDW: 14.1 % (ref 11.5–15.5)
WBC: 6.9 10*3/uL (ref 4.0–10.5)

## 2014-12-15 LAB — TSH: TSH: 2.34 u[IU]/mL (ref 0.35–4.50)

## 2014-12-15 NOTE — Assessment & Plan Note (Signed)
The patient is appropriately anticoagulated. No change in therapy.

## 2014-12-15 NOTE — Assessment & Plan Note (Signed)
The patient is holding sinus rhythm. I feel we should continue with 200 mg of amiodarone daily. This is not the smallest dose. However he was very symptomatic with atrial fibrillation and he is doing very well. We will check amiodarone labs.

## 2014-12-15 NOTE — Telephone Encounter (Signed)
Ok to refill 

## 2014-12-15 NOTE — Telephone Encounter (Signed)
Please refer to the pts PCP. Thanks. 

## 2014-12-15 NOTE — Patient Instructions (Signed)
**Note De-Identified  Obfuscation** Medication Instructions:  Same-no changes  Labwork: Today (CBCD, CMET and TSH)  Testing/Procedures: None  Follow-Up: Your physician wants you to follow-up in: 6 months with Dr Marlou Porch. You will receive a reminder letter in the mail two months in advance. If you don't receive a letter, please call our office to schedule the follow-up appointment.

## 2014-12-15 NOTE — Assessment & Plan Note (Signed)
The patient's EF was in the range of 25-30% in March, 2015. He has responded so well that his EF may have improved. However this is not a reason to get an echo at this time. He is clinically stable. No change in therapy.

## 2014-12-15 NOTE — Progress Notes (Signed)
Cardiology Office Note   Date:  12/15/2014   ID:  Levi Lewis, DOB 02-Aug-1917, MRN [id number]  PCP:  Criselda Peaches, MD  Cardiologist:  Dola Argyle, MD   Chief Complaint  Patient presents with  . Appointment    Follow-up atrial fibrillation      History of Present Illness: Levi Lewis is a 81 y.o. male who presents today to follow-up atrial fibrillation. He is doing remarkably well. Since we have him on amiodarone with his pacemaker, he is doing very well. His volume status is stable. He does not have any marked shortness of breath. He is limited by some knee pain and back pain  The patient is aware that I am retiring as of January 16, 2015. I will ask Dr. Marlou Porch to follow him.  Past Medical History  Diagnosis Date  . Left bundle branch block   . CAD (coronary artery disease)   . Hypertension   . Gout   . Dyslipidemia   . Lung nodules   . COPD (chronic obstructive pulmonary disease)   . Mitral regurgitation   . Aortic insufficiency   . Shoulder pain   . Back pain     Back and knee pain, June, 2014  . Cardiomyopathy, EF by echo 25-30% 07/14/2013  . RBBB   . Atrial fibrillation   . Pacemaker   . Shortness of breath   . Pneumonia   . Arthritis     Past Surgical History  Procedure Laterality Date  . Back surgery    . Right shoulder    . Right knee surgery    . Tonsillectomy    . Coronary angioplasty    . Bi-ventricular pacemaker insertion (crt-p)  07-15-2013    STJ Quadra Allura CRTP implanted by Dr Caryl Comes for NICM, CHF, alternating bundle branch blocks  . Cardioversion N/A 09/13/2013    Procedure: CARDIOVERSION;  Surgeon: Carlena Bjornstad, MD;  Location: [facility] ENDOSCOPY;  Service: Cardiovascular;  Laterality: N/A;  . Cardioversion N/A 10/17/2013    Procedure: CARDIOVERSION;  Surgeon: Lelon Perla, MD;  Location: [facility] ENDOSCOPY;  Service: Cardiovascular;  Laterality: N/A;  . Permanent pacemaker insertion N/A 07/15/2013    Procedure: PERMANENT PACEMAKER  INSERTION;  Surgeon: Deboraha Sprang, MD;  Location: [facility] CATH LAB;  Service: Cardiovascular;  Laterality: N/A;    Patient Active Problem List   Diagnosis Date Noted  . On amiodarone therapy 11/22/2013  . Suprapubic pain 10/23/2013  . HCAP (healthcare-associated pneumonia) 10/20/2013  . Sepsis 10/19/2013  . Hypotension 10/19/2013  . Fatigue 10/15/2013  . Multiple blisters 09/03/2013  . Hypothyroidism 08/05/2013  . Pacemaker 08/04/2013  . Chronic systolic CHF (congestive heart failure) 08/04/2013  . HX: anticoagulation 08/04/2013  . CKD (chronic kidney disease) stage 4, GFR 15-29 ml/min 07/14/2013  . Cardiomyopathy, EF by echo 25-30% 07/14/2013  . Atrial fibrillation 07/13/2013  . Alternating (bilateral) bundle branch block 07/13/2013  . Back pain   . Shoulder pain   . Left bundle branch block   . CAD (coronary artery disease)   . Hypertension   . Gout   . Dyslipidemia   . Ejection fraction   . Lung nodules   . COPD (chronic obstructive pulmonary disease)   . Mitral regurgitation   . Aortic insufficiency       Current Outpatient Prescriptions  Medication Sig Dispense Refill  . amiodarone (PACERONE) 200 MG tablet Take 1 tablet (200 mg total) by mouth daily. 30 tablet 3  . carvedilol (COREG)  3.125 MG tablet Take 1 tablet (3.125 mg total) by mouth 2 (two) times daily with a meal. 60 tablet 1  . ELIQUIS 2.5 MG TABS tablet TAKE 1 TABLET (2.5 MG TOTAL) BY MOUTH 2 (TWO) TIMES DAILY. 60 tablet 10  . fenofibrate (TRICOR) 48 MG tablet TAKE 0.5 TABLETS (24 MG TOTAL) BY MOUTH DAILY. 45 tablet 3  . fish oil-omega-3 fatty acids 1000 MG capsule Take 2 g by mouth 2 (two) times daily.     . furosemide (LASIX) 40 MG tablet TAKE 1 TABLET (40 MG TOTAL) BY MOUTH 2 (TWO) TIMES DAILY. 180 tablet 0  . levothyroxine (SYNTHROID, LEVOTHROID) 75 MCG tablet Take 1 tablet (75 mcg total) by mouth daily. 30 tablet 6  . Multiple Vitamins-Minerals (OCUVITE PO) Take 2 tablets by mouth daily.     Marland Kitchen omeprazole  (PRILOSEC) 20 MG capsule Take 20 mg by mouth daily.     . tamsulosin (FLOMAX) 0.4 MG CAPS capsule Take 0.4 mg by mouth 2 (two) times daily.     . triazolam (HALCION) 0.25 MG tablet Take 0.5 tablets (0.125 mg total) by mouth at bedtime as needed for sleep. 15 tablet 0   No current facility-administered medications for this visit.    Allergies:   Review of patient's allergies indicates no known allergies.    Social History:  The patient  reports that he has never smoked. He has never used smokeless tobacco. He reports that he does not drink alcohol or use illicit drugs.   Family History:  The patient's family history includes Heart attack in his sister; Lung cancer in his brother. There is no history of Colon cancer.    ROS:  Please see the history of present illness.     Patient denies fever, chills, headache, sweats, rash, change in vision, change in hearing, chest pain, cough, nausea or vomiting, urinary symptoms. All other systems are reviewed and are negative.   PHYSICAL EXAM: VS:  BP 134/60 mmHg  Pulse 78  Ht 5\' 11"  (1.803 m)  Wt 179 lb 3.2 oz (81.285 kg)  BMI 25.00 kg/m2  SpO2 95% , Patient is oriented to person time and place. Affect is normal. He is here with his son. Head is atraumatic. Sclerae and conjunctiva are normal. There is no jugulovenous distention. Lungs are clear. Respiratory effort is nonlabored. Cardiac exam reveals S1 and S2. The rhythm is regular. Abdomen is soft. There is no peripheral edema.   EKG:   EKG is not done today. The patient's pacemaker is monitored remotely.   Recent Labs: 04/04/2014: ALT 12; BUN 27*; Creatinine, Ser 1.8*; Hemoglobin 12.6*; Platelets 272.0; Potassium 4.1; Sodium 138 04/07/2014: TSH 14.217* 08/12/2014: Pro B Natriuretic peptide (BNP) 647.0*    Lipid Panel    Component Value Date/Time   CHOL 166 09/17/2010 1032   TRIG 224.0* 09/17/2010 1032   HDL 37.40* 09/17/2010 1032   CHOLHDL 4 09/17/2010 1032   VLDL 44.8* 09/17/2010  1032   LDLCALC * 08/07/2008 0500    113        Total Cholesterol/HDL:CHD Risk Coronary Heart Disease Risk Table                     Men   Women  1/2 Average Risk   3.4   3.3  Average Risk       5.0   4.4  2 X Average Risk   9.6   7.1  3 X Average Risk  23.4   11.0  Use the calculated Patient Ratio above and the CHD Risk Table to determine the patient's CHD Risk.        ATP III CLASSIFICATION (LDL):  <100     mg/dL   Optimal  100-129  mg/dL   Near or Above                    Optimal  130-159  mg/dL   Borderline  160-189  mg/dL   High  >190     mg/dL   Very High   LDLDIRECT 109.6 09/17/2010 1032      Wt Readings from Last 3 Encounters:  12/15/14 179 lb 3.2 oz (81.285 kg)  08/12/14 181 lb 3.2 oz (82.192 kg)  07/11/14 176 lb 12.8 oz (80.196 kg)      Current medicines are reviewed  The patient understands his meds.     ASSESSMENT AND PLAN:

## 2014-12-15 NOTE — Assessment & Plan Note (Signed)
Volume status is stable. No change in therapy. 

## 2014-12-16 LAB — COMPREHENSIVE METABOLIC PANEL
ALBUMIN: 3.6 g/dL (ref 3.5–5.2)
ALT: 9 U/L (ref 0–53)
AST: 14 U/L (ref 0–37)
Alkaline Phosphatase: 48 U/L (ref 39–117)
BUN: 25 mg/dL — AB (ref 6–23)
CHLORIDE: 105 meq/L (ref 96–112)
CO2: 25 mEq/L (ref 19–32)
Calcium: 8.3 mg/dL — ABNORMAL LOW (ref 8.4–10.5)
Creatinine, Ser: 1.77 mg/dL — ABNORMAL HIGH (ref 0.40–1.50)
GFR: 38.13 mL/min — AB (ref >60.00)
Glucose, Bld: 96 mg/dL (ref 70–99)
POTASSIUM: 3.6 meq/L (ref 3.5–5.1)
SODIUM: 139 meq/L (ref 135–145)
Total Bilirubin: 0.7 mg/dL (ref 0.2–1.2)
Total Protein: 6.5 g/dL (ref 6.0–8.3)

## 2014-12-17 ENCOUNTER — Other Ambulatory Visit: Payer: Self-pay | Admitting: Cardiology

## 2014-12-17 ENCOUNTER — Encounter: Payer: Self-pay | Admitting: Cardiology

## 2014-12-19 NOTE — Patient Outreach (Signed)
[place] [facility]) Care Management  12/19/2014  ROWEN WILMER 09/11/1917 [id number]   Notification from Johny Shock, RN to close case due to patient refused [place] Management services.  Thanks, Ronnell Freshwater. [place], [place] Assistant Phone: [phone] Fax: [phone]

## 2015-01-07 ENCOUNTER — Telehealth: Payer: Self-pay | Admitting: Cardiology

## 2015-01-07 ENCOUNTER — Other Ambulatory Visit: Payer: Self-pay | Admitting: *Deleted

## 2015-01-07 MED ORDER — AMIODARONE HCL 200 MG PO TABS
200.0000 mg | ORAL_TABLET | Freq: Every day | ORAL | Status: DC
Start: 2015-01-07 — End: 2015-08-03

## 2015-01-07 NOTE — Telephone Encounter (Signed)
New message      Refill amiodarone 200mg ---1 tab daily to [facility]

## 2015-01-13 ENCOUNTER — Other Ambulatory Visit: Payer: Self-pay | Admitting: Internal Medicine

## 2015-01-13 ENCOUNTER — Ambulatory Visit
Admission: RE | Admit: 2015-01-13 | Discharge: 2015-01-13 | Disposition: A | Discharge status: Home / Self Care | Payer: Medicare Other | Source: Ambulatory Visit | Attending: Internal Medicine | Admitting: Internal Medicine

## 2015-01-13 DIAGNOSIS — M542 Cervicalgia: Secondary | ICD-10-CM

## 2015-02-17 ENCOUNTER — Ambulatory Visit (INDEPENDENT_AMBULATORY_CARE_PROVIDER_SITE_OTHER): Payer: Medicare Other | Admitting: *Deleted

## 2015-02-17 DIAGNOSIS — I495 Sick sinus syndrome: Secondary | ICD-10-CM | POA: Diagnosis not present

## 2015-02-18 NOTE — Progress Notes (Signed)
Remote pacemaker transmission.   

## 2015-02-19 ENCOUNTER — Encounter (HOSPITAL_COMMUNITY): Payer: Self-pay | Admitting: *Deleted

## 2015-02-19 ENCOUNTER — Emergency Department (HOSPITAL_COMMUNITY): Payer: Medicare Other

## 2015-02-19 ENCOUNTER — Emergency Department (HOSPITAL_COMMUNITY)
Admission: EM | Admit: 2015-02-19 | Discharge: 2015-02-19 | Disposition: A | Discharge status: Home / Self Care | Payer: Medicare Other | Attending: Emergency Medicine | Admitting: Emergency Medicine

## 2015-02-19 DIAGNOSIS — Z85118 Personal history of other malignant neoplasm of bronchus and lung: Secondary | ICD-10-CM | POA: Diagnosis not present

## 2015-02-19 DIAGNOSIS — J449 Chronic obstructive pulmonary disease, unspecified: Secondary | ICD-10-CM | POA: Diagnosis not present

## 2015-02-19 DIAGNOSIS — Z952 Presence of prosthetic heart valve: Secondary | ICD-10-CM | POA: Diagnosis not present

## 2015-02-19 DIAGNOSIS — R140 Abdominal distension (gaseous): Secondary | ICD-10-CM | POA: Diagnosis not present

## 2015-02-19 DIAGNOSIS — R3911 Hesitancy of micturition: Secondary | ICD-10-CM | POA: Insufficient documentation

## 2015-02-19 DIAGNOSIS — Z7902 Long term (current) use of antithrombotics/antiplatelets: Secondary | ICD-10-CM | POA: Diagnosis not present

## 2015-02-19 DIAGNOSIS — E785 Hyperlipidemia, unspecified: Secondary | ICD-10-CM | POA: Insufficient documentation

## 2015-02-19 DIAGNOSIS — R109 Unspecified abdominal pain: Secondary | ICD-10-CM

## 2015-02-19 DIAGNOSIS — Z79899 Other long term (current) drug therapy: Secondary | ICD-10-CM | POA: Diagnosis not present

## 2015-02-19 DIAGNOSIS — I4891 Unspecified atrial fibrillation: Secondary | ICD-10-CM | POA: Insufficient documentation

## 2015-02-19 DIAGNOSIS — R1030 Lower abdominal pain, unspecified: Secondary | ICD-10-CM

## 2015-02-19 DIAGNOSIS — R188 Other ascites: Secondary | ICD-10-CM | POA: Diagnosis not present

## 2015-02-19 DIAGNOSIS — R350 Frequency of micturition: Secondary | ICD-10-CM | POA: Insufficient documentation

## 2015-02-19 DIAGNOSIS — Z9861 Coronary angioplasty status: Secondary | ICD-10-CM | POA: Diagnosis not present

## 2015-02-19 DIAGNOSIS — R300 Dysuria: Secondary | ICD-10-CM | POA: Insufficient documentation

## 2015-02-19 DIAGNOSIS — R339 Retention of urine, unspecified: Secondary | ICD-10-CM | POA: Diagnosis present

## 2015-02-19 DIAGNOSIS — I10 Essential (primary) hypertension: Secondary | ICD-10-CM | POA: Diagnosis not present

## 2015-02-19 DIAGNOSIS — Z950 Presence of cardiac pacemaker: Secondary | ICD-10-CM | POA: Insufficient documentation

## 2015-02-19 DIAGNOSIS — I2510 Atherosclerotic heart disease of native coronary artery without angina pectoris: Secondary | ICD-10-CM | POA: Diagnosis not present

## 2015-02-19 DIAGNOSIS — M549 Dorsalgia, unspecified: Secondary | ICD-10-CM

## 2015-02-19 DIAGNOSIS — M545 Low back pain: Secondary | ICD-10-CM | POA: Diagnosis not present

## 2015-02-19 DIAGNOSIS — Z8701 Personal history of pneumonia (recurrent): Secondary | ICD-10-CM | POA: Insufficient documentation

## 2015-02-19 LAB — URINALYSIS, ROUTINE W REFLEX MICROSCOPIC
Bilirubin Urine: NEGATIVE
Glucose, UA: NEGATIVE mg/dL
Hgb urine dipstick: NEGATIVE
Ketones, ur: NEGATIVE mg/dL
Leukocytes, UA: NEGATIVE
Nitrite: NEGATIVE
Protein, ur: NEGATIVE mg/dL
Specific Gravity, Urine: 1.01 (ref 1.005–1.030)
Urobilinogen, UA: 1 mg/dL (ref 0.0–1.0)
pH: 6.5 (ref 5.0–8.0)

## 2015-02-19 LAB — CBC WITH DIFFERENTIAL/PLATELET
Basophils Absolute: 0 10*3/uL (ref 0.0–0.1)
Basophils Relative: 0 %
Eosinophils Absolute: 0 10*3/uL (ref 0.0–0.7)
Eosinophils Relative: 1 %
HCT: 39.6 % (ref 39.0–52.0)
Hemoglobin: 13.2 g/dL (ref 13.0–17.0)
Lymphocytes Relative: 18 %
Lymphs Abs: 1.2 10*3/uL (ref 0.7–4.0)
MCH: 31.4 pg (ref 26.0–34.0)
MCHC: 33.3 g/dL (ref 30.0–36.0)
MCV: 94.1 fL (ref 78.0–100.0)
Monocytes Absolute: 0.5 10*3/uL (ref 0.1–1.0)
Monocytes Relative: 7 %
Neutro Abs: 5.1 10*3/uL (ref 1.7–7.7)
Neutrophils Relative %: 74 %
Platelets: 289 10*3/uL (ref 150–400)
RBC: 4.21 MIL/uL — ABNORMAL LOW (ref 4.22–5.81)
RDW: 13.1 % (ref 11.5–15.5)
WBC: 6.8 10*3/uL (ref 4.0–10.5)

## 2015-02-19 LAB — COMPREHENSIVE METABOLIC PANEL
ALT: 13 U/L — ABNORMAL LOW (ref 17–63)
AST: 16 U/L (ref 15–41)
Albumin: 3.4 g/dL — ABNORMAL LOW (ref 3.5–5.0)
Alkaline Phosphatase: 49 U/L (ref 38–126)
Anion gap: 17 — ABNORMAL HIGH (ref 5–15)
BUN: 37 mg/dL — ABNORMAL HIGH (ref 6–20)
CO2: 24 mmol/L (ref 22–32)
Calcium: 9.5 mg/dL (ref 8.9–10.3)
Chloride: 100 mmol/L — ABNORMAL LOW (ref 101–111)
Creatinine, Ser: 2.11 mg/dL — ABNORMAL HIGH (ref 0.61–1.24)
GFR calc Af Amer: 29 mL/min — ABNORMAL LOW (ref >60)
GFR calc non Af Amer: 25 mL/min — ABNORMAL LOW (ref >60)
Glucose, Bld: 120 mg/dL — ABNORMAL HIGH (ref 65–99)
Potassium: 4.5 mmol/L (ref 3.5–5.1)
Sodium: 141 mmol/L (ref 135–145)
Total Bilirubin: 0.8 mg/dL (ref 0.3–1.2)
Total Protein: 7.2 g/dL (ref 6.5–8.1)

## 2015-02-19 MED ORDER — IOHEXOL 300 MG/ML  SOLN
25.0000 mL | Freq: Once | INTRAMUSCULAR | Status: DC | PRN
Start: 2015-02-19 — End: 2015-02-20

## 2015-02-19 NOTE — ED Notes (Signed)
Post void residual 22ml

## 2015-02-19 NOTE — Discharge Instructions (Signed)
Return here as needed.  Follow-up with your primary care doctor.  Your CT scan did not indicate any reason for your issue

## 2015-02-19 NOTE — ED Provider Notes (Signed)
CSN: [id number]     Arrival date & time 02/19/15  1546 History   First MD Initiated Contact with Patient 02/19/15 1658     Chief Complaint  Patient presents with  . Urinary Retention     (Consider location/radiation/quality/duration/timing/severity/associated sxs/prior Treatment) HPI Levi Lewis is a 81 yo male who presents to the Emergency Department for evaluation of urinary retention sent over from PCP. The pt endorses urinary frequency, hesitancy, dysuria. Denies fever, hematuria. He states symptoms has been going on for 1 week. He feels he is unable to fully empty his bladder when he urinates. He also reports abdominal swelling and tightness. Also, notes new left-sided back pain around the hip region that started a week ago. He also has pain on the right side of his back which appears chronic in nature. Denies chest pain, shortness of breath, dizziness, syncope.   Past Medical History  Diagnosis Date  . Left bundle branch block   . CAD (coronary artery disease)   . Hypertension   . Gout   . Dyslipidemia   . Lung nodules   . COPD (chronic obstructive pulmonary disease) ([place])   . Mitral regurgitation   . Aortic insufficiency   . Shoulder pain   . Back pain     Back and knee pain, June, 2014  . Cardiomyopathy, EF by echo 25-30% 07/14/2013  . RBBB   . Atrial fibrillation ([place])   . Pacemaker   . Shortness of breath   . Pneumonia   . Arthritis    Past Surgical History  Procedure Laterality Date  . Back surgery    . Right shoulder    . Right knee surgery    . Tonsillectomy    . Coronary angioplasty    . Bi-ventricular pacemaker insertion (crt-p)  07-15-2013    STJ Quadra Allura CRTP implanted by Dr Caryl Comes for NICM, CHF, alternating bundle branch blocks  . Cardioversion N/A 09/13/2013    Procedure: CARDIOVERSION;  Surgeon: Carlena Bjornstad, MD;  Location: [facility] ENDOSCOPY;  Service: Cardiovascular;  Laterality: N/A;  . Cardioversion N/A 10/17/2013    Procedure: CARDIOVERSION;   Surgeon: Lelon Perla, MD;  Location: [facility] ENDOSCOPY;  Service: Cardiovascular;  Laterality: N/A;  . Permanent pacemaker insertion N/A 07/15/2013    Procedure: PERMANENT PACEMAKER INSERTION;  Surgeon: Deboraha Sprang, MD;  Location: [facility] CATH LAB;  Service: Cardiovascular;  Laterality: N/A;   Family History  Problem Relation Age of Onset  . Heart attack Sister   . Lung cancer Brother     smoker  . Colon cancer Neg Hx    Social History  Substance Use Topics  . Smoking status: Never Smoker   . Smokeless tobacco: Never Used  . Alcohol Use: No    Review of Systems  All other systems negative except as documented in the HPI. All pertinent positives and negatives as reviewed in the HPI.  Allergies  Review of patient's allergies indicates no known allergies.  Home Medications   Prior to Admission medications   Medication Sig Start Date End Date Taking? Authorizing Provider  amiodarone (PACERONE) 200 MG tablet Take 1 tablet (200 mg total) by mouth daily. 01/07/15   Carlena Bjornstad, MD  carvedilol (COREG) 3.125 MG tablet Take 1 tablet (3.125 mg total) by mouth 2 (two) times daily with a meal. 10/24/13   Delfina Redwood, MD  ELIQUIS 2.5 MG TABS tablet TAKE 1 TABLET (2.5 MG TOTAL) BY MOUTH 2 (TWO) TIMES DAILY. 08/18/14   Revonda Standard  Caryl Comes, MD  fenofibrate (TRICOR) 48 MG tablet TAKE 0.5 TABLETS (24 MG TOTAL) BY MOUTH DAILY. 12/25/13   Carlena Bjornstad, MD  fish oil-omega-3 fatty acids 1000 MG capsule Take 2 g by mouth 2 (two) times daily.     Historical Provider, MD  furosemide (LASIX) 40 MG tablet TAKE 1 TABLET (40 MG TOTAL) BY MOUTH 2 (TWO) TIMES DAILY. 12/17/14   Carlena Bjornstad, MD  levothyroxine (SYNTHROID, LEVOTHROID) 75 MCG tablet Take 1 tablet (75 mcg total) by mouth daily. 04/21/14   Carlena Bjornstad, MD  Multiple Vitamins-Minerals (OCUVITE PO) Take 2 tablets by mouth daily.     Historical Provider, MD  omeprazole (PRILOSEC) 20 MG capsule Take 20 mg by mouth daily.     Historical Provider, MD   tamsulosin (FLOMAX) 0.4 MG CAPS capsule Take 0.4 mg by mouth 2 (two) times daily.     Historical Provider, MD  triazolam (HALCION) 0.25 MG tablet Take 0.5 tablets (0.125 mg total) by mouth at bedtime as needed for sleep. 10/24/13   Delfina Redwood, MD   BP 135/80 mmHg  Pulse 79  Temp(Src) 97.5 F (36.4 C) (Oral)  Resp 16  SpO2 95% Physical Exam  Constitutional: He is oriented to person, place, and time. He appears well-developed and well-nourished. No distress.  HENT:  Head: Normocephalic and atraumatic.  Mouth/Throat: Oropharynx is clear and moist.  Eyes: Conjunctivae are normal. Pupils are equal, round, and reactive to light.  Neck: Normal range of motion. Neck supple.  Cardiovascular: Normal rate and regular rhythm.  Exam reveals no gallop and no friction rub.   No murmur heard. Patient has implanted pacemaker.  Pulmonary/Chest: Effort normal and breath sounds normal. No respiratory distress. He has no wheezes. He has no rales.  Abdominal: Soft. Bowel sounds are normal. He exhibits distension and ascites. He exhibits no mass. There is no tenderness. There is no rebound, no guarding and no CVA tenderness.  Musculoskeletal: Normal range of motion.  Neurological: He is alert and oriented to person, place, and time.  Skin: Skin is warm and dry. No rash noted. No erythema.   ED Course  Procedures (including critical care time) Labs Review Labs Reviewed  COMPREHENSIVE METABOLIC PANEL - Abnormal; Notable for the following:    Chloride 100 (*)    Glucose, Bld 120 (*)    BUN 37 (*)    Creatinine, Ser 2.11 (*)    Albumin 3.4 (*)    ALT 13 (*)    GFR calc non Af Amer 25 (*)    GFR calc Af Amer 29 (*)    Anion gap 17 (*)    All other components within normal limits  CBC WITH DIFFERENTIAL/PLATELET - Abnormal; Notable for the following:    RBC 4.21 (*)    All other components within normal limits  URINE CULTURE  URINALYSIS, ROUTINE W REFLEX MICROSCOPIC (NOT AT [facility])    Imaging  Review No results found. I have personally reviewed and evaluated these images and lab results as part of my medical decision-making.   EKG Interpretation None      9:50 PM Pt is stable. He continues to complain of tightness around his abdomen.  Patient is referred back to his primary care Dr. told to return here as needed.  Patient's CT scan does not show any significant abnormalities.  He is advised return here as needed  Dalia Heading, PA-C 02/22/15 0115  Nat Christen, MD 02/22/15 1558

## 2015-02-19 NOTE — ED Notes (Signed)
Patient sent over from PCP for urinary retention. Patient states he has no been unable to fully urinate and feels as if his bladder is full.

## 2015-02-20 LAB — URINE CULTURE

## 2015-02-20 NOTE — ED Provider Notes (Addendum)
Medical screening examination/treatment/procedure(s) were conducted as a shared visit with non-physician practitioner(s) and myself.  I personally evaluated the patient during the encounter.   EKG Interpretation None     No acute abdomen. Urinalysis normal. Creatinine elevated but this is not new. CT scan reviewed.  Patient has primary care follow-up.  Nat Christen, MD 02/20/15 1716  Nat Christen, MD 02/22/15 1600

## 2015-03-05 ENCOUNTER — Encounter: Payer: Self-pay | Admitting: Cardiology

## 2015-03-05 LAB — CUP PACEART REMOTE DEVICE CHECK
Battery Voltage: 2.98 V
Brady Statistic AP VS Percent: 1 %
Brady Statistic AS VP Percent: 3.3 %
Brady Statistic AS VS Percent: 5.7 %
Implantable Lead Implant Date: 20150330
Implantable Lead Location: 753859
Lead Channel Impedance Value: 430 Ohm
Lead Channel Pacing Threshold Amplitude: 1 V
Lead Channel Pacing Threshold Amplitude: 1 V
Lead Channel Pacing Threshold Pulse Width: 0.4 ms
Lead Channel Pacing Threshold Pulse Width: 0.4 ms
Lead Channel Setting Pacing Amplitude: 2 V
Lead Channel Setting Pacing Amplitude: 2.5 V
Lead Channel Setting Pacing Pulse Width: 0.4 ms
MDC IDC LEAD IMPLANT DT: 20150330
MDC IDC LEAD IMPLANT DT: 20150330
MDC IDC LEAD LOCATION: 753858
MDC IDC LEAD LOCATION: 753860
MDC IDC MSMT BATTERY REMAINING LONGEVITY: 82 mo
MDC IDC MSMT BATTERY REMAINING PERCENTAGE: 95.5 %
MDC IDC MSMT LEADCHNL LV IMPEDANCE VALUE: 530 Ohm
MDC IDC MSMT LEADCHNL LV PACING THRESHOLD AMPLITUDE: 1.25 V
MDC IDC MSMT LEADCHNL RA IMPEDANCE VALUE: 430 Ohm
MDC IDC MSMT LEADCHNL RA PACING THRESHOLD PULSEWIDTH: 0.6 ms
MDC IDC MSMT LEADCHNL RA SENSING INTR AMPL: 0.6 mV
MDC IDC MSMT LEADCHNL RV SENSING INTR AMPL: 12 mV
MDC IDC PG MODEL: 3242
MDC IDC PG SERIAL: 2978403
MDC IDC SESS DTM: 20161101090545
MDC IDC SET LEADCHNL LV PACING AMPLITUDE: 1.5 V
MDC IDC SET LEADCHNL RV PACING PULSEWIDTH: 0.4 ms
MDC IDC SET LEADCHNL RV SENSING SENSITIVITY: 2 mV
MDC IDC STAT BRADY AP VP PERCENT: 91 %
MDC IDC STAT BRADY RA PERCENT PACED: 91 %

## 2015-03-09 ENCOUNTER — Other Ambulatory Visit: Payer: Self-pay | Admitting: Cardiology

## 2015-03-17 ENCOUNTER — Other Ambulatory Visit: Payer: Self-pay | Admitting: Cardiology

## 2015-03-19 ENCOUNTER — Encounter: Payer: Self-pay | Admitting: Cardiology

## 2015-05-19 ENCOUNTER — Ambulatory Visit (INDEPENDENT_AMBULATORY_CARE_PROVIDER_SITE_OTHER): Payer: Medicare Other | Admitting: *Deleted

## 2015-05-19 DIAGNOSIS — I495 Sick sinus syndrome: Secondary | ICD-10-CM | POA: Diagnosis not present

## 2015-05-19 NOTE — Progress Notes (Signed)
Remote pacemaker transmission.   

## 2015-06-04 ENCOUNTER — Other Ambulatory Visit: Payer: Self-pay | Admitting: Cardiology

## 2015-06-07 LAB — CUP PACEART REMOTE DEVICE CHECK
Battery Remaining Longevity: 82 mo
Battery Remaining Percentage: 95.5 %
Brady Statistic AP VP Percent: 90 %
Brady Statistic RA Percent Paced: 90 %
Date Time Interrogation Session: 20170131075032
Implantable Lead Implant Date: 20150330
Implantable Lead Location: 753858
Implantable Lead Location: 753860
Lead Channel Impedance Value: 400 Ohm
Lead Channel Impedance Value: 550 Ohm
Lead Channel Pacing Threshold Pulse Width: 0.6 ms
Lead Channel Sensing Intrinsic Amplitude: 0.8 mV
Lead Channel Setting Pacing Amplitude: 1.5 V
Lead Channel Setting Pacing Amplitude: 2.125
Lead Channel Setting Pacing Pulse Width: 0.4 ms
MDC IDC LEAD IMPLANT DT: 20150330
MDC IDC LEAD IMPLANT DT: 20150330
MDC IDC LEAD LOCATION: 753859
MDC IDC MSMT BATTERY VOLTAGE: 2.98 V
MDC IDC MSMT LEADCHNL LV PACING THRESHOLD AMPLITUDE: 1.25 V
MDC IDC MSMT LEADCHNL LV PACING THRESHOLD PULSEWIDTH: 0.4 ms
MDC IDC MSMT LEADCHNL RA PACING THRESHOLD AMPLITUDE: 1 V
MDC IDC MSMT LEADCHNL RV IMPEDANCE VALUE: 430 Ohm
MDC IDC MSMT LEADCHNL RV PACING THRESHOLD AMPLITUDE: 1.125 V
MDC IDC MSMT LEADCHNL RV PACING THRESHOLD PULSEWIDTH: 0.4 ms
MDC IDC MSMT LEADCHNL RV SENSING INTR AMPL: 12 mV
MDC IDC PG SERIAL: 2978403
MDC IDC SET LEADCHNL LV PACING PULSEWIDTH: 0.4 ms
MDC IDC SET LEADCHNL RA PACING AMPLITUDE: 2.5 V
MDC IDC SET LEADCHNL RV SENSING SENSITIVITY: 2 mV
MDC IDC STAT BRADY AP VS PERCENT: 1 %
MDC IDC STAT BRADY AS VP PERCENT: 3.7 %
MDC IDC STAT BRADY AS VS PERCENT: 6.4 %
Pulse Gen Model: 3242

## 2015-06-12 ENCOUNTER — Encounter: Payer: Self-pay | Admitting: Cardiology

## 2015-06-22 ENCOUNTER — Encounter: Payer: Self-pay | Admitting: Cardiology

## 2015-06-22 ENCOUNTER — Ambulatory Visit (INDEPENDENT_AMBULATORY_CARE_PROVIDER_SITE_OTHER): Payer: Medicare Other | Admitting: Cardiology

## 2015-06-22 VITALS — BP 110/56 | HR 70 | Ht 71.0 in | Wt 179.0 lb

## 2015-06-22 DIAGNOSIS — I5022 Chronic systolic (congestive) heart failure: Secondary | ICD-10-CM

## 2015-06-22 DIAGNOSIS — Z79899 Other long term (current) drug therapy: Secondary | ICD-10-CM | POA: Diagnosis not present

## 2015-06-22 DIAGNOSIS — Z9229 Personal history of other drug therapy: Secondary | ICD-10-CM

## 2015-06-22 DIAGNOSIS — Z7901 Long term (current) use of anticoagulants: Secondary | ICD-10-CM

## 2015-06-22 DIAGNOSIS — I480 Paroxysmal atrial fibrillation: Secondary | ICD-10-CM | POA: Diagnosis not present

## 2015-06-22 DIAGNOSIS — I429 Cardiomyopathy, unspecified: Secondary | ICD-10-CM

## 2015-06-22 LAB — COMPREHENSIVE METABOLIC PANEL
ALBUMIN: 3.5 g/dL — AB (ref 3.6–5.1)
ALT: 8 U/L — AB (ref 9–46)
AST: 13 U/L (ref 10–35)
Alkaline Phosphatase: 57 U/L (ref 40–115)
BUN: 29 mg/dL — ABNORMAL HIGH (ref 7–25)
CALCIUM: 8 mg/dL — AB (ref 8.6–10.3)
CO2: 23 mmol/L (ref 20–31)
Chloride: 111 mmol/L — ABNORMAL HIGH (ref 98–110)
Creat: 1.68 mg/dL — ABNORMAL HIGH (ref 0.70–1.11)
GLUCOSE: 104 mg/dL — AB (ref 65–99)
POTASSIUM: 3.7 mmol/L (ref 3.5–5.3)
Sodium: 143 mmol/L (ref 135–146)
Total Bilirubin: 0.5 mg/dL (ref 0.2–1.2)
Total Protein: 6.1 g/dL (ref 6.1–8.1)

## 2015-06-22 LAB — CBC
HCT: 37.1 % — ABNORMAL LOW (ref 39.0–52.0)
Hemoglobin: 12.6 g/dL — ABNORMAL LOW (ref 13.0–17.0)
MCH: 31.8 pg (ref 26.0–34.0)
MCHC: 34 g/dL (ref 30.0–36.0)
MCV: 93.7 fL (ref 78.0–100.0)
MPV: 9.1 fL (ref 8.6–12.4)
PLATELETS: 250 10*3/uL (ref 150–400)
RBC: 3.96 MIL/uL — ABNORMAL LOW (ref 4.22–5.81)
RDW: 14.3 % (ref 11.5–15.5)
WBC: 9.4 10*3/uL (ref 4.0–10.5)

## 2015-06-22 LAB — T4, FREE: Free T4: 1.8 ng/dL (ref 0.8–1.8)

## 2015-06-22 LAB — TSH: TSH: 1.18 mIU/L (ref 0.40–4.50)

## 2015-06-22 NOTE — Patient Instructions (Signed)
Medication Instructions:  The current medical regimen is effective;  continue present plan and medications.  Labwork: Please have blood work today (CBC, CMP, TSH and Free T4)  Follow-Up: Follow up in 6 months with Dr. Skains.  You will receive a letter in the mail 2 months before you are due.  Please call us when you receive this letter to schedule your follow up appointment.  If you need a refill on your cardiac medications before your next appointment, please call your pharmacy.  Thank you for choosing [facility]!!     

## 2015-06-22 NOTE — Progress Notes (Signed)
Cardiology Office Note    Date:  06/22/2015   ID:  LY STENCIL, DOB [birth year], MRN [id number]  PCP:  Criselda Peaches, MD  Cardiologist:   Candee Furbish, MD (Former Ron Parker)  Chief Complaint  Patient presents with  . Follow-up    some on & off dizzy spells    History of Present Illness:  Levi Lewis is a 82 y.o. male former patient of Dr. Ron Parker with atrial fibrillation on amiodarone, pacemaker for sick sinus syndrome. Limited by some knee pain, back pain , no significant shortness of breath.  Cardiomyopathy previous echo showed EF of 25-30%, COPD also noted.  Overall he is been doing well without any significant change in his symptoms. He does have baseline shortness of breath which is no change.   no bleeding, no  syncope, no chest pain. He was in the [employer] in [place] in the [place].     Past Medical History  Diagnosis Date  . Left bundle branch block   . CAD (coronary artery disease)   . Hypertension   . Gout   . Dyslipidemia   . Lung nodules   . COPD (chronic obstructive pulmonary disease) ([place])   . Mitral regurgitation   . Aortic insufficiency   . Shoulder pain   . Back pain     Back and knee pain, June, 2014  . Cardiomyopathy, EF by echo 25-30% 07/14/2013  . RBBB   . Atrial fibrillation ([place])   . Pacemaker   . Shortness of breath   . Pneumonia   . Arthritis     Past Surgical History  Procedure Laterality Date  . Back surgery    . Right shoulder    . Right knee surgery    . Tonsillectomy    . Coronary angioplasty    . Bi-ventricular pacemaker insertion (crt-p)  07-15-2013    STJ Quadra Allura CRTP implanted by Dr Caryl Comes for NICM, CHF, alternating bundle branch blocks  . Cardioversion N/A 09/13/2013    Procedure: CARDIOVERSION;  Surgeon: Carlena Bjornstad, MD;  Location: [facility] ENDOSCOPY;  Service: Cardiovascular;  Laterality: N/A;  . Cardioversion N/A 10/17/2013    Procedure: CARDIOVERSION;  Surgeon: Lelon Perla, MD;  Location: [facility] ENDOSCOPY;  Service:  Cardiovascular;  Laterality: N/A;  . Permanent pacemaker insertion N/A 07/15/2013    Procedure: PERMANENT PACEMAKER INSERTION;  Surgeon: Deboraha Sprang, MD;  Location: [facility] CATH LAB;  Service: Cardiovascular;  Laterality: N/A;    Outpatient Prescriptions Prior to Visit  Medication Sig Dispense Refill  . amiodarone (PACERONE) 200 MG tablet Take 1 tablet (200 mg total) by mouth daily. 30 tablet 5  . carvedilol (COREG) 3.125 MG tablet Take 1 tablet (3.125 mg total) by mouth 2 (two) times daily with a meal. 60 tablet 1  . ELIQUIS 2.5 MG TABS tablet TAKE 1 TABLET (2.5 MG TOTAL) BY MOUTH 2 (TWO) TIMES DAILY. 60 tablet 10  . fenofibrate (TRICOR) 48 MG tablet TAKE HALF (0.5) TABLETS (24 MG TOTAL) BY MOUTH DAILY. 15 tablet 0  . fish oil-omega-3 fatty acids 1000 MG capsule Take 2 g by mouth 2 (two) times daily.     . furosemide (LASIX) 40 MG tablet TAKE 1 TABLET (40 MG TOTAL) BY MOUTH 2 (TWO) TIMES DAILY. 180 tablet 2  . levothyroxine (SYNTHROID, LEVOTHROID) 75 MCG tablet Take 1 tablet (75 mcg total) by mouth daily. 30 tablet 6  . Multiple Vitamins-Minerals (OCUVITE PO) Take 2 tablets by mouth daily.     Marland Kitchen  omeprazole (PRILOSEC) 20 MG capsule Take 20 mg by mouth daily.     . predniSONE (DELTASONE) 10 MG tablet Take 10 mg by mouth daily.  0  . sodium chloride (OCEAN) 0.65 % SOLN nasal spray Place 1 spray into both nostrils daily as needed for congestion.     . tamsulosin (FLOMAX) 0.4 MG CAPS capsule Take 0.4 mg by mouth 2 (two) times daily.     . triazolam (HALCION) 0.25 MG tablet Take 0.5 tablets (0.125 mg total) by mouth at bedtime as needed for sleep. 15 tablet 0   No facility-administered medications prior to visit.     Allergies:   Review of patient's allergies indicates no known allergies.   Social History   Social History  . Marital Status: Married    Spouse Name: N/A  . Number of Children: N/A  . Years of Education: N/A   Social History Main Topics  . Smoking status: Never Smoker   .  Smokeless tobacco: Never Used  . Alcohol Use: No  . Drug Use: No  . Sexual Activity: Not Currently   Other Topics Concern  . None   Social History Narrative   He has never smoked or drank. No illcit drug use . He is retired from the [employer] and lives with his wife. Illicit Drug Use- no     Family History:  The patient's family history includes Heart attack in his sister; Lung cancer in his brother. There is no history of Colon cancer.   ROS:   Please see the history of present illness.    ROS  No bleeding, no syncope, no orthopnea, no PND. Baseline shortness of breath noted. No chest pain. All other systems reviewed and are negative.   PHYSICAL EXAM:   VS:  BP 110/56 mmHg  Pulse 70  Ht 5\' 11"  (1.803 m)  Wt 179 lb (81.194 kg)  BMI 24.98 kg/m2  SpO2 96%   GEN: Well nourished, well developed, elderly in no acute distress HEENT: normal Neck: no JVD, carotid bruits, or masses Cardiac: RRR; no murmurs, rubs, or gallops,no edema  Respiratory:  clear to auscultation bilaterally, normal work of breathing GI: soft, nontender, nondistended, + BS MS: no deformity or atrophy Skin: warm and dry, no rash bruising noted Neuro:  Alert and Oriented x 3, Strength and sensation are intact Psych: euthymic mood, full affect  Wt Readings from Last 3 Encounters:  06/22/15 179 lb (81.194 kg)  12/15/14 179 lb 3.2 oz (81.285 kg)  08/12/14 181 lb 3.2 oz (82.192 kg)      Studies/Labs Reviewed:   EKG:  EKG is not ordered today.    Recent Labs: 08/12/2014: Pro B Natriuretic peptide (BNP) 647.0* 12/15/2014: TSH 2.34 02/19/2015: ALT 13*; BUN 37*; Creatinine, Ser 2.11*; Hemoglobin 13.2; Platelets 289; Potassium 4.5; Sodium 141   Lipid Panel    Component Value Date/Time   CHOL 166 09/17/2010 1032   TRIG 224.0* 09/17/2010 1032   HDL 37.40* 09/17/2010 1032   CHOLHDL 4 09/17/2010 1032   VLDL 44.8* 09/17/2010 1032   LDLCALC * 08/07/2008 0500    113        Total Cholesterol/HDL:CHD  Risk Coronary Heart Disease Risk Table                     Men   Women  1/2 Average Risk   3.4   3.3  Average Risk       5.0   4.4  2 X Average  Risk   9.6   7.1  3 X Average Risk  23.4   11.0        Use the calculated Patient Ratio above and the CHD Risk Table to determine the patient's CHD Risk.        ATP III CLASSIFICATION (LDL):  <100     mg/dL   Optimal  100-129  mg/dL   Near or Above                    Optimal  130-159  mg/dL   Borderline  160-189  mg/dL   High  >190     mg/dL   Very High   LDLDIRECT 109.6 09/17/2010 1032    Additional studies/ records that were reviewed today include:   prior office notes reviewed, lab work reviewed    ASSESSMENT:    1. Paroxysmal atrial fibrillation (HCC)   2. Cardiomyopathy, EF by echo 25-30%   3. Chronic systolic CHF (congestive heart failure) ([place])   4. On amiodarone therapy   5. HX: anticoagulation      PLAN:  In order of problems listed above:   Paroxysmal atrial fibrillation - amiodarone therapy seems to be maintaining sinus rhythm -  Pacemaker functioning well -  Dr. Ron Parker felt as though he should continue with amiodarone 20 mg daily therapy. This was not the smallest dose however he was very asymptomatic with his atrial fibrillation and he is doing very well.   Chronic anticoagulation - Eliquis 2.5 mg twice a day , dose adjusted because of age   Pacemaker - functioning well. - Dr. Caryl Comes   Amiodarone use - continue to monitor TSH , complete metabolic profile.  Checking today.   Chronic systolic heart failure -  Low-dose carvedilol. Unable to tolerate higher doses because of low blood pressure. - clinically he is stable. No indication to check echocardiogram at this time. Fine status is normal.    Medication Adjustments/Labs and Tests Ordered: Current medicines are reviewed at length with the patient today.  Concerns regarding medicines are outlined above.  Medication changes, Labs and Tests ordered today are  listed in the Patient Instructions below. Patient Instructions  Medication Instructions:  The current medical regimen is effective;  continue present plan and medications.  Labwork: Please have blood work today (CBC,CMP, TSH and Free T4). Follow-Up: Follow up in 6 months with Dr. Marlou Porch.  You will receive a letter in the mail 2 months before you are due.  Please call us when you receive this letter to schedule your follow up appointment.  If you need a refill on your cardiac medications before your next appointment, please call your pharmacy.  Thank you for choosing [facility]!!            Signed, Candee Furbish, MD  06/22/2015 10:12 AM    [address], [place], [place]  [address] Phone: [phone]; Fax: [phone]

## 2015-06-27 IMAGING — CT CT HEAD W/O CM
5 series · 18 of 47 positions shown, 19 images · non-contrast
Comparison: None.

CLINICAL DATA: Fall, right wrist and neck pain.

EXAM:
CT HEAD WITHOUT CONTRAST
CT CERVICAL SPINE WITHOUT CONTRAST
TECHNIQUE: Multidetector CT imaging of the head and cervical spine was
performed following the standard protocol without intravenous
contrast. Multiplanar CT image reconstructions of the cervical spine
were also generated.

[Series 2: head 4.8 h37s · axial · 0.46mm/px · z∈[-98,-47]mm · 2 of 32 slices shown, 3 images]
[im 11/32  brain]
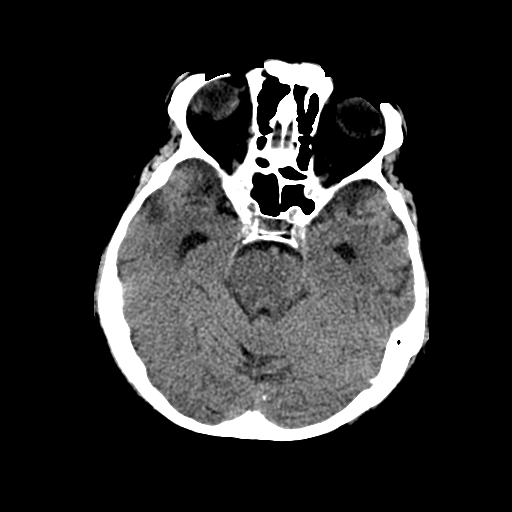
[im 11/32  bone]
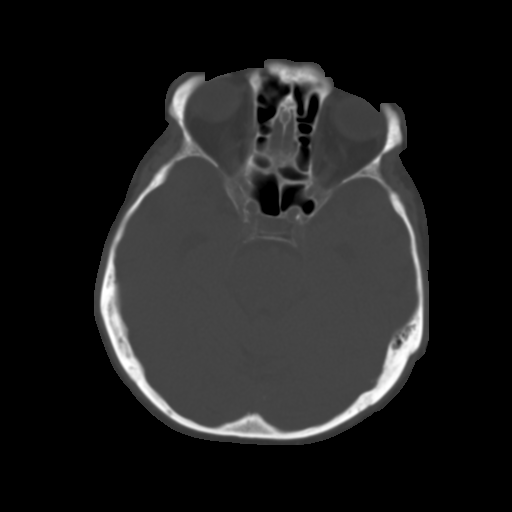
[im 21/32  brain]
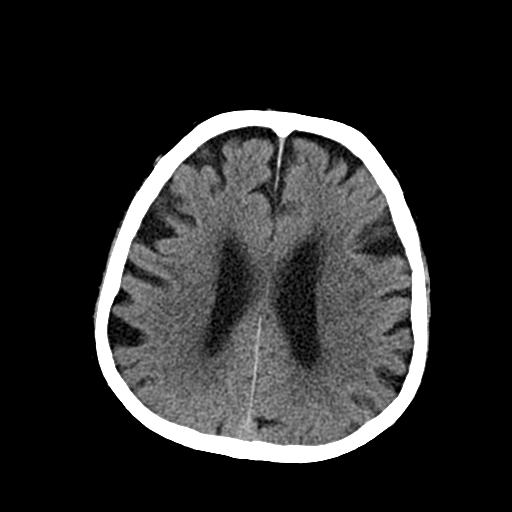

[Series 6: c_spine 2.0 b41s st · axial · 0.23mm/px · z∈[-296,-162]mm · 8 of 81 slices shown (1 of 2)]
[im 7/81  brain]
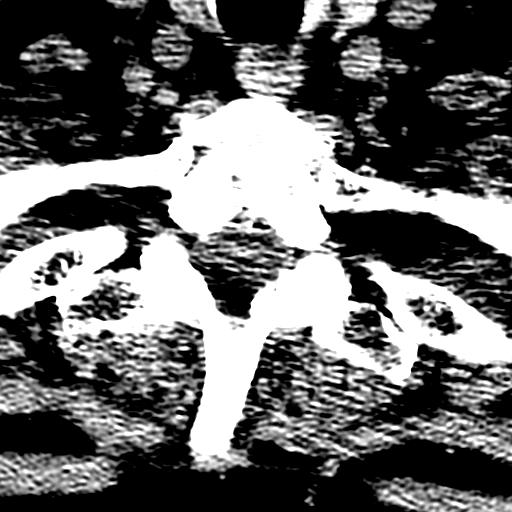
[im 21/81  brain]
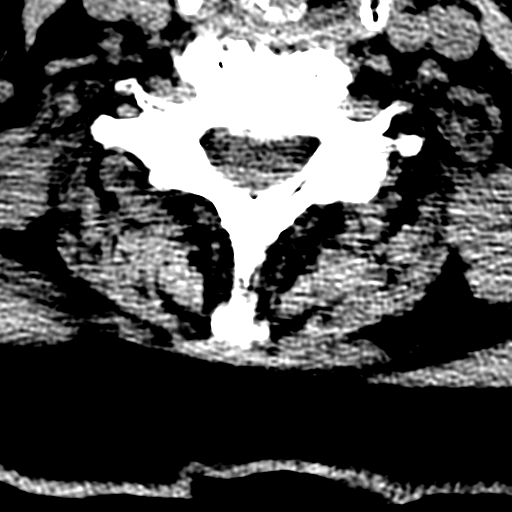
[im 27/81  brain]
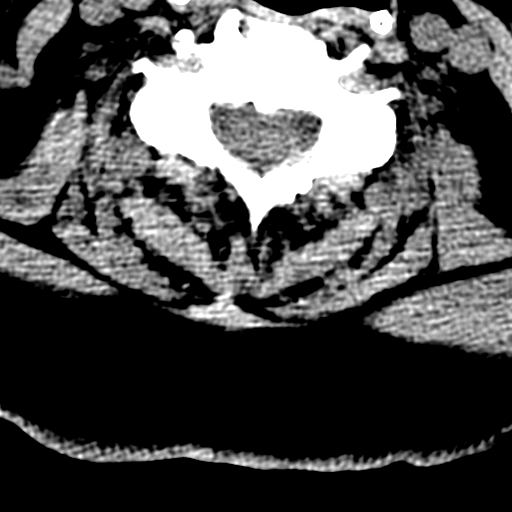
[im 34/81  brain]
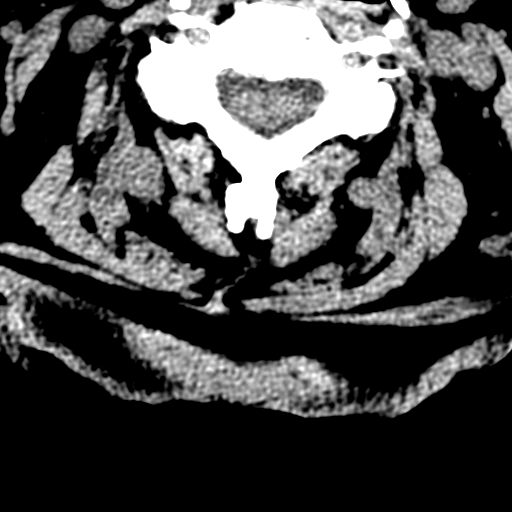
[im 47/81  brain]
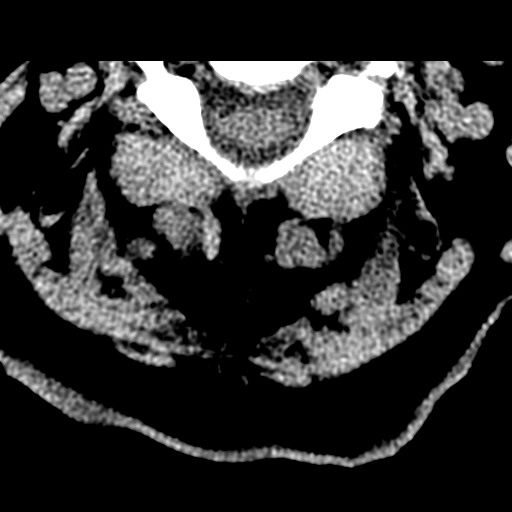
[im 54/81  brain]
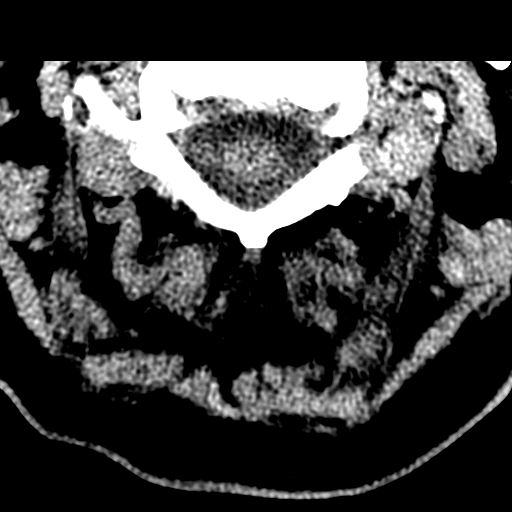
[im 61/81  brain]
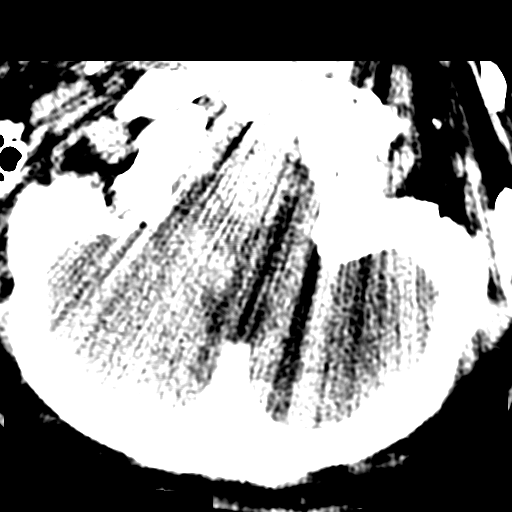
[im 74/81  brain]
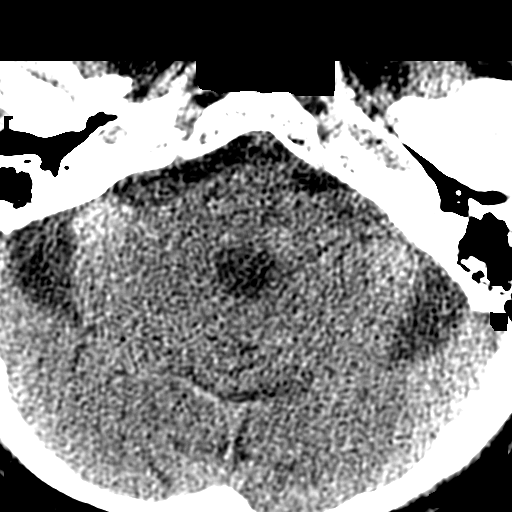

[Series 7: c_spine 2.0 b41s st · axial · 0.25mm/px · z∈[-296,-268]mm · 2 of 81 slices shown (2 of 2)]
[im 7/81  brain]
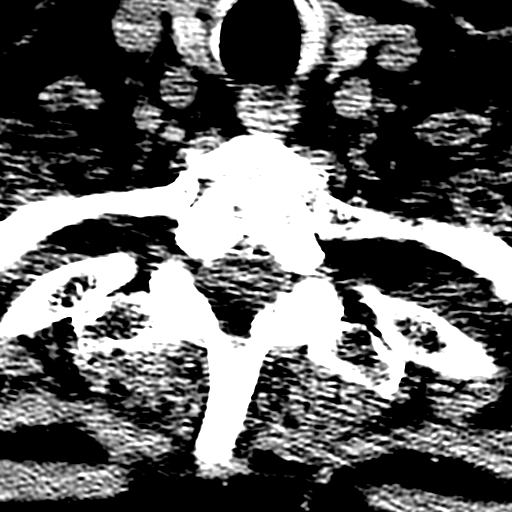
[im 21/81  brain]
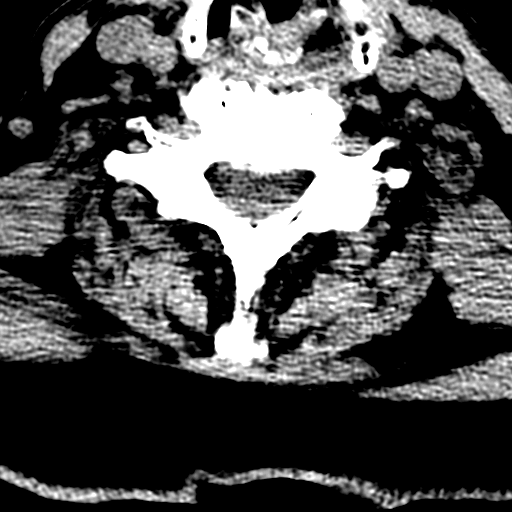

[Series 9: c_spine 2.0 coronal · coronal · 0.31mm/px · 3 of 40 slices shown]
[im 14/40  brain]
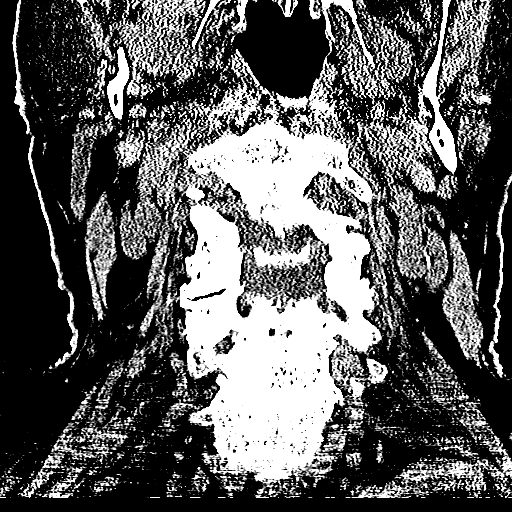
[im 18/40  brain]
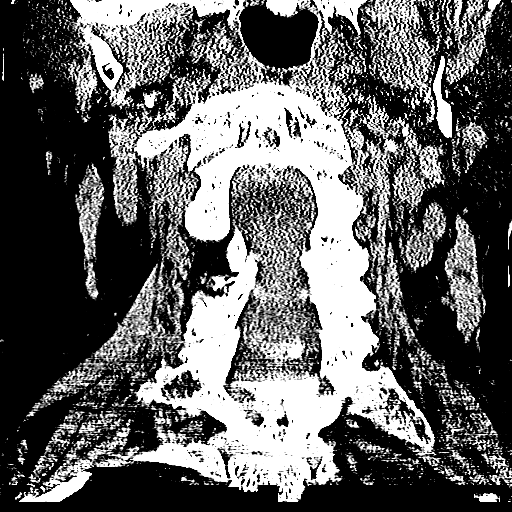
[im 22/40  brain]
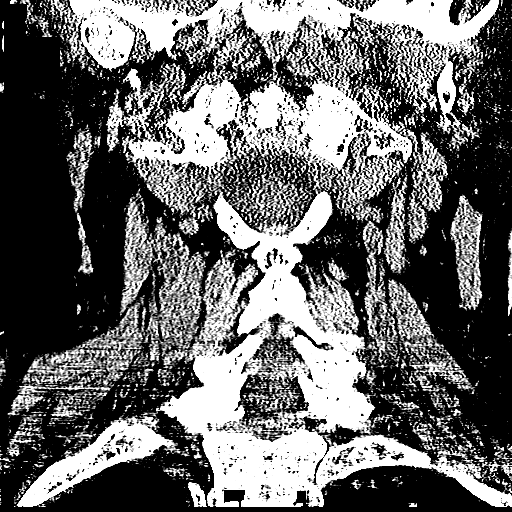

[Series 10: c_spine 2.0 sagittal · sagittal · 0.31mm/px · 3 of 40 slices shown]
[im 14/40  brain]
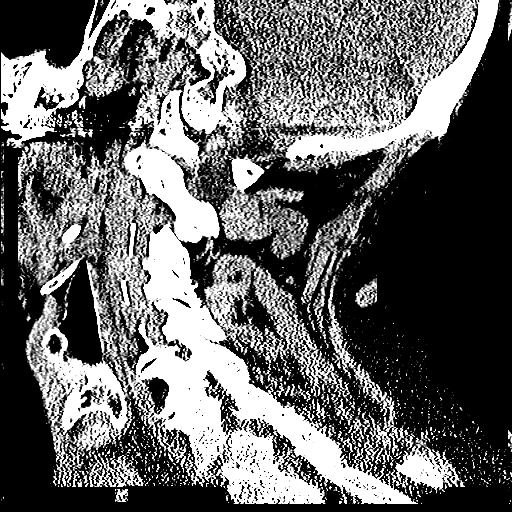
[im 20/40  brain]
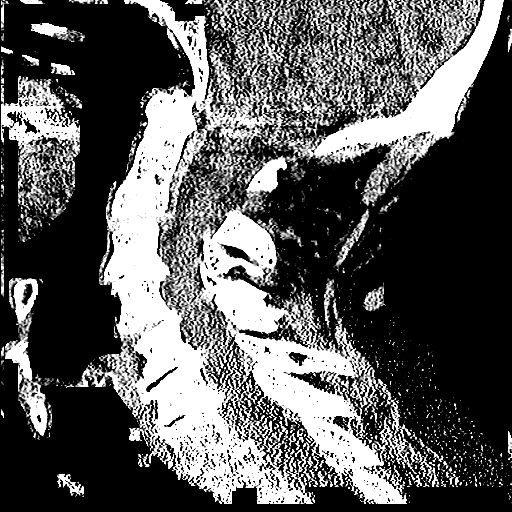
[im 27/40  brain]
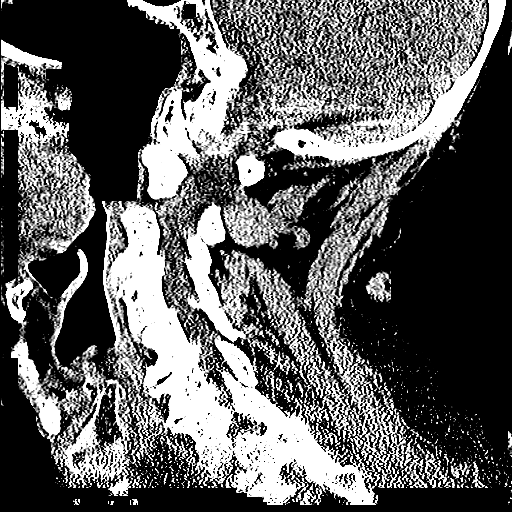

[18 of 47 positions shown; findings below may reference images not displayed]

FINDINGS: CT HEAD FINDINGS

There is atrophy and chronic small vessel disease changes. No acute
intracranial abnormality. Specifically, no hemorrhage,
hydrocephalus, mass lesion, acute infarction, or significant
intracranial injury. No acute calvarial abnormality. Visualized
paranasal sinuses and mastoids clear. Orbital soft tissues
unremarkable.

CT CERVICAL SPINE FINDINGS

Diffuse degenerative disc disease and facet disease throughout the
cervical spine. Alignment is normal. Prevertebral soft tissues are
normal. Central disc herniation noted at C2-3 without cord
compression. No fracture. No epidural or paraspinal hematoma.
IMPRESSION: No acute intracranial abnormality.

Atrophy, chronic microvascular disease.

Degenerative disc and facet disease throughout the cervical spine.
No acute bony abnormality.

C2-3 central disc herniation.

## 2015-07-01 ENCOUNTER — Other Ambulatory Visit: Payer: Self-pay | Admitting: Cardiology

## 2015-07-09 ENCOUNTER — Emergency Department (INDEPENDENT_AMBULATORY_CARE_PROVIDER_SITE_OTHER): Payer: Medicare Other

## 2015-07-09 ENCOUNTER — Emergency Department (INDEPENDENT_AMBULATORY_CARE_PROVIDER_SITE_OTHER)
Admission: EM | Admit: 2015-07-09 | Discharge: 2015-07-09 | Disposition: A | Discharge status: Home / Self Care | Payer: Medicare Other | Source: Home / Self Care | Attending: Family Medicine | Admitting: Family Medicine

## 2015-07-09 ENCOUNTER — Encounter (HOSPITAL_COMMUNITY): Payer: Self-pay | Admitting: Emergency Medicine

## 2015-07-09 DIAGNOSIS — M4850XA Collapsed vertebra, not elsewhere classified, site unspecified, initial encounter for fracture: Secondary | ICD-10-CM | POA: Diagnosis not present

## 2015-07-09 MED ORDER — HYDROCODONE-ACETAMINOPHEN 5-325 MG PO TABS: 1.0000 | ORAL_TABLET | Freq: Four times a day (QID) | ORAL | Status: DC | PRN

## 2015-07-09 NOTE — ED Provider Notes (Signed)
CSN: [id number]     Arrival date & time 07/09/15  1425 History   First MD Initiated Contact with Patient 07/09/15 1637     Chief Complaint  Patient presents with  . Back Pain   (Consider location/radiation/quality/duration/timing/severity/associated sxs/prior Treatment) Patient is a 82 y.o. male presenting with back pain. The history is provided by the patient.  Back Pain Location:  Lumbar spine Quality:  Shooting Radiates to:  Does not radiate Pain severity:  Moderate Onset quality:  Sudden Duration:  10 days Progression:  Worsening Chronicity:  New Context: not falling and not recent injury   Relieved by:  None tried Worsened by:  Nothing tried Ineffective treatments:  None tried Associated symptoms: no dysuria, no leg pain, no numbness and no paresthesias   Risk factors: hx of osteoporosis and lack of exercise     Past Medical History  Diagnosis Date  . Left bundle branch block   . CAD (coronary artery disease)   . Hypertension   . Gout   . Dyslipidemia   . Lung nodules   . COPD (chronic obstructive pulmonary disease) ([place])   . Mitral regurgitation   . Aortic insufficiency   . Shoulder pain   . Back pain     Back and knee pain, June, 2014  . Cardiomyopathy, EF by echo 25-30% 07/14/2013  . RBBB   . Atrial fibrillation ([place])   . Pacemaker   . Shortness of breath   . Pneumonia   . Arthritis    Past Surgical History  Procedure Laterality Date  . Back surgery    . Right shoulder    . Right knee surgery    . Tonsillectomy    . Coronary angioplasty    . Bi-ventricular pacemaker insertion (crt-p)  07-15-2013    STJ Quadra Allura CRTP implanted by Dr Caryl Comes for NICM, CHF, alternating bundle branch blocks  . Cardioversion N/A 09/13/2013    Procedure: CARDIOVERSION;  Surgeon: Carlena Bjornstad, MD;  Location: [facility] ENDOSCOPY;  Service: Cardiovascular;  Laterality: N/A;  . Cardioversion N/A 10/17/2013    Procedure: CARDIOVERSION;  Surgeon: Lelon Perla, MD;  Location: [facility]  ENDOSCOPY;  Service: Cardiovascular;  Laterality: N/A;  . Permanent pacemaker insertion N/A 07/15/2013    Procedure: PERMANENT PACEMAKER INSERTION;  Surgeon: Deboraha Sprang, MD;  Location: [facility] CATH LAB;  Service: Cardiovascular;  Laterality: N/A;   Family History  Problem Relation Age of Onset  . Heart attack Sister   . Lung cancer Brother     smoker  . Colon cancer Neg Hx    Social History  Substance Use Topics  . Smoking status: Never Smoker   . Smokeless tobacco: Never Used  . Alcohol Use: No    Review of Systems  Constitutional: Negative.   Gastrointestinal: Negative.   Genitourinary: Negative for dysuria.  Musculoskeletal: Positive for back pain, arthralgias and gait problem.  Skin: Negative.   Neurological: Negative for numbness and paresthesias.  All other systems reviewed and are negative.   Allergies  Review of patient's allergies indicates no known allergies.  Home Medications   Prior to Admission medications   Medication Sig Start Date End Date Taking? Authorizing Provider  amiodarone (PACERONE) 200 MG tablet Take 1 tablet (200 mg total) by mouth daily. 01/07/15   Carlena Bjornstad, MD  carvedilol (COREG) 3.125 MG tablet Take 1 tablet (3.125 mg total) by mouth 2 (two) times daily with a meal. 10/24/13   Delfina Redwood, MD  ELIQUIS 2.5 MG TABS  tablet TAKE 1 TABLET (2.5 MG TOTAL) BY MOUTH 2 (TWO) TIMES DAILY. 08/18/14   Deboraha Sprang, MD  fenofibrate (TRICOR) 48 MG tablet TAKE 1/2 TABLET BY MOUTH DAILY 07/01/15   Jerline Pain, MD  fish oil-omega-3 fatty acids 1000 MG capsule Take 2 g by mouth 2 (two) times daily.     Historical Provider, MD  furosemide (LASIX) 40 MG tablet TAKE 1 TABLET (40 MG TOTAL) BY MOUTH 2 (TWO) TIMES DAILY. 03/17/15   Carlena Bjornstad, MD  HYDROcodone-acetaminophen (NORCO/VICODIN) 5-325 MG tablet Take 1 tablet by mouth every 6 (six) hours as needed for moderate pain. 07/09/15   Billy Fischer, MD  levothyroxine (SYNTHROID, LEVOTHROID) 75 MCG tablet  Take 1 tablet (75 mcg total) by mouth daily. 04/21/14   Carlena Bjornstad, MD  Multiple Vitamins-Minerals (OCUVITE PO) Take 2 tablets by mouth daily.     Historical Provider, MD  omeprazole (PRILOSEC) 20 MG capsule Take 20 mg by mouth daily.     Historical Provider, MD  predniSONE (DELTASONE) 10 MG tablet Take 10 mg by mouth daily. 01/13/15   Historical Provider, MD  sodium chloride (OCEAN) 0.65 % SOLN nasal spray Place 1 spray into both nostrils daily as needed for congestion.     Historical Provider, MD  tamsulosin (FLOMAX) 0.4 MG CAPS capsule Take 0.4 mg by mouth 2 (two) times daily.     Historical Provider, MD  triazolam (HALCION) 0.25 MG tablet Take 0.5 tablets (0.125 mg total) by mouth at bedtime as needed for sleep. 10/24/13   Delfina Redwood, MD   Meds Ordered and Administered this Visit  Medications - No data to display  BP 154/77 mmHg  Pulse 81  Resp 24  SpO2 98% No data found.   Physical Exam  Constitutional: He is oriented to person, place, and time. He appears well-developed and well-nourished. No distress.  In wheelchair.  Abdominal: Soft. Bowel sounds are normal. He exhibits no distension. There is no tenderness.  Musculoskeletal: He exhibits tenderness.  Neurological: He is alert and oriented to person, place, and time.  Skin: Skin is warm and dry.  Nursing note and vitals reviewed.   ED Course  Procedures (including critical care time)  Labs Review Labs Reviewed - No data to display  Imaging Review Dg Lumbar Spine Complete  07/09/2015  CLINICAL DATA:  Left hip/low back pain.  No reported injury. EXAM: LUMBAR SPINE - COMPLETE 4+ VIEW COMPARISON:  02/19/2015 CT abdomen/pelvis. FINDINGS: This report assumes 5 non rib-bearing lumbar vertebrae. Stable long segment S-shaped scoliosis in the thoracolumbar spine. There is a mild compression deformity of the superior endplate of the L1 vertebral body, with approximately 20-30% loss of vertebral height anteriorly, which appears  new since 02/19/2015. Remaining lumbar vertebral body heights appear preserved. No additional fracture. Severe degenerative disc disease throughout the lumbar spine, unchanged. Stable 4 mm retrolisthesis at L1-2 and 3 mm anterolisthesis at L3-4. No acute malalignment. Marked facet arthropathy bilaterally throughout the lumbar spine, unchanged. Lumbar neural foramina are poorly evaluated at radiography due to scoliosis. No aggressive appearing focal osseous lesions. IMPRESSION: 1. Mild superior L1 vertebral compression fracture, new since 02/19/2015. 2. Otherwise stable lumbar spine radiographs with long segment S shaped thoracolumbar scoliosis and severe degenerative changes as described. Electronically Signed   By: Ilona Sorrel M.D.   On: 07/09/2015 17:21     Visual Acuity Review  Right Eye Distance:   Left Eye Distance:   Bilateral Distance:    Right Eye  Near:   Left Eye Near:    Bilateral Near:         MDM   1. Compression fracture of spine, non-traumatic, initial encounter [facility])    Discussed with dr Rosana Fret, advised lumbar corset, lmd follow, will see if needed.   Billy Fischer, MD 07/09/15 2111

## 2015-07-09 NOTE — ED Notes (Signed)
Right back pain, onset 3/12.  Pain has increased and more constant since onset.  No fall.

## 2015-07-09 NOTE — Discharge Instructions (Signed)
Wear back support as needed, see dr green or specialist as needed.

## 2015-07-09 NOTE — ED Notes (Signed)
Assisted pt getting dressed.

## 2015-07-15 ENCOUNTER — Encounter: Payer: Self-pay | Admitting: Gastroenterology

## 2015-08-03 ENCOUNTER — Other Ambulatory Visit: Payer: Self-pay | Admitting: Cardiology

## 2015-08-09 ENCOUNTER — Other Ambulatory Visit: Payer: Self-pay | Admitting: Internal Medicine

## 2015-09-21 ENCOUNTER — Encounter: Payer: Self-pay | Admitting: Internal Medicine

## 2015-09-21 ENCOUNTER — Ambulatory Visit (INDEPENDENT_AMBULATORY_CARE_PROVIDER_SITE_OTHER): Payer: Medicare Other | Admitting: Internal Medicine

## 2015-09-21 ENCOUNTER — Ambulatory Visit (INDEPENDENT_AMBULATORY_CARE_PROVIDER_SITE_OTHER)
Admission: RE | Admit: 2015-09-21 | Discharge: 2015-09-21 | Disposition: A | Discharge status: Home / Self Care | Payer: Medicare Other | Source: Ambulatory Visit | Attending: Internal Medicine | Admitting: Internal Medicine

## 2015-09-21 VITALS — BP 116/68 | HR 76 | Ht 69.0 in | Wt 181.2 lb

## 2015-09-21 DIAGNOSIS — R0600 Dyspnea, unspecified: Secondary | ICD-10-CM | POA: Diagnosis not present

## 2015-09-21 DIAGNOSIS — Z950 Presence of cardiac pacemaker: Secondary | ICD-10-CM

## 2015-09-21 DIAGNOSIS — I5022 Chronic systolic (congestive) heart failure: Secondary | ICD-10-CM

## 2015-09-21 DIAGNOSIS — I480 Paroxysmal atrial fibrillation: Secondary | ICD-10-CM

## 2015-09-21 NOTE — Progress Notes (Signed)
Patient Care Team: [name], MD as PCP - General (Internal Medicine)   HPI  Levi Lewis is a 82 y.o. male Seen in followup for CRT P. implantation 3/15.this occurred in the context of paroxysmal atrial fibrillation and alternating bundle branch block.  EF 25-30% 3/15  He takes apixoban and amiodarone   amiodarone surveillance laboratories were normal 3/17 regular much  Saw Dr [facility] 3/17  Also exertional shortness of breath;  he does not have any peripheral edema  Having dizziness occasionally during the day withstanding.   Past Medical History  Diagnosis Date  . Left bundle branch block   . CAD (coronary artery disease)   . Hypertension   . Gout   . Dyslipidemia   . Lung nodules   . COPD (chronic obstructive pulmonary disease) ([place])   . Mitral regurgitation   . Aortic insufficiency   . Shoulder pain   . Back pain     Back and knee pain, June, 2014  . Cardiomyopathy, EF by echo 25-30% 07/14/2013  . RBBB   . Atrial fibrillation ([place])   . Pacemaker   . Shortness of breath   . Pneumonia   . Arthritis     Past Surgical History  Procedure Laterality Date  . Back surgery    . Right shoulder    . Right knee surgery    . Tonsillectomy    . Coronary angioplasty    . Bi-ventricular pacemaker insertion (crt-p)  07-15-2013    STJ Quadra Allura CRTP implanted by Dr Caryl Comes for NICM, CHF, alternating bundle branch blocks  . Cardioversion N/A 09/13/2013    Procedure: CARDIOVERSION;  Surgeon: Carlena Bjornstad, MD;  Location: [facility] ENDOSCOPY;  Service: Cardiovascular;  Laterality: N/A;  . Cardioversion N/A 10/17/2013    Procedure: CARDIOVERSION;  Surgeon: Lelon Perla, MD;  Location: [facility] ENDOSCOPY;  Service: Cardiovascular;  Laterality: N/A;  . Permanent pacemaker insertion N/A 07/15/2013    Procedure: PERMANENT PACEMAKER INSERTION;  Surgeon: Deboraha Sprang, MD;  Location: [facility] CATH LAB;  Service: Cardiovascular;  Laterality: N/A;    Current Outpatient Prescriptions    Medication Sig Dispense Refill  . amiodarone (PACERONE) 200 MG tablet TAKE 1 TABLET (200 MG TOTAL) BY MOUTH DAILY. 30 tablet 5  . carvedilol (COREG) 3.125 MG tablet Take 1 tablet (3.125 mg total) by mouth 2 (two) times daily with a meal. 60 tablet 1  . ELIQUIS 2.5 MG TABS tablet TAKE 1 TABLET (2.5 MG TOTAL) BY MOUTH 2 (TWO) TIMES DAILY. 60 tablet 1  . fenofibrate (TRICOR) 48 MG tablet TAKE 1/2 TABLET BY MOUTH DAILY 15 tablet 5  . fish oil-omega-3 fatty acids 1000 MG capsule Take 2 g by mouth 2 (two) times daily.     . furosemide (LASIX) 40 MG tablet Take 40 mg by mouth. 1 tablet in the morning and 1/2 tablet in the afternoon.    . gabapentin (NEURONTIN) 300 MG capsule Take 300 mg by mouth 2 (two) times daily.  12  . HYDROcodone-acetaminophen (NORCO/VICODIN) 5-325 MG tablet Take 1 tablet by mouth every 6 (six) hours as needed for moderate pain. 10 tablet 0  . levothyroxine (SYNTHROID, LEVOTHROID) 75 MCG tablet Take 1 tablet (75 mcg total) by mouth daily. 30 tablet 6  . Multiple Vitamins-Minerals (OCUVITE PO) Take 2 tablets by mouth daily.     Marland Kitchen omeprazole (PRILOSEC) 20 MG capsule Take 20 mg by mouth daily.     . predniSONE (DELTASONE) 10 MG tablet Take  5 mg by mouth daily.  12  . sodium chloride (OCEAN) 0.65 % SOLN nasal spray Place 1 spray into both nostrils daily as needed for congestion.     . tamsulosin (FLOMAX) 0.4 MG CAPS capsule Take 0.4 mg by mouth 2 (two) times daily.     . triazolam (HALCION) 0.25 MG tablet Take 0.5 tablets (0.125 mg total) by mouth at bedtime as needed for sleep. 15 tablet 0   No current facility-administered medications for this visit.    No Known Allergies  Review of Systems negative except from HPI and PMH  Physical Exam BP 116/68 mmHg  Pulse 76  Ht 5\' 9"  (1.753 m)  Wt 181 lb 3.2 oz (82.192 kg)  BMI 26.75 kg/m2 Well developed and well nourished in no acute distress HENT normal E scleral and icterus clear Neck Supple JVP flat; carotids brisk and  full Clear to ausculation  Regular rate and rhythm, no murmurs gallops or rub Soft with active bowel sounds No clubbing cyanosis  Edema Alert and oriented, grossly normal motor and sensory function Skin Warm and Dry  ECG demonstrates P-synchronous/ AV  pacing   Assessment and  Plan  Atrial fib/flutter  High Risk Medication   Amiodarone  Atrial lead failure  HFrEF  Dizziness  Alternating bundle branch block  Dyspnea  Chest pain-nonexertional   CRT-P  St Jude    No intercurrent atrial fibrillation  Euvolemic continue current meds  Device reports of atrial fibrillation artifact sinus, atrial rhythm blanket and atrial pacing. We have reprogrammed the device DDD R. At this juncture he has no evidence of retrograde conduction. Antegrade conduction ranges from 325 at about 60 bpm---600 ms at 90 bpm. We have reprogrammed his AV delay 180---225. If his symptoms don't abate, will plan to undertake AV optimization echo.  He is also having some dizziness. We will change his carvedilol to once a day and to take it at night.   His dyspnea may be related to heart failure. The timing issues are as above. He is also on amiodarone. His lung exam is not normal. We'll undertake a chest x-ray. I wonder whether this represents a new lung toxicity. A low threshold for discontinuing the drug.

## 2015-09-21 NOTE — Patient Instructions (Signed)
Medication Instructions: - Your physician has recommended you make the following change in your medication:  1) Decrease coreg (carvedilol) to 3.125 mg one tablet by mouth every evening  Labwork: - none  Procedures/Testing: - A chest x-ray takes a picture of the organs and structures inside the chest, including the heart, lungs, and blood vessels. This test can show several things, including, whether the heart is enlarges; whether fluid is building up in the lungs; and whether pacemaker / defibrillator leads are still in place.  Follow-Up: - Dr. Olin Pia nurse, Nira Conn, will call you in about 2 weeks (the week of 10/05/15) to follow up on you breathing- if you are no better, we will schedule you to have an AV optimization echo done in the office.  - Remote monitoring is used to monitor your Pacemaker of ICD from home. This monitoring reduces the number of office visits required to check your device to one time per year. It allows [place] to keep an eye on the functioning of your device to ensure it is working properly. You are scheduled for a device check from home on 12/22/15. You may send your transmission at any time that day. If you have a wireless device, the transmission will be sent automatically. After your physician reviews your transmission, you will receive a postcard with your next transmission date.  - Your physician wants you to follow-up in: 1 year with Dr. Caryl Comes. You will receive a reminder letter in the mail two months in advance. If you don't receive a letter, please call our office to schedule the follow-up appointment.  Any Additional Special Instructions Will Be Listed Below (If Applicable).     If you need a refill on your cardiac medications before your next appointment, please call your pharmacy.

## 2015-09-22 ENCOUNTER — Telehealth: Payer: Self-pay | Admitting: Internal Medicine

## 2015-09-22 NOTE — Telephone Encounter (Signed)
Please Inform Patient that     Chest x-ray shows some abnormalities that may be related to much fluid. Please increase her furosemide from 40/20--80/40 for 3 days.    Also please have him stop amiodarone    Thanks       Pacemaker external - parameters  Status: Finalresult Visible to patient:  MyChart Nextappt: 12/22/2015 at 10:30 AM in Cardiology ([facility] Device Remotes)       Notes Recorded by Emily Filbert, RN on 09/22/2015 at 9:39 AM I called to notify the patient of his results and Dr. Olin Pia recommendations. He verbalized understanding of most recommendations, but is hard of hearing. I advised him I would call his son, Ronalee Belts, to discuss further. The patient was agreeable with this.  Attempted to call Ronalee Belts- I left a message for him to please call me back.             I spoke with the patient's son, Ronalee Belts, and he is aware of Dr. Olin Pia recommendations. He verbalizes understanding. I will f/u with the patient by phone in about 2 weeks per Dr. Caryl Comes to reassess his symptoms.

## 2015-10-05 ENCOUNTER — Other Ambulatory Visit: Payer: Self-pay | Admitting: Internal Medicine

## 2015-10-14 ENCOUNTER — Encounter: Payer: Self-pay | Admitting: Internal Medicine

## 2015-10-18 ENCOUNTER — Encounter (HOSPITAL_COMMUNITY): Payer: Self-pay

## 2015-10-18 ENCOUNTER — Emergency Department (HOSPITAL_COMMUNITY): Payer: Medicare Other

## 2015-10-18 ENCOUNTER — Inpatient Hospital Stay (HOSPITAL_COMMUNITY)
Admission: EM | Admit: 2015-10-18 | Discharge: 2015-10-21 | DRG: 291 | Disposition: A | Discharge status: Home Health | Payer: Medicare Other | Attending: Internal Medicine | Admitting: Internal Medicine

## 2015-10-18 ENCOUNTER — Observation Stay (HOSPITAL_COMMUNITY): Payer: Medicare Other

## 2015-10-18 DIAGNOSIS — R627 Adult failure to thrive: Secondary | ICD-10-CM | POA: Diagnosis present

## 2015-10-18 DIAGNOSIS — T148 Other injury of unspecified body region: Secondary | ICD-10-CM | POA: Diagnosis not present

## 2015-10-18 DIAGNOSIS — J9601 Acute respiratory failure with hypoxia: Secondary | ICD-10-CM | POA: Diagnosis not present

## 2015-10-18 DIAGNOSIS — Z950 Presence of cardiac pacemaker: Secondary | ICD-10-CM | POA: Diagnosis present

## 2015-10-18 DIAGNOSIS — K219 Gastro-esophageal reflux disease without esophagitis: Secondary | ICD-10-CM | POA: Diagnosis present

## 2015-10-18 DIAGNOSIS — I429 Cardiomyopathy, unspecified: Secondary | ICD-10-CM

## 2015-10-18 DIAGNOSIS — N184 Chronic kidney disease, stage 4 (severe): Secondary | ICD-10-CM | POA: Diagnosis present

## 2015-10-18 DIAGNOSIS — Z9861 Coronary angioplasty status: Secondary | ICD-10-CM

## 2015-10-18 DIAGNOSIS — I480 Paroxysmal atrial fibrillation: Secondary | ICD-10-CM | POA: Diagnosis present

## 2015-10-18 DIAGNOSIS — I5022 Chronic systolic (congestive) heart failure: Secondary | ICD-10-CM

## 2015-10-18 DIAGNOSIS — S0990XA Unspecified injury of head, initial encounter: Secondary | ICD-10-CM | POA: Diagnosis present

## 2015-10-18 DIAGNOSIS — Y92002 Bathroom of unspecified non-institutional (private) residence single-family (private) house as the place of occurrence of the external cause: Secondary | ICD-10-CM

## 2015-10-18 DIAGNOSIS — E039 Hypothyroidism, unspecified: Secondary | ICD-10-CM | POA: Diagnosis present

## 2015-10-18 DIAGNOSIS — I2510 Atherosclerotic heart disease of native coronary artery without angina pectoris: Secondary | ICD-10-CM | POA: Diagnosis present

## 2015-10-18 DIAGNOSIS — Z7901 Long term (current) use of anticoagulants: Secondary | ICD-10-CM

## 2015-10-18 DIAGNOSIS — I5023 Acute on chronic systolic (congestive) heart failure: Secondary | ICD-10-CM | POA: Insufficient documentation

## 2015-10-18 DIAGNOSIS — Z7952 Long term (current) use of systemic steroids: Secondary | ICD-10-CM

## 2015-10-18 DIAGNOSIS — I080 Rheumatic disorders of both mitral and aortic valves: Secondary | ICD-10-CM | POA: Diagnosis present

## 2015-10-18 DIAGNOSIS — W19XXXA Unspecified fall, initial encounter: Secondary | ICD-10-CM

## 2015-10-18 DIAGNOSIS — E785 Hyperlipidemia, unspecified: Secondary | ICD-10-CM | POA: Diagnosis present

## 2015-10-18 DIAGNOSIS — I130 Hypertensive heart and chronic kidney disease with heart failure and stage 1 through stage 4 chronic kidney disease, or unspecified chronic kidney disease: Secondary | ICD-10-CM | POA: Diagnosis not present

## 2015-10-18 DIAGNOSIS — Z9229 Personal history of other drug therapy: Secondary | ICD-10-CM

## 2015-10-18 DIAGNOSIS — T07XXXA Unspecified multiple injuries, initial encounter: Secondary | ICD-10-CM

## 2015-10-18 DIAGNOSIS — M1990 Unspecified osteoarthritis, unspecified site: Secondary | ICD-10-CM | POA: Diagnosis present

## 2015-10-18 DIAGNOSIS — J449 Chronic obstructive pulmonary disease, unspecified: Secondary | ICD-10-CM | POA: Diagnosis present

## 2015-10-18 DIAGNOSIS — I447 Left bundle-branch block, unspecified: Secondary | ICD-10-CM | POA: Diagnosis present

## 2015-10-18 HISTORY — DX: Gastro-esophageal reflux disease without esophagitis: K21.9

## 2015-10-18 HISTORY — DX: Hypothyroidism, unspecified: E03.9

## 2015-10-18 HISTORY — DX: Low back pain: M54.5

## 2015-10-18 HISTORY — DX: Other chronic pain: G89.29

## 2015-10-18 HISTORY — DX: Heart failure, unspecified: I50.9

## 2015-10-18 HISTORY — DX: Low back pain, unspecified: M54.50

## 2015-10-18 LAB — URINALYSIS, ROUTINE W REFLEX MICROSCOPIC
GLUCOSE, UA: NEGATIVE mg/dL
HGB URINE DIPSTICK: NEGATIVE
Ketones, ur: NEGATIVE mg/dL
Leukocytes, UA: NEGATIVE
Nitrite: NEGATIVE
Protein, ur: NEGATIVE mg/dL
SPECIFIC GRAVITY, URINE: 1.012 (ref 1.005–1.030)
pH: 7 (ref 5.0–8.0)

## 2015-10-18 LAB — CBC WITH DIFFERENTIAL/PLATELET
BASOS ABS: 0 10*3/uL (ref 0.0–0.1)
Basophils Relative: 0 %
EOS ABS: 0.1 10*3/uL (ref 0.0–0.7)
EOS PCT: 1 %
HCT: 38.3 % — ABNORMAL LOW (ref 39.0–52.0)
Hemoglobin: 12.5 g/dL — ABNORMAL LOW (ref 13.0–17.0)
LYMPHS ABS: 1.9 10*3/uL (ref 0.7–4.0)
Lymphocytes Relative: 18 %
MCH: 31.3 pg (ref 26.0–34.0)
MCHC: 32.6 g/dL (ref 30.0–36.0)
MCV: 96 fL (ref 78.0–100.0)
Monocytes Absolute: 1 10*3/uL (ref 0.1–1.0)
Monocytes Relative: 10 %
Neutro Abs: 7.2 10*3/uL (ref 1.7–7.7)
Neutrophils Relative %: 71 %
PLATELETS: 247 10*3/uL (ref 150–400)
RBC: 3.99 MIL/uL — AB (ref 4.22–5.81)
RDW: 14 % (ref 11.5–15.5)
WBC: 10.2 10*3/uL (ref 4.0–10.5)

## 2015-10-18 LAB — BASIC METABOLIC PANEL
Anion gap: 8 (ref 5–15)
BUN: 24 mg/dL — AB (ref 6–20)
CHLORIDE: 112 mmol/L — AB (ref 101–111)
CO2: 21 mmol/L — ABNORMAL LOW (ref 22–32)
CREATININE: 1.95 mg/dL — AB (ref 0.61–1.24)
Calcium: 9 mg/dL (ref 8.9–10.3)
GFR calc Af Amer: 32 mL/min — ABNORMAL LOW (ref >60)
GFR, EST NON AFRICAN AMERICAN: 27 mL/min — AB (ref >60)
Glucose, Bld: 121 mg/dL — ABNORMAL HIGH (ref 65–99)
POTASSIUM: 4.1 mmol/L (ref 3.5–5.1)
SODIUM: 141 mmol/L (ref 135–145)

## 2015-10-18 LAB — BRAIN NATRIURETIC PEPTIDE: B Natriuretic Peptide: 721.7 pg/mL — ABNORMAL HIGH (ref 0.0–100.0)

## 2015-10-18 LAB — D-DIMER, QUANTITATIVE: D-Dimer, Quant: 1.43 ug/mL-FEU — ABNORMAL HIGH (ref 0.00–0.50)

## 2015-10-18 MED ORDER — FUROSEMIDE 10 MG/ML IJ SOLN
20.0000 mg | Freq: Once | INTRAMUSCULAR | Status: AC
  Administered 2015-10-18: 20 mg via INTRAVENOUS
  Filled 2015-10-18: qty 2

## 2015-10-18 MED ORDER — PANTOPRAZOLE SODIUM 40 MG PO TBEC
40.0000 mg | DELAYED_RELEASE_TABLET | Freq: Every day | ORAL | Status: DC
  Administered 2015-10-19 – 2015-10-21 (×3): 40 mg via ORAL
  Filled 2015-10-18 (×3): qty 1

## 2015-10-18 MED ORDER — BISACODYL 10 MG RE SUPP: 10.0000 mg | Freq: Every day | RECTAL | Status: DC | PRN

## 2015-10-18 MED ORDER — FUROSEMIDE 40 MG PO TABS
40.0000 mg | ORAL_TABLET | Freq: Every day | ORAL | Status: DC
  Administered 2015-10-19 – 2015-10-21 (×3): 40 mg via ORAL
  Filled 2015-10-18 (×3): qty 1

## 2015-10-18 MED ORDER — FUROSEMIDE 20 MG PO TABS
20.0000 mg | ORAL_TABLET | Freq: Every day | ORAL | Status: DC
  Administered 2015-10-18 – 2015-10-20 (×3): 20 mg via ORAL
  Filled 2015-10-18 (×3): qty 1

## 2015-10-18 MED ORDER — TECHNETIUM TC 99M DIETHYLENETRIAME-PENTAACETIC ACID: 32.7000 | Freq: Once | INTRAVENOUS | Status: DC | PRN

## 2015-10-18 MED ORDER — TAMSULOSIN HCL 0.4 MG PO CAPS
0.4000 mg | ORAL_CAPSULE | Freq: Two times a day (BID) | ORAL | Status: DC
  Administered 2015-10-18 – 2015-10-21 (×6): 0.4 mg via ORAL
  Filled 2015-10-18 (×6): qty 1

## 2015-10-18 MED ORDER — ACETAMINOPHEN 325 MG PO TABS
650.0000 mg | ORAL_TABLET | Freq: Once | ORAL | Status: AC
  Administered 2015-10-18: 650 mg via ORAL
  Filled 2015-10-18: qty 2

## 2015-10-18 MED ORDER — GABAPENTIN 300 MG PO CAPS
300.0000 mg | ORAL_CAPSULE | Freq: Two times a day (BID) | ORAL | Status: DC
  Administered 2015-10-19 – 2015-10-21 (×5): 300 mg via ORAL
  Filled 2015-10-18 (×5): qty 1

## 2015-10-18 MED ORDER — ACETAMINOPHEN 650 MG RE SUPP: 650.0000 mg | Freq: Four times a day (QID) | RECTAL | Status: DC | PRN

## 2015-10-18 MED ORDER — TECHNETIUM TO 99M ALBUMIN AGGREGATED
4.2800 | Freq: Once | INTRAVENOUS | Status: AC | PRN
  Administered 2015-10-18: 4 via INTRAVENOUS

## 2015-10-18 MED ORDER — APIXABAN 2.5 MG PO TABS
2.5000 mg | ORAL_TABLET | Freq: Two times a day (BID) | ORAL | Status: DC
  Administered 2015-10-18 – 2015-10-21 (×6): 2.5 mg via ORAL
  Filled 2015-10-18 (×6): qty 1

## 2015-10-18 MED ORDER — TRAMADOL HCL 50 MG PO TABS
50.0000 mg | ORAL_TABLET | Freq: Once | ORAL | Status: AC
  Administered 2015-10-18: 50 mg via ORAL
  Filled 2015-10-18: qty 1

## 2015-10-18 MED ORDER — SODIUM CHLORIDE 0.9 % IV SOLN: INTRAVENOUS | Status: DC

## 2015-10-18 MED ORDER — SODIUM CHLORIDE 0.9% FLUSH
3.0000 mL | Freq: Two times a day (BID) | INTRAVENOUS | Status: DC
  Administered 2015-10-18 – 2015-10-21 (×5): 3 mL via INTRAVENOUS

## 2015-10-18 MED ORDER — FUROSEMIDE 20 MG PO TABS: 20.0000 mg | ORAL_TABLET | Freq: Every day | ORAL | Status: DC

## 2015-10-18 MED ORDER — PROCHLORPERAZINE EDISYLATE 5 MG/ML IJ SOLN
10.0000 mg | Freq: Four times a day (QID) | INTRAMUSCULAR | Status: DC | PRN
  Filled 2015-10-18 (×2): qty 2

## 2015-10-18 MED ORDER — ACETAMINOPHEN 325 MG PO TABS: 650.0000 mg | ORAL_TABLET | Freq: Four times a day (QID) | ORAL | Status: DC | PRN

## 2015-10-18 MED ORDER — CARVEDILOL 3.125 MG PO TABS
3.1250 mg | ORAL_TABLET | Freq: Every day | ORAL | Status: DC
  Administered 2015-10-18 – 2015-10-19 (×2): 3.125 mg via ORAL
  Filled 2015-10-18 (×2): qty 1

## 2015-10-18 MED ORDER — GUAIFENESIN ER 600 MG PO TB12: 600.0000 mg | ORAL_TABLET | Freq: Two times a day (BID) | ORAL | Status: DC | PRN

## 2015-10-18 MED ORDER — LEVOTHYROXINE SODIUM 75 MCG PO TABS
75.0000 ug | ORAL_TABLET | Freq: Every day | ORAL | Status: DC
  Administered 2015-10-19 – 2015-10-21 (×3): 75 ug via ORAL
  Filled 2015-10-18 (×3): qty 1

## 2015-10-18 MED ORDER — PANTOPRAZOLE SODIUM 40 MG PO TBEC: 40.0000 mg | DELAYED_RELEASE_TABLET | Freq: Every day | ORAL | Status: DC

## 2015-10-18 MED ORDER — MAGNESIUM CITRATE PO SOLN: 1.0000 | Freq: Once | ORAL | Status: DC | PRN

## 2015-10-18 MED ORDER — HYDROCODONE-ACETAMINOPHEN 5-325 MG PO TABS
1.0000 | ORAL_TABLET | ORAL | Status: DC | PRN
  Administered 2015-10-18: 2 via ORAL
  Administered 2015-10-18: 1 via ORAL
  Administered 2015-10-19 (×3): 2 via ORAL
  Administered 2015-10-20: 1 via ORAL
  Administered 2015-10-20: 2 via ORAL
  Filled 2015-10-18: qty 1
  Filled 2015-10-18 (×3): qty 2
  Filled 2015-10-18: qty 1
  Filled 2015-10-18 (×2): qty 2

## 2015-10-18 MED ORDER — TRIAZOLAM 0.125 MG PO TABS
0.1250 mg | ORAL_TABLET | Freq: Every evening | ORAL | Status: DC | PRN
  Administered 2015-10-18 – 2015-10-20 (×3): 0.125 mg via ORAL
  Filled 2015-10-18 (×5): qty 1

## 2015-10-18 MED ORDER — SENNOSIDES-DOCUSATE SODIUM 8.6-50 MG PO TABS
1.0000 | ORAL_TABLET | Freq: Every evening | ORAL | Status: DC | PRN
  Administered 2015-10-19: 1 via ORAL
  Filled 2015-10-18: qty 1

## 2015-10-18 NOTE — ED Notes (Signed)
Pt. Transported to xray at this time.  

## 2015-10-18 NOTE — ED Notes (Signed)
O2 state low, place PT on 2L via [place]

## 2015-10-18 NOTE — ED Notes (Signed)
Pt. Returned from nuclear med.

## 2015-10-18 NOTE — ED Notes (Signed)
Pt. Very SOB when stood up and dropped O2 sats.

## 2015-10-18 NOTE — ED Notes (Signed)
Attempted report 

## 2015-10-18 NOTE — ED Notes (Signed)
Admitting at bedside 

## 2015-10-18 NOTE — ED Provider Notes (Signed)
CSN: [id number]     Arrival date & time 10/18/15  0741 History   First MD Initiated Contact with Patient 10/18/15 0750     Chief Complaint  Patient presents with  . Fall     (Consider location/radiation/quality/duration/timing/severity/associated sxs/prior Treatment) HPI   Levi Lewis is a 82 y.o. male who presents for evaluation of injury from fall. He was in his bathroom this morning when he fell against a sink injuring his left chest region. He was able to and right afterwards with minimal assistance. He has been nauseated for several days, and have decreased appetite. He denies vomiting, diarrhea, blood in stool, fever, chills, dysuria, or abdominal pain. He has had a cough, nonproductive for several days. He saw his cardiologist several weeks ago and at that time had, "fluid on my lungs". Following that visit, he took an increased amount of diuretic for several days, then resumed his regular dose. There are no other known modifying factors.   Past Medical History  Diagnosis Date  . Left bundle branch block   . CAD (coronary artery disease)   . Hypertension   . Gout   . Dyslipidemia   . Lung nodules   . COPD (chronic obstructive pulmonary disease) ([place])   . Mitral regurgitation   . Aortic insufficiency   . Shoulder pain   . Back pain     Back and knee pain, June, 2014  . Cardiomyopathy, EF by echo 25-30% 07/14/2013  . RBBB   . Atrial fibrillation ([place])   . Pacemaker   . Shortness of breath   . Pneumonia   . Arthritis    Past Surgical History  Procedure Laterality Date  . Back surgery    . Right shoulder    . Right knee surgery    . Tonsillectomy    . Coronary angioplasty    . Bi-ventricular pacemaker insertion (crt-p)  07-15-2013    STJ Quadra Allura CRTP implanted by Dr Caryl Comes for NICM, CHF, alternating bundle branch blocks  . Cardioversion N/A 09/13/2013    Procedure: CARDIOVERSION;  Surgeon: Carlena Bjornstad, MD;  Location: [facility] ENDOSCOPY;  Service: Cardiovascular;   Laterality: N/A;  . Cardioversion N/A 10/17/2013    Procedure: CARDIOVERSION;  Surgeon: Lelon Perla, MD;  Location: [facility] ENDOSCOPY;  Service: Cardiovascular;  Laterality: N/A;  . Permanent pacemaker insertion N/A 07/15/2013    Procedure: PERMANENT PACEMAKER INSERTION;  Surgeon: Deboraha Sprang, MD;  Location: [facility] CATH LAB;  Service: Cardiovascular;  Laterality: N/A;   Family History  Problem Relation Age of Onset  . Heart attack Sister   . Lung cancer Brother     smoker  . Colon cancer Neg Hx    Social History  Substance Use Topics  . Smoking status: Never Smoker   . Smokeless tobacco: Never Used  . Alcohol Use: No    Review of Systems  All other systems reviewed and are negative.     Allergies  Review of patient's allergies indicates no known allergies.  Home Medications   Prior to Admission medications   Medication Sig Start Date End Date Taking? Authorizing Provider  carvedilol (COREG) 3.125 MG tablet Take 3.125 mg by mouth at bedtime. Take one tablet (3.125 mg) by mouth every evening   Yes Deboraha Sprang, MD  ELIQUIS 2.5 MG TABS tablet TAKE 1 TABLET (2.5 MG TOTAL) BY MOUTH 2 (TWO) TIMES DAILY. 10/05/15  Yes Deboraha Sprang, MD  fenofibrate (TRICOR) 48 MG tablet TAKE 1/2 TABLET BY MOUTH  DAILY 07/01/15  Yes Jerline Pain, MD  fish oil-omega-3 fatty acids 1000 MG capsule Take 2 g by mouth daily.    Yes Historical Provider, MD  furosemide (LASIX) 40 MG tablet Take 20-40 mg by mouth See admin instructions. 1 tablet in the morning and 1/2 tablet in the afternoon.   Yes Historical Provider, MD  gabapentin (NEURONTIN) 300 MG capsule Take 300 mg by mouth 2 (two) times daily. 09/09/15  Yes Historical Provider, MD  guaiFENesin (MUCINEX) 600 MG 12 hr tablet Take 600 mg by mouth 2 (two) times daily as needed.   Yes Historical Provider, MD  levothyroxine (SYNTHROID, LEVOTHROID) 75 MCG tablet Take 1 tablet (75 mcg total) by mouth daily. 04/21/14  Yes Carlena Bjornstad, MD  Multiple  Vitamins-Minerals (OCUVITE PO) Take 2 tablets by mouth daily.    Yes Historical Provider, MD  omeprazole (PRILOSEC) 20 MG capsule Take 20 mg by mouth daily.    Yes Historical Provider, MD  predniSONE (DELTASONE) 10 MG tablet Take 5 mg by mouth daily. 09/04/15  Yes Historical Provider, MD  sodium chloride (OCEAN) 0.65 % SOLN nasal spray Place 1 spray into both nostrils daily as needed for congestion.    Yes Historical Provider, MD  tamsulosin (FLOMAX) 0.4 MG CAPS capsule Take 0.4 mg by mouth 2 (two) times daily.    Yes Historical Provider, MD  triazolam (HALCION) 0.25 MG tablet Take 0.5 tablets (0.125 mg total) by mouth at bedtime as needed for sleep. 10/24/13  Yes Delfina Redwood, MD   BP 125/76 mmHg  Pulse 98  Temp(Src) 97.4 F (36.3 C) (Axillary)  Resp 19  Ht 5\' 11"  (1.803 m)  Wt 181 lb (82.101 kg)  BMI 25.26 kg/m2  SpO2 91% Physical Exam  Constitutional: He is oriented to person, place, and time. He appears well-developed.  Elderly, frail  HENT:  Head: Normocephalic.  Right Ear: External ear normal.  Left Ear: External ear normal.  Small abrasion mid anterior forehead. No associated bleeding or crepitation.  Eyes: Conjunctivae and EOM are normal. Pupils are equal, round, and reactive to light.  Neck: Normal range of motion and phonation normal. Neck supple.  Cardiovascular: Normal rate, regular rhythm and normal heart sounds.   Pulmonary/Chest: Effort normal. He exhibits no bony tenderness.  Rales bilaterally halfway up. Tender left chest wall without crepitation or deformity.  Abdominal: Soft. He exhibits no distension. There is no tenderness. There is no guarding.  Musculoskeletal: Normal range of motion.  Neurological: He is alert and oriented to person, place, and time. No cranial nerve deficit or sensory deficit. He exhibits normal muscle tone. Coordination normal.  Skin: Skin is warm, dry and intact.  Psychiatric: He has a normal mood and affect. His behavior is normal.  Judgment and thought content normal.  Nursing note and vitals reviewed.   ED Course  Procedures (including critical care time)  Initial clinical impression- fall with minor head injury, and left chest wall injury. Fall, likely precipitated by weakness associated with decreased oral intake and nausea. Patient will require evaluation for causative factors, and potential injuries requiring further treatment.  Medications  acetaminophen (TYLENOL) tablet 650 mg (650 mg Oral Given 10/18/15 1031)  traMADol (ULTRAM) tablet 50 mg (50 mg Oral Given 10/18/15 1031)    Patient Vitals for the past 24 hrs:  BP Temp Temp src Pulse Resp SpO2 Height Weight  10/18/15 1000 125/76 mmHg - - 98 19 91 % - -  10/18/15 [id number] - - - - 14  97 % - -  10/18/15 0920 132/69 mmHg - - 70 11 (!) 88 % - -  10/18/15 0815 135/73 mmHg - - 70 21 92 % - -  10/18/15 0749 152/84 mmHg 97.4 F (36.3 C) Axillary 75 16 94 % 5\' 11"  (1.803 m) 181 lb (82.101 kg)    11:31 AM Reevaluation with update and discussion. After initial assessment and treatment, an updated evaluation reveals since arrival, he has become hypoxic. He was treated with oxygen by nasal cannula with normalization, of the oxygen saturation. D-dimer test sent for evaluation of possible coagulation, PE. D-dimer elevated. VQ scan ordered, unable to do angiogram because of renal insufficiency. He will require admission for stabilization and further treatment. Sherby Moncayo L   11:22 AM-Consult complete with hospitalist. Patient case explained and discussed. She agrees to admit patient for further evaluation and treatment. Call ended at 11:30   CRITICAL CARE Performed by: Richarda Blade Total critical care time: 40 minutes Critical care time was exclusive of separately billable procedures and treating other patients. Critical care was necessary to treat or prevent imminent or life-threatening deterioration. Critical care was time spent personally by me on the following  activities: development of treatment plan with patient and/or surrogate as well as nursing, discussions with consultants, evaluation of patient's response to treatment, examination of patient, obtaining history from patient or surrogate, ordering and performing treatments and interventions, ordering and review of laboratory studies, ordering and review of radiographic studies, pulse oximetry and re-evaluation of patient's condition.  Labs Review Labs Reviewed  URINALYSIS, ROUTINE W REFLEX MICROSCOPIC (NOT AT [facility]) - Abnormal; Notable for the following:    Bilirubin Urine SMALL (*)    All other components within normal limits  BASIC METABOLIC PANEL - Abnormal; Notable for the following:    Chloride 112 (*)    CO2 21 (*)    Glucose, Bld 121 (*)    BUN 24 (*)    Creatinine, Ser 1.95 (*)    GFR calc non Af Amer 27 (*)    GFR calc Af Amer 32 (*)    All other components within normal limits  CBC WITH DIFFERENTIAL/PLATELET - Abnormal; Notable for the following:    RBC 3.99 (*)    Hemoglobin 12.5 (*)    HCT 38.3 (*)    All other components within normal limits  BRAIN NATRIURETIC PEPTIDE - Abnormal; Notable for the following:    B Natriuretic Peptide 721.7 (*)    All other components within normal limits  D-DIMER, QUANTITATIVE (NOT AT [facility]) - Abnormal; Notable for the following:    D-Dimer, Quant 1.43 (*)    All other components within normal limits    Imaging Review Dg Ribs Unilateral W/chest Left  10/18/2015  CLINICAL DATA:  Recent fall with left-sided chest pain, initial encounter EXAM: LEFT RIBS AND CHEST - 3+ VIEW COMPARISON:  09/21/2015 FINDINGS: Cardiac shadow is stable. Pacing device is again noted. Diffuse interstitial changes are again seen and stable. No acute rib fracture is noted. Degenerative changes of the thoracic spine are seen. IMPRESSION: No acute rib fracture noted. Electronically Signed   By: Inez Catalina M.D.   On: 10/18/2015 09:06   Ct Head Wo Contrast  10/18/2015   CLINICAL DATA:  Fall in bathroom this morning. Hit left side of head. Pain. Initial encounter. EXAM: CT HEAD WITHOUT CONTRAST CT CERVICAL SPINE WITHOUT CONTRAST TECHNIQUE: Multidetector CT imaging of the head and cervical spine was performed following the standard protocol without intravenous contrast. Multiplanar CT  image reconstructions of the cervical spine were also generated. COMPARISON:  06/15/2014 FINDINGS: CT HEAD FINDINGS There is no evidence of intracranial hemorrhage, brain edema, or other signs of acute infarction. There is no evidence of intracranial mass lesion or mass effect. No abnormal extraaxial fluid collections are identified. Moderate diffuse cerebral atrophy and mild chronic small vessel disease are stable in appearance. Ventricles are stable in size. No evidence of skull fracture. CT CERVICAL SPINE FINDINGS No evidence of acute cervical spine fracture. Multilevel degenerative disc disease is seen which is most severe at C5-6 and C6-7. Mild to moderate facet DJD is also seen bilaterally at most cervical levels. Mild anterolisthesis at C4-5 measuring 3 mm is attributable to degenerative changes seen at this level, and appears stable compared to prior exam. IMPRESSION: No acute intracranial abnormality. Stable cerebral atrophy and chronic small vessel disease. No evidence of acute cervical spine fracture. Advanced degenerative spondylosis, as described above. Electronically Signed   By: Earle Gell M.D.   On: 10/18/2015 09:34   Ct Cervical Spine Wo Contrast  10/18/2015  CLINICAL DATA:  Fall in bathroom this morning. Hit left side of head. Pain. Initial encounter. EXAM: CT HEAD WITHOUT CONTRAST CT CERVICAL SPINE WITHOUT CONTRAST TECHNIQUE: Multidetector CT imaging of the head and cervical spine was performed following the standard protocol without intravenous contrast. Multiplanar CT image reconstructions of the cervical spine were also generated. COMPARISON:  06/15/2014 FINDINGS: CT HEAD  FINDINGS There is no evidence of intracranial hemorrhage, brain edema, or other signs of acute infarction. There is no evidence of intracranial mass lesion or mass effect. No abnormal extraaxial fluid collections are identified. Moderate diffuse cerebral atrophy and mild chronic small vessel disease are stable in appearance. Ventricles are stable in size. No evidence of skull fracture. CT CERVICAL SPINE FINDINGS No evidence of acute cervical spine fracture. Multilevel degenerative disc disease is seen which is most severe at C5-6 and C6-7. Mild to moderate facet DJD is also seen bilaterally at most cervical levels. Mild anterolisthesis at C4-5 measuring 3 mm is attributable to degenerative changes seen at this level, and appears stable compared to prior exam. IMPRESSION: No acute intracranial abnormality. Stable cerebral atrophy and chronic small vessel disease. No evidence of acute cervical spine fracture. Advanced degenerative spondylosis, as described above. Electronically Signed   By: Earle Gell M.D.   On: 10/18/2015 09:34   I have personally reviewed and evaluated these images and lab results as part of my medical decision-making.   EKG Interpretation   Date/Time:  Sunday October 18 2015 09:37:26 EDT Ventricular Rate:  98 PR Interval:    QRS Duration: 169 QT Interval:  465 QTC Calculation: 594 R Axis:   -89 Text Interpretation:  Ventricular-paced rhythm No further analysis  attempted due to paced rhythm Since last tracing of earlier today No  significant change was found Confirmed by Eulis Foster  MD, Hau Sanor [phone]) on  10/18/2015 10:06:31 AM       EKG Interpretation  Date/Time:  Sunday October 18 2015 09:37:26 EDT Ventricular Rate:  98 PR Interval:    QRS Duration: 169 QT Interval:  465 QTC Calculation: 594 R Axis:   -89 Text Interpretation:  Ventricular-paced rhythm No further analysis attempted due to paced rhythm Since last tracing of earlier today No significant change was found Confirmed  by Eulis Foster  MD, Tramya Schoenfelder [id number]) on 10/18/2015 10:06:31 AM         MDM   Final diagnoses:  Acute hypoxemic respiratory failure ([place])  Fall, initial  encounter  Contusion, multiple sites    Weakness and malaise with fall, without serious injury. Concern for PE as source of weakness, and malaise. Hypoxia improves with oxygen per nasal cannula, and may be multifactorial. History of CHF with mildly elevated BNP, but chest x-ray not indicative of acute congestive heart failure. Lung exam does not indicate significant bronchospasm as source for hypoxia.  Nursing Notes Reviewed/ Care Coordinated, and agree without changes. Applicable Imaging Reviewed.  Interpretation of Laboratory Data incorporated into ED treatment  Plan: Admit    Daleen Bo, MD 10/18/15 1144

## 2015-10-18 NOTE — ED Notes (Signed)
Pt. Coming from home c/o side pain and head pain after fall. Pt. Does take eliquis and endorses hitting his head. Pt. Aox4.

## 2015-10-18 NOTE — ED Notes (Signed)
Pt. Transported to nuclear med at this time.  

## 2015-10-18 NOTE — ED Notes (Signed)
Pt. Able to drink water without difficulty.

## 2015-10-18 NOTE — H&P (Signed)
History and Physical    Levi Lewis [id number] DOB: March 19, 1918 DOA: 10/18/2015   PCP: Criselda Peaches, MD   Patient coming from:  Home   Chief Complaint: Fall   HPI: Levi Lewis is a 82 y.o. male with medical history significant for  PMP 06/2013 for Afib/ BBB, on Eliquis,CAD, hypertension, gout, dyslipidemia, known lung nodules, COPD, presenting to the emergency department today, for evaluation of injury from fall to his left chest  Region. He was able to get up right away, with minimal assistance. However, he did hurt the left rib-area, as well as the left frontoparietal region. He denies any dizziness or vertigo with the symptoms. No LOC or confusion. No unilateral weakness. No seizure. He has been nauseated for several days, and had decreased appetite. He denies any vomiting, diarrhea, blood in the stools, food poisoning.He denies any abdominal pain. He denies any dysuria or gross hematuria. Denies worsening incontinence. The patient has had a cough for several days, and has had sick contact, as his wife was "fighting a cold ".he denies any pleuritic chest pain. He also has been more short of breath, due to "fluid in the lungs  " and has been taking increased amounts of diuretic for several days, just resuming his regular dose as of yesterday. He denies any leg swelling. He denies any cardiac complaints, no palpitations. Patient denies any prior history of PE or DVT. No recent long distance trips, or change in medications.    ED Course:  BP 131/71 mmHg  Pulse 72  Temp(Src) 97.4 F (36.3 C) (Axillary)  Resp 25  Ht 5\' 11"  (1.803 m)  Wt 82.101 kg (181 lb)  BMI 25.26 kg/m2  SpO2 94%   after initial assessment, the patient was hypoxic, at 88, but after treating with oxygen  Nasal cannula, this was normalized. The patient is now not using oxygen [place] D-dimer is elevated at 1.43. BNP 721. EKG shows V [place] and rhythm, no acute coronary syndrome.QTC 594 Chest x-ray not indicative of acute  CHF. No fractures noted.  CT of the head and C-spine are negative for acute intracranial abnormalities or fractures. Urinalysis negative. Review of Systems: As per HPI otherwise 10 point review of systems negative.   Past Medical History  Diagnosis Date  . Left bundle branch block   . CAD (coronary artery disease)   . Hypertension   . Gout   . Dyslipidemia   . Lung nodules   . COPD (chronic obstructive pulmonary disease) ([place])   . Mitral regurgitation   . Aortic insufficiency   . Shoulder pain   . Back pain     Back and knee pain, June, 2014  . Cardiomyopathy, EF by echo 25-30% 07/14/2013  . RBBB   . Atrial fibrillation ([place])   . Pacemaker   . Shortness of breath   . Pneumonia   . Arthritis     Past Surgical History  Procedure Laterality Date  . Back surgery    . Right shoulder    . Right knee surgery    . Tonsillectomy    . Coronary angioplasty    . Bi-ventricular pacemaker insertion (crt-p)  07-15-2013    STJ Quadra Allura CRTP implanted by Dr Caryl Comes for NICM, CHF, alternating bundle branch blocks  . Cardioversion N/A 09/13/2013    Procedure: CARDIOVERSION;  Surgeon: Carlena Bjornstad, MD;  Location: [facility] ENDOSCOPY;  Service: Cardiovascular;  Laterality: N/A;  . Cardioversion N/A 10/17/2013    Procedure: CARDIOVERSION;  Surgeon:  Lelon Perla, MD;  Location: [facility] ENDOSCOPY;  Service: Cardiovascular;  Laterality: N/A;  . Permanent pacemaker insertion N/A 07/15/2013    Procedure: PERMANENT PACEMAKER INSERTION;  Surgeon: Deboraha Sprang, MD;  Location: [facility] CATH LAB;  Service: Cardiovascular;  Laterality: N/A;    Social History Social History   Social History  . Marital Status: Married    Spouse Name: N/A  . Number of Children: N/A  . Years of Education: N/A   Occupational History  . Not on file.   Social History Main Topics  . Smoking status: Never Smoker   . Smokeless tobacco: Never Used  . Alcohol Use: No  . Drug Use: No  . Sexual Activity: Not Currently   Other  Topics Concern  . Not on file   Social History Narrative   He has never smoked or drank. No illcit drug use . He is retired from the [employer] and lives with his wife. Illicit Drug Use- no     No Known Allergies  Family History  Problem Relation Age of Onset  . Heart attack Sister   . Lung cancer Brother     smoker  . Colon cancer Neg Hx       Prior to Admission medications   Medication Sig Start Date End Date Taking? Authorizing Provider  carvedilol (COREG) 3.125 MG tablet Take 3.125 mg by mouth at bedtime. Take one tablet (3.125 mg) by mouth every evening   Yes Deboraha Sprang, MD  ELIQUIS 2.5 MG TABS tablet TAKE 1 TABLET (2.5 MG TOTAL) BY MOUTH 2 (TWO) TIMES DAILY. 10/05/15  Yes Deboraha Sprang, MD  fenofibrate (TRICOR) 48 MG tablet TAKE 1/2 TABLET BY MOUTH DAILY 07/01/15  Yes Jerline Pain, MD  fish oil-omega-3 fatty acids 1000 MG capsule Take 2 g by mouth daily.    Yes Historical Provider, MD  furosemide (LASIX) 40 MG tablet Take 20-40 mg by mouth See admin instructions. 1 tablet in the morning and 1/2 tablet in the afternoon.   Yes Historical Provider, MD  gabapentin (NEURONTIN) 300 MG capsule Take 300 mg by mouth 2 (two) times daily. 09/09/15  Yes Historical Provider, MD  guaiFENesin (MUCINEX) 600 MG 12 hr tablet Take 600 mg by mouth 2 (two) times daily as needed.   Yes Historical Provider, MD  levothyroxine (SYNTHROID, LEVOTHROID) 75 MCG tablet Take 1 tablet (75 mcg total) by mouth daily. 04/21/14  Yes Carlena Bjornstad, MD  Multiple Vitamins-Minerals (OCUVITE PO) Take 2 tablets by mouth daily.    Yes Historical Provider, MD  omeprazole (PRILOSEC) 20 MG capsule Take 20 mg by mouth daily.    Yes Historical Provider, MD  predniSONE (DELTASONE) 10 MG tablet Take 5 mg by mouth daily. 09/04/15  Yes Historical Provider, MD  sodium chloride (OCEAN) 0.65 % SOLN nasal spray Place 1 spray into both nostrils daily as needed for congestion.    Yes Historical Provider, MD  tamsulosin (FLOMAX)  0.4 MG CAPS capsule Take 0.4 mg by mouth 2 (two) times daily.    Yes Historical Provider, MD  triazolam (HALCION) 0.25 MG tablet Take 0.5 tablets (0.125 mg total) by mouth at bedtime as needed for sleep. 10/24/13  Yes Delfina Redwood, MD    Physical Exam:    Filed Vitals:   10/18/15 0921 10/18/15 1000 10/18/15 1115 10/18/15 1230  BP:  125/76 132/83 131/71  Pulse:  98 98 72  Temp:      TempSrc:      Resp:  14 19 29 25   Height:      Weight:      SpO2: 97% 91% 92% 94%       Constitutional: NAD, calm, comfortable  Filed Vitals:   10/18/15 0921 10/18/15 1000 10/18/15 1115 10/18/15 1230  BP:  125/76 132/83 131/71  Pulse:  98 98 72  Temp:      TempSrc:      Resp: 14 19 29 25   Height:      Weight:      SpO2: 97% 91% 92% 94%   Eyes: PERRL, lids and conjunctivae normal.  ENMT: Mucous membranes are moist. Posterior pharynx clear of any exudate or lesions.Normal dentition.  Neck: normal, supple, no masses, no thyromegaly Respiratory: cRales are noted bilaterally at the bases, it is tenderness on the left chest wall without deformity. Normal respiratory effort. No accessory muscle use.  Cardiovascular: Regular rate and rhythm, no murmurs / rubs / gallops. Paced .No extremity edema. 2+ pedal pulses. No carotid bruits.  Abdomen: no tenderness, no masses palpated. No hepatosplenomegaly. Bowel sounds positive.  Musculoskeletal: no clubbing / cyanosis. No joint deformity upper and lower extremities. Good ROM, no contractures. Normal muscle tone.  Skin: no rashes,multiple keratotic areas in his face..small aberration in the mid anterior forehead, without associated bleeding or crepitation.  Neurologic: CN 2-12 grossly intact. Sensation intact, DTR normal. Strength 5/5 in all 4.  Psychiatric: Normal judgment and insight. Alert and oriented x 3. Normal mood.     Labs on Admission: I have personally reviewed following labs and imaging studies  CBC:  Recent Labs Lab 10/18/15 0822  WBC  10.2  NEUTROABS 7.2  HGB 12.5*  HCT 38.3*  MCV 96.0  PLT [id number]    Basic Metabolic Panel:  Recent Labs Lab 10/18/15 0822  NA 141  K 4.1  CL 112*  CO2 21*  GLUCOSE 121*  BUN 24*  CREATININE 1.95*  CALCIUM 9.0    GFR: Estimated Creatinine Clearance: 23.6 mL/min (by C-G formula based on Cr of 1.95).  Urine analysis:    Component Value Date/Time   COLORURINE YELLOW 10/18/2015 [place] 10/18/2015 1033   LABSPEC 1.012 10/18/2015 1033   PHURINE 7.0 10/18/2015 1033   GLUCOSEU NEGATIVE 10/18/2015 1033   HGBUR NEGATIVE 10/18/2015 1033   BILIRUBINUR SMALL* 10/18/2015 1033   KETONESUR NEGATIVE 10/18/2015 1033   PROTEINUR NEGATIVE 10/18/2015 1033   UROBILINOGEN 1.0 02/19/2015 1815   NITRITE NEGATIVE 10/18/2015 1033   LEUKOCYTESUR NEGATIVE 10/18/2015 1033    Sepsis Labs: @LABRCNTIP (procalcitonin:4,lacticidven:4) )No results found for this or any previous visit (from the past 240 hour(s)).   Radiological Exams on Admission: Dg Ribs Unilateral W/chest Left  10/18/2015  CLINICAL DATA:  Recent fall with left-sided chest pain, initial encounter EXAM: LEFT RIBS AND CHEST - 3+ VIEW COMPARISON:  09/21/2015 FINDINGS: Cardiac shadow is stable. Pacing device is again noted. Diffuse interstitial changes are again seen and stable. No acute rib fracture is noted. Degenerative changes of the thoracic spine are seen. IMPRESSION: No acute rib fracture noted. Electronically Signed   By: Inez Catalina M.D.   On: 10/18/2015 09:06   Ct Head Wo Contrast  10/18/2015  CLINICAL DATA:  Fall in bathroom this morning. Hit left side of head. Pain. Initial encounter. EXAM: CT HEAD WITHOUT CONTRAST CT CERVICAL SPINE WITHOUT CONTRAST TECHNIQUE: Multidetector CT imaging of the head and cervical spine was performed following the standard protocol without intravenous contrast. Multiplanar CT image reconstructions of the cervical spine were also  generated. COMPARISON:  06/15/2014 FINDINGS: CT HEAD  FINDINGS There is no evidence of intracranial hemorrhage, brain edema, or other signs of acute infarction. There is no evidence of intracranial mass lesion or mass effect. No abnormal extraaxial fluid collections are identified. Moderate diffuse cerebral atrophy and mild chronic small vessel disease are stable in appearance. Ventricles are stable in size. No evidence of skull fracture. CT CERVICAL SPINE FINDINGS No evidence of acute cervical spine fracture. Multilevel degenerative disc disease is seen which is most severe at C5-6 and C6-7. Mild to moderate facet DJD is also seen bilaterally at most cervical levels. Mild anterolisthesis at C4-5 measuring 3 mm is attributable to degenerative changes seen at this level, and appears stable compared to prior exam. IMPRESSION: No acute intracranial abnormality. Stable cerebral atrophy and chronic small vessel disease. No evidence of acute cervical spine fracture. Advanced degenerative spondylosis, as described above. Electronically Signed   By: Earle Gell M.D.   On: 10/18/2015 09:34   Ct Cervical Spine Wo Contrast  10/18/2015  CLINICAL DATA:  Fall in bathroom this morning. Hit left side of head. Pain. Initial encounter. EXAM: CT HEAD WITHOUT CONTRAST CT CERVICAL SPINE WITHOUT CONTRAST TECHNIQUE: Multidetector CT imaging of the head and cervical spine was performed following the standard protocol without intravenous contrast. Multiplanar CT image reconstructions of the cervical spine were also generated. COMPARISON:  06/15/2014 FINDINGS: CT HEAD FINDINGS There is no evidence of intracranial hemorrhage, brain edema, or other signs of acute infarction. There is no evidence of intracranial mass lesion or mass effect. No abnormal extraaxial fluid collections are identified. Moderate diffuse cerebral atrophy and mild chronic small vessel disease are stable in appearance. Ventricles are stable in size. No evidence of skull fracture. CT CERVICAL SPINE FINDINGS No evidence of  acute cervical spine fracture. Multilevel degenerative disc disease is seen which is most severe at C5-6 and C6-7. Mild to moderate facet DJD is also seen bilaterally at most cervical levels. Mild anterolisthesis at C4-5 measuring 3 mm is attributable to degenerative changes seen at this level, and appears stable compared to prior exam. IMPRESSION: No acute intracranial abnormality. Stable cerebral atrophy and chronic small vessel disease. No evidence of acute cervical spine fracture. Advanced degenerative spondylosis, as described above. Electronically Signed   By: Earle Gell M.D.   On: 10/18/2015 09:34    EKG: Independently reviewed.   Assessment/Plan Active Problems:   Left bundle branch block   CAD (coronary artery disease)   Dyslipidemia   COPD (chronic obstructive pulmonary disease) (HCC)   CKD (chronic kidney disease) stage 4, GFR 15-29 ml/min (HCC)   Cardiomyopathy, EF by echo 25-30%   Pacemaker   Chronic systolic CHF (congestive heart failure) (HCC)   HX: anticoagulation   Acute hypoxemic respiratory failure (HCC)   Fall    Generalized weakness with fall and with acute respiratory failure  Currently without hypoxia. H/o COPD and  Chronic systolic CHF/ CAD/ PMP  He has been weak. Has been increasingly short of breath. Chest x-ray not indicative of acute CHF. No fractures noted. D-dimer is elevated at 1.43. BNP 721. EKG shows V paceand rhythm, no acute coronary syndrome. QTC 594. CT of the head and C-spine are negative for acute intracranial abnormalities or fractures. VQ scan is pending  - Telemetry, observation - Meclizine when necessary dizziness. Careful use of antiemetics due to  Increased QTC  - Half-normal saline 50 mL per hour due to low EF OF 25-30  And severely reduced LVF  2D echo    EKG in am  PT/OT  Continue Mucinex    Chronic kidney disease stage  IV  baseline creatinine 2, at [facility]    Lab Results  Component Value Date   CREATININE 1.95* 10/18/2015   CREATININE  1.68* 06/22/2015   CREATININE 2.11* 02/19/2015  Careful use of diuretics  Repeat CMET in am  Hypothyroidism: -Continue home Synthroid  GERD, no acute symptoms: Continue Prilosec   Hypertension BP 132/83 mmHg  Pulse 98  Controlled Continue home anti-hypertensive medications but hold for BP < 100/55, P55    DVT prophylaxis:  Eliquis  Code Status:   Full    Family Communication:  none Disposition Plan: Expect patient to be discharged to home after condition improves Consults called:    None Admission status:Tele  Obs     Kimbra Marcelino E, PA-C [facility]   If 7PM-7AM, please contact night-coverage [email or web address] Password [facility]  10/18/2015, 12:48 PM

## 2015-10-18 NOTE — ED Notes (Signed)
PT unable to provide UA at this time... 

## 2015-10-19 DIAGNOSIS — I429 Cardiomyopathy, unspecified: Secondary | ICD-10-CM

## 2015-10-19 DIAGNOSIS — I2510 Atherosclerotic heart disease of native coronary artery without angina pectoris: Secondary | ICD-10-CM | POA: Diagnosis not present

## 2015-10-19 DIAGNOSIS — W19XXXA Unspecified fall, initial encounter: Secondary | ICD-10-CM | POA: Diagnosis not present

## 2015-10-19 DIAGNOSIS — I480 Paroxysmal atrial fibrillation: Secondary | ICD-10-CM

## 2015-10-19 DIAGNOSIS — Z7901 Long term (current) use of anticoagulants: Secondary | ICD-10-CM

## 2015-10-19 DIAGNOSIS — Z950 Presence of cardiac pacemaker: Secondary | ICD-10-CM | POA: Diagnosis not present

## 2015-10-19 DIAGNOSIS — J9601 Acute respiratory failure with hypoxia: Secondary | ICD-10-CM | POA: Diagnosis not present

## 2015-10-19 LAB — COMPREHENSIVE METABOLIC PANEL
ALK PHOS: 40 U/L (ref 38–126)
ALT: 11 U/L — ABNORMAL LOW (ref 17–63)
ANION GAP: 18 — AB (ref 5–15)
AST: 15 U/L (ref 15–41)
Albumin: 2.8 g/dL — ABNORMAL LOW (ref 3.5–5.0)
BILIRUBIN TOTAL: 1 mg/dL (ref 0.3–1.2)
BUN: 23 mg/dL — AB (ref 6–20)
CALCIUM: 9.4 mg/dL (ref 8.9–10.3)
CO2: 23 mmol/L (ref 22–32)
Chloride: 101 mmol/L (ref 101–111)
Creatinine, Ser: 1.65 mg/dL — ABNORMAL HIGH (ref 0.61–1.24)
GFR calc Af Amer: 39 mL/min — ABNORMAL LOW (ref >60)
GFR, EST NON AFRICAN AMERICAN: 33 mL/min — AB (ref >60)
Glucose, Bld: 108 mg/dL — ABNORMAL HIGH (ref 65–99)
POTASSIUM: 4 mmol/L (ref 3.5–5.1)
Sodium: 142 mmol/L (ref 135–145)
TOTAL PROTEIN: 5.5 g/dL — AB (ref 6.5–8.1)

## 2015-10-19 LAB — CBC
HEMATOCRIT: 36.9 % — AB (ref 39.0–52.0)
Hemoglobin: 11.8 g/dL — ABNORMAL LOW (ref 13.0–17.0)
MCH: 30.9 pg (ref 26.0–34.0)
MCHC: 32 g/dL (ref 30.0–36.0)
MCV: 96.6 fL (ref 78.0–100.0)
Platelets: 224 10*3/uL (ref 150–400)
RBC: 3.82 MIL/uL — ABNORMAL LOW (ref 4.22–5.81)
RDW: 14.1 % (ref 11.5–15.5)
WBC: 7.7 10*3/uL (ref 4.0–10.5)

## 2015-10-19 MED ORDER — PROCHLORPERAZINE EDISYLATE 5 MG/ML IJ SOLN
5.0000 mg | Freq: Four times a day (QID) | INTRAMUSCULAR | Status: DC | PRN
  Administered 2015-10-19: 5 mg via INTRAVENOUS
  Filled 2015-10-19 (×2): qty 1

## 2015-10-19 MED ORDER — CETYLPYRIDINIUM CHLORIDE 0.05 % MT LIQD
7.0000 mL | Freq: Two times a day (BID) | OROMUCOSAL | Status: DC
  Administered 2015-10-19 – 2015-10-21 (×5): 7 mL via OROMUCOSAL

## 2015-10-19 NOTE — Progress Notes (Signed)
Occupational Therapy Evaluation Patient Details Name: Levi Lewis MRN: [id number] DOB: 1917/12/28 Today's Date: 10/19/2015    History of Present Illness 82 y.o. male with medical history significant for PMP 06/2013 for Afib/ BBB, on Eliquis,CAD, hypertension, gout, dyslipidemia, known lung nodules, COPD, presenting to the emergency department today, for evaluation of injury from fall to his left chest   Clinical Impression   PTA, pt was independent with ADLs and used [facility] for mobility. Pt currently requires min assist for LB ADLs and basic transfers due to acute pain and fatigue. Pt SpO2 dropped from 97% on 2L [place] to 86% on RA during activity. Pt plans to d/c home with 24/7 supervision from his wife and intermittent assistance from his son. Pt will benefit from continued acute OT to increase independence and safety with ADLs and mobility to allow for safe discharge home. Recommend HHOT and 3in1.    Follow Up Recommendations  Home health OT;Supervision/Assistance - 24 hour    Equipment Recommendations  3 in 1 bedside comode    Recommendations for Other Services       Precautions / Restrictions Precautions Precautions: Fall Restrictions Weight Bearing Restrictions: No      Mobility Bed Mobility Overal bed mobility: Needs Assistance Bed Mobility: Rolling;Sidelying to Sit;Sit to Sidelying Rolling: Min guard Sidelying to sit: Min guard     Sit to sidelying: Min guard General bed mobility comments: min guard for safety. No physical assist required. increased time and effort to perform  Transfers Overall transfer level: Needs assistance Equipment used: Rolling walker (2 wheeled);1 person hand held assist Transfers: Sit to/from Stand Sit to Stand: Min guard         General transfer comment: min guard for stability no physical assist    Balance Overall balance assessment: History of Falls                                          ADL Overall ADL's :  Needs assistance/impaired     Grooming: Wash/dry hands;Min guard;Standing   Upper Body Bathing: Set up;Sitting   Lower Body Bathing: Minimal assistance;Sit to/from stand   Upper Body Dressing : Set up;Sitting   Lower Body Dressing: Minimal assistance;Sit to/from stand   Toilet Transfer: Min guard;Cueing for [employer];Ambulation;RW   Toileting- Water quality scientist and Hygiene: Min guard;Sit to/from stand       Functional mobility during ADLs: Min guard;Rolling walker General ADL Comments: Pt fatigues quickly.     Vision Vision Assessment?: No apparent visual deficits   Perception     Praxis      Pertinent Vitals/Pain Pain Assessment: Faces Faces Pain Scale: Hurts even more Pain Location: R abdomen Pain Descriptors / Indicators: Discomfort;Guarding Pain Intervention(s): Limited activity within patient's tolerance;Repositioned;Monitored during session     Hand Dominance Right   Extremity/Trunk Assessment Upper Extremity Assessment Upper Extremity Assessment: LUE deficits/detail LUE Deficits / Details: limited ROM from previous hx of injury   Lower Extremity Assessment Lower Extremity Assessment: Generalized weakness   Cervical / Trunk Assessment Cervical / Trunk Assessment: Kyphotic   Communication Communication Communication: HOH   Cognition Arousal/Alertness: Awake/alert Behavior During Therapy: Flat affect Overall Cognitive Status: Within Functional Limits for tasks assessed                     General Comments       Exercises  Shoulder Instructions      Home Living Family/patient expects to be discharged to:: Private residence Living Arrangements: Spouse/significant other Available Help at Discharge: Family;Available PRN/intermittently Type of Home: House Home Access: Stairs to enter [employer] of Steps: 3 Entrance Stairs-Rails: Can reach both Home Layout: One level     Bathroom Shower/Tub:  Tub/shower unit Shower/tub characteristics: Curtain Biochemist, clinical: Standard     Home Equipment: Environmental consultant - 2 wheels;Cane - single point          Prior Functioning/Environment Level of Independence: Independent with assistive device(s)        Comments: use of a cane PRN    OT Diagnosis: Generalized weakness;Acute pain   OT Problem List: Decreased strength;Decreased range of motion;Decreased activity tolerance;Impaired balance (sitting and/or standing);Decreased safety awareness;Decreased knowledge of use of DME or AE;Cardiopulmonary status limiting activity;Pain   OT Treatment/Interventions: Therapeutic exercise;Self-care/ADL training;DME and/or AE instruction;Energy conservation;Therapeutic activities;Patient/family education;Balance training    OT Goals(Current goals can be found in the care plan section) Acute Rehab OT Goals Patient Stated Goal: to get back home OT Goal Formulation: With patient Time For Goal Achievement: 11/02/15 Potential to Achieve Goals: Good ADL Goals Pt Will Perform Grooming: with modified independence;standing Pt Will Perform Upper Body Bathing: with modified independence;sitting Pt Will Perform Lower Body Bathing: with modified independence;sit to/from stand Pt Will Transfer to Toilet: with modified independence;ambulating;bedside commode (over toilet) Pt Will Perform Toileting - Clothing Manipulation and hygiene: with modified independence;sit to/from stand  OT Frequency: Min 2X/week   Barriers to D/C: Decreased caregiver support  Wife can only provide minimal assist, son available PRN       Co-evaluation PT/OT/SLP Co-Evaluation/Treatment: Yes Reason for Co-Treatment: For patient/therapist safety PT goals addressed during session: Mobility/safety with mobility OT goals addressed during session: ADL's and self-care;Proper use of Adaptive equipment and DME      End of Session Equipment Utilized During Treatment: Gait belt;Rolling  walker Nurse Communication: Mobility status  Activity Tolerance: Patient limited by fatigue Patient left: in bed;with call bell/phone within reach;with family/visitor present   Time: 1120-1144 OT Time Calculation (min): 24 min Charges:  OT General Charges $OT Visit: 1 Procedure OT Evaluation $OT Eval Moderate Complexity: 1 Procedure G-Codes: OT G-codes **NOT FOR INPATIENT CLASS** Functional Assessment Tool Used: clinical judgement Functional Limitation: Self care Self Care Current Status [id number]): At least 20 percent but less than 40 percent impaired, limited or restricted Self Care Goal Status [id number]): At least 1 percent but less than 20 percent impaired, limited or restricted  Redmond Baseman, OTR/L Pager: [phone] 10/19/2015, 12:56 PM

## 2015-10-19 NOTE — Discharge Instructions (Signed)

## 2015-10-19 NOTE — Care Management Note (Signed)
Case Management Note  Patient Details  Name: LITTLE WINTON MRN: [id number] Date of Birth: 1917/05/08  Subjective/Objective:            CM following for progression and d/c planning.         Action/Plan: 10/19/2015 Met with son and pt , per son this pt has used [facility] previously and they would wish to use that agency again for [facility] needs. [place] notified . Per pt he has a walker no other DME needs identified.   Expected Discharge Date:                  Expected Discharge Plan:  [place]  In-House Referral:  NA  Discharge planning Services  CM Consult  Post Acute Care Choice:  NA Choice offered to:  Adult Children  DME Arranged:   NA DME Agency:   NA  HH Arranged:  RN, PT HH Agency:  [facility]  Status of Service:  Completed, signed off  If discussed at [place] of Stay Meetings, dates discussed:    Additional Comments:  Adron Bene, RN 10/19/2015, 3:16 PM

## 2015-10-19 NOTE — Progress Notes (Addendum)
PROGRESS NOTE  Levi Lewis [id number] DOB: 1917/09/25 DOA: 10/18/2015 PCP: Criselda Peaches, MD  Brief summary: 82 year old male admitted due to generalized weakness with fall, n/v, found to have   acute hypoxemic respiratory failure, and chf exacerbation, cardiology consulted   HPI/Recap of past 24 hours:  Remain feeling nauseuous, vomited x1, received compazine, now drowsy, but oriented x3,he denies pain , reported mild cough recently, he is currently on 2liters, RN reported patient 's oxygen sats dropped to mid 28's on room air   son at bedside,   Assessment/Plan: Active Problems:   Left bundle branch block   CAD (coronary artery disease)   Dyslipidemia   COPD (chronic obstructive pulmonary disease) ([place])   CKD (chronic kidney disease) stage 4, GFR 15-29 ml/min (HCC)   Cardiomyopathy, EF by echo 25-30%   Pacemaker   Chronic systolic CHF (congestive heart failure) (HCC)   HX: anticoagulation   Acute hypoxemic respiratory failure ([place])   Fall   Acute hypoxic respiratory failure: likely from chf exacerbation, no fever, no leukocytosis, VQ scan low probability of PE and he is already on anticoagulation, cxr consistent with fluids overload, oxygen supplement , treat chf.  Acute on chronic systolic chf: last ED [id number] in 2015. S/p CRT-P in 06/2013. On coreg/ IVlasix, cardiology consulted  PAF with bundle branch block: s/p CRT-p, on coreg/apixaban.   Hypothyroidism: continue synthroid  Chronic arthritis: on daily prednisone.  FTT, falls need PT/OT, will need home health if able to discharge home    Code Status: full  Family Communication: patient and son in room  Disposition Plan: likely home with home health in 1-2 days   Consultants:  cardiology  Procedures:  none  Antibiotics:  none   Objective: BP 135/68 mmHg  Pulse 74  Temp(Src) 97.8 F (36.6 C) (Oral)  Resp 20  Ht 5\' 11"  (1.803 m)  Wt 80.922 kg (178 lb 6.4 oz)  BMI 24.89 kg/m2  SpO2  91%  Intake/Output Summary (Last 24 hours) at 10/19/15 0944 Last data filed at 10/19/15 0854  Gross per 24 hour  Intake    540 ml  Output   1025 ml  Net   -485 ml   Filed Weights   10/18/15 0749 10/18/15 1547 10/19/15 0553  Weight: 82.101 kg (181 lb) 81.33 kg (179 lb 4.8 oz) 80.922 kg (178 lb 6.4 oz)    Exam:   General:  Frail, drowsy, on 2liter oxygen  Cardiovascular: pace rhythm  Respiratory: bibasilar crackles, no wheezing, no rhonchi  Abdomen: Soft/ND/NT, positive BS  Musculoskeletal: No Edema  Neuro: aaox3  Data Reviewed: Basic Metabolic Panel:  Recent Labs Lab 10/18/15 0822 10/19/15 0319  NA 141 142  K 4.1 4.0  CL 112* 101  CO2 21* 23  GLUCOSE 121* 108*  BUN 24* 23*  CREATININE 1.95* 1.65*  CALCIUM 9.0 9.4   Liver Function Tests:  Recent Labs Lab 10/19/15 0319  AST 15  ALT 11*  ALKPHOS 40  BILITOT 1.0  PROT 5.5*  ALBUMIN 2.8*   No results for input(s): LIPASE, AMYLASE in the last 168 hours. No results for input(s): AMMONIA in the last 168 hours. CBC:  Recent Labs Lab 10/18/15 0822 10/19/15 0319  WBC 10.2 7.7  NEUTROABS 7.2  --   HGB 12.5* 11.8*  HCT 38.3* 36.9*  MCV 96.0 96.6  PLT 247 224   Cardiac Enzymes:   No results for input(s): CKTOTAL, CKMB, CKMBINDEX, TROPONINI in the last 168 hours. BNP (last 3 results)  Recent Labs  10/18/15 0822  BNP 721.7*    ProBNP (last 3 results) No results for input(s): PROBNP in the last 8760 hours.  CBG: No results for input(s): GLUCAP in the last 168 hours.  No results found for this or any previous visit (from the past 240 hour(s)).   Studies: Dg Chest 2 View  10/18/2015  CLINICAL DATA:  Respiratory distress. EXAM: CHEST  2 VIEW COMPARISON:  10/18/2015, earlier the same day. FINDINGS: Since the previous study there has been increase in vascular congestion with worsening interstitial opacity suggesting evolving pulmonary edema. The cardio pericardial silhouette is enlarged. Left-sided  permanent pacemaker remains in place. Degenerative changes noted in the shoulders. IMPRESSION: Worsening interstitial and basilar alveolar opacities suggest progressing pulmonary edema. Electronically Signed   By: Misty Stanley M.D.   On: 10/18/2015 15:24   Nm Pulmonary Perf And Vent  10/18/2015  CLINICAL DATA:  Shortness of breath. Fall earlier today. Nonproductive cough. EXAM: NUCLEAR MEDICINE VENTILATION - PERFUSION LUNG SCAN TECHNIQUE: Ventilation images were obtained in multiple projections using inhaled aerosol Tc-72m DTPA. Perfusion images were obtained in multiple projections after intravenous injection of Tc-44m MAA. RADIOPHARMACEUTICALS:  32.7 mCi Technetium-34m DTPA aerosol inhalation and 4.06 mCi Technetium-46m MAA IV COMPARISON:  Chest x-ray today. FINDINGS: Ventilation: Linear nonsegmental defect over the right mid to lower lung. Heterogeneous uptake over the right hilar region. New nonsegmental defect over the posterior left lower lobe. Perfusion: Linear nonsegmental defect over the right mid to lower lung. Heterogeneous uptake over the right hilar region. Linear nonsegmental defect over the posterior left lower lobe. No significant peripheral wedge-shaped mismatched defects. IMPRESSION: Findings compatible a low probability for pulmonary embolism. Electronically Signed   By: Marin Olp M.D.   On: 10/18/2015 14:54    Scheduled Meds: . apixaban  2.5 mg Oral BID  . carvedilol  3.125 mg Oral QHS  . furosemide  40 mg Oral Daily   And  . furosemide  20 mg Oral q1800  . gabapentin  300 mg Oral BID  . levothyroxine  75 mcg Oral QAC breakfast  . pantoprazole  40 mg Oral Daily  . sodium chloride flush  3 mL Intravenous Q12H  . tamsulosin  0.4 mg Oral BID    Continuous Infusions:    Time spent: 26mins  Dafney Farler MD, PhD  [facility] Pager [phone]. If 7PM-7AM, please contact night-coverage at [email or web address], password [facility] 10/19/2015, 9:44 AM

## 2015-10-19 NOTE — Progress Notes (Signed)
Pharmacist Heart Failure Core Measure Documentation  Assessment: Levi Lewis has an EF documented as 25-30% on 07/04/13 by ECHO.  Rationale: Heart failure patients with left ventricular systolic dysfunction (LVSD) and an EF < 40% should be prescribed an angiotensin converting enzyme inhibitor (ACEI) or angiotensin receptor blocker (ARB) at discharge unless a contraindication is documented in the medical record.  This patient is not currently on an ACEI or ARB for HF.  This note is being placed in the record in order to provide documentation that a contraindication to the use of these agents is present for this encounter.  ACE Inhibitor or Angiotensin Receptor Blocker is contraindicated (specify all that apply)  []   ACEI allergy AND ARB allergy []   Angioedema []   Moderate or severe aortic stenosis []   Hyperkalemia []   Hypotension []   Renal artery stenosis [x]   Worsening renal function, preexisting renal disease or dysfunction    Jailyn Langhorst D. Mina Marble, PharmD, BCPS Pager:  [phone] 10/19/2015, 12:28 PM

## 2015-10-19 NOTE — Progress Notes (Signed)
OT Cancellation Note  Patient Details Name: Levi Lewis MRN: [id number] DOB: [birth year]   Cancelled Treatment:    Reason Eval/Treat Not Completed: Other (comment). Pt throwing up at bedside and requested therapist come back later when feeling better. RN notified. Will attempt to see later today if time allows.  Redmond Baseman, OTR/L Pager: [phone] 10/19/2015, 9:12 AM

## 2015-10-19 NOTE — Consult Note (Signed)
Cardiology Consult    Patient ID: Levi Lewis MRN: [id number], DOB/AGE: Mar 03, 1918   Admit date: 10/18/2015 Date of Consult: 10/19/2015  Primary Physician: Criselda Peaches, MD Primary Cardiologist: Dr. Armandina Gemma Requesting Provider: Dr. Erlinda Hong Reason for Consultation: CHF  Patient Profile    82 yo male with PMH of HTN/HLD/COPD/A-Fib/SSS s/p pacemaker/ Chronic systolic CHF/MR and AI who presented to the [facility] ED after a fall with nausea and dyspnea.   Past Medical History   Past Medical History  Diagnosis Date  . Left bundle branch block   . CAD (coronary artery disease)   . Hypertension   . Gout   . Dyslipidemia   . Lung nodules   . COPD (chronic obstructive pulmonary disease) ([place])   . Mitral regurgitation   . Aortic insufficiency   . Shoulder pain   . Back pain     Back and knee pain, June, 2014  . Cardiomyopathy, EF by echo 25-30% 07/14/2013  . RBBB   . Atrial fibrillation ([place])   . Pacemaker   . Shortness of breath   . Pneumonia   . Arthritis     Past Surgical History  Procedure Laterality Date  . Back surgery    . Right shoulder    . Right knee surgery    . Tonsillectomy    . Coronary angioplasty    . Bi-ventricular pacemaker insertion (crt-p)  07-15-2013    STJ Quadra Allura CRTP implanted by Dr Caryl Comes for NICM, CHF, alternating bundle branch blocks  . Cardioversion N/A 09/13/2013    Procedure: CARDIOVERSION;  Surgeon: Carlena Bjornstad, MD;  Location: [facility] ENDOSCOPY;  Service: Cardiovascular;  Laterality: N/A;  . Cardioversion N/A 10/17/2013    Procedure: CARDIOVERSION;  Surgeon: Lelon Perla, MD;  Location: [facility] ENDOSCOPY;  Service: Cardiovascular;  Laterality: N/A;  . Permanent pacemaker insertion N/A 07/15/2013    Procedure: PERMANENT PACEMAKER INSERTION;  Surgeon: Deboraha Sprang, MD;  Location: [facility] CATH LAB;  Service: Cardiovascular;  Laterality: N/A;     Allergies  No Known Allergies  History of Present Illness    Levi Lewis is a 48 male  patient of Dr. Marlou Porch and Dr. Caryl Comes with PMH of HTN/HLD/COPD/A-Fib/SSS s/p pacemaker/ Chronic Systolic CHF/ MR and AI. His most recent 2-D echocardiogram on 06/2013 showed a reduced EF of 25-30%, with severe hypokinesis of the anterior septal myocardium, with moderate MR and TR, and PA pressure 48 mmHg. He was last seen in the office on 09/21/2015 where he reported having exertional shortness of breath, and occasional dizziness with position changes. At that time he was told to increase his Lasix dosing, and his carvedilol was reduced to once a day dosing, given his reported symptoms.   Reports he attempted increasing his Lasix dose at home which seemed to improve his dyspnea while at rest but still experienced significant exertional dyspnea over the past couple weeks. He presented to the [facility] ED after sustaining a fall in his bathroom on 10/18/2015 against the sink where he injured his left chest region. Reports he was able to stand and ambulate immediately after with minimal assistance, but did have some dizziness prior to this episode. Also reports having had nausea for several days prior, but no abdominal pain. Denies any chest pain, palpitations, lower extremity edema, or diaphoresis.   In the ED his labs showed Cr 1.95 (baseline appears 1.6~1.9), BNP 721, hemoglobin 12.5, d-dimer 1.43 and chest x-ray with worsening interstitial and bibasilar opacities consistent with progressive  pulmonary edema. EKG shows AV paced rhythm with no acute abnormalities from previous tracings. Was placed on 2L [place], with improvement in breathing and O2 sats. Did have a VQ scan which was negative for PE. Was given one dose of 20 mg IV Lasix yesterday with urine output of 1 L. Was admitted to internal medicine for further workup. Currently on home dose PO lasix. Patient reports he was nauseated earlier this morning and did vomit once.  Inpatient Medications    . apixaban  2.5 mg Oral BID  . carvedilol  3.125 mg Oral QHS  .  furosemide  40 mg Oral Daily   And  . furosemide  20 mg Oral q1800  . gabapentin  300 mg Oral BID  . levothyroxine  75 mcg Oral QAC breakfast  . pantoprazole  40 mg Oral Daily  . sodium chloride flush  3 mL Intravenous Q12H  . tamsulosin  0.4 mg Oral BID    Family History    Family History  Problem Relation Age of Onset  . Heart attack Sister   . Lung cancer Brother     smoker  . Colon cancer Neg Hx     Social History    Social History   Social History  . Marital Status: Married    Spouse Name: N/A  . Number of Children: N/A  . Years of Education: N/A   Occupational History  . Not on file.   Social History Main Topics  . Smoking status: Never Smoker   . Smokeless tobacco: Never Used  . Alcohol Use: No  . Drug Use: No  . Sexual Activity: Not Currently   Other Topics Concern  . Not on file   Social History Narrative   He has never smoked or drank. No illcit drug use . He is retired from the [employer] and lives with his wife. Illicit Drug Use- no     Review of Systems    General:  No chills, fever, night sweats or weight changes.  Cardiovascular:  No chest pain, + dyspnea on exertion, edema, orthopnea, palpitations, paroxysmal nocturnal dyspnea. Dermatological: No rash, lesions/masses Respiratory: No cough, dyspnea Urologic: No hematuria, dysuria Abdominal:   + nausea, + vomiting, diarrhea, bright red blood per rectum, melena, or hematemesis Neurologic:  No visual changes, wkns, changes in mental status, + dizziness Msk: + Chest wall tender to palpation on the left side All other systems reviewed and are otherwise negative except as noted above.  Physical Exam    Blood pressure 135/68, pulse 74, temperature 97.8 F (36.6 C), temperature source Oral, resp. rate 20, height 5\' 11"  (1.803 m), weight 178 lb 6.4 oz (80.922 kg), SpO2 91 %.  General: Pleasant frail older male, NAD wearing [place] @2L  Psych: Normal affect. Neuro: Alert and oriented X 3. Moves all  extremities spontaneously. HEENT: Normal  Neck: Supple without bruits or JVD. Lungs:  Resp regular and unlabored, Diminished, with crackles bilaterally. Heart: RRR no s3, s4, 2/6 systolic murmurs. Abdomen: Soft, non-tender, non-distended, BS + x 4.  Extremities: No clubbing, cyanosis or edema. DP/PT/Radials 2+ and equal bilaterally. Labs    Troponin (Point of Care Test) No results for input(s): TROPIPOC in the last 72 hours. No results for input(s): CKTOTAL, CKMB, TROPONINI in the last 72 hours. Lab Results  Component Value Date   WBC 7.7 10/19/2015   HGB 11.8* 10/19/2015   HCT 36.9* 10/19/2015   MCV 96.6 10/19/2015   PLT 224 10/19/2015    Recent Labs  Lab 10/19/15 0319  NA 142  K 4.0  CL 101  CO2 23  BUN 23*  CREATININE 1.65*  CALCIUM 9.4  PROT 5.5*  BILITOT 1.0  ALKPHOS 40  ALT 11*  AST 15  GLUCOSE 108*   Lab Results  Component Value Date   CHOL 166 09/17/2010   HDL 37.40* 09/17/2010   LDLCALC * 08/07/2008    113        Total Cholesterol/HDL:CHD Risk Coronary Heart Disease Risk Table                     Men   Women  1/2 Average Risk   3.4   3.3  Average Risk       5.0   4.4  2 X Average Risk   9.6   7.1  3 X Average Risk  23.4   11.0        Use the calculated Patient Ratio above and the CHD Risk Table to determine the patient's CHD Risk.        ATP III CLASSIFICATION (LDL):  <100     mg/dL   Optimal  100-129  mg/dL   Near or Above                    Optimal  130-159  mg/dL   Borderline  160-189  mg/dL   High  >190     mg/dL   Very High   TRIG 224.0* 09/17/2010   Lab Results  Component Value Date   DDIMER 1.43* 10/18/2015     Radiology Studies    Dg Chest 2 View  10/18/2015  CLINICAL DATA:  Respiratory distress. EXAM: CHEST  2 VIEW COMPARISON:  10/18/2015, earlier the same day. FINDINGS: Since the previous study there has been increase in vascular congestion with worsening interstitial opacity suggesting evolving pulmonary edema. The cardio  pericardial silhouette is enlarged. Left-sided permanent pacemaker remains in place. Degenerative changes noted in the shoulders. IMPRESSION: Worsening interstitial and basilar alveolar opacities suggest progressing pulmonary edema. Electronically Signed   By: Misty Stanley M.D.   On: 10/18/2015 15:24   Dg Ribs Unilateral W/chest Left  10/18/2015  CLINICAL DATA:  Recent fall with left-sided chest pain, initial encounter EXAM: LEFT RIBS AND CHEST - 3+ VIEW COMPARISON:  09/21/2015 FINDINGS: Cardiac shadow is stable. Pacing device is again noted. Diffuse interstitial changes are again seen and stable. No acute rib fracture is noted. Degenerative changes of the thoracic spine are seen. IMPRESSION: No acute rib fracture noted. Electronically Signed   By: Inez Catalina M.D.   On: 10/18/2015 09:06   Ct Head Wo Contrast  10/18/2015  CLINICAL DATA:  Fall in bathroom this morning. Hit left side of head. Pain. Initial encounter. EXAM: CT HEAD WITHOUT CONTRAST CT CERVICAL SPINE WITHOUT CONTRAST TECHNIQUE: Multidetector CT imaging of the head and cervical spine was performed following the standard protocol without intravenous contrast. Multiplanar CT image reconstructions of the cervical spine were also generated. COMPARISON:  06/15/2014 FINDINGS: CT HEAD FINDINGS There is no evidence of intracranial hemorrhage, brain edema, or other signs of acute infarction. There is no evidence of intracranial mass lesion or mass effect. No abnormal extraaxial fluid collections are identified. Moderate diffuse cerebral atrophy and mild chronic small vessel disease are stable in appearance. Ventricles are stable in size. No evidence of skull fracture. CT CERVICAL SPINE FINDINGS No evidence of acute cervical spine fracture. Multilevel degenerative disc disease is seen which is most severe  at C5-6 and C6-7. Mild to moderate facet DJD is also seen bilaterally at most cervical levels. Mild anterolisthesis at C4-5 measuring 3 mm is attributable  to degenerative changes seen at this level, and appears stable compared to prior exam. IMPRESSION: No acute intracranial abnormality. Stable cerebral atrophy and chronic small vessel disease. No evidence of acute cervical spine fracture. Advanced degenerative spondylosis, as described above. Electronically Signed   By: Earle Gell M.D.   On: 10/18/2015 09:34   Ct Cervical Spine Wo Contrast  10/18/2015  CLINICAL DATA:  Fall in bathroom this morning. Hit left side of head. Pain. Initial encounter. EXAM: CT HEAD WITHOUT CONTRAST CT CERVICAL SPINE WITHOUT CONTRAST TECHNIQUE: Multidetector CT imaging of the head and cervical spine was performed following the standard protocol without intravenous contrast. Multiplanar CT image reconstructions of the cervical spine were also generated. COMPARISON:  06/15/2014 FINDINGS: CT HEAD FINDINGS There is no evidence of intracranial hemorrhage, brain edema, or other signs of acute infarction. There is no evidence of intracranial mass lesion or mass effect. No abnormal extraaxial fluid collections are identified. Moderate diffuse cerebral atrophy and mild chronic small vessel disease are stable in appearance. Ventricles are stable in size. No evidence of skull fracture. CT CERVICAL SPINE FINDINGS No evidence of acute cervical spine fracture. Multilevel degenerative disc disease is seen which is most severe at C5-6 and C6-7. Mild to moderate facet DJD is also seen bilaterally at most cervical levels. Mild anterolisthesis at C4-5 measuring 3 mm is attributable to degenerative changes seen at this level, and appears stable compared to prior exam. IMPRESSION: No acute intracranial abnormality. Stable cerebral atrophy and chronic small vessel disease. No evidence of acute cervical spine fracture. Advanced degenerative spondylosis, as described above. Electronically Signed   By: Earle Gell M.D.   On: 10/18/2015 09:34   Nm Pulmonary Perf And Vent  10/18/2015  CLINICAL DATA:  Shortness  of breath. Fall earlier today. Nonproductive cough. EXAM: NUCLEAR MEDICINE VENTILATION - PERFUSION LUNG SCAN TECHNIQUE: Ventilation images were obtained in multiple projections using inhaled aerosol Tc-28m DTPA. Perfusion images were obtained in multiple projections after intravenous injection of Tc-74m MAA. RADIOPHARMACEUTICALS:  32.7 mCi Technetium-54m DTPA aerosol inhalation and 4.06 mCi Technetium-71m MAA IV COMPARISON:  Chest x-ray today. FINDINGS: Ventilation: Linear nonsegmental defect over the right mid to lower lung. Heterogeneous uptake over the right hilar region. New nonsegmental defect over the posterior left lower lobe. Perfusion: Linear nonsegmental defect over the right mid to lower lung. Heterogeneous uptake over the right hilar region. Linear nonsegmental defect over the posterior left lower lobe. No significant peripheral wedge-shaped mismatched defects. IMPRESSION: Findings compatible a low probability for pulmonary embolism. Electronically Signed   By: Marin Olp M.D.   On: 10/18/2015 14:54    ECG & Cardiac Imaging    EKG: AV paced rhythm  ECHO: 06/2013  Study Conclusions  - Left ventricle: Systolic function was severely reduced. The estimated ejection fraction was in the range of 25% to 30%. When compared to 2010, EF is reduced (45% prior). There is severe hypokinesis of the anteroseptal myocardium. Doppler parameters are consistent with high ventricular filling pressure. - Aortic valve: Mild regurgitation. - Aorta: Aortic root dimension: 31mm (ED). - Mitral valve: Moderate regurgitation. - Left atrium: The atrium was mildly dilated. - Right atrium: The atrium was mildly dilated. - Atrial septum: No defect or patent foramen ovale was identified. - Tricuspid valve: Moderate regurgitation. - Pulmonary arteries: PA peak pressure: 7mm Hg (S). - Pericardium, extracardiac: There  was a left pleural effusion.  Assessment & Plan    Levi Lewis is a 34 male  patient of Dr. Marlou Porch and Dr. Caryl Comes with PMH of HTN/HLD/COPD/A-Fib/SSS s/p pacemaker/ Chronic Systolic CHF/ MR and AI. His most recent 2-D echocardiogram on 06/2013 showed a reduced EF of 25-30%, with severe hypokinesis of the anterior septal myocardium, with moderate MR and TR, and PA pressure 48 mmHg. He was last seen in the office on 09/21/2015 where he reported having exertional shortness of breath, and occasional dizziness with position changes. At that time he was told to increase his Lasix dosing, and his carvedilol was reduced to once a day dosing, given his reported symptoms.   1. Acute on Chronic systolic CHF: He presented to the [facility] ED after falling in his bathroom on 10/18/2015 and hitting the left side of his chest on the sink. X-ray was negative for rib fractures, did show progressive worsening edema. BNP was elevated at 721, Cr 1.9>>1.65. Given one dose 20mg  IV lasix yesterday, currently on PO 40mg  AM and 20mg  PM. Last Echo 06/2013 showed EF 25-30%.  -- Reports he weighs himself daily at home, thinks dry weight is around 180lb? Currently 178lb on admission -- Will continue with current dose of lasix, given he has been nauseated and vomited this morning. Concerned that he may become dehyrdated if dose is increased or changed to IV at this time. Cr has improve today. Will follow how he does with PT regarding O2 sats, and respiratory status.  -- I&Os -- Daily weights   2. PAF: AV Paced on telemetry, appears to be in SR.  --This patients CHA2DS2-VASc Score and unadjusted Ischemic Stroke Rate (% per year) is equal to 4.8 % stroke rate/year from a score of 4 Above score calculated as 1 point each if present [CHF, HTN, DM, Vascular=MI/PAD/Aortic Plaque, Age if 65-74, or Male] Above score calculated as 2 points each if present [Age > 75, or Stroke/TIA/TE] --Continue Apixaban --Amiodarone was stopped after last office visit, currently on coreg 3.125mg  daily  3. HLD  4. SSS s/p PPM  5.  COPD  6. Hypothyroidism  7. CKD III: Monitor Cr level with diuretic   Signed, Reino Bellis, NP-C Pager [phone] 10/19/2015, 10:30 AM  Personally seen and examined. Agree with above.  CXR appearance has worsened. ?edema. Off amiodarone since early June. With recent nausea, vomiting and decreased PO intake, will hold off of further IV administration of lasix. At dry weight. Could try IV 20 lasix tomorrow if condition does not improve.   Candee Furbish, MD

## 2015-10-19 NOTE — Evaluation (Signed)
Physical Therapy Evaluation Patient Details Name: Levi Lewis MRN: [id number] DOB: 1917/12/27 Today's Date: 10/19/2015   History of Present Illness  82 y.o. male with medical history significant for PMP 06/2013 for Afib/ BBB, on Eliquis,CAD, hypertension, gout, dyslipidemia, known lung nodules, COPD, presenting to the emergency department today, for evaluation of injury from fall to his left chest  Clinical Impression  Patient demonstrates deficits in functional mobility as indicated below. Will need continued skilled PT to address deficits and maximize function. Will see as indicated and progress as tolerated.    Follow Up Recommendations Home health PT;Supervision/Assistance - 24 hour    Equipment Recommendations  None recommended by PT    Recommendations for Other Services       Precautions / Restrictions Precautions Precautions: Fall Restrictions Weight Bearing Restrictions: No      Mobility  Bed Mobility Overal bed mobility: Needs Assistance Bed Mobility: Rolling;Sidelying to Sit;Sit to Sidelying Rolling: Min guard Sidelying to sit: Min guard     Sit to sidelying: Min guard General bed mobility comments: min guard for safety. No physical assist required. increased time and effort to perform  Transfers Overall transfer level: Needs assistance Equipment used: Rolling walker (2 wheeled);1 person hand held assist Transfers: Sit to/from Stand Sit to Stand: Min guard         General transfer comment: min guard for stability no physical assist  Ambulation/Gait Ambulation/Gait assistance: Min guard;Min assist Ambulation Distance (Feet): 80 Feet Assistive device: Rolling walker (2 wheeled);Straight cane Gait Pattern/deviations: Step-through pattern;Decreased stride length;Shuffle        Stairs            Wheelchair Mobility    Modified Rankin (Stroke Patients Only)       Balance Overall balance assessment: History of Falls                                            Pertinent Vitals/Pain Pain Assessment: Faces Faces Pain Scale: Hurts even more Pain Location: R abdomen Pain Descriptors / Indicators: Discomfort;Guarding Pain Intervention(s): Limited activity within patient's tolerance;Repositioned;Monitored during session    Home Living Family/patient expects to be discharged to:: Private residence Living Arrangements: Spouse/significant other Available Help at Discharge: Family;Available PRN/intermittently Type of Home: House Home Access: Stairs to enter Entrance Stairs-Rails: Can reach both Entrance Stairs-Number of Steps: 3 Home Layout: One level Home Equipment: Walker - 2 wheels;Cane - single point      Prior Function Level of Independence: Independent with assistive device(s)         Comments: use of a cane PRN     Hand Dominance   Dominant Hand: Right    Extremity/Trunk Assessment   Upper Extremity Assessment: LUE deficits/detail       LUE Deficits / Details: limited ROM from previous hx of injury   Lower Extremity Assessment: Generalized weakness      Cervical / Trunk Assessment: Kyphotic  Communication   Communication: HOH  Cognition Arousal/Alertness: Awake/alert Behavior During Therapy: Flat affect Overall Cognitive Status: Within Functional Limits for tasks assessed                      General Comments General comments (skin integrity, edema, etc.): SpO2 on 2L O2 [place] 97% at rest. Removed pt remained at 95% on RA initially, but decreased to 86% while ambulating, returned to 2L O2 SpO2 increased to  94%.    Exercises        Assessment/Plan    PT Assessment Patient needs continued PT services  PT Diagnosis Difficulty walking;Abnormality of gait;Acute pain   PT Problem List Decreased strength;Decreased activity tolerance;Decreased balance;Decreased mobility;Cardiopulmonary status limiting activity;Pain  PT Treatment Interventions DME instruction;Gait  training;Stair training;Functional mobility training;Therapeutic activities;Therapeutic exercise;Balance training;Patient/family education   PT Goals (Current goals can be found in the Care Plan section) Acute Rehab PT Goals Patient Stated Goal: to get back home PT Goal Formulation: With patient Time For Goal Achievement: 11/02/15 Potential to Achieve Goals: Good    Frequency Min 3X/week   Barriers to discharge        Co-evaluation PT/OT/SLP Co-Evaluation/Treatment: Yes Reason for Co-Treatment: For patient/therapist safety PT goals addressed during session: Mobility/safety with mobility OT goals addressed during session: ADL's and self-care;Proper use of Adaptive equipment and DME       End of Session Equipment Utilized During Treatment: Gait belt, Oxygen Activity Tolerance: Patient limited by fatigue;Patient limited by pain Patient left: in bed;with call bell/phone within reach;with family/visitor present Nurse Communication: Mobility status    Functional Assessment Tool Used: clinical judgement Functional Limitation: Mobility: Walking and moving around Mobility: Walking and Moving Around Current Status [id number]): At least 20 percent but less than 40 percent impaired, limited or restricted Mobility: Walking and Moving Around Goal Status [phone]): At least 1 percent but less than 20 percent impaired, limited or restricted    Time: 1120-1144 PT Time Calculation (min) (ACUTE ONLY): 24 min   Charges:   PT Evaluation $PT Eval Moderate Complexity: 1 Procedure     PT G Codes:   PT G-Codes **NOT FOR INPATIENT CLASS** Functional Assessment Tool Used: clinical judgement Functional Limitation: Mobility: Walking and moving around Mobility: Walking and Moving Around Current Status [id number]): At least 20 percent but less than 40 percent impaired, limited or restricted Mobility: Walking and Moving Around Goal Status [phone]): At least 1 percent but less than 20 percent impaired, limited or  restricted    Levi Lewis 10/19/2015, 1:41 PM [name], [place] DPT  [phone]

## 2015-10-19 NOTE — Progress Notes (Signed)
OT notified RN that patient throwing up at bedside. Went to assess patient, pt nauseous. Family states that he has been nauseous for several days. PRN compazine ordered. Will get am EKG. Will continue to monitor closely.

## 2015-10-19 NOTE — Plan of Care (Signed)
Problem: Education: Goal: Knowledge of [place] [facility] Education information/materials will improve Outcome: Progressing Patient aware of plan of care.  RN provided medication education to patient on all medications administered thus far this shift.  Patient stated understanding.     

## 2015-10-19 NOTE — Care Management Obs Status (Signed)
[place] NOTIFICATION   Patient Details  Name: MILBERT DRESSLER MRN: [id number] Date of Birth: 1917-11-27   Medicare Observation Status Notification Given:  Yes    Josefina Rynders, Rory Percy, RN 10/19/2015, 11:16 AM

## 2015-10-20 ENCOUNTER — Encounter (HOSPITAL_COMMUNITY): Payer: Self-pay | Admitting: General Practice

## 2015-10-20 DIAGNOSIS — I447 Left bundle-branch block, unspecified: Secondary | ICD-10-CM | POA: Diagnosis present

## 2015-10-20 DIAGNOSIS — Y92002 Bathroom of unspecified non-institutional (private) residence single-family (private) house as the place of occurrence of the external cause: Secondary | ICD-10-CM | POA: Diagnosis not present

## 2015-10-20 DIAGNOSIS — Z7901 Long term (current) use of anticoagulants: Secondary | ICD-10-CM | POA: Diagnosis not present

## 2015-10-20 DIAGNOSIS — I5023 Acute on chronic systolic (congestive) heart failure: Secondary | ICD-10-CM | POA: Insufficient documentation

## 2015-10-20 DIAGNOSIS — Z9861 Coronary angioplasty status: Secondary | ICD-10-CM | POA: Diagnosis not present

## 2015-10-20 DIAGNOSIS — R627 Adult failure to thrive: Secondary | ICD-10-CM | POA: Diagnosis present

## 2015-10-20 DIAGNOSIS — I480 Paroxysmal atrial fibrillation: Secondary | ICD-10-CM | POA: Diagnosis present

## 2015-10-20 DIAGNOSIS — I2510 Atherosclerotic heart disease of native coronary artery without angina pectoris: Secondary | ICD-10-CM | POA: Diagnosis present

## 2015-10-20 DIAGNOSIS — Z950 Presence of cardiac pacemaker: Secondary | ICD-10-CM | POA: Diagnosis not present

## 2015-10-20 DIAGNOSIS — K219 Gastro-esophageal reflux disease without esophagitis: Secondary | ICD-10-CM | POA: Diagnosis present

## 2015-10-20 DIAGNOSIS — W19XXXA Unspecified fall, initial encounter: Secondary | ICD-10-CM | POA: Diagnosis not present

## 2015-10-20 DIAGNOSIS — J438 Other emphysema: Secondary | ICD-10-CM | POA: Diagnosis not present

## 2015-10-20 DIAGNOSIS — I429 Cardiomyopathy, unspecified: Secondary | ICD-10-CM | POA: Diagnosis present

## 2015-10-20 DIAGNOSIS — S0990XA Unspecified injury of head, initial encounter: Secondary | ICD-10-CM | POA: Diagnosis present

## 2015-10-20 DIAGNOSIS — T148 Other injury of unspecified body region: Secondary | ICD-10-CM | POA: Diagnosis present

## 2015-10-20 DIAGNOSIS — I130 Hypertensive heart and chronic kidney disease with heart failure and stage 1 through stage 4 chronic kidney disease, or unspecified chronic kidney disease: Secondary | ICD-10-CM | POA: Diagnosis present

## 2015-10-20 DIAGNOSIS — E785 Hyperlipidemia, unspecified: Secondary | ICD-10-CM | POA: Diagnosis present

## 2015-10-20 DIAGNOSIS — E039 Hypothyroidism, unspecified: Secondary | ICD-10-CM | POA: Diagnosis present

## 2015-10-20 DIAGNOSIS — N184 Chronic kidney disease, stage 4 (severe): Secondary | ICD-10-CM | POA: Diagnosis present

## 2015-10-20 DIAGNOSIS — I080 Rheumatic disorders of both mitral and aortic valves: Secondary | ICD-10-CM | POA: Diagnosis present

## 2015-10-20 DIAGNOSIS — Z7952 Long term (current) use of systemic steroids: Secondary | ICD-10-CM | POA: Diagnosis not present

## 2015-10-20 DIAGNOSIS — J9601 Acute respiratory failure with hypoxia: Secondary | ICD-10-CM | POA: Diagnosis present

## 2015-10-20 DIAGNOSIS — J449 Chronic obstructive pulmonary disease, unspecified: Secondary | ICD-10-CM | POA: Diagnosis present

## 2015-10-20 DIAGNOSIS — M1990 Unspecified osteoarthritis, unspecified site: Secondary | ICD-10-CM | POA: Diagnosis present

## 2015-10-20 DIAGNOSIS — I5022 Chronic systolic (congestive) heart failure: Secondary | ICD-10-CM | POA: Diagnosis not present

## 2015-10-20 LAB — BASIC METABOLIC PANEL
Anion gap: 8 (ref 5–15)
BUN: 27 mg/dL — ABNORMAL HIGH (ref 6–20)
CALCIUM: 8.3 mg/dL — AB (ref 8.9–10.3)
CO2: 24 mmol/L (ref 22–32)
CREATININE: 1.64 mg/dL — AB (ref 0.61–1.24)
Chloride: 106 mmol/L (ref 101–111)
GFR calc Af Amer: 39 mL/min — ABNORMAL LOW (ref >60)
GFR calc non Af Amer: 34 mL/min — ABNORMAL LOW (ref >60)
GLUCOSE: 107 mg/dL — AB (ref 65–99)
Potassium: 4.3 mmol/L (ref 3.5–5.1)
Sodium: 138 mmol/L (ref 135–145)

## 2015-10-20 LAB — MAGNESIUM: Magnesium: 2.2 mg/dL (ref 1.7–2.4)

## 2015-10-20 MED ORDER — ALUM & MAG HYDROXIDE-SIMETH 200-200-20 MG/5ML PO SUSP: 30.0000 mL | ORAL | Status: DC | PRN

## 2015-10-20 MED ORDER — FUROSEMIDE 10 MG/ML IJ SOLN
20.0000 mg | Freq: Once | INTRAMUSCULAR | Status: AC
  Administered 2015-10-20: 20 mg via INTRAVENOUS
  Filled 2015-10-20: qty 2

## 2015-10-20 MED ORDER — SENNOSIDES-DOCUSATE SODIUM 8.6-50 MG PO TABS
2.0000 | ORAL_TABLET | Freq: Two times a day (BID) | ORAL | Status: DC
  Administered 2015-10-20 – 2015-10-21 (×3): 2 via ORAL
  Filled 2015-10-20 (×3): qty 2

## 2015-10-20 MED ORDER — MAGNESIUM HYDROXIDE 400 MG/5ML PO SUSP: 30.0000 mL | Freq: Every day | ORAL | Status: DC | PRN

## 2015-10-20 NOTE — Progress Notes (Signed)
PROGRESS NOTE  Levi Lewis [id number] DOB: March 02, 1918 DOA: 10/18/2015 PCP: Criselda Peaches, MD  Brief summary: 82 year old male admitted due to generalized weakness with fall, n/v, found to have   acute hypoxemic respiratory failure, and chf exacerbation, cardiology consulted   HPI/Recap of past 24 hours:   feeling better, no more n/v, o2 dropped to 84% while ambulating in hall , currently on 2liter oxygen, (now oxygen dependent at baseline) Multiple family members in room    Assessment/Plan: Active Problems:   Left bundle branch block   CAD (coronary artery disease)   Dyslipidemia   COPD (chronic obstructive pulmonary disease) ([place])   CKD (chronic kidney disease) stage 4, GFR 15-29 ml/min (HCC)   Cardiomyopathy, EF by echo 25-30%   Pacemaker   Chronic systolic CHF (congestive heart failure) ([place])   HX: anticoagulation   Acute hypoxemic respiratory failure ([place])   Fall   Acute hypoxic respiratory failure:  likely from chf exacerbation, no fever, no leukocytosis, VQ scan low probability of PE and he is already on anticoagulation,  cxr consistent with fluids overload, oxygen supplement , treat chf. Wean oxygen.  Acute on chronic systolic chf: last ED [id number] in 2015. S/p CRT-P in 06/2013. On coreg/ IV lasix, cardiology consulted, input appreciated.  PAF with bundle branch block: s/p CRT-p, on coreg/apixaban.   CKDIII: cr at baseline, renal dosing meds  Hypothyroidism: continue synthroid  Chronic arthritis: on daily prednisone.  FTT, falls need PT/OT, will need home health    Code Status: full  Family Communication: patient and son in room  Disposition Plan: home with home health in 1-2 days, may need home o2.   Consultants:  cardiology  Procedures:  none  Antibiotics:  none   Objective: BP 101/46 mmHg  Pulse 70  Temp(Src) 98.1 F (36.7 C) (Oral)  Resp 14  Ht 5\' 11"  (1.803 m)  Wt 82.01 kg (180 lb 12.8 oz)  BMI 25.23 kg/m2  SpO2  94%  Intake/Output Summary (Last 24 hours) at 10/20/15 1020 Last data filed at 10/20/15 [id number]  Gross per 24 hour  Intake    920 ml  Output    775 ml  Net    145 ml   Filed Weights   10/18/15 1547 10/19/15 0553 10/20/15 0301  Weight: 81.33 kg (179 lb 4.8 oz) 80.922 kg (178 lb 6.4 oz) 82.01 kg (180 lb 12.8 oz)    Exam:   General:  Frail, alert, NAD, on 2liter oxygen  Cardiovascular: pace rhythm  Respiratory: less bibasilar crackles, no wheezing, no rhonchi  Abdomen: Soft/ND/NT, positive BS  Musculoskeletal: No Edema  Neuro: aaox3  Data Reviewed: Basic Metabolic Panel:  Recent Labs Lab 10/18/15 0822 10/19/15 0319 10/20/15 0357  NA 141 142 138  K 4.1 4.0 4.3  CL 112* 101 106  CO2 21* 23 24  GLUCOSE 121* 108* 107*  BUN 24* 23* 27*  CREATININE 1.95* 1.65* 1.64*  CALCIUM 9.0 9.4 8.3*  MG  --   --  2.2   Liver Function Tests:  Recent Labs Lab 10/19/15 0319  AST 15  ALT 11*  ALKPHOS 40  BILITOT 1.0  PROT 5.5*  ALBUMIN 2.8*   No results for input(s): LIPASE, AMYLASE in the last 168 hours. No results for input(s): AMMONIA in the last 168 hours. CBC:  Recent Labs Lab 10/18/15 0822 10/19/15 0319  WBC 10.2 7.7  NEUTROABS 7.2  --   HGB 12.5* 11.8*  HCT 38.3* 36.9*  MCV 96.0 96.6  PLT  247 224   Cardiac Enzymes:   No results for input(s): CKTOTAL, CKMB, CKMBINDEX, TROPONINI in the last 168 hours. BNP (last 3 results)  Recent Labs  10/18/15 0822  BNP 721.7*    ProBNP (last 3 results) No results for input(s): PROBNP in the last 8760 hours.  CBG: No results for input(s): GLUCAP in the last 168 hours.  No results found for this or any previous visit (from the past 240 hour(s)).   Studies: No results found.  Scheduled Meds: . antiseptic oral rinse  7 mL Mouth Rinse BID  . apixaban  2.5 mg Oral BID  . carvedilol  3.125 mg Oral QHS  . furosemide  40 mg Oral Daily   And  . furosemide  20 mg Oral q1800  . gabapentin  300 mg Oral BID  .  levothyroxine  75 mcg Oral QAC breakfast  . pantoprazole  40 mg Oral Daily  . sodium chloride flush  3 mL Intravenous Q12H  . tamsulosin  0.4 mg Oral BID    Continuous Infusions:    Time spent: 57mins  Kaleiyah Polsky MD, PhD  [facility] Pager [phone]. If 7PM-7AM, please contact night-coverage at [email or web address], password [facility] 10/20/2015, 10:20 AM

## 2015-10-20 NOTE — Progress Notes (Signed)
Physical Therapy Treatment Patient Details Name: Levi Lewis MRN: [id number] DOB: [birth year] Today's Date: 10/20/2015    History of Present Illness 82 y.o. male with medical history significant for PMP 06/2013 for Afib/ BBB, on Eliquis,CAD, hypertension, gout, dyslipidemia, known lung nodules, COPD, presenting to the emergency department today, for evaluation of injury from fall to his left chest    PT Comments    Patient seen for mobility progression and ambulation. Patient ambulated on room air using cane. Required physical assist during mobility today due to RLE buckling and pain. Patient continues to demonstrate desaturations with activity. Will continue to see and progress as tolerated. Continue to recommend use of RW rather than cane but will continue to work with cane while here. OF NOTE:SpO2 on 2L O2 [place] 99% at rest. patient still desaturating with mobility, SpO2 decreased to 84% while ambulating in hall with use of cane. 89% at rest on room air. Improved to 95% when O2 reapplied.  Follow Up Recommendations  Home health PT;Supervision/Assistance - 24 hour     Equipment Recommendations  None recommended by PT    Recommendations for Other Services       Precautions / Restrictions Precautions Precautions: Fall Restrictions Weight Bearing Restrictions: No    Mobility  Bed Mobility Overal bed mobility: Needs Assistance Bed Mobility: Rolling;Sidelying to Sit;Sit to Sidelying Rolling: Min guard Sidelying to sit: Min guard     Sit to sidelying: Min guard General bed mobility comments: increased time to perform, noted grimacing with transitional movement due to pain, min guard for safety  Transfers Overall transfer level: Needs assistance Equipment used: Rolling walker (2 wheeled);1 person hand held assist (HHA used to stand from bed, RW from toilet) Transfers: Sit to/from Stand Sit to Stand: Min assist         General transfer comment: min assist for stability in  coming to standing, noted RLE knee pain and buckling. flexed posture initially  Ambulation/Gait Ambulation/Gait assistance: Min assist Ambulation Distance (Feet): 100 Feet Assistive device: Straight cane Gait Pattern/deviations: Antalgic;Step-through pattern;Decreased stride length;Shuffle Gait velocity: decreased Gait velocity interpretation: <1.8 ft/sec, indicative of risk for recurrent falls General Gait Details: instability noted with ambulation, patient reporting knee giving out (says this happens frequently).    Stairs            Wheelchair Mobility    Modified Rankin (Stroke Patients Only)       Balance Overall balance assessment: History of Falls                                  Cognition Arousal/Alertness: Awake/alert Behavior During Therapy: Flat affect Overall Cognitive Status: Within Functional Limits for tasks assessed                      Exercises      General Comments General comments (skin integrity, edema, etc.): SpO2 on 2L O2 [place] 99% at rest. patient still desaturating with mobility, SpO2 decreased to 84% while ambulating in hall with use of cane. 89% at rest on room air. Improved to 95% when O2 reapplied.      Pertinent Vitals/Pain Pain Assessment: Faces Faces Pain Scale: Hurts little more Pain Location: right knee and left side Pain Descriptors / Indicators: Sore Pain Intervention(s): Monitored during session    Home Living  Prior Function            PT Goals (current goals can now be found in the care plan section) Acute Rehab PT Goals Patient Stated Goal: to get better PT Goal Formulation: With patient Time For Goal Achievement: 11/02/15 Potential to Achieve Goals: Good Progress towards PT goals: Progressing toward goals    Frequency  Min 3X/week    PT Plan Current plan remains appropriate    Co-evaluation             End of Session Equipment Utilized During  Treatment: Gait belt Activity Tolerance: Patient limited by fatigue;Patient limited by pain Patient left: in bed;with call bell/phone within reach;with family/visitor present     Time: 1040-1057 PT Time Calculation (min) (ACUTE ONLY): 17 min  Charges:  $Gait Training: 8-22 mins                    G CodesDuncan Dull 11/14/2015, 11:29 AM [name], PT DPT  [phone]

## 2015-10-20 NOTE — Plan of Care (Signed)
Problem: Pain Managment: Goal: General experience of comfort will improve Outcome: Progressing Patient has been complaining of left lateral rib cage pain, PRN Norco administered, effective in relieving pain per patient (see flowsheets/MAR for further documentation).  Will continue to monitor.

## 2015-10-20 NOTE — Progress Notes (Signed)
Cardiologist: Hampton Abbot Subjective:  Had a good night sleep. Shortness of breath mildly improved. Has not had a bowel movement since hospitalization.  Objective:  Vital Signs in the last 24 hours: Temp:  [97.8 F (36.6 C)-98.1 F (36.7 C)] 98.1 F (36.7 C) (07/04 0301) Pulse Rate:  [70-78] 70 (07/04 0301) Resp:  [14-20] 14 (07/04 0301) BP: (101-135)/(46-68) 101/46 mmHg (07/04 0301) SpO2:  [91 %-96 %] 94 % (07/04 0301) Weight:  [180 lb 12.8 oz (82.01 kg)] 180 lb 12.8 oz (82.01 kg) (07/04 0301)  Intake/Output from previous day: 07/03 0701 - 07/04 0700 In: 800 [P.O.:800] Out: 775 [Urine:775]   Physical Exam: General: Elderly, in no acute distress. Head:  Normocephalic and trauma from fall noted. Lungs: Crackles heard bases of lung field especially left lower. Heart: Normal S1 and S2.  No murmur, rubs or gallops.  Abdomen: soft, non-tender, positive bowel sounds. Extremities: No clubbing or cyanosis. No edema. Neurologic: Alert and oriented x 3.    Lab Results:  Recent Labs  10/18/15 0822 10/19/15 0319  WBC 10.2 7.7  HGB 12.5* 11.8*  PLT 247 224    Recent Labs  10/19/15 0319 10/20/15 0357  NA 142 138  K 4.0 4.3  CL 101 106  CO2 23 24  GLUCOSE 108* 107*  BUN 23* 27*  CREATININE 1.65* 1.64*   No results for input(s): TROPONINI in the last 72 hours.  Invalid input(s): CK, MB Hepatic Function Panel  Recent Labs  10/19/15 0319  PROT 5.5*  ALBUMIN 2.8*  AST 15  ALT 11*  ALKPHOS 40  BILITOT 1.0   No results for input(s): CHOL in the last 72 hours. No results for input(s): PROTIME in the last 72 hours.  Imaging: Dg Chest 2 View  10/18/2015  CLINICAL DATA:  Respiratory distress. EXAM: CHEST  2 VIEW COMPARISON:  10/18/2015, earlier the same day. FINDINGS: Since the previous study there has been increase in vascular congestion with worsening interstitial opacity suggesting evolving pulmonary edema. The cardio pericardial silhouette is enlarged.  Left-sided permanent pacemaker remains in place. Degenerative changes noted in the shoulders. IMPRESSION: Worsening interstitial and basilar alveolar opacities suggest progressing pulmonary edema. Electronically Signed   By: Misty Stanley M.D.   On: 10/18/2015 15:24   Dg Ribs Unilateral W/chest Left  10/18/2015  CLINICAL DATA:  Recent fall with left-sided chest pain, initial encounter EXAM: LEFT RIBS AND CHEST - 3+ VIEW COMPARISON:  09/21/2015 FINDINGS: Cardiac shadow is stable. Pacing device is again noted. Diffuse interstitial changes are again seen and stable. No acute rib fracture is noted. Degenerative changes of the thoracic spine are seen. IMPRESSION: No acute rib fracture noted. Electronically Signed   By: Inez Catalina M.D.   On: 10/18/2015 09:06   Ct Head Wo Contrast  10/18/2015  CLINICAL DATA:  Fall in bathroom this morning. Hit left side of head. Pain. Initial encounter. EXAM: CT HEAD WITHOUT CONTRAST CT CERVICAL SPINE WITHOUT CONTRAST TECHNIQUE: Multidetector CT imaging of the head and cervical spine was performed following the standard protocol without intravenous contrast. Multiplanar CT image reconstructions of the cervical spine were also generated. COMPARISON:  06/15/2014 FINDINGS: CT HEAD FINDINGS There is no evidence of intracranial hemorrhage, brain edema, or other signs of acute infarction. There is no evidence of intracranial mass lesion or mass effect. No abnormal extraaxial fluid collections are identified. Moderate diffuse cerebral atrophy and mild chronic small vessel disease are stable in appearance. Ventricles are stable in size. No evidence of skull fracture.  CT CERVICAL SPINE FINDINGS No evidence of acute cervical spine fracture. Multilevel degenerative disc disease is seen which is most severe at C5-6 and C6-7. Mild to moderate facet DJD is also seen bilaterally at most cervical levels. Mild anterolisthesis at C4-5 measuring 3 mm is attributable to degenerative changes seen at this  level, and appears stable compared to prior exam. IMPRESSION: No acute intracranial abnormality. Stable cerebral atrophy and chronic small vessel disease. No evidence of acute cervical spine fracture. Advanced degenerative spondylosis, as described above. Electronically Signed   By: Earle Gell M.D.   On: 10/18/2015 09:34   Ct Cervical Spine Wo Contrast  10/18/2015  CLINICAL DATA:  Fall in bathroom this morning. Hit left side of head. Pain. Initial encounter. EXAM: CT HEAD WITHOUT CONTRAST CT CERVICAL SPINE WITHOUT CONTRAST TECHNIQUE: Multidetector CT imaging of the head and cervical spine was performed following the standard protocol without intravenous contrast. Multiplanar CT image reconstructions of the cervical spine were also generated. COMPARISON:  06/15/2014 FINDINGS: CT HEAD FINDINGS There is no evidence of intracranial hemorrhage, brain edema, or other signs of acute infarction. There is no evidence of intracranial mass lesion or mass effect. No abnormal extraaxial fluid collections are identified. Moderate diffuse cerebral atrophy and mild chronic small vessel disease are stable in appearance. Ventricles are stable in size. No evidence of skull fracture. CT CERVICAL SPINE FINDINGS No evidence of acute cervical spine fracture. Multilevel degenerative disc disease is seen which is most severe at C5-6 and C6-7. Mild to moderate facet DJD is also seen bilaterally at most cervical levels. Mild anterolisthesis at C4-5 measuring 3 mm is attributable to degenerative changes seen at this level, and appears stable compared to prior exam. IMPRESSION: No acute intracranial abnormality. Stable cerebral atrophy and chronic small vessel disease. No evidence of acute cervical spine fracture. Advanced degenerative spondylosis, as described above. Electronically Signed   By: Earle Gell M.D.   On: 10/18/2015 09:34   Nm Pulmonary Perf And Vent  10/18/2015  CLINICAL DATA:  Shortness of breath. Fall earlier today.  Nonproductive cough. EXAM: NUCLEAR MEDICINE VENTILATION - PERFUSION LUNG SCAN TECHNIQUE: Ventilation images were obtained in multiple projections using inhaled aerosol Tc-29m DTPA. Perfusion images were obtained in multiple projections after intravenous injection of Tc-29m MAA. RADIOPHARMACEUTICALS:  32.7 mCi Technetium-8m DTPA aerosol inhalation and 4.06 mCi Technetium-29m MAA IV COMPARISON:  Chest x-ray today. FINDINGS: Ventilation: Linear nonsegmental defect over the right mid to lower lung. Heterogeneous uptake over the right hilar region. New nonsegmental defect over the posterior left lower lobe. Perfusion: Linear nonsegmental defect over the right mid to lower lung. Heterogeneous uptake over the right hilar region. Linear nonsegmental defect over the posterior left lower lobe. No significant peripheral wedge-shaped mismatched defects. IMPRESSION: Findings compatible a low probability for pulmonary embolism. Electronically Signed   By: Marin Olp M.D.   On: 10/18/2015 14:54   Personally viewed.   Telemetry: AV pacing Personally viewed.    Cardiac Studies:  Echo-EF 25-30%  Meds: Scheduled Meds: . antiseptic oral rinse  7 mL Mouth Rinse BID  . apixaban  2.5 mg Oral BID  . carvedilol  3.125 mg Oral QHS  . furosemide  40 mg Oral Daily   And  . furosemide  20 mg Oral q1800  . gabapentin  300 mg Oral BID  . levothyroxine  75 mcg Oral QAC breakfast  . pantoprazole  40 mg Oral Daily  . sodium chloride flush  3 mL Intravenous Q12H  . tamsulosin  0.4  mg Oral BID   Continuous Infusions:  PRN Meds:.acetaminophen **OR** acetaminophen, bisacodyl, guaiFENesin, HYDROcodone-acetaminophen, magnesium citrate, prochlorperazine, senna-docusate, technetium TC 48M diethylenetriame-pentaacetic acid, triazolam  Assessment/Plan:  Active Problems:   Left bundle branch block   CAD (coronary artery disease)   Dyslipidemia   COPD (chronic obstructive pulmonary disease) (HCC)   CKD (chronic kidney  disease) stage 4, GFR 15-29 ml/min (HCC)   Cardiomyopathy, EF by echo 25-30%   Pacemaker   Chronic systolic CHF (congestive heart failure) (HCC)   HX: anticoagulation   Acute hypoxemic respiratory failure ([place])   Fall  Levi Lewis is a 15 male patient of Dr. Marlou Porch and Dr. Caryl Comes with PMH of HTN/HLD/COPD/A-Fib/SSS s/p pacemaker/ Chronic Systolic CHF/ MR and AI. His most recent 2-D echocardiogram on 06/2013 showed a reduced EF of 25-30%, with severe hypokinesis of the anterior septal myocardium, with moderate MR and TR, and PA pressure 48 mmHg. He was last seen in the office on 09/21/2015 where he reported having exertional shortness of breath, and occasional dizziness with position changes. At that time he was told to increase his Lasix dosing, and his carvedilol was reduced to once a day dosing, given his reported symptoms.   1. Acute on Chronic systolic CHF: He presented to the [facility] ED after falling in his bathroom on 10/18/2015 and hitting the left side of his chest on the sink. X-ray was negative for rib fractures, did show progressive worsening edema. BNP was elevated at 721, Cr 1.9>>1.65. Given one dose 20mg  IV lasix yesterday, currently on PO 40mg  AM and 20mg  PM. Last Echo 06/2013 showed EF 25-30%.  -- Reports he weighs himself daily at home, thinks dry weight is around 180lb? Currently 180lb  -- Will give an additional dose of Lasix 20 mg IV 1 as we had done previously on admission and continue with current home dose of lasix.  Cr is stable. Will follow how he does with PT regarding O2 sats, and respiratory status.  -- I&Os -- Daily weights   2. PAF: AV Paced on telemetry, appears to be in SR.  --This patients CHA2DS2-VASc Score and unadjusted Ischemic Stroke Rate (% per year) is equal to 4.8 % stroke rate/year from a score of 4 Above score calculated as 1 point each if present [CHF, HTN, DM, Vascular=MI/PAD/Aortic Plaque, Age if 65-74, or Male] Above score calculated as 2 points each if  present [Age > 75, or Stroke/TIA/TE] --Continue Apixaban --Amiodarone was stopped after last office visit in June, currently on coreg 3.125mg  daily  3. HLD  4. SSS s/p PPM  5. COPD  6. Hypothyroidism  7. CKD III: Monitor Cr level with diuretic   I made sure he had when necessary orders for laxatives.  Candee Furbish 10/20/2015, 8:49 AM

## 2015-10-20 NOTE — Progress Notes (Signed)
Nutrition Brief Note  RD consulted for poor po intake.   Wt Readings from Last 15 Encounters:  10/20/15 180 lb 12.8 oz (82.01 kg)  09/21/15 181 lb 3.2 oz (82.192 kg)  06/22/15 179 lb (81.194 kg)  12/15/14 179 lb 3.2 oz (81.285 kg)  08/12/14 181 lb 3.2 oz (82.192 kg)  07/11/14 176 lb 12.8 oz (80.196 kg)  04/04/14 177 lb 9.6 oz (80.559 kg)  12/20/13 178 lb (80.74 kg)  11/22/13 180 lb 12.8 oz (82.01 kg)  10/24/13 172 lb 14.4 oz (78.427 kg)  10/15/13 183 lb (83.008 kg)  09/25/13 182 lb (82.555 kg)  09/19/13 179 lb (81.194 kg)  09/03/13 180 lb (81.647 kg)  08/05/13 180 lb 12.8 oz (82.01 kg)   82 yo male with PMH of HTN/HLD/COPD/A-Fib/SSS s/p pacemaker/ Chronic systolic CHF/MR and AI who presented to [facility] ED after a fall with nausea and dyspnea.   Pt admitted with generalized weakness and acute respiratory failure.   Hx obtained from pt, pt son, and pt grandson and bedside. All confirm that pt generally has a good appetite, consuming 3 meals per day. Per pt report, he consumes cereal and eggs for breakfast, yogurt or snack for lunch, and meat/starch, vegetable for dinner. Pt son reports that family members assist with meal preparation. He denies any weight loss, reporting he was been gaining weight related to improved PO intake.   Pt is fairly active and independent at baseline for his age; he shares he is the primary caretaker of his wife of 54 years, who has vision impairments.   Pt reveals his appetite was poor 3-4 days PTA secondary to nausea, but continues to improve during hospitalization. Per pt grandson, pt consumed almost 100% of breakfast ("he only left a bite of potatoes left on his plate").   Nutrition-Focused physical exam completed. Findings are mild fat depletion, mild muscle depletion, and no edema. Suspect fat and muscle depletion is related to advanced age.   Pt declines any nutrition supplements. His main beverage source at home is 2% milk.  Body mass index  is 25.23 kg/(m^2). Patient meets criteria for normal weight range based on current BMI.   Current diet order is Heart Healthy, patient is consuming approximately 25-90% of meals at this time. Labs and medications reviewed.   No nutrition interventions warranted at this time. If nutrition issues arise, please consult RD.   Aarian Cleaver A. Jimmye Norman, RD, LDN, CDE Pager: [phone] After hours Pager: [phone]

## 2015-10-20 NOTE — Progress Notes (Signed)
Occupational Therapy Treatment Patient Details Name: Levi Lewis MRN: [id number] DOB: 18-Oct-1917 Today's Date: 10/20/2015    History of present illness 82 y.o. male with medical history significant for PMP 06/2013 for Afib/ BBB, on Eliquis,CAD, hypertension, gout, dyslipidemia, known lung nodules, COPD, presenting to the emergency department today, for evaluation of injury from fall to his left chest   OT comments  Pt making slow, but steady progress with ADL and mobility performance. Pt continues to require min assist for all basic transfers due to balance deficits and declines to use RW although it is the safer option at this time. Pt's SpO2 on 2L O2 [place] 97% at rest. Pt desaturating with bathing tasks to 88% on RA, increased to 93% with cues for breathing strategies. POC and discharge disposition remain appropriate. Will continue to follow acutely.  Follow Up Recommendations  Home health OT;Supervision/Assistance - 24 hour    Equipment Recommendations  3 in 1 bedside comode    Recommendations for Other Services      Precautions / Restrictions Precautions Precautions: Fall Restrictions Weight Bearing Restrictions: No       Mobility Bed Mobility Overal bed mobility: Needs Assistance Bed Mobility: Supine to Sit;Sit to Supine     Supine to sit: Min guard Sit to supine: Min guard   General bed mobility comments: Increased time and effort to exit L side of bed due to pain. No physical assist provided.  Transfers Overall transfer level: Needs assistance Equipment used: Straight cane Transfers: Sit to/from Stand Sit to Stand: Min assist         General transfer comment: Assist to stabilize upon standing with SPC. VCs for safety and line management.     Balance Overall balance assessment: Needs assistance Sitting-balance support: No upper extremity supported;Feet supported Sitting balance-Leahy Scale: Good     Standing balance support: Single extremity supported;During  functional activity Standing balance-Leahy Scale: Poor Standing balance comment: Reliant on UE support to maintain balance for static and dynamic balance tasks                   ADL Overall ADL's : Needs assistance/impaired     Grooming: Wash/dry hands;Wash/dry face;Set up;Sitting   Upper Body Bathing: Set up;Sitting   Lower Body Bathing: Minimal assistance;Sit to/from stand Lower Body Bathing Details (indicate cue type and reason): assist to reach feet     Lower Body Dressing: Minimal assistance;Sit to/from stand Lower Body Dressing Details (indicate cue type and reason): to don/doff socks Toilet Transfer: Minimal assistance;Ambulation;BSC;Cueing for safety [facility])   Toileting- Clothing Manipulation and Hygiene: Minimal assistance;Sit to/from stand       Functional mobility during ADLs: Minimal assistance;Cane General ADL Comments: SpO2 on 2L O2 remained at 97% at rest. Removed O2 for bathing at sink and SpO2 dropped to 88%, but increased to 93% with VCs for breathing strategies.      Vision                     Perception     Praxis      Cognition   Behavior During Therapy: [facility] for tasks assessed/performed Overall Cognitive Status: Within Functional Limits for tasks assessed                       Extremity/Trunk Assessment               Exercises     Shoulder Instructions       General Comments  Pertinent Vitals/ Pain       Pain Assessment: 0-10 Pain Score: 7  Pain Location: L ribs Pain Descriptors / Indicators: Grimacing;Guarding;Sore Pain Intervention(s): Limited activity within patient's tolerance;Monitored during session;Repositioned  Home Living                                          Prior Functioning/Environment              Frequency Min 3X/week     Progress Toward Goals  OT Goals(current goals can now be found in the care plan section)  Progress towards OT goals: Progressing toward  goals  Acute Rehab OT Goals Patient Stated Goal: to get better OT Goal Formulation: With patient Time For Goal Achievement: 11/02/15 Potential to Achieve Goals: Good ADL Goals Pt Will Perform Grooming: with modified independence;standing Pt Will Perform Upper Body Bathing: with modified independence;sitting Pt Will Perform Lower Body Bathing: with modified independence;sit to/from stand Pt Will Transfer to Toilet: with modified independence;ambulating;bedside commode Pt Will Perform Toileting - Clothing Manipulation and hygiene: with modified independence;sit to/from stand  Plan Frequency needs to be updated    Co-evaluation                 End of Session Equipment Utilized During Treatment: Gait belt;Rolling walker;Oxygen   Activity Tolerance Patient tolerated treatment well   Patient Left in bed;with call bell/phone within reach;with family/visitor present   Nurse Communication Mobility status    Functional Assessment Tool Used: clinical judgement Functional Limitation: Self care Self Care Current Status [id number]): At least 1 percent but less than 20 percent impaired, limited or restricted Self Care Goal Status [id number]): At least 1 percent but less than 20 percent impaired, limited or restricted   Time: 1329-1358 OT Time Calculation (min): 29 min  Charges: OT G-codes **NOT FOR INPATIENT CLASS** Functional Assessment Tool Used: clinical judgement Functional Limitation: Self care Self Care Current Status [id number]): At least 1 percent but less than 20 percent impaired, limited or restricted Self Care Goal Status [id number]): At least 1 percent but less than 20 percent impaired, limited or restricted OT General Charges $OT Visit: 1 Procedure OT Treatments $Self Care/Home Management : 23-37 mins  Redmond Baseman, OTR/L Pager: [phone] 10/20/2015, 3:59 PM

## 2015-10-21 DIAGNOSIS — I5023 Acute on chronic systolic (congestive) heart failure: Secondary | ICD-10-CM

## 2015-10-21 DIAGNOSIS — N184 Chronic kidney disease, stage 4 (severe): Secondary | ICD-10-CM

## 2015-10-21 DIAGNOSIS — W19XXXA Unspecified fall, initial encounter: Secondary | ICD-10-CM

## 2015-10-21 LAB — BASIC METABOLIC PANEL
Anion gap: 10 (ref 5–15)
BUN: 28 mg/dL — ABNORMAL HIGH (ref 6–20)
CALCIUM: 8.4 mg/dL — AB (ref 8.9–10.3)
CO2: 25 mmol/L (ref 22–32)
CREATININE: 1.73 mg/dL — AB (ref 0.61–1.24)
Chloride: 104 mmol/L (ref 101–111)
GFR, EST AFRICAN AMERICAN: 37 mL/min — AB (ref >60)
GFR, EST NON AFRICAN AMERICAN: 32 mL/min — AB (ref >60)
Glucose, Bld: 112 mg/dL — ABNORMAL HIGH (ref 65–99)
Potassium: 4.3 mmol/L (ref 3.5–5.1)
SODIUM: 139 mmol/L (ref 135–145)

## 2015-10-21 LAB — MAGNESIUM: MAGNESIUM: 2.1 mg/dL (ref 1.7–2.4)

## 2015-10-21 MED ORDER — FUROSEMIDE 40 MG PO TABS: 40.0000 mg | ORAL_TABLET | Freq: Every day | ORAL | Status: DC

## 2015-10-21 MED ORDER — FUROSEMIDE 80 MG PO TABS: 80.0000 mg | ORAL_TABLET | Freq: Every day | ORAL | Status: DC

## 2015-10-21 NOTE — Progress Notes (Signed)
Cardiologist: Hampton Abbot Subjective:   Seems to be doing a little better. No CP. Left rib pain.   Objective:  Vital Signs in the last 24 hours: Temp:  [97.7 F (36.5 C)-98.2 F (36.8 C)] 98.2 F (36.8 C) (07/05 0524) Pulse Rate:  [71-74] 74 (07/05 0524) Resp:  [19-22] 20 (07/05 0524) BP: (93-105)/(47-59) 105/59 mmHg (07/05 0524) SpO2:  [94 %-97 %] 94 % (07/05 0524) Weight:  [181 lb 8 oz (82.328 kg)] 181 lb 8 oz (82.328 kg) (07/05 0524)  Intake/Output from previous day: 07/04 0701 - 07/05 0700 In: 720 [P.O.:720] Out: 375 [Urine:375]   Physical Exam: General: Elderly, in no acute distress. Head:  Normocephalic and trauma from fall noted. Lungs: Crackles heard bases of lung field especially left lower, similar to prior exam. Heart: Normal S1 and S2.  No murmur, rubs or gallops.  Abdomen: soft, non-tender, positive bowel sounds. Extremities: No clubbing or cyanosis. No edema. Neurologic: Alert and oriented x 3.    Lab Results:  Recent Labs  10/19/15 0319  WBC 7.7  HGB 11.8*  PLT 224    Recent Labs  10/20/15 0357 10/21/15 0352  NA 138 139  K 4.3 4.3  CL 106 104  CO2 24 25  GLUCOSE 107* 112*  BUN 27* 28*  CREATININE 1.64* 1.73*   No results for input(s): TROPONINI in the last 72 hours.  Invalid input(s): CK, MB Hepatic Function Panel  Recent Labs  10/19/15 0319  PROT 5.5*  ALBUMIN 2.8*  AST 15  ALT 11*  ALKPHOS 40  BILITOT 1.0     Telemetry: AV pacing Personally viewed.    Cardiac Studies:  Echo-EF 25-30%  Meds: Scheduled Meds: . antiseptic oral rinse  7 mL Mouth Rinse BID  . apixaban  2.5 mg Oral BID  . carvedilol  3.125 mg Oral QHS  . furosemide  40 mg Oral Daily   And  . furosemide  20 mg Oral q1800  . gabapentin  300 mg Oral BID  . levothyroxine  75 mcg Oral QAC breakfast  . pantoprazole  40 mg Oral Daily  . senna-docusate  2 tablet Oral BID  . sodium chloride flush  3 mL Intravenous Q12H  . tamsulosin  0.4 mg Oral BID    Continuous Infusions:  PRN Meds:.acetaminophen **OR** acetaminophen, alum & mag hydroxide-simeth, bisacodyl, guaiFENesin, HYDROcodone-acetaminophen, magnesium citrate, magnesium hydroxide, prochlorperazine, senna-docusate, technetium TC 9M diethylenetriame-pentaacetic acid, triazolam  Assessment/Plan:  Active Problems:   Left bundle branch block   CAD (coronary artery disease)   Dyslipidemia   COPD (chronic obstructive pulmonary disease) (HCC)   CKD (chronic kidney disease) stage 4, GFR 15-29 ml/min (HCC)   Cardiomyopathy, EF by echo 25-30%   Pacemaker   Chronic systolic CHF (congestive heart failure) (HCC)   HX: anticoagulation   Acute hypoxemic respiratory failure (HCC)   Fall   Acute on chronic systolic CHF (congestive heart failure) (HCC)  Levi Lewis is a 39 male patient of Dr. Marlou Porch and Dr. Caryl Comes with PMH of HTN/HLD/COPD/A-Fib/SSS s/p pacemaker/ Chronic Systolic CHF/ MR and AI. His most recent 2-D echocardiogram on 06/2013 showed a reduced EF of 25-30%, with severe hypokinesis of the anterior septal myocardium, with moderate MR and TR, and PA pressure 48 mmHg. He was last seen in the office on 09/21/2015 where he reported having exertional shortness of breath, and occasional dizziness with position changes. At that time he was told to increase his Lasix dosing, and his carvedilol was reduced to once a day dosing,  given his reported symptoms.   1. Acute on Chronic systolic CHF: He presented to the [facility] ED after falling in his bathroom on 10/18/2015 and hitting the left side of his chest on the sink (turned in the bathroom and fell, no syncope). X-ray was negative for rib fractures, did show progressive worsening edema. BNP was elevated at 721, Cr 1.9>>1.65. Given 20mg  IV lasix yesterday. --Will increase home lasix to 80AM/ 40PM. (previously 40/20). Last Echo 06/2013 showed EF 25-30%.  -- Reports he weighs himself daily at home, thinks dry weight is around 180lb? Currently 180lb  -- Will  gave an additional dose of Lasix 20 mg IV 1 yesterday on 7/4 as we had done previously on admission and continue with current home dose of lasix.  Cr slightly increased. Will follow how he does with PT regarding O2 sats, and respiratory status.  -- I&Os -- Daily weights   2. PAF: AV Paced on telemetry, appears to be in SR.  --This patients CHA2DS2-VASc Score and unadjusted Ischemic Stroke Rate (% per year) is equal to 4.8 % stroke rate/year from a score of 4 Above score calculated as 1 point each if present [CHF, HTN, DM, Vascular=MI/PAD/Aortic Plaque, Age if 65-74, or Male] Above score calculated as 2 points each if present [Age > 75, or Stroke/TIA/TE] --Continue Apixaban --Amiodarone was stopped after last office visit in June, currently on coreg 3.125mg  daily  3. HLD  4. SSS s/p PPM  5. COPD  6. Hypothyroidism  7. CKD III: Monitor Cr level with diuretic   OK with DC home from cards perspective.  Candee Furbish 10/21/2015, 9:11 AM

## 2015-10-21 NOTE — Progress Notes (Signed)
[facility] called for home oxygen and HHC needs as requested; Aneta Mins [phone]

## 2015-10-21 NOTE — Progress Notes (Signed)
SATURATION QUALIFICATIONS: (This note is used to comply with regulatory documentation for home oxygen)  Patient Saturations on Room Air at Rest =76%  Patient Saturations on Room Air while Ambulating not done  Patient Saturations on 2 Liters of oxygen while Ambulating not done Please briefly explain why patient needs home oxygen: Pt oxygen saturation while sitting on the  bed  On room air had ranged 76% to 86%. Pt placed back on O2 at 2 liters. O2 saturation increased to 94-98 %.

## 2015-10-21 NOTE — Discharge Summary (Signed)
Physician Discharge Summary  [name] [id number] DOB: 10-02-1917 DOA: 10/18/2015  PCP: Criselda Peaches, MD  Admit date: 10/18/2015 Discharge date: 10/21/2015  Time spent: 35 minutes  Recommendations for Outpatient Follow-up:  1. Please follow-up on volume status, he was admitted for acute CHF, on discharge his Lasix was increased to 80 mg in a.m., 40 mg in p.m. as recommended by cardiology. Dry weight 180 lbs.  2. Please follow-up on BMP, on 10/21/2015 had a creatinine of 1.7. During this hospitalization he received IV Lasix for acute CHF. 3. He was set up with home health services for physical therapy and occupational therapy prior to discharge   Discharge Diagnoses:  Active Problems:   Left bundle branch block   CAD (coronary artery disease)   Dyslipidemia   COPD (chronic obstructive pulmonary disease) (HCC)   CKD (chronic kidney disease) stage 4, GFR 15-29 ml/min (HCC)   Cardiomyopathy, EF by echo 25-30%   Pacemaker   Chronic systolic CHF (congestive heart failure) (HCC)   HX: anticoagulation   Acute hypoxemic respiratory failure ([place])   Fall   Acute on chronic systolic CHF (congestive heart failure) ([place])   Discharge Condition: Stable  Diet recommendation: Low sodium, fluid restricted, heart healthy  Filed Weights   10/19/15 0553 10/20/15 0301 10/21/15 0524  Weight: 80.922 kg (178 lb 6.4 oz) 82.01 kg (180 lb 12.8 oz) 82.328 kg (181 lb 8 oz)    History of present illness:  Levi Lewis is a 82 y.o. male with medical history significant for PMP 06/2013 for Afib/ BBB, on Eliquis,CAD, hypertension, gout, dyslipidemia, known lung nodules, COPD, presenting to the emergency department today, for evaluation of injury from fall to his left chest Region. He was able to get up right away, with minimal assistance. However, he did hurt the left rib-area, as well as the left frontoparietal region. He denies any dizziness or vertigo with the symptoms. No LOC or confusion. No  unilateral weakness. No seizure. He has been nauseated for several days, and had decreased appetite. He denies any vomiting, diarrhea, blood in the stools, food poisoning.He denies any abdominal pain. He denies any dysuria or gross hematuria. Denies worsening incontinence. The patient has had a cough for several days, and has had sick contact, as his wife was "fighting a cold ".he denies any pleuritic chest pain. He also has been more short of breath, due to "fluid in the lungs " and has been taking increased amounts of diuretic for several days, just resuming his regular dose as of yesterday. He denies any leg swelling. He denies any cardiac complaints, no palpitations. Patient denies any prior history of PE or DVT. No recent long distance trips, or change in medications.  Hospital Course:  [name] is a 82 year old gentleman with a past medical history of systolic congestive heart failure, last transthoracic echocardiogram performed in 2015 showing EF of 45%, admitted to the medicine service on 10/18/2015 when he presented with generalized weakness having a fall at home. He was found to be in acute hypoxemic respiratory failure thought to be secondary to acute decompensated congestive heart failure. Issue workup included a chest x-ray that revealed worsening interstitial and basilar alveolar opacities consistent with pulmonary edema. He was started on IV Lasix. Cardiology was consulted. He showed gradual clinical improvement over the course of this hospitalization and by 10/21/2015 appeared euvolemic. Cardiology recommended increasing his home Lasix dose to 80 mg in a.m. and 40 mg in p.m. (previously had been on 40 mg  in a.m. and 20 mg in p.m.). Labs otherwise remained stable. On day of discharge he had a creatinine of 1.7. He has a history of chronic kidney disease with baseline creatinine near 1.6. He was discharged was home with home health services on  10/21/2015.   Consultations:  Cardiology  Discharge Exam: Filed Vitals:   10/20/15 2017 10/21/15 0524  BP: 97/49 105/59  Pulse: 73 74  Temp: 98.2 F (36.8 C) 98.2 F (36.8 C)  Resp: 19 20     General: Frail, alert, NAD  Cardiovascular: pace rhythm  Respiratory: Interim improvement to lung exam  Abdomen: Soft/ND/NT, positive BS  Musculoskeletal: No Edema  Neuro: aaox3  Discharge Instructions   Discharge Instructions    (HEART FAILURE PATIENTS) Call MD:  Anytime you have any of the following symptoms: 1) 3 pound weight gain in 24 hours or 5 pounds in 1 week 2) shortness of breath, with or without a dry hacking cough 3) swelling in the hands, feet or stomach 4) if you have to sleep on extra pillows at night in order to breathe.    Complete by:  As directed      Call MD for:  difficulty breathing, headache or visual disturbances    Complete by:  As directed      Call MD for:  difficulty breathing, headache or visual disturbances    Complete by:  As directed      Call MD for:  extreme fatigue    Complete by:  As directed      Call MD for:  extreme fatigue    Complete by:  As directed      Call MD for:  hives    Complete by:  As directed      Call MD for:  hives    Complete by:  As directed      Call MD for:  persistant dizziness or light-headedness    Complete by:  As directed      Call MD for:  persistant dizziness or light-headedness    Complete by:  As directed      Call MD for:  persistant nausea and vomiting    Complete by:  As directed      Call MD for:  persistant nausea and vomiting    Complete by:  As directed      Call MD for:  redness, tenderness, or signs of infection (pain, swelling, redness, odor or green/yellow discharge around incision site)    Complete by:  As directed      Call MD for:  redness, tenderness, or signs of infection (pain, swelling, redness, odor or green/yellow discharge around incision site)    Complete by:  As directed       Call MD for:  severe uncontrolled pain    Complete by:  As directed      Call MD for:  severe uncontrolled pain    Complete by:  As directed      Call MD for:  temperature >100.4    Complete by:  As directed      Call MD for:  temperature >100.4    Complete by:  As directed      Call MD for:    Complete by:  As directed      Call MD for:    Complete by:  As directed      Diet - low sodium heart healthy    Complete by:  As directed  Diet - low sodium heart healthy    Complete by:  As directed      Increase activity slowly    Complete by:  As directed      Increase activity slowly    Complete by:  As directed           Current Discharge Medication List    CONTINUE these medications which have CHANGED   Details  !! furosemide (LASIX) 40 MG tablet Take 1 tablet (40 mg total) by mouth daily at 6 PM. Qty: 30 tablet, Refills: 1    !! furosemide (LASIX) 80 MG tablet Take 1 tablet (80 mg total) by mouth daily. Qty: 30 tablet, Refills: 1     !! - Potential duplicate medications found. Please discuss with provider.    CONTINUE these medications which have NOT CHANGED   Details  carvedilol (COREG) 3.125 MG tablet Take 3.125 mg by mouth at bedtime. Take one tablet (3.125 mg) by mouth every evening    ELIQUIS 2.5 MG TABS tablet TAKE 1 TABLET (2.5 MG TOTAL) BY MOUTH 2 (TWO) TIMES DAILY. Qty: 180 tablet, Refills: 2    fenofibrate (TRICOR) 48 MG tablet TAKE 1/2 TABLET BY MOUTH DAILY Qty: 15 tablet, Refills: 5    fish oil-omega-3 fatty acids 1000 MG capsule Take 2 g by mouth daily.     gabapentin (NEURONTIN) 300 MG capsule Take 300 mg by mouth 2 (two) times daily. Refills: 12    guaiFENesin (MUCINEX) 600 MG 12 hr tablet Take 600 mg by mouth 2 (two) times daily as needed.    levothyroxine (SYNTHROID, LEVOTHROID) 75 MCG tablet Take 1 tablet (75 mcg total) by mouth daily. Qty: 30 tablet, Refills: 6    Multiple Vitamins-Minerals (OCUVITE PO) Take 2 tablets by mouth daily.      omeprazole (PRILOSEC) 20 MG capsule Take 20 mg by mouth daily.     predniSONE (DELTASONE) 10 MG tablet Take 5 mg by mouth daily. Refills: 12    sodium chloride (OCEAN) 0.65 % SOLN nasal spray Place 1 spray into both nostrils daily as needed for congestion.     tamsulosin (FLOMAX) 0.4 MG CAPS capsule Take 0.4 mg by mouth 2 (two) times daily.     triazolam (HALCION) 0.25 MG tablet Take 0.5 tablets (0.125 mg total) by mouth at bedtime as needed for sleep. Qty: 15 tablet, Refills: 0       No Known Allergies Follow-up Information    Follow up with GREEN, EDWIN JAY, MD In 1 week.   Specialty:  Internal Medicine   Contact information:   [address] [place] [address] [place] [address] [phone]       Follow up with Dola Argyle, MD In 1 week.   Specialty:  Cardiology   Contact information:   [id number] [place] [address] [phone]        The results of significant diagnostics from this hospitalization (including imaging, microbiology, ancillary and laboratory) are listed below for reference.    Significant Diagnostic Studies: Dg Chest 2 View  10/18/2015  CLINICAL DATA:  Respiratory distress. EXAM: CHEST  2 VIEW COMPARISON:  10/18/2015, earlier the same day. FINDINGS: Since the previous study there has been increase in vascular congestion with worsening interstitial opacity suggesting evolving pulmonary edema. The cardio pericardial silhouette is enlarged. Left-sided permanent pacemaker remains in place. Degenerative changes noted in the shoulders. IMPRESSION: Worsening interstitial and basilar alveolar opacities suggest progressing pulmonary edema. Electronically Signed   By: Randall Hiss  Tery Sanfilippo M.D.   On: 10/18/2015 15:24   Dg Ribs Unilateral W/chest Left  10/18/2015  CLINICAL DATA:  Recent fall with left-sided chest pain, initial encounter EXAM: LEFT RIBS AND CHEST - 3+ VIEW COMPARISON:  09/21/2015 FINDINGS: Cardiac shadow is stable. Pacing device is  again noted. Diffuse interstitial changes are again seen and stable. No acute rib fracture is noted. Degenerative changes of the thoracic spine are seen. IMPRESSION: No acute rib fracture noted. Electronically Signed   By: Inez Catalina M.D.   On: 10/18/2015 09:06   Ct Head Wo Contrast  10/18/2015  CLINICAL DATA:  Fall in bathroom this morning. Hit left side of head. Pain. Initial encounter. EXAM: CT HEAD WITHOUT CONTRAST CT CERVICAL SPINE WITHOUT CONTRAST TECHNIQUE: Multidetector CT imaging of the head and cervical spine was performed following the standard protocol without intravenous contrast. Multiplanar CT image reconstructions of the cervical spine were also generated. COMPARISON:  06/15/2014 FINDINGS: CT HEAD FINDINGS There is no evidence of intracranial hemorrhage, brain edema, or other signs of acute infarction. There is no evidence of intracranial mass lesion or mass effect. No abnormal extraaxial fluid collections are identified. Moderate diffuse cerebral atrophy and mild chronic small vessel disease are stable in appearance. Ventricles are stable in size. No evidence of skull fracture. CT CERVICAL SPINE FINDINGS No evidence of acute cervical spine fracture. Multilevel degenerative disc disease is seen which is most severe at C5-6 and C6-7. Mild to moderate facet DJD is also seen bilaterally at most cervical levels. Mild anterolisthesis at C4-5 measuring 3 mm is attributable to degenerative changes seen at this level, and appears stable compared to prior exam. IMPRESSION: No acute intracranial abnormality. Stable cerebral atrophy and chronic small vessel disease. No evidence of acute cervical spine fracture. Advanced degenerative spondylosis, as described above. Electronically Signed   By: Earle Gell M.D.   On: 10/18/2015 09:34   Ct Cervical Spine Wo Contrast  10/18/2015  CLINICAL DATA:  Fall in bathroom this morning. Hit left side of head. Pain. Initial encounter. EXAM: CT HEAD WITHOUT CONTRAST CT  CERVICAL SPINE WITHOUT CONTRAST TECHNIQUE: Multidetector CT imaging of the head and cervical spine was performed following the standard protocol without intravenous contrast. Multiplanar CT image reconstructions of the cervical spine were also generated. COMPARISON:  06/15/2014 FINDINGS: CT HEAD FINDINGS There is no evidence of intracranial hemorrhage, brain edema, or other signs of acute infarction. There is no evidence of intracranial mass lesion or mass effect. No abnormal extraaxial fluid collections are identified. Moderate diffuse cerebral atrophy and mild chronic small vessel disease are stable in appearance. Ventricles are stable in size. No evidence of skull fracture. CT CERVICAL SPINE FINDINGS No evidence of acute cervical spine fracture. Multilevel degenerative disc disease is seen which is most severe at C5-6 and C6-7. Mild to moderate facet DJD is also seen bilaterally at most cervical levels. Mild anterolisthesis at C4-5 measuring 3 mm is attributable to degenerative changes seen at this level, and appears stable compared to prior exam. IMPRESSION: No acute intracranial abnormality. Stable cerebral atrophy and chronic small vessel disease. No evidence of acute cervical spine fracture. Advanced degenerative spondylosis, as described above. Electronically Signed   By: Earle Gell M.D.   On: 10/18/2015 09:34   Nm Pulmonary Perf And Vent  10/18/2015  CLINICAL DATA:  Shortness of breath. Fall earlier today. Nonproductive cough. EXAM: NUCLEAR MEDICINE VENTILATION - PERFUSION LUNG SCAN TECHNIQUE: Ventilation images were obtained in multiple projections using inhaled aerosol Tc-60m DTPA. Perfusion images were obtained  in multiple projections after intravenous injection of Tc-61m MAA. RADIOPHARMACEUTICALS:  32.7 mCi Technetium-38m DTPA aerosol inhalation and 4.06 mCi Technetium-37m MAA IV COMPARISON:  Chest x-ray today. FINDINGS: Ventilation: Linear nonsegmental defect over the right mid to lower lung.  Heterogeneous uptake over the right hilar region. New nonsegmental defect over the posterior left lower lobe. Perfusion: Linear nonsegmental defect over the right mid to lower lung. Heterogeneous uptake over the right hilar region. Linear nonsegmental defect over the posterior left lower lobe. No significant peripheral wedge-shaped mismatched defects. IMPRESSION: Findings compatible a low probability for pulmonary embolism. Electronically Signed   By: Marin Olp M.D.   On: 10/18/2015 14:54    Microbiology: No results found for this or any previous visit (from the past 240 hour(s)).   Labs: Basic Metabolic Panel:  Recent Labs Lab 10/18/15 0822 10/19/15 0319 10/20/15 0357 10/21/15 0352  NA 141 142 138 139  K 4.1 4.0 4.3 4.3  CL 112* 101 106 104  CO2 21* 23 24 25   GLUCOSE 121* 108* 107* 112*  BUN 24* 23* 27* 28*  CREATININE 1.95* 1.65* 1.64* 1.73*  CALCIUM 9.0 9.4 8.3* 8.4*  MG  --   --  2.2 2.1   Liver Function Tests:  Recent Labs Lab 10/19/15 0319  AST 15  ALT 11*  ALKPHOS 40  BILITOT 1.0  PROT 5.5*  ALBUMIN 2.8*   No results for input(s): LIPASE, AMYLASE in the last 168 hours. No results for input(s): AMMONIA in the last 168 hours. CBC:  Recent Labs Lab 10/18/15 0822 10/19/15 0319  WBC 10.2 7.7  NEUTROABS 7.2  --   HGB 12.5* 11.8*  HCT 38.3* 36.9*  MCV 96.0 96.6  PLT 247 224   Cardiac Enzymes: No results for input(s): CKTOTAL, CKMB, CKMBINDEX, TROPONINI in the last 168 hours. BNP: BNP (last 3 results)  Recent Labs  10/18/15 0822  BNP 721.7*    ProBNP (last 3 results) No results for input(s): PROBNP in the last 8760 hours.  CBG: No results for input(s): GLUCAP in the last 168 hours.     Signed:  Kelvin Cellar MD.  [facility] 10/21/2015, 10:52 AM

## 2015-10-28 ENCOUNTER — Other Ambulatory Visit: Payer: Self-pay | Admitting: *Deleted

## 2015-10-28 ENCOUNTER — Encounter: Payer: Self-pay | Admitting: Physician Assistant

## 2015-10-28 ENCOUNTER — Ambulatory Visit (INDEPENDENT_AMBULATORY_CARE_PROVIDER_SITE_OTHER): Payer: Medicare Other | Admitting: Physician Assistant

## 2015-10-28 VITALS — BP 110/60 | HR 73 | Ht 70.0 in | Wt 181.6 lb

## 2015-10-28 DIAGNOSIS — R0600 Dyspnea, unspecified: Secondary | ICD-10-CM

## 2015-10-28 DIAGNOSIS — I429 Cardiomyopathy, unspecified: Secondary | ICD-10-CM

## 2015-10-28 DIAGNOSIS — I480 Paroxysmal atrial fibrillation: Secondary | ICD-10-CM

## 2015-10-28 DIAGNOSIS — I10 Essential (primary) hypertension: Secondary | ICD-10-CM

## 2015-10-28 DIAGNOSIS — E785 Hyperlipidemia, unspecified: Secondary | ICD-10-CM

## 2015-10-28 DIAGNOSIS — Z950 Presence of cardiac pacemaker: Secondary | ICD-10-CM

## 2015-10-28 DIAGNOSIS — I5022 Chronic systolic (congestive) heart failure: Secondary | ICD-10-CM

## 2015-10-28 DIAGNOSIS — I495 Sick sinus syndrome: Secondary | ICD-10-CM

## 2015-10-28 NOTE — Patient Instructions (Addendum)
Medication Instructions:  Your physician recommends that you continue on your current medications as directed. Please refer to the Current Medication list given to you today.   Labwork: TODAY:  BMET  Testing/Procedures: None ordered  Follow-Up: Your physician recommends that you schedule a follow-up appointment in: SEE DR. Marlou Porch AS PLANNED   Any Other Special Instructions Will Be Listed Below (If Applicable).     If you need a refill on your cardiac medications before your next appointment, please call your pharmacy.

## 2015-10-28 NOTE — Progress Notes (Signed)
Cardiology Office Note    Date:  10/28/2015   ID:  Levi Lewis, DOB [birth year], MRN [id number]  PCP:  Criselda Peaches, MD  Cardiologist:  Dr. Marlou Porch / Dr. Caryl Comes (formerly Dr. Ron Parker)  Chief Complaint  Patient presents with  . Hospitalization Follow-up    seen for Dr. Marlou Porch    History of Present Illness:  Levi Lewis is a 82 y.o. male with PMH of CAD (noted calcified coronaries by CT), HTN, HLD, COPD, afib on eliquis, SSS s/p St Jude CRT-P [id number], chronic systolic HF, MR and AI. His echocardiogram in 2010 showed EF 45%. However 09/04/2013, EF has been down to 25-30%. EP was consulted for alternating bundle branch block and atrial fibrillation. Eventually with presumed NICM and baseline left bundle branch block, St. Jude CRT-P device was implanted in March 2015. His last Myoview obtained on 08/05/2008 showed small focus of potential reversible ischemia within the mid and apical segment of the anteroseptal wall, septal dyskinesis, and anterior wall hypokinesis, EF 43%. Last echocardiogram obtained on 07/04/2013 showed EF 25-30%, severe anteroseptal hypokinesis, dilated aortic root 41 mm, moderate MR/TR, mild AR, peak PA pressure 48 mmHg. Patient was last seen by Dr. Caryl Comes on 09/21/2015, his device has been reprogrammed to DDDR. It was also noted patient has some dizziness, carvedilol has been reduced to once daily. Patient was most recently admitted on 7/2-7/5 after sustaining a fall in his bathroom against a sink. Prior to that, he was having some dyspnea at rest and with exertion, he increased his Lasix which seems to help with dyspnea at rest. On arrival to [facility], d-dimer was 1.43. VQ scan was negative. He was given low-dose IV Lasix with significant urinary output. Cardiology was consulted for acute on chronic systolic heart failure. His home Lasix was increased to 80 mg a.m. and 40 mg p.m, essentially doubling of the previous home dose. He was started on home O2 before his discharge.     He presents today for post hospital follow-up. He has been doing well since his discharge, he denies any significant shortness of breath. He has been using his oxygen however is thinking about how long he needs it. He does walk around with a walker and carrying oxygen tank make it very difficult. He is planning to discuss this with his PCP. I did check a resting O2 saturation after taking off his oxygen, his oxygen saturation was 96-97% off O2. However he likely will need ambulatory O2 sat walking with walker before we can safely discontinue the home oxygen. I have instructed him to use it at night and as needed when he walks for now. We will obtain BMET today, if Cr stable, will continue current dose of lasix, however if Cr worsen, will decrease lasix down to 40mg  BID.     Past Medical History  Diagnosis Date  . Left bundle branch block   . CAD (coronary artery disease)   . Hypertension   . Gout   . Dyslipidemia   . Lung nodules   . COPD (chronic obstructive pulmonary disease) ([place])   . Mitral regurgitation   . Aortic insufficiency   . Shoulder pain   . Chronic lower back pain   . Cardiomyopathy, EF by echo 25-30% 07/14/2013  . RBBB   . Atrial fibrillation ([place])   . Pacemaker   . Shortness of breath   . CHF (congestive heart failure) ([place])   . Pneumonia     "2-3 times" (10/20/2015)  .  Hypothyroidism   . GERD (gastroesophageal reflux disease)   . Arthritis     "back, knees, right leg" (10/20/2015)    Past Surgical History  Procedure Laterality Date  . Back surgery    . Shoulder arthroscopy Right   . Knee arthroscopy Right   . Tonsillectomy    . Coronary angioplasty    . Bi-ventricular pacemaker insertion (crt-p)  07-15-2013    STJ Quadra Allura CRTP implanted by Dr Caryl Comes for NICM, CHF, alternating bundle branch blocks  . Cardioversion N/A 09/13/2013    Procedure: CARDIOVERSION;  Surgeon: Carlena Bjornstad, MD;  Location[facility] ENDOSCOPY;  Service: Cardiovascular;  Laterality: N/A;  .  Cardioversion N/A 10/17/2013    Procedure: CARDIOVERSION;  Surgeon: Lelon Perla, MD;  Location: [facility] ENDOSCOPY;  Service: Cardiovascular;  Laterality: N/A;  . Permanent pacemaker insertion N/A 07/15/2013    Procedure: PERMANENT PACEMAKER INSERTION;  Surgeon: Deboraha Sprang, MD;  Location: [facility] CATH LAB;  Service: Cardiovascular;  Laterality: N/A;  . Insert / replace / remove pacemaker    . Cataract extraction w/ intraocular lens  implant, bilateral Bilateral   . Lumbar disc surgery      Current Medications: Outpatient Prescriptions Prior to Visit  Medication Sig Dispense Refill  . carvedilol (COREG) 3.125 MG tablet Take 3.125 mg by mouth at bedtime. Take one tablet (3.125 mg) by mouth every evening    . ELIQUIS 2.5 MG TABS tablet TAKE 1 TABLET (2.5 MG TOTAL) BY MOUTH 2 (TWO) TIMES DAILY. 180 tablet 2  . fenofibrate (TRICOR) 48 MG tablet TAKE 1/2 TABLET BY MOUTH DAILY 15 tablet 5  . fish oil-omega-3 fatty acids 1000 MG capsule Take 2 g by mouth daily.     . furosemide (LASIX) 40 MG tablet Take 1 tablet (40 mg total) by mouth daily at 6 PM. 30 tablet 1  . furosemide (LASIX) 80 MG tablet Take 1 tablet (80 mg total) by mouth daily. 30 tablet 1  . gabapentin (NEURONTIN) 300 MG capsule Take 300 mg by mouth 2 (two) times daily.  12  . guaiFENesin (MUCINEX) 600 MG 12 hr tablet Take 600 mg by mouth 2 (two) times daily as needed for cough or to loosen phlegm (COLDS).     Marland Kitchen levothyroxine (SYNTHROID, LEVOTHROID) 75 MCG tablet Take 1 tablet (75 mcg total) by mouth daily. 30 tablet 6  . Multiple Vitamins-Minerals (OCUVITE PO) Take 2 tablets by mouth daily.     Marland Kitchen omeprazole (PRILOSEC) 20 MG capsule Take 20 mg by mouth daily.     . predniSONE (DELTASONE) 10 MG tablet Take 5 mg by mouth daily.  12  . sodium chloride (OCEAN) 0.65 % SOLN nasal spray Place 1 spray into both nostrils daily as needed for congestion.     . tamsulosin (FLOMAX) 0.4 MG CAPS capsule Take 0.4 mg by mouth 2 (two) times daily.     .  triazolam (HALCION) 0.25 MG tablet Take 0.5 tablets (0.125 mg total) by mouth at bedtime as needed for sleep. 15 tablet 0   No facility-administered medications prior to visit.     Allergies:   Review of patient's allergies indicates no known allergies.   Social History   Social History  . Marital Status: Married    Spouse Name: N/A  . Number of Children: N/A  . Years of Education: N/A   Social History Main Topics  . Smoking status: Never Smoker   . Smokeless tobacco: Never Used  . Alcohol Use: No  . Drug  Use: No  . Sexual Activity: Not Currently   Other Topics Concern  . None   Social History Narrative   He has never smoked or drank. No illcit drug use . He is retired from the [employer] and lives with his wife. Illicit Drug Use- no     Family History:  The patient's family history includes Heart attack in his sister; Lung cancer in his brother. There is no history of Colon cancer.   ROS:   Please see the history of present illness.    ROS All other systems reviewed and are negative.   PHYSICAL EXAM:   VS:  BP 110/60 mmHg  Pulse 73  Ht 5\' 10"  (1.778 m)  Wt 181 lb 9.6 oz (82.373 kg)  BMI 26.06 kg/m2  SpO2 97%   GEN: Well nourished, well developed, in no acute distressSitting in wheelchair HEENT: normal Neck: no JVD, carotid bruits, or masses Cardiac: RRR; no murmurs, rubs, or gallops,no edema  Respiratory:  clear to auscultation bilaterally, normal work of breathing GI: soft, nontender, nondistended, + BS MS: no deformity or atrophy Skin: warm and dry, no rash Neuro:  Alert and Oriented x 3, Strength and sensation are intact Psych: euthymic mood, full affect  Wt Readings from Last 3 Encounters:  10/28/15 181 lb 9.6 oz (82.373 kg)  10/21/15 181 lb 8 oz (82.328 kg)  09/21/15 181 lb 3.2 oz (82.192 kg)      Studies/Labs Reviewed:   EKG:  EKG is not ordered today.    Recent Labs: 06/22/2015: TSH 1.18 10/18/2015: B Natriuretic Peptide 721.7* 10/19/2015: ALT  11*; Hemoglobin 11.8*; Platelets 224 10/21/2015: BUN 28*; Creatinine, Ser 1.73*; Magnesium 2.1; Potassium 4.3; Sodium 139   Lipid Panel    Component Value Date/Time   CHOL 166 09/17/2010 1032   TRIG 224.0* 09/17/2010 1032   HDL 37.40* 09/17/2010 1032   CHOLHDL 4 09/17/2010 1032   VLDL 44.8* 09/17/2010 1032   LDLCALC * 08/07/2008 0500    113        Total Cholesterol/HDL:CHD Risk Coronary Heart Disease Risk Table                     Men   Women  1/2 Average Risk   3.4   3.3  Average Risk       5.0   4.4  2 X Average Risk   9.6   7.1  3 X Average Risk  23.4   11.0        Use the calculated Patient Ratio above and the CHD Risk Table to determine the patient's CHD Risk.        ATP III CLASSIFICATION (LDL):  <100     mg/dL   Optimal  100-129  mg/dL   Near or Above                    Optimal  130-159  mg/dL   Borderline  160-189  mg/dL   High  >190     mg/dL   Very High   LDLDIRECT 109.6 09/17/2010 1032    Additional studies/ records that were reviewed today include:   Myoview 08/08/2008 IMPRESSION:  1. Small focus of potential reversible ischemia within the mid and apical segment of the anteroseptal wall. Of note, artifactual septal perfusion defects may be noted with left bundle branch block. 2. Septal dyskinesis and anterior wall hypokinesis. Dyskinesis may in part related to the left bundle branch block.  3. Left ventricular  ejection fraction equal 43%.   Echo 07/04/2013 LV EF: 25% -  30%  ------------------------------------------------------------ Indications:   CAD of native vessels 414.01. LBBB 426.3.  ------------------------------------------------------------ History:  PMH: Acquired from the patient and from the patient's chart. Palpitations, dyspnea, and murmur. Chronic obstructive pulmonary disease. Risk factors: Hypertension. Dyslipidemia. LBBB.  ------------------------------------------------------------ Study Conclusions  - Left  ventricle: Systolic function was severely reduced. The estimated ejection fraction was in the range of 25% to 30%. When compared to 2010, EF is reduced (45% prior). There is severe hypokinesis of the anteroseptal myocardium. Doppler parameters are consistent with high ventricular filling pressure. - Aortic valve: Mild regurgitation. - Aorta: Aortic root dimension: 38mm (ED). - Mitral valve: Moderate regurgitation. - Left atrium: The atrium was mildly dilated. - Right atrium: The atrium was mildly dilated. - Atrial septum: No defect or patent foramen ovale was identified. - Tricuspid valve: Moderate regurgitation. - Pulmonary arteries: PA peak pressure: 64mm Hg (S). - Pericardium, extracardiac: There was a left pleural effusion.   ASSESSMENT:    1. Chronic systolic CHF (congestive heart failure) (HCC)   2. Paroxysmal atrial fibrillation ([place])   3. Dyspnea   4. Cardiomyopathy, EF by echo 25-30%   5. Essential hypertension   6. SSS (sick sinus syndrome) (HCC)   7. Cardiac resynchronization therapy pacemaker (CRT-P) in place   8. Hyperlipidemia      PLAN:  In order of problems listed above:  1.  Chronic systolic heart failure: lasix increased to 80mg  in AM and 40mg  in PM. Appears to be euvolemic on exam, will continue current dose, recheck BMET today, if stable, will continue current dose. If worsen, will cut back down to 40mg  BID  2. Paroxysmal afib on eliquis  - CHA2DS2-Vasc score 5 (HTN, age, HF, CAD), on Coreg. Last EKG in the hospital was paced.   3. SSS s/p St Jude CRT-P 3/15: managed by Dr. Caryl Comes  4. HTN: continue current medication, BP well controlled on coreg, may consider ARB and spironolactone once BP improve  5. HLD: on fenofibrate, consider fasting lipid on next visit   Medication Adjustments/Labs and Tests Ordered: Current medicines are reviewed at length with the patient today.  Concerns regarding medicines are outlined above.  Medication  changes, Labs and Tests ordered today are listed in the Patient Instructions below. Patient Instructions  Medication Instructions:  Your physician recommends that you continue on your current medications as directed. Please refer to the Current Medication list given to you today.   Labwork: TODAY:  BMET  Testing/Procedures: None ordered  Follow-Up: Your physician recommends that you schedule a follow-up appointment in: SEE DR. Marlou Porch AS PLANNED   Any Other Special Instructions Will Be Listed Below (If Applicable).     If you need a refill on your cardiac medications before your next appointment, please call your pharmacy.       Hilbert Corrigan, [place]  10/28/2015 4:25 PM    [facility] [place], [place], [place]  [address] Phone: [phone]; Fax: [phone]

## 2015-10-28 NOTE — Patient Outreach (Signed)
[facility]) Care Management  10/28/2015  DEUNDRA BARD Oct 28, 1917 [id number]  EMMI-Heart Failure dashboard referral: New/worsening problems/new swelling -patient/caregiver answered yes on 7'/02/2016.   Telephone call to patient who was advised of reason for call. Patient gave daughter-in-law permission to take call for him. Daughter-in-law was advised of reason for call & of [facility] care management services.  HIPPA verification received from daughter-in-law. States patient had less swelling today than yesterday and that he had follow up appointment with cardiologist today-Dr. Marlou Porch.  States primary care provider appointment is scheduled for Friday-10/30/2015.  Family members are providing transportation for patient to appointments.   Daughter-in-law voices that patient does not need any further services because [facility] is providing home services of RN, physical and occupational therapist. She was advised of differences in [facility] and home health services.   States patient has 24/7 assistance from family members. States patient's grand daughter is Insurance claims handler and is assisting patient as needed. States patient is using walker currently and has had no falls since discharged from hospital.  States he is knowledgeable of medications and is taking medications as ordered by MD. Durward Fortes of importance of medication adherence. Voices that patient understands & takes as instructed by MD consistently.   States currently patient is on oxygen 24/7 but will be weaned off . States patient has been checking oxygen levels daily & will report to doctor. States patient is also aware of heart failure action plan and knows when to call MD office for weight gain.   Daughter-in-law voices that patient is currently getting all of his healthcare needs met at this time and does not need [facility] case management services -community or telephonic. States he is getting numerous calls throughout the  day & does not need anymore.  Daughter-in-law is declining [facility] literature & magnet.  She accepted RN case manager contact information to use if needed.   EMMI-HF dashboard follow up completed.  Plan:  Send to care management assistant to close out.  Sherrin Daisy, RN BSN [place] Management Coordinator [facility] Care Management  [phone]

## 2015-10-29 LAB — BASIC METABOLIC PANEL
BUN: 38 mg/dL — AB (ref 7–25)
CO2: 26 mmol/L (ref 20–31)
Calcium: 8.7 mg/dL (ref 8.6–10.3)
Chloride: 104 mmol/L (ref 98–110)
Creat: 1.59 mg/dL — ABNORMAL HIGH (ref 0.70–1.11)
GLUCOSE: 157 mg/dL — AB (ref 65–99)
POTASSIUM: 4.7 mmol/L (ref 3.5–5.3)
Sodium: 143 mmol/L (ref 135–146)

## 2015-10-30 ENCOUNTER — Encounter: Payer: Self-pay | Admitting: Internal Medicine

## 2015-10-30 ENCOUNTER — Telehealth: Payer: Self-pay | Admitting: Internal Medicine

## 2015-10-30 NOTE — Telephone Encounter (Signed)
New MEssage  Pt dtr calling to speak w/ rN- wanted to discuss a medical alert bracelet- possible interference w/ device. Please call back and discuss.

## 2015-10-30 NOTE — Telephone Encounter (Signed)
See My Chart message for documentation

## 2015-12-03 ENCOUNTER — Encounter (HOSPITAL_COMMUNITY): Payer: Self-pay | Admitting: Emergency Medicine

## 2015-12-03 ENCOUNTER — Ambulatory Visit (INDEPENDENT_AMBULATORY_CARE_PROVIDER_SITE_OTHER): Payer: Medicare Other

## 2015-12-03 ENCOUNTER — Ambulatory Visit (HOSPITAL_COMMUNITY)
Admission: EM | Admit: 2015-12-03 | Discharge: 2015-12-03 | Disposition: A | Discharge status: Home / Self Care | Payer: Medicare Other | Attending: Family Medicine | Admitting: Family Medicine

## 2015-12-03 DIAGNOSIS — I5022 Chronic systolic (congestive) heart failure: Secondary | ICD-10-CM

## 2015-12-03 DIAGNOSIS — R0989 Other specified symptoms and signs involving the circulatory and respiratory systems: Secondary | ICD-10-CM

## 2015-12-03 LAB — POCT I-STAT, CHEM 8
BUN: 36 mg/dL — AB (ref 6–20)
Calcium, Ion: 1.15 mmol/L (ref 1.12–1.23)
Chloride: 106 mmol/L (ref 101–111)
Creatinine, Ser: 1.7 mg/dL — ABNORMAL HIGH (ref 0.61–1.24)
Glucose, Bld: 107 mg/dL — ABNORMAL HIGH (ref 65–99)
HEMATOCRIT: 40 % (ref 39.0–52.0)
HEMOGLOBIN: 13.6 g/dL (ref 13.0–17.0)
POTASSIUM: 3.9 mmol/L (ref 3.5–5.1)
SODIUM: 144 mmol/L (ref 135–145)
TCO2: 26 mmol/L (ref 0–100)

## 2015-12-03 NOTE — ED Triage Notes (Signed)
Patient reports physical therapist came out today and on assessment noted patient had bilateral crackles.  Patient admits to slightly more sob, no ankle edema.  Skin warm and dry.  Denies any chest pain

## 2015-12-03 NOTE — ED Provider Notes (Signed)
CSN: [id number]     Arrival date & time 12/03/15  1030 History   First MD Initiated Contact with Patient 12/03/15 1127     Chief Complaint  Patient presents with  . Shortness of Breath   (Consider location/radiation/quality/duration/timing/severity/associated sxs/prior Treatment) Pleasant 82 year old male presents to the urgent care on the advice of his physical therapist. The physical therapist heard some crackles in his lungs during a visit today and suggested he go to the urgent care. The patient states he has chronic shortness of breath and states it might be a little bit worse but somewhat ambiguous. He denies orthopnea. He has chronic exertional dyspnea and has not changed. He denies chest pain, fever, chills, upper respiratory symptoms.  Medical problems include aortic insufficiency, atrial fibrillation, CAD, cardiomyopathy with low EF of 25-30%, CHF, COPD, mitral regurg, pacemaker, hypertension, chronic back pain.      Past Medical History:  Diagnosis Date  . Aortic insufficiency   . Arthritis    "back, knees, right leg" (10/20/2015)  . Atrial fibrillation ([place])   . CAD (coronary artery disease)   . Cardiomyopathy, EF by echo 25-30% 07/14/2013  . CHF (congestive heart failure) ([place])   . Chronic lower back pain   . COPD (chronic obstructive pulmonary disease) ([place])   . Dyslipidemia   . GERD (gastroesophageal reflux disease)   . Gout   . Hypertension   . Hypothyroidism   . Left bundle branch block   . Lung nodules   . Mitral regurgitation   . Pacemaker   . Pneumonia    "2-3 times" (10/20/2015)  . RBBB   . Shortness of breath   . Shoulder pain    Past Surgical History:  Procedure Laterality Date  . BACK SURGERY    . BI-VENTRICULAR PACEMAKER INSERTION (CRT-P)  07-15-2013   STJ Quadra Allura CRTP implanted by Dr Caryl Comes for NICM, CHF, alternating bundle branch blocks  . CARDIOVERSION N/A 09/13/2013   Procedure: CARDIOVERSION;  Surgeon: Carlena Bjornstad, MD;  Location: [facility]  ENDOSCOPY;  Service: Cardiovascular;  Laterality: N/A;  . CARDIOVERSION N/A 10/17/2013   Procedure: CARDIOVERSION;  Surgeon: Lelon Perla, MD;  Location: [facility] ENDOSCOPY;  Service: Cardiovascular;  Laterality: N/A;  . CATARACT EXTRACTION W/ INTRAOCULAR LENS  IMPLANT, BILATERAL Bilateral   . CORONARY ANGIOPLASTY    . INSERT / REPLACE / REMOVE PACEMAKER    . KNEE ARTHROSCOPY Right   . LUMBAR DISC SURGERY    . PERMANENT PACEMAKER INSERTION N/A 07/15/2013   Procedure: PERMANENT PACEMAKER INSERTION;  Surgeon: Deboraha Sprang, MD;  Location: [facility] CATH LAB;  Service: Cardiovascular;  Laterality: N/A;  . SHOULDER ARTHROSCOPY Right   . TONSILLECTOMY     Family History  Problem Relation Age of Onset  . Heart attack Sister   . Lung cancer Brother     smoker  . Colon cancer Neg Hx    Social History  Substance Use Topics  . Smoking status: Never Smoker  . Smokeless tobacco: Never Used  . Alcohol use No    Review of Systems  Constitutional: Negative for activity change, fatigue and fever.  HENT: Negative.   Eyes: Negative.   Respiratory: Positive for shortness of breath. Negative for chest tightness and wheezing.        Patient states he shortness of breath is mild and only minimally increased over his baseline.  Cardiovascular: Negative for chest pain and leg swelling.  Gastrointestinal: Negative.   Genitourinary: Negative.   Musculoskeletal: Negative.   Neurological: Negative.  All other systems reviewed and are negative.   Allergies  Review of patient's allergies indicates no known allergies.  Home Medications   Prior to Admission medications   Medication Sig Start Date End Date Taking? Authorizing Provider  carvedilol (COREG) 3.125 MG tablet Take 3.125 mg by mouth at bedtime. Take one tablet (3.125 mg) by mouth every evening    Deboraha Sprang, MD  ELIQUIS 2.5 MG TABS tablet TAKE 1 TABLET (2.5 MG TOTAL) BY MOUTH 2 (TWO) TIMES DAILY. 10/05/15   Deboraha Sprang, MD  fenofibrate  (TRICOR) 48 MG tablet TAKE 1/2 TABLET BY MOUTH DAILY 07/01/15   Jerline Pain, MD  fish oil-omega-3 fatty acids 1000 MG capsule Take 2 g by mouth daily.     Historical Provider, MD  furosemide (LASIX) 40 MG tablet Take 1 tablet (40 mg total) by mouth daily at 6 PM. 10/21/15   Kelvin Cellar, MD  furosemide (LASIX) 80 MG tablet Take 1 tablet (80 mg total) by mouth daily. 10/22/15   Kelvin Cellar, MD  gabapentin (NEURONTIN) 300 MG capsule Take 300 mg by mouth 2 (two) times daily. 09/09/15   Historical Provider, MD  guaiFENesin (MUCINEX) 600 MG 12 hr tablet Take 600 mg by mouth 2 (two) times daily as needed for cough or to loosen phlegm (COLDS).     Historical Provider, MD  levothyroxine (SYNTHROID, LEVOTHROID) 75 MCG tablet Take 1 tablet (75 mcg total) by mouth daily. 04/21/14   Carlena Bjornstad, MD  Multiple Vitamins-Minerals (OCUVITE PO) Take 2 tablets by mouth daily.     Historical Provider, MD  omeprazole (PRILOSEC) 20 MG capsule Take 20 mg by mouth daily.     Historical Provider, MD  predniSONE (DELTASONE) 10 MG tablet Take 5 mg by mouth daily. 09/04/15   Historical Provider, MD  sodium chloride (OCEAN) 0.65 % SOLN nasal spray Place 1 spray into both nostrils daily as needed for congestion.     Historical Provider, MD  tamsulosin (FLOMAX) 0.4 MG CAPS capsule Take 0.4 mg by mouth 2 (two) times daily.     Historical Provider, MD  triazolam (HALCION) 0.25 MG tablet Take 0.5 tablets (0.125 mg total) by mouth at bedtime as needed for sleep. 10/24/13   Delfina Redwood, MD   Meds Ordered and Administered this Visit  Medications - No data to display  BP 126/84 (BP Location: Right Arm)   Pulse 116   Temp 98 F (36.7 C) (Oral)   Resp 26 Comment: after walking from lobby  SpO2 95% Comment: after resting No data found.   Physical Exam  Constitutional: He is oriented to person, place, and time. He appears well-nourished. No distress.  HENT:  Head: Normocephalic and atraumatic.  Mouth/Throat: Oropharynx  is clear and moist.  Eyes: EOM are normal.  Neck: Normal range of motion.  Cardiovascular:  Irregularly irregular rhythm, tachycardia at 118.  Pulmonary/Chest: Effort normal. No respiratory distress.  Bibasilar crackles. Crackles in the left line extending to mid field.  Abdominal: Soft. Bowel sounds are normal. He exhibits no mass. There is no tenderness. There is no rebound.  Mild abdominal distention. No fluid wave, soft and easily compressible with deep palpation  Neurological: He is alert and oriented to person, place, and time.  Skin: Skin is warm and dry. He is not diaphoretic.  Psychiatric: He has a normal mood and affect.  Nursing note and vitals reviewed.   Urgent Care Course   Clinical Course    Procedures (including critical care  time)  Labs Review Labs Reviewed  POCT I-STAT, CHEM 8 - Abnormal; Notable for the following:       Result Value   BUN 36 (*)    Creatinine, Ser 1.70 (*)    Glucose, Bld 107 (*)    All other components within normal limits    Imaging Review Dg Chest 2 View  Result Date: 12/03/2015 CLINICAL DATA:  Bilateral all lung crackles, history of congestive heart failure EXAM: CHEST  2 VIEW COMPARISON:  10/18/2015 FINDINGS: Cardiomediastinal silhouette is stable. 3 leads cardiac pacemaker is unchanged in position. Mild interstitial prominence bilateral without convincing pulmonary edema. Stable scarring right midlung peripheral. No infiltrate or pulmonary edema. Mild basilar atelectasis or scarring. Mild degenerative changes mid and lower thoracic spine. IMPRESSION: No active disease. Mild interstitial prominence bilaterally without convincing pulmonary edema. Stable scarring in right midlung. Three leads cardiac pacemaker is unchanged in position. Mild basilar atelectasis or scarring. Electronically Signed   By: Lahoma Crocker M.D.   On: 12/03/2015 12:13     Visual Acuity Review  Right Eye Distance:   Left Eye Distance:   Bilateral Distance:     Right Eye Near:   Left Eye Near:    Bilateral Near:         MDM   1. Chest rales   2. Chronic systolic congestive heart failure ([place])    Patient instructed to follow-up with cardiologist early next week, call for appointment today. Spoke with Dr.  Marland KitchenCamis"? Partner at low by our cardiology. He agreed that the patient should be able to go home at this time and follow-up with him in the office next week. Instructions that should he get worse in any way with increased shortness of breath, orthopnea, chest pain, fatigue, fever or any new symptoms to go directly to the emergency department promptly. Patient is awake and alert and stable showing no signs of distress.    Janne Napoleon, NP 12/03/15 1329

## 2015-12-04 ENCOUNTER — Ambulatory Visit (INDEPENDENT_AMBULATORY_CARE_PROVIDER_SITE_OTHER): Payer: Medicare Other

## 2015-12-04 ENCOUNTER — Telehealth: Payer: Self-pay | Admitting: Cardiology

## 2015-12-04 ENCOUNTER — Telehealth: Payer: Self-pay

## 2015-12-04 DIAGNOSIS — I5022 Chronic systolic (congestive) heart failure: Secondary | ICD-10-CM | POA: Diagnosis not present

## 2015-12-04 DIAGNOSIS — Z950 Presence of cardiac pacemaker: Secondary | ICD-10-CM | POA: Diagnosis not present

## 2015-12-04 NOTE — Telephone Encounter (Signed)
Referred to ICM clinic by Dr Beryle Quant RN.  Patient visited Urgent care on 12/04/2015 for possible fluid symptoms.    Call to patient's son, Levi [facility]) and provided ICM introduction.  Asked if he could send a remote transmission today from patient's monitor to review fluid levels.  Provided ICM number and will call back once when he is at patients house in the next hour.  He agreed to monthly ICM calls.  Advised a scheduler will call him to set up post urgent care visit with either Dr Marlou Porch or PA/NP.

## 2015-12-04 NOTE — Telephone Encounter (Signed)
Needs app next week per Dr. Marlou Porch. Please call son with appointment.

## 2015-12-04 NOTE — Telephone Encounter (Signed)
New message     Pt son wants to know if pt needs to see Dr next week due to pt being in the hospital. He was advised to see the dr post hospital visit next week. Son is unsure of what to do. Please call.

## 2015-12-04 NOTE — Progress Notes (Signed)
EPIC Encounter for ICM Monitoring  Patient Name: Levi Lewis is a 82 y.o. male Date: 12/04/2015 Primary Care Physican: Criselda Peaches, MD Primary Cardiologist: Marlou Porch Electrophysiologist: Caryl Comes Dry Weight:  unknown Bi-V Pacing:  >99%       1st encounter with son, Boy Fabry, (DPR).  Patient was seen at urgent care on 12/03/2015 at suggestion of home physical therapist because lung crackles were heard.  Son reported he thinks patient is at his baseline with SOB and does not see any changes in his condition.  He reported urgent care advised to make an appointment with Dr Marlou Porch in the next week.  Son does not think patient needs follow up in a week since there is an appointment scheduled on 01/15/2016 with Dr Marlou Porch.   Thoracic impedance slightly below baseline on 12/04/2015.  Recommendations: Reviewed transmission with Dr Caryl Comes and recommended no changes at this time.  He advised if son feels patient is at his baseline then it would be fine to wait and see Dr Marlou Porch on 01/15/2016 but if condition changes to call back for appointment or use ER.  Call to son and advised of Dr Aquilla Hacker recommendations.     Follow-up plan: ICM clinic phone appointment on 01/14/2016.  Office visit with Dr Marlou Porch 01/15/2016.  Copy of ICM check sent to primary cardiologist and device physician.   ICM trend: 12/04/2015                Rosalene Billings, RN 12/04/2015 11:40 AM

## 2015-12-09 ENCOUNTER — Emergency Department (HOSPITAL_COMMUNITY): Payer: Medicare Other

## 2015-12-09 ENCOUNTER — Emergency Department (HOSPITAL_COMMUNITY)
Admission: EM | Admit: 2015-12-09 | Discharge: 2015-12-09 | Disposition: A | Discharge status: Home / Self Care | Payer: Medicare Other | Attending: Emergency Medicine | Admitting: Emergency Medicine

## 2015-12-09 DIAGNOSIS — J449 Chronic obstructive pulmonary disease, unspecified: Secondary | ICD-10-CM | POA: Insufficient documentation

## 2015-12-09 DIAGNOSIS — M10022 Idiopathic gout, left elbow: Secondary | ICD-10-CM

## 2015-12-09 DIAGNOSIS — E039 Hypothyroidism, unspecified: Secondary | ICD-10-CM | POA: Insufficient documentation

## 2015-12-09 DIAGNOSIS — I5023 Acute on chronic systolic (congestive) heart failure: Secondary | ICD-10-CM | POA: Insufficient documentation

## 2015-12-09 DIAGNOSIS — M25422 Effusion, left elbow: Secondary | ICD-10-CM

## 2015-12-09 DIAGNOSIS — Z79899 Other long term (current) drug therapy: Secondary | ICD-10-CM | POA: Insufficient documentation

## 2015-12-09 DIAGNOSIS — N184 Chronic kidney disease, stage 4 (severe): Secondary | ICD-10-CM | POA: Insufficient documentation

## 2015-12-09 DIAGNOSIS — Z7901 Long term (current) use of anticoagulants: Secondary | ICD-10-CM | POA: Insufficient documentation

## 2015-12-09 DIAGNOSIS — I2510 Atherosclerotic heart disease of native coronary artery without angina pectoris: Secondary | ICD-10-CM | POA: Insufficient documentation

## 2015-12-09 DIAGNOSIS — I130 Hypertensive heart and chronic kidney disease with heart failure and stage 1 through stage 4 chronic kidney disease, or unspecified chronic kidney disease: Secondary | ICD-10-CM | POA: Diagnosis not present

## 2015-12-09 DIAGNOSIS — M79603 Pain in arm, unspecified: Secondary | ICD-10-CM | POA: Diagnosis present

## 2015-12-09 DIAGNOSIS — Z950 Presence of cardiac pacemaker: Secondary | ICD-10-CM | POA: Insufficient documentation

## 2015-12-09 DIAGNOSIS — R52 Pain, unspecified: Secondary | ICD-10-CM

## 2015-12-09 LAB — CBC WITH DIFFERENTIAL/PLATELET
BASOS ABS: 0 10*3/uL (ref 0.0–0.1)
BASOS PCT: 0 %
EOS ABS: 0 10*3/uL (ref 0.0–0.7)
EOS PCT: 0 %
HCT: 37.2 % — ABNORMAL LOW (ref 39.0–52.0)
Hemoglobin: 11.8 g/dL — ABNORMAL LOW (ref 13.0–17.0)
LYMPHS ABS: 1.4 10*3/uL (ref 0.7–4.0)
Lymphocytes Relative: 14 %
MCH: 30.3 pg (ref 26.0–34.0)
MCHC: 31.7 g/dL (ref 30.0–36.0)
MCV: 95.6 fL (ref 78.0–100.0)
Monocytes Absolute: 1.2 10*3/uL — ABNORMAL HIGH (ref 0.1–1.0)
Monocytes Relative: 12 %
Neutro Abs: 7.8 10*3/uL — ABNORMAL HIGH (ref 1.7–7.7)
Neutrophils Relative %: 74 %
PLATELETS: 234 10*3/uL (ref 150–400)
RBC: 3.89 MIL/uL — AB (ref 4.22–5.81)
RDW: 14.3 % (ref 11.5–15.5)
WBC: 10.5 10*3/uL (ref 4.0–10.5)

## 2015-12-09 LAB — BASIC METABOLIC PANEL
Anion gap: 8 (ref 5–15)
BUN: 27 mg/dL — AB (ref 6–20)
CO2: 26 mmol/L (ref 22–32)
CREATININE: 1.71 mg/dL — AB (ref 0.61–1.24)
Calcium: 8.7 mg/dL — ABNORMAL LOW (ref 8.9–10.3)
Chloride: 108 mmol/L (ref 101–111)
GFR, EST AFRICAN AMERICAN: 37 mL/min — AB (ref >60)
GFR, EST NON AFRICAN AMERICAN: 32 mL/min — AB (ref >60)
Glucose, Bld: 135 mg/dL — ABNORMAL HIGH (ref 65–99)
POTASSIUM: 3.5 mmol/L (ref 3.5–5.1)
SODIUM: 142 mmol/L (ref 135–145)

## 2015-12-09 LAB — SYNOVIAL CELL COUNT + DIFF, W/ CRYSTALS
Eosinophils-Synovial: 0 % (ref 0–1)
Lymphocytes-Synovial Fld: 0 % (ref 0–20)
Monocyte-Macrophage-Synovial Fluid: 3 % — ABNORMAL LOW (ref 50–90)
Neutrophil, Synovial: 97 % — ABNORMAL HIGH (ref 0–25)
Other Cells-SYN: 0
WBC, Synovial: 62000 /mm3 — ABNORMAL HIGH (ref 0–200)

## 2015-12-09 LAB — CBG MONITORING, ED: Glucose-Capillary: 135 mg/dL — ABNORMAL HIGH (ref 65–99)

## 2015-12-09 LAB — SEDIMENTATION RATE: Sed Rate: 43 mm/h — ABNORMAL HIGH (ref 0–16)

## 2015-12-09 LAB — C-REACTIVE PROTEIN: CRP: 6.2 mg/dL — ABNORMAL HIGH (ref <1.0)

## 2015-12-09 MED ORDER — COLCHICINE 0.6 MG PO TABS: 0.6000 mg | ORAL_TABLET | Freq: Two times a day (BID) | ORAL | 0 refills | Status: DC

## 2015-12-09 MED ORDER — ACETAMINOPHEN 500 MG PO TABS
500.0000 mg | ORAL_TABLET | Freq: Once | ORAL | Status: AC
  Administered 2015-12-09: 500 mg via ORAL
  Filled 2015-12-09: qty 1

## 2015-12-09 MED ORDER — MORPHINE SULFATE 15 MG PO TABS: 15.0000 mg | ORAL_TABLET | Freq: Four times a day (QID) | ORAL | 0 refills | Status: DC | PRN

## 2015-12-09 NOTE — ED Notes (Signed)
cbg was 135

## 2015-12-09 NOTE — ED Provider Notes (Signed)
[facility] DEPT Provider Note   CSN: [id number] Arrival date & time: 12/09/15  [id number]     History   Chief Complaint Chief Complaint  Patient presents with  . Arm Pain    HPI Levi Lewis is a 82 y.o. male.  The history is provided by the patient.  Arm Pain  This is a new problem. The current episode started more than 2 days ago. The problem occurs constantly. Progression since onset: waxing and waing. Pertinent negatives include no chest pain, no abdominal pain and no shortness of breath. Exacerbated by: moving elbow. Nothing relieves the symptoms. He has tried acetaminophen for the symptoms. The treatment provided no relief.    Past Medical History:  Diagnosis Date  . Aortic insufficiency   . Arthritis    "back, knees, right leg" (10/20/2015)  . Atrial fibrillation ([place])   . CAD (coronary artery disease)   . Cardiomyopathy, EF by echo 25-30% 07/14/2013  . CHF (congestive heart failure) ([place])   . Chronic lower back pain   . COPD (chronic obstructive pulmonary disease) ([place])   . Dyslipidemia   . GERD (gastroesophageal reflux disease)   . Gout   . Hypertension   . Hypothyroidism   . Left bundle branch block   . Lung nodules   . Mitral regurgitation   . Pacemaker   . Pneumonia    "2-3 times" (10/20/2015)  . RBBB   . Shortness of breath   . Shoulder pain     Patient Active Problem List   Diagnosis Date Noted  . Acute on chronic systolic CHF (congestive heart failure) ([place])   . Acute hypoxemic respiratory failure ([place]) 10/18/2015  . Fall 10/18/2015  . On amiodarone therapy 11/22/2013  . Suprapubic pain 10/23/2013  . HCAP (healthcare-associated pneumonia) 10/20/2013  . Sepsis ([place]) 10/19/2013  . Hypotension 10/19/2013  . Fatigue 10/15/2013  . Multiple blisters 09/03/2013  . Hypothyroidism 08/05/2013  . Pacemaker 08/04/2013  . Chronic systolic CHF (congestive heart failure) ([place]) 08/04/2013  . HX: anticoagulation 08/04/2013  . CKD (chronic kidney disease)  stage 4, GFR 15-29 ml/min (HCC) 07/14/2013  . Cardiomyopathy, EF by echo 25-30% 07/14/2013  . Atrial fibrillation ([place]) 07/13/2013  . Alternating (bilateral) bundle branch block 07/13/2013  . Back pain   . Shoulder pain   . Left bundle branch block   . CAD (coronary artery disease)   . Hypertension   . Gout   . Dyslipidemia   . Ejection fraction   . Lung nodules   . COPD (chronic obstructive pulmonary disease) ([place])   . Mitral regurgitation   . Aortic insufficiency     Past Surgical History:  Procedure Laterality Date  . BACK SURGERY    . BI-VENTRICULAR PACEMAKER INSERTION (CRT-P)  07-15-2013   STJ Quadra Allura CRTP implanted by Dr Caryl Comes for NICM, CHF, alternating bundle branch blocks  . CARDIOVERSION N/A 09/13/2013   Procedure: CARDIOVERSION;  Surgeon: Carlena Bjornstad, MD;  Location: [facility] ENDOSCOPY;  Service: Cardiovascular;  Laterality: N/A;  . CARDIOVERSION N/A 10/17/2013   Procedure: CARDIOVERSION;  Surgeon: Lelon Perla, MD;  Location: [facility] ENDOSCOPY;  Service: Cardiovascular;  Laterality: N/A;  . CATARACT EXTRACTION W/ INTRAOCULAR LENS  IMPLANT, BILATERAL Bilateral   . CORONARY ANGIOPLASTY    . INSERT / REPLACE / REMOVE PACEMAKER    . KNEE ARTHROSCOPY Right   . LUMBAR DISC SURGERY    . PERMANENT PACEMAKER INSERTION N/A 07/15/2013   Procedure: PERMANENT PACEMAKER INSERTION;  Surgeon: Deboraha Sprang, MD;  Location: [facility] CATH LAB;  Service: Cardiovascular;  Laterality: N/A;  . SHOULDER ARTHROSCOPY Right   . TONSILLECTOMY         Home Medications    Prior to Admission medications   Medication Sig Start Date End Date Taking? Authorizing Provider  carvedilol (COREG) 3.125 MG tablet Take 3.125 mg by mouth at bedtime. Take one tablet (3.125 mg) by mouth every evening    Deboraha Sprang, MD  ELIQUIS 2.5 MG TABS tablet TAKE 1 TABLET (2.5 MG TOTAL) BY MOUTH 2 (TWO) TIMES DAILY. 10/05/15   Deboraha Sprang, MD  fenofibrate (TRICOR) 48 MG tablet TAKE 1/2 TABLET BY MOUTH DAILY 07/01/15    Jerline Pain, MD  fish oil-omega-3 fatty acids 1000 MG capsule Take 2 g by mouth daily.     Historical Provider, MD  furosemide (LASIX) 40 MG tablet Take 1 tablet (40 mg total) by mouth daily at 6 PM. 10/21/15   Kelvin Cellar, MD  furosemide (LASIX) 80 MG tablet Take 1 tablet (80 mg total) by mouth daily. 10/22/15   Kelvin Cellar, MD  gabapentin (NEURONTIN) 300 MG capsule Take 300 mg by mouth 2 (two) times daily. 09/09/15   Historical Provider, MD  guaiFENesin (MUCINEX) 600 MG 12 hr tablet Take 600 mg by mouth 2 (two) times daily as needed for cough or to loosen phlegm (COLDS).     Historical Provider, MD  levothyroxine (SYNTHROID, LEVOTHROID) 75 MCG tablet Take 1 tablet (75 mcg total) by mouth daily. 04/21/14   Carlena Bjornstad, MD  Multiple Vitamins-Minerals (OCUVITE PO) Take 2 tablets by mouth daily.     Historical Provider, MD  omeprazole (PRILOSEC) 20 MG capsule Take 20 mg by mouth daily.     Historical Provider, MD  predniSONE (DELTASONE) 10 MG tablet Take 5 mg by mouth daily. 09/04/15   Historical Provider, MD  sodium chloride (OCEAN) 0.65 % SOLN nasal spray Place 1 spray into both nostrils daily as needed for congestion.     Historical Provider, MD  tamsulosin (FLOMAX) 0.4 MG CAPS capsule Take 0.4 mg by mouth 2 (two) times daily.     Historical Provider, MD  triazolam (HALCION) 0.25 MG tablet Take 0.5 tablets (0.125 mg total) by mouth at bedtime as needed for sleep. 10/24/13   Delfina Redwood, MD    Family History Family History  Problem Relation Age of Onset  . Heart attack Sister   . Lung cancer Brother     smoker  . Colon cancer Neg Hx     Social History Social History  Substance Use Topics  . Smoking status: Never Smoker  . Smokeless tobacco: Never Used  . Alcohol use No     Allergies   Review of patient's allergies indicates no known allergies.   Review of Systems Review of Systems  Respiratory: Negative for shortness of breath.   Cardiovascular: Negative for chest  pain.  Gastrointestinal: Negative for abdominal pain.  All other systems reviewed and are negative.    Physical Exam Updated Vital Signs BP 112/61   Pulse 70   Temp 98.3 F (36.8 C) (Oral)   Resp 18   Ht 5\' 10"  (1.778 m)   Wt 180 lb (81.6 kg)   SpO2 93%   BMI 25.83 kg/m   Physical Exam  Constitutional: He is oriented to person, place, and time. He appears well-developed and well-nourished. No distress.  HENT:  Head: Normocephalic and atraumatic.  Eyes: Conjunctivae are normal.  Neck: Neck supple. No tracheal  deviation present.  Cardiovascular: Normal rate and regular rhythm.   Pulmonary/Chest: Effort normal. No respiratory distress.  Abdominal: Soft. He exhibits no distension.  Musculoskeletal:       Left elbow: He exhibits swelling and effusion (with warmth).  Neurological: He is alert and oriented to person, place, and time.  Skin: Skin is warm and dry.  Psychiatric: He has a normal mood and affect.  Vitals reviewed.    ED Treatments / Results  Labs (all labs ordered are listed, but only abnormal results are displayed) Labs Reviewed  SYNOVIAL CELL COUNT + DIFF, W/ CRYSTALS - Abnormal; Notable for the following:       Result Value   Color, Synovial YELLOW (*)    Appearance-Synovial TURBID (*)    WBC, Synovial 62,000 (*)    Neutrophil, Synovial 97 (*)    Monocyte-Macrophage-Synovial Fluid 3 (*)    All other components within normal limits  CBC WITH DIFFERENTIAL/PLATELET - Abnormal; Notable for the following:    RBC 3.89 (*)    Hemoglobin 11.8 (*)    HCT 37.2 (*)    Neutro Abs 7.8 (*)    Monocytes Absolute 1.2 (*)    All other components within normal limits  BASIC METABOLIC PANEL - Abnormal; Notable for the following:    Glucose, Bld 135 (*)    BUN 27 (*)    Creatinine, Ser 1.71 (*)    Calcium 8.7 (*)    GFR calc non Af Amer 32 (*)    GFR calc Af Amer 37 (*)    All other components within normal limits  SEDIMENTATION RATE - Abnormal; Notable for the  following:    Sed Rate 43 (*)    All other components within normal limits  C-REACTIVE PROTEIN - Abnormal; Notable for the following:    CRP 6.2 (*)    All other components within normal limits  CBG MONITORING, ED - Abnormal; Notable for the following:    Glucose-Capillary 135 (*)    All other components within normal limits  BODY FLUID CULTURE    EKG  EKG Interpretation None       Radiology Dg Elbow 2 Views Left  Result Date: 12/09/2015 CLINICAL DATA:  Left elbow pain and swelling since yesterday. Reported arthrocentesis prior to radiograph. EXAM: LEFT ELBOW - 2 VIEW COMPARISON:  None. FINDINGS: There is evidence of a sizable elbow joint effusion on the lateral view. There is no evidence of intra-articular air or loose body. The mineralization and alignment are normal. No evidence of acute fracture, dislocation or bone destruction. Minimal degenerative change medially for age. IMPRESSION: No acute osseous findings or evidence of osteomyelitis. Nonspecific elbow joint effusion. Electronically Signed   By: Richardean Sale M.D.   On: 12/09/2015 12:43    Procedures .Joint Aspiration/Arthrocentesis Date/Time: 12/09/2015 12:01 PM Performed by: Leo Grosser Authorized by: Leo Grosser   Consent:    Consent obtained:  Verbal   Consent given by:  Patient   Risks discussed:  Bleeding, infection and pain   Alternatives discussed:  Observation Location:    Location:  Elbow   Elbow:  L elbow Anesthesia (see MAR for exact dosages):    Anesthesia method:  None Procedure details:    Preparation: Patient was prepped and draped in usual sterile fashion     Needle gauge: 25G.   Ultrasound guidance: no     Approach:  Lateral   Aspirate amount:  2.5cc   Aspirate characteristics:  Serous, yellow and cloudy   Steroid  injected: no     Specimen collected: yes   Post-procedure details:    Dressing:  Adhesive bandage   Patient tolerance of procedure:  Tolerated well, no immediate  complications   (including critical care time)  Medications Ordered in ED Medications  acetaminophen (TYLENOL) tablet 500 mg (500 mg Oral Given 12/09/15 1217)     Initial Impression / Assessment and Plan / ED Course  I have reviewed the triage vital signs and the nursing notes.  Pertinent labs & imaging results that were available during my care of the patient were reviewed by me and considered in my medical decision making (see chart for details).  Clinical Course    82 y.o. male presents with left elbow and arm pain with swelling and elbow effusion. Not febrile. arthrocentesis performed to r/o septic joint demonstrates crystals c/w acute gout flare. Pt with h/o same in LE but never in elbow previously. Plan for d/c on colchicine and pain control. No NSAIDs recommended d/t advanced age. Culture forthcoming but no indication for empiric ABx currently. Plan to follow up with PCP as needed and return precautions discussed for worsening or new concerning symptoms.    Final Clinical Impressions(s) / ED Diagnoses   Final diagnoses:  Acute idiopathic gout of left elbow    New Prescriptions Discharge Medication List as of 12/09/2015  2:19 PM    START taking these medications   Details  colchicine 0.6 MG tablet Take 1 tablet (0.6 mg total) by mouth 2 (two) times daily., Starting Wed 12/09/2015, Print    morphine (MSIR) 15 MG tablet Take 1 tablet (15 mg total) by mouth every 6 (six) hours as needed for severe pain., Starting Wed 12/09/2015, Print         Leo Grosser, MD 12/09/15 [phone]

## 2015-12-13 LAB — BODY FLUID CULTURE: CULTURE: NO GROWTH

## 2015-12-14 ENCOUNTER — Other Ambulatory Visit: Payer: Self-pay | Admitting: Cardiology

## 2015-12-18 LAB — CUP PACEART INCLINIC DEVICE CHECK
Brady Statistic RA Percent Paced: 90 %
Date Time Interrogation Session: 20170605130905
Implantable Lead Implant Date: 20150330
Implantable Lead Implant Date: 20150330
Lead Channel Impedance Value: 412.5 Ohm
Lead Channel Pacing Threshold Amplitude: 1 V
Lead Channel Pacing Threshold Amplitude: 1.25 V
Lead Channel Pacing Threshold Pulse Width: 0.4 ms
Lead Channel Pacing Threshold Pulse Width: 0.4 ms
Lead Channel Setting Pacing Amplitude: 1.5 V
MDC IDC LEAD IMPLANT DT: 20150330
MDC IDC LEAD LOCATION: 753858
MDC IDC LEAD LOCATION: 753859
MDC IDC LEAD LOCATION: 753860
MDC IDC MSMT BATTERY REMAINING LONGEVITY: 78
MDC IDC MSMT BATTERY VOLTAGE: 2.98 V
MDC IDC MSMT LEADCHNL LV IMPEDANCE VALUE: 450 Ohm
MDC IDC MSMT LEADCHNL LV PACING THRESHOLD AMPLITUDE: 1 V
MDC IDC MSMT LEADCHNL RA PACING THRESHOLD PULSEWIDTH: 0.6 ms
MDC IDC MSMT LEADCHNL RA SENSING INTR AMPL: 0.9 mV
MDC IDC MSMT LEADCHNL RV IMPEDANCE VALUE: 412.5 Ohm
MDC IDC MSMT LEADCHNL RV PACING THRESHOLD AMPLITUDE: 1.25 V
MDC IDC MSMT LEADCHNL RV PACING THRESHOLD PULSEWIDTH: 0.4 ms
MDC IDC MSMT LEADCHNL RV SENSING INTR AMPL: 12 mV
MDC IDC SET LEADCHNL LV PACING PULSEWIDTH: 0.4 ms
MDC IDC SET LEADCHNL RA PACING AMPLITUDE: 2.5 V
MDC IDC SET LEADCHNL RV PACING AMPLITUDE: 2 V
MDC IDC SET LEADCHNL RV PACING PULSEWIDTH: 0.4 ms
MDC IDC SET LEADCHNL RV SENSING SENSITIVITY: 2 mV
MDC IDC STAT BRADY RV PERCENT PACED: 93 %
Pulse Gen Model: 3242
Pulse Gen Serial Number: 2978403

## 2015-12-22 ENCOUNTER — Ambulatory Visit (INDEPENDENT_AMBULATORY_CARE_PROVIDER_SITE_OTHER): Payer: Medicare Other | Admitting: *Deleted

## 2015-12-22 DIAGNOSIS — Z950 Presence of cardiac pacemaker: Secondary | ICD-10-CM

## 2015-12-22 DIAGNOSIS — I495 Sick sinus syndrome: Secondary | ICD-10-CM

## 2015-12-23 NOTE — Progress Notes (Signed)
Remote pacemaker transmission.   

## 2015-12-25 ENCOUNTER — Encounter: Payer: Self-pay | Admitting: Cardiology

## 2015-12-29 LAB — CUP PACEART REMOTE DEVICE CHECK
Battery Remaining Longevity: 81 mo
Brady Statistic AP VP Percent: 71 %
Brady Statistic AP VS Percent: 1 %
Brady Statistic AS VS Percent: 1 %
Implantable Lead Implant Date: 20150330
Implantable Lead Location: 753858
Implantable Lead Location: 753860
Lead Channel Impedance Value: 430 Ohm
Lead Channel Impedance Value: 440 Ohm
Lead Channel Pacing Threshold Amplitude: 1 V
Lead Channel Pacing Threshold Amplitude: 1.125 V
Lead Channel Pacing Threshold Pulse Width: 0.4 ms
Lead Channel Sensing Intrinsic Amplitude: 6.7 mV
Lead Channel Setting Pacing Amplitude: 2.5 V
Lead Channel Setting Pacing Pulse Width: 0.4 ms
Lead Channel Setting Sensing Sensitivity: 2 mV
MDC IDC LEAD IMPLANT DT: 20150330
MDC IDC LEAD IMPLANT DT: 20150330
MDC IDC LEAD LOCATION: 753859
MDC IDC MSMT BATTERY REMAINING PERCENTAGE: 95.5 %
MDC IDC MSMT BATTERY VOLTAGE: 2.98 V
MDC IDC MSMT LEADCHNL RA PACING THRESHOLD AMPLITUDE: 1 V
MDC IDC MSMT LEADCHNL RA PACING THRESHOLD PULSEWIDTH: 0.6 ms
MDC IDC MSMT LEADCHNL RA SENSING INTR AMPL: 0.5 mV
MDC IDC MSMT LEADCHNL RV IMPEDANCE VALUE: 400 Ohm
MDC IDC MSMT LEADCHNL RV PACING THRESHOLD PULSEWIDTH: 0.4 ms
MDC IDC PG MODEL: 3242
MDC IDC PG SERIAL: 2978403
MDC IDC SESS DTM: 20170905060015
MDC IDC SET LEADCHNL LV PACING AMPLITUDE: 1.5 V
MDC IDC SET LEADCHNL LV PACING PULSEWIDTH: 0.4 ms
MDC IDC SET LEADCHNL RV PACING AMPLITUDE: 2.125
MDC IDC STAT BRADY AS VP PERCENT: 28 %
MDC IDC STAT BRADY RA PERCENT PACED: 72 %

## 2016-01-01 ENCOUNTER — Ambulatory Visit: Payer: Medicare Other | Admitting: Cardiology

## 2016-01-08 ENCOUNTER — Encounter: Payer: Self-pay | Admitting: Cardiology

## 2016-01-14 ENCOUNTER — Ambulatory Visit (INDEPENDENT_AMBULATORY_CARE_PROVIDER_SITE_OTHER): Payer: Medicare Other

## 2016-01-14 DIAGNOSIS — I5022 Chronic systolic (congestive) heart failure: Secondary | ICD-10-CM | POA: Diagnosis not present

## 2016-01-14 DIAGNOSIS — Z950 Presence of cardiac pacemaker: Secondary | ICD-10-CM | POA: Diagnosis not present

## 2016-01-14 NOTE — Progress Notes (Signed)
EPIC Encounter for ICM Monitoring  Patient Name: Levi Lewis is a 82 y.o. male Date: 01/14/2016 Primary Care Physican: Criselda Peaches, MD Primary Cardiologist: Marlou Porch Electrophysiologist: Caryl Comes Dry Weight:  unknown Bi-V Pacing:  >99%       Spoke with son, Ronalee Belts.  Heart Failure questions reviewed, pt asymptomatic.  He did visit ER for elbow gout.   Thoracic impedance normal   Recommendations: No changes.      Follow-up plan: ICM clinic phone appointment on 02/16/2016.  Copy of ICM check sent to Dr Marlou Porch and Dr Caryl Comes.   ICM trend: 01/14/2016            Rosalene Billings, RN 01/14/2016 10:59 AM

## 2016-01-14 NOTE — Progress Notes (Signed)
This encounter was created in error - please disregard.

## 2016-01-15 ENCOUNTER — Ambulatory Visit (INDEPENDENT_AMBULATORY_CARE_PROVIDER_SITE_OTHER): Payer: Medicare Other | Admitting: Cardiology

## 2016-01-15 ENCOUNTER — Encounter: Payer: Self-pay | Admitting: Cardiology

## 2016-01-15 ENCOUNTER — Other Ambulatory Visit: Payer: Self-pay | Admitting: Cardiology

## 2016-01-15 VITALS — BP 118/58 | HR 86 | Ht 70.0 in | Wt 180.8 lb

## 2016-01-15 DIAGNOSIS — E785 Hyperlipidemia, unspecified: Secondary | ICD-10-CM | POA: Diagnosis not present

## 2016-01-15 DIAGNOSIS — I5022 Chronic systolic (congestive) heart failure: Secondary | ICD-10-CM | POA: Diagnosis not present

## 2016-01-15 DIAGNOSIS — I495 Sick sinus syndrome: Secondary | ICD-10-CM | POA: Diagnosis not present

## 2016-01-15 DIAGNOSIS — I480 Paroxysmal atrial fibrillation: Secondary | ICD-10-CM

## 2016-01-15 DIAGNOSIS — Z950 Presence of cardiac pacemaker: Secondary | ICD-10-CM

## 2016-01-15 NOTE — Patient Instructions (Signed)

## 2016-01-15 NOTE — Progress Notes (Signed)
Cardiology Office Note    Date:  01/15/2016   ID:  Levi Lewis, DOB 1917/06/01, MRN [id number]  PCP:  Criselda Peaches, MD  Cardiologist:   Candee Furbish, MD (Former Ron Parker)    History of Present Illness:  Levi Lewis is a 82 y.o. male former patient of Dr. Ron Parker with atrial fibrillation off of amiodarone, pacemaker for sick sinus syndrome. Limited by knee pain, back pain , no significant shortness of breath.  Cardiomyopathy previous echo showed EF of 25-30%, COPD also noted.  Overall he is been doing well without any significant change in his symptoms. He does have baseline shortness of breath which is no change.   No bleeding, no  syncope, no chest pain. No orthopnea, no PND. He was in the [employer] in [place] in the [place].  Stable from before. Dr. Nyoka Cowden.   Past Medical History:  Diagnosis Date  . Aortic insufficiency   . Arthritis    "back, knees, right leg" (10/20/2015)  . Atrial fibrillation ([place])   . CAD (coronary artery disease)   . Cardiomyopathy, EF by echo 25-30% 07/14/2013  . CHF (congestive heart failure) ([place])   . Chronic lower back pain   . COPD (chronic obstructive pulmonary disease) ([place])   . Dyslipidemia   . GERD (gastroesophageal reflux disease)   . Gout   . Hypertension   . Hypothyroidism   . Left bundle branch block   . Lung nodules   . Mitral regurgitation   . Pacemaker   . Pneumonia    "2-3 times" (10/20/2015)  . RBBB   . Shortness of breath   . Shoulder pain     Past Surgical History:  Procedure Laterality Date  . BACK SURGERY    . BI-VENTRICULAR PACEMAKER INSERTION (CRT-P)  07-15-2013   STJ Quadra Allura CRTP implanted by Dr Caryl Comes for NICM, CHF, alternating bundle branch blocks  . CARDIOVERSION N/A 09/13/2013   Procedure: CARDIOVERSION;  Surgeon: Carlena Bjornstad, MD;  Location[facility] ENDOSCOPY;  Service: Cardiovascular;  Laterality: N/A;  . CARDIOVERSION N/A 10/17/2013   Procedure: CARDIOVERSION;  Surgeon: Lelon Perla, MD;  Location: [facility]  ENDOSCOPY;  Service: Cardiovascular;  Laterality: N/A;  . CATARACT EXTRACTION W/ INTRAOCULAR LENS  IMPLANT, BILATERAL Bilateral   . CORONARY ANGIOPLASTY    . INSERT / REPLACE / REMOVE PACEMAKER    . KNEE ARTHROSCOPY Right   . LUMBAR DISC SURGERY    . PERMANENT PACEMAKER INSERTION N/A 07/15/2013   Procedure: PERMANENT PACEMAKER INSERTION;  Surgeon: Deboraha Sprang, MD;  Location: [facility] CATH LAB;  Service: Cardiovascular;  Laterality: N/A;  . SHOULDER ARTHROSCOPY Right   . TONSILLECTOMY      Outpatient Medications Prior to Visit  Medication Sig Dispense Refill  . carvedilol (COREG) 3.125 MG tablet Take 3.125 mg by mouth at bedtime. Take one tablet (3.125 mg) by mouth every evening    . colchicine 0.6 MG tablet Take 1 tablet (0.6 mg total) by mouth 2 (two) times daily. 10 tablet 0  . ELIQUIS 2.5 MG TABS tablet TAKE 1 TABLET (2.5 MG TOTAL) BY MOUTH 2 (TWO) TIMES DAILY. 180 tablet 2  . fenofibrate (TRICOR) 48 MG tablet TAKE 1/2 TABLET BY MOUTH DAILY 15 tablet 9  . fish oil-omega-3 fatty acids 1000 MG capsule Take 2 g by mouth daily.     . furosemide (LASIX) 40 MG tablet Take 1 tablet (40 mg total) by mouth daily at 6 PM. 30 tablet 1  . furosemide (  LASIX) 80 MG tablet Take 1 tablet (80 mg total) by mouth daily. 30 tablet 1  . gabapentin (NEURONTIN) 300 MG capsule Take 300 mg by mouth 2 (two) times daily.  12  . guaiFENesin (MUCINEX) 600 MG 12 hr tablet Take 600 mg by mouth 2 (two) times daily as needed for cough or to loosen phlegm (COLDS).     Marland Kitchen levothyroxine (SYNTHROID, LEVOTHROID) 75 MCG tablet Take 1 tablet (75 mcg total) by mouth daily. 30 tablet 6  . morphine (MSIR) 15 MG tablet Take 1 tablet (15 mg total) by mouth every 6 (six) hours as needed for severe pain. 4 tablet 0  . Multiple Vitamins-Minerals (OCUVITE PO) Take 2 tablets by mouth daily.     Marland Kitchen omeprazole (PRILOSEC) 20 MG capsule Take 20 mg by mouth daily.     . predniSONE (DELTASONE) 10 MG tablet Take 5 mg by mouth daily.  12  .  tamsulosin (FLOMAX) 0.4 MG CAPS capsule Take 0.4 mg by mouth 2 (two) times daily.     . triazolam (HALCION) 0.25 MG tablet Take 0.5 tablets (0.125 mg total) by mouth at bedtime as needed for sleep. 15 tablet 0  . sodium chloride (OCEAN) 0.65 % SOLN nasal spray Place 1 spray into both nostrils daily as needed for congestion.      No facility-administered medications prior to visit.      Allergies:   Review of patient's allergies indicates no known allergies.   Social History   Social History  . Marital status: Married    Spouse name: N/A  . Number of children: N/A  . Years of education: N/A   Social History Main Topics  . Smoking status: Never Smoker  . Smokeless tobacco: Never Used  . Alcohol use No  . Drug use: No  . Sexual activity: Not Currently   Other Topics Concern  . None   Social History Narrative   He has never smoked or drank. No illcit drug use . He is retired from the [employer] and lives with his wife. Illicit Drug Use- no     Family History:  The patient's family history includes Heart attack in his sister; Lung cancer in his brother.   ROS:   Please see the history of present illness.    ROS  No bleeding, no syncope, no orthopnea, no PND. Baseline shortness of breath noted. No chest pain. All other systems reviewed and are negative.   PHYSICAL EXAM:   VS:  BP (!) 118/58   Pulse 86   Ht 5\' 10"  (1.778 m)   Wt 180 lb 12.8 oz (82 kg)   BMI 25.94 kg/m    GEN: Well nourished, well developed, elderly in no acute distress HEENT: normal Neck: no JVD, carotid bruits, or masses Cardiac: RRR; no murmurs, rubs, or gallops,no edema  Respiratory:  clear to auscultation bilaterally, normal work of breathing GI: soft, nontender, nondistended, + BS MS: no deformity or atrophy Skin: warm and dry, no rash bruising noted, right-sided keratosis noted on face. Neuro:  Alert and Oriented x 3, Strength and sensation are intact Psych: euthymic mood, full affect  Wt  Readings from Last 3 Encounters:  01/15/16 180 lb 12.8 oz (82 kg)  12/09/15 180 lb (81.6 kg)  10/28/15 181 lb 9.6 oz (82.4 kg)      Studies/Labs Reviewed:   EKG:  EKG is not ordered today.    Recent Labs: 06/22/2015: TSH 1.18 10/18/2015: B Natriuretic Peptide 721.7 10/19/2015: ALT 11 10/21/2015:  Magnesium 2.1 12/09/2015: BUN 27; Creatinine, Ser 1.71; Hemoglobin 11.8; Platelets 234; Potassium 3.5; Sodium 142   Lipid Panel    Component Value Date/Time   CHOL 166 09/17/2010 1032   TRIG 224.0 (H) 09/17/2010 1032   HDL 37.40 (L) 09/17/2010 1032   CHOLHDL 4 09/17/2010 1032   VLDL 44.8 (H) 09/17/2010 1032   LDLCALC (H) 08/07/2008 0500    113        Total Cholesterol/HDL:CHD Risk Coronary Heart Disease Risk Table                     Men   Women  1/2 Average Risk   3.4   3.3  Average Risk       5.0   4.4  2 X Average Risk   9.6   7.1  3 X Average Risk  23.4   11.0        Use the calculated Patient Ratio above and the CHD Risk Table to determine the patient's CHD Risk.        ATP III CLASSIFICATION (LDL):  <100     mg/dL   Optimal  100-129  mg/dL   Near or Above                    Optimal  130-159  mg/dL   Borderline  160-189  mg/dL   High  >190     mg/dL   Very High   LDLDIRECT 109.6 09/17/2010 1032    Additional studies/ records that were reviewed today include:   prior office notes reviewed, lab work reviewed    ASSESSMENT:    1. Chronic systolic CHF (congestive heart failure) ([place])   2. SSS (sick sinus syndrome) (HCC)   3. Paroxysmal atrial fibrillation ([place])   4. Hyperlipidemia   5. Biventricular cardiac pacemaker in situ      PLAN:  In order of problems listed above:   Paroxysmal atrial fibrillation - amiodarone now off  -- maintaining sinus rhythm -  Pacemaker functioning well    Chronic anticoagulation - Eliquis 2.5 mg twice a day , dose adjusted because of age, Creatinine 1.7   Pacemaker - functioning well. - Dr. Caryl Comes   Chronic systolic heart  failure -  Low-dose carvedilol. Unable to tolerate higher doses because of low blood pressure. - clinically he is stable. No indication to check echocardiogram at this time. Fine status is normal.  Chronic kidney disease stage IV  - Creatinine 1.7 on 11/2015, lab work recently done by Dr. Nyoka Cowden. He is on Eliquis.   Medication Adjustments/Labs and Tests Ordered: Current medicines are reviewed at length with the patient today.  Concerns regarding medicines are outlined above.  Medication changes, Labs and Tests ordered today are listed in the Patient Instructions below. Patient Instructions  Medication Instructions:  The current medical regimen is effective;  continue present plan and medications.  Follow-Up: Follow up in 1 year with Dr. Marlou Porch.  You will receive a letter in the mail 2 months before you are due.  Please call us when you receive this letter to schedule your follow up appointment.  If you need a refill on your cardiac medications before your next appointment, please call your pharmacy. Thank you for choosing [facility]!!          Signed, Candee Furbish, MD  01/15/2016 9:02 AM    [facility] [place], [place], [place]  [address] Phone: [phone]; Fax: [phone]   

## 2016-02-16 ENCOUNTER — Ambulatory Visit (INDEPENDENT_AMBULATORY_CARE_PROVIDER_SITE_OTHER): Payer: Medicare Other

## 2016-02-16 DIAGNOSIS — I5022 Chronic systolic (congestive) heart failure: Secondary | ICD-10-CM

## 2016-02-16 DIAGNOSIS — Z950 Presence of cardiac pacemaker: Secondary | ICD-10-CM

## 2016-02-16 NOTE — Progress Notes (Signed)
EPIC Encounter for ICM Monitoring  Patient Name: Levi Lewis is a 82 y.o. male Date: 02/16/2016 Primary Care Physican: Criselda Peaches, MD Primary Cardiologist:[place] Electrophysiologist: Caryl Comes Dry Weight:     unknown Bi-V Pacing:  >99%                Spoke with son, Levi Lewis.  Heart Failure questions reviewed, pt asymptomatic   Thoracic impedance normal   Recommendations: No changes.  Advised to limit salt intake to 2000 mg daily.  Encouraged to call for fluid symptoms.    Follow-up plan: ICM clinic phone appointment on 03/18/2016.  Copy of ICM check sent to device physician.   ICM trend: 02/16/2016       Rosalene Billings, RN 02/16/2016 2:13 PM

## 2016-03-02 IMAGING — CT CT ABD-PELV W/O CM
2 of 4 series · 16 of 46 positions shown, 18 images · non-contrast
Comparison: 10/23/2013

CLINICAL DATA: Pt been having right flank for a while, left flank
pain for a week. Bloating x 2-3 days. Oral only due to high
creatinine.

EXAM:
CT ABDOMEN AND PELVIS WITHOUT CONTRAST
TECHNIQUE: Multidetector CT imaging of the abdomen and pelvis was performed
following the standard protocol without IV contrast.

[Series 2: a/p w/o 5mm · axial · non-contrast · 0.83mm/px · z∈[+791,+1211]mm · 13 of 93 slices shown, 15 images]
[im 5/93  soft-tissue]
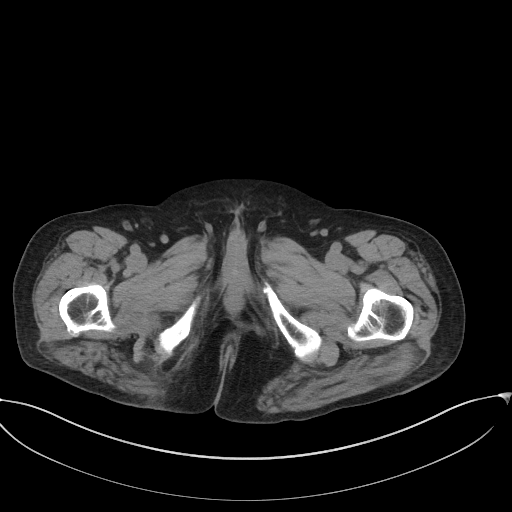
[im 5/93  bone]
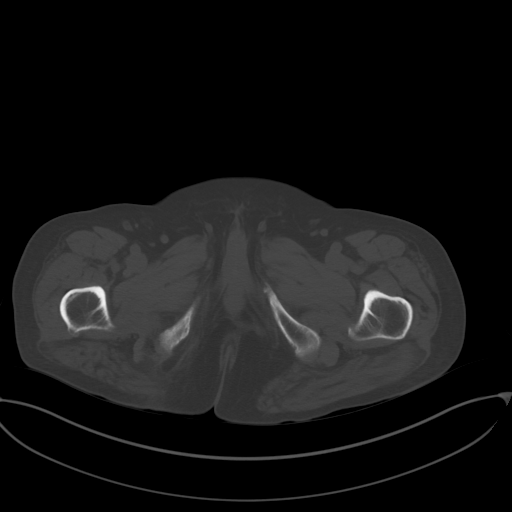
[im 13/93  soft-tissue]
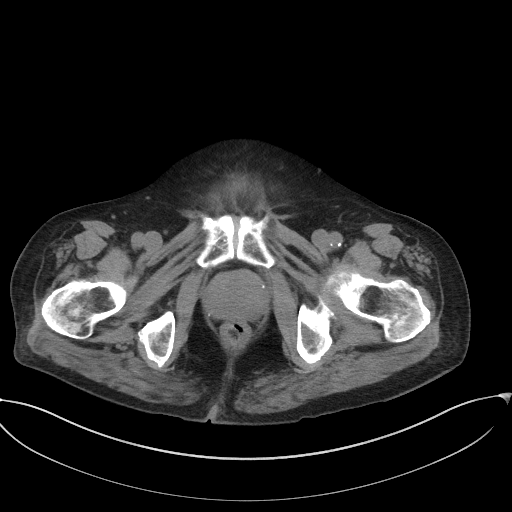
[im 21/93  soft-tissue]
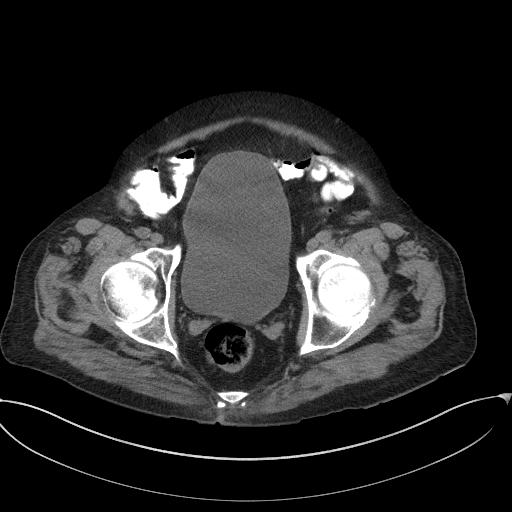
[im 25/93  soft-tissue]
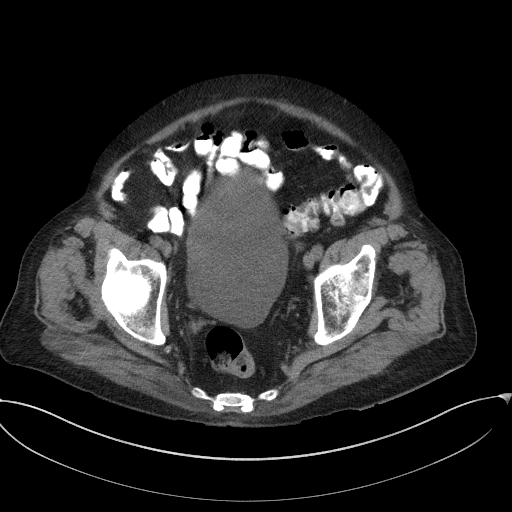
[im 33/93  soft-tissue]
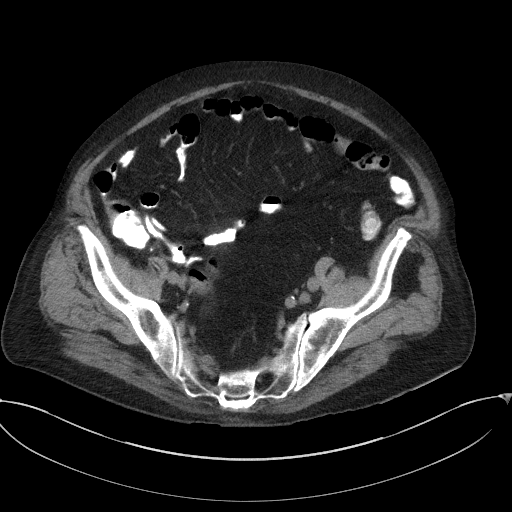
[im 41/93  soft-tissue]
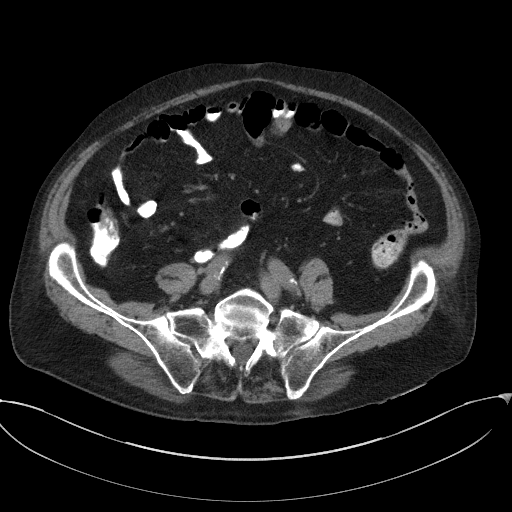
[im 49/93  soft-tissue]
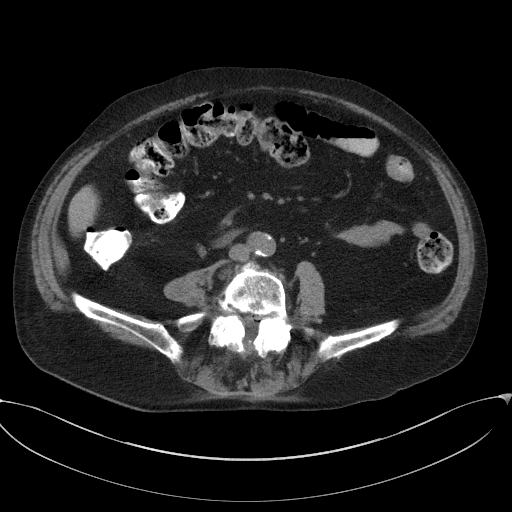
[im 53/93  soft-tissue]
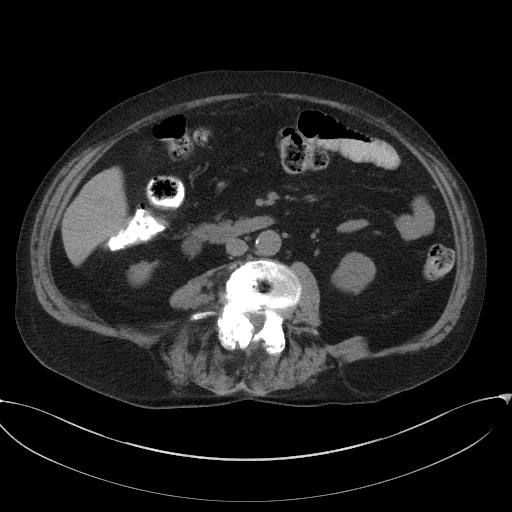
[im 61/93  soft-tissue]
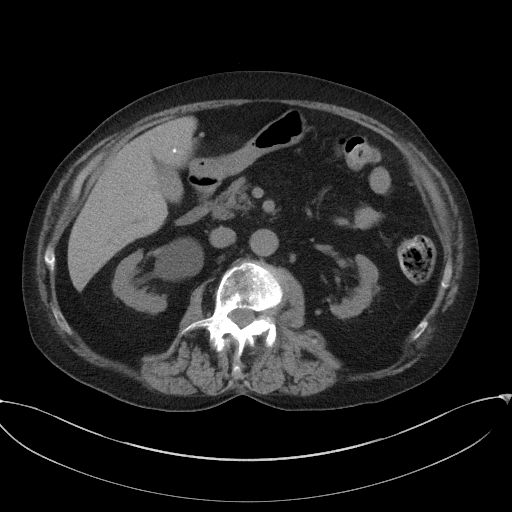
[im 61/93  bone]
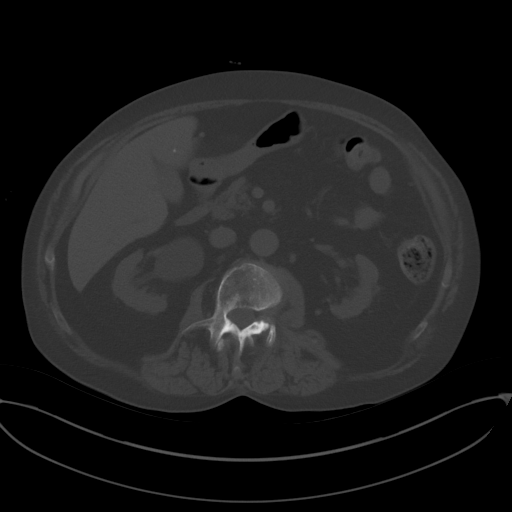
[im 69/93  soft-tissue]
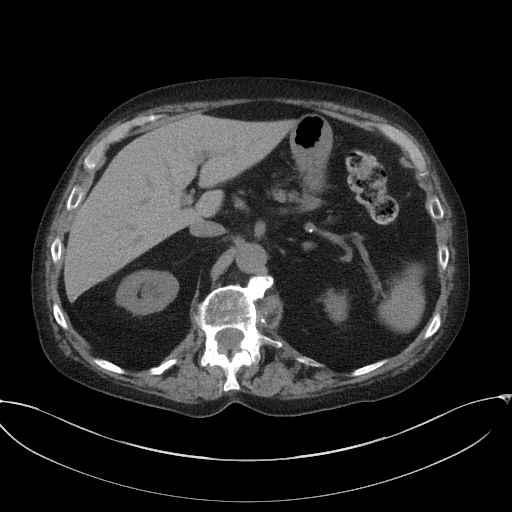
[im 73/93  soft-tissue]
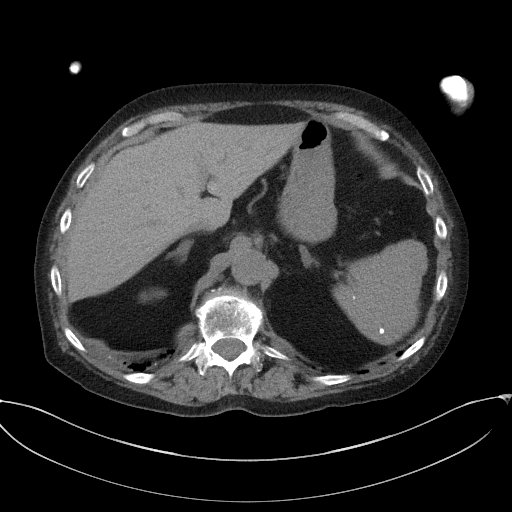
[im 81/93  soft-tissue]
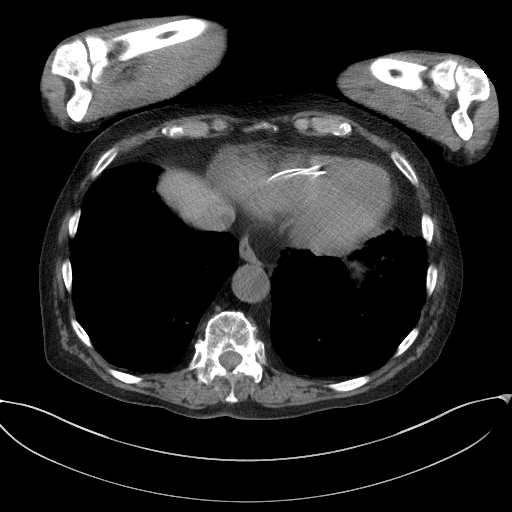
[im 89/93  soft-tissue]
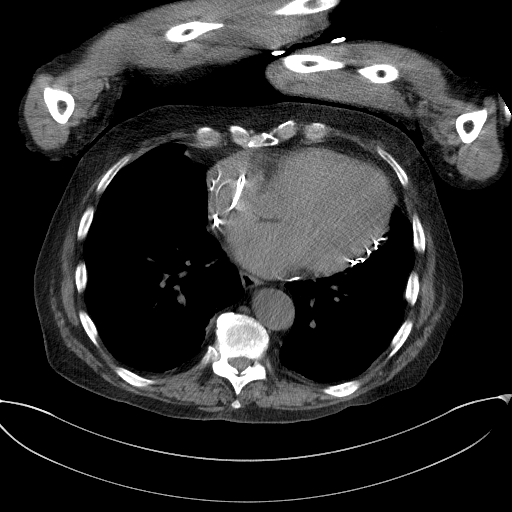

[Series 5: a/p w/o cor · coronal · non-contrast · 0.84mm/px · 3 of 144 slices shown]
[im 48/144  soft-tissue]
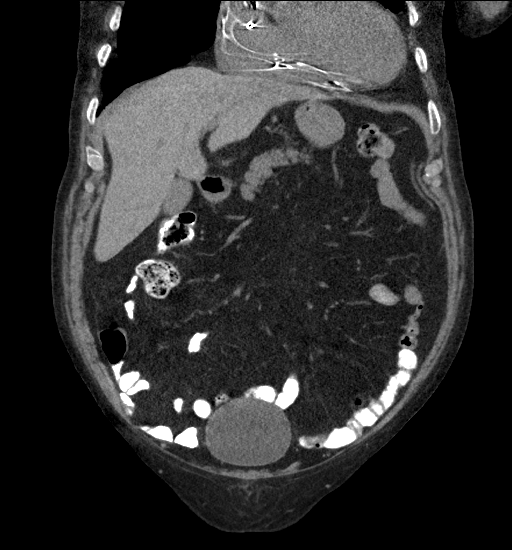
[im 64/144  soft-tissue]
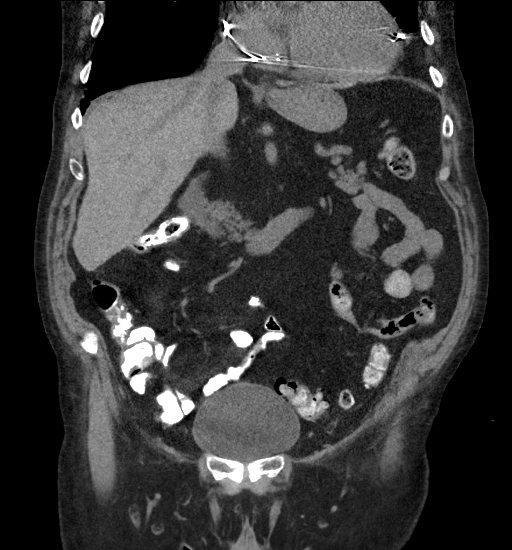
[im 80/144  soft-tissue]
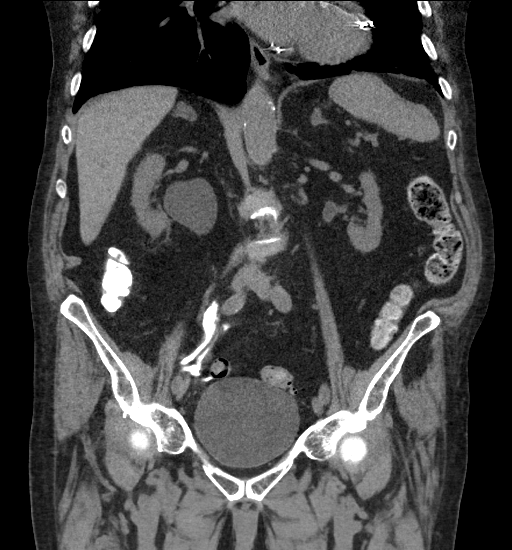

[16 of 46 positions shown; findings below may reference images not displayed]

FINDINGS: Lower chest: Clear lung bases. Mild cardiomegaly with pacer. No
pericardial or pleural effusion.

Hepatobiliary: Old granulomatous disease in the liver. Normal
gallbladder, without biliary ductal dilatation.

Pancreas: Normal pancreas for age, without duct dilatation or mass.

Spleen: Old granulomatous disease in the spleen.

Adrenals/Urinary Tract: Mild right adrenal thickening. Normal left
adrenal gland. Expected renal cortical thinning bilaterally.
Punctate upper pole right renal collecting system calculus. A right
extrarenal pelvis is similar. Minimal caliectasis is also
identified. (image 26, series 2). No hydroureter or ureteric/
bladder stone. Mild bladder distention.

Stomach/Bowel: Normal remainder of the stomach. Extensive colonic
diverticulosis. Normal colon and terminal ileum. Normal small bowel.

Vascular/Lymphatic: Aortic and branch vessel atherosclerosis. No
abdominopelvic adenopathy.

Reproductive: Mild prostatomegaly.

Other: No significant free fluid.

Musculoskeletal: Tarlov cysts. Advanced lumbosacral spondylosis with
trace L3-4 anterolisthesis. Convex left lumbar spine curvature.
IMPRESSION: 1. Right nephrolithiasis. Right extrarenal pelvis with minimal new
right-sided caliectasis. This could be physiologic and related to
bladder distention. Considerations include prior recent stone
passage.
2. No other explanation for pain.

## 2016-03-04 NOTE — Progress Notes (Signed)
ICM remote transmission rescheduled from 03/18/2016 to 03/22/2016

## 2016-03-08 ENCOUNTER — Encounter: Payer: Self-pay | Admitting: Cardiology

## 2016-03-08 ENCOUNTER — Telehealth: Payer: Self-pay

## 2016-03-08 NOTE — Telephone Encounter (Signed)
Received call from son, Ronalee Belts.  He stated patient has been sick with cold.  He has shortness of breath and weight gain of 4 lbs in the last few days to a week.  He thought patient might have fluid overload.  Patient is unable to send a manual remote transmission at this time.  Confirmed patient has been taking Furosemide 80 mg in am and 40 mg pm.  Advised will check with Dr Marlou Porch for recommendations.  Last Creatinine 1.7 on 11/2015.  Son's call back number is [phone].

## 2016-03-08 NOTE — Telephone Encounter (Signed)
The skipped extra beat is likely a PVC which is benign. Continue with current care. I do not think that there is any additional treatment necessary from my end. Candee Furbish, MD

## 2016-03-08 NOTE — Telephone Encounter (Signed)
Subject: Non-Urgent Medical Question             My daddy went to his visit with Dr. Carlota Raspberry last week and they took him off the gout prevention medicine due to some of daddy's complaints.  Did EKG and said a small extra beat showing and would follow up with your office. He had a stomach virus Friday and now a bad cold. He has been taking musinex and he has gained 4 pounds since the stomach virus. His breathing is labored some. He has been on oxygen at night but is using it some during the day today. Any thing that we need to do in addition to this?   The above is copied from a patient advise request.  Will review with Dr Marlou Porch and c/b with further instructions

## 2016-03-08 NOTE — Telephone Encounter (Signed)
Reviewed with son, Dr Marlou Porch suggestion to not make anychanges at this time.  Requested pt continue medications as listed, weigh daily, monitor (decrease) the amount of NA intake and to c/b with any worsening edema, bloating, SOB etc.  Son stated understanding and thanked me for the call back.

## 2016-03-11 ENCOUNTER — Telehealth: Payer: Self-pay | Admitting: Physician Assistant

## 2016-03-11 ENCOUNTER — Other Ambulatory Visit: Payer: Self-pay | Admitting: Physician Assistant

## 2016-03-11 DIAGNOSIS — I5022 Chronic systolic (congestive) heart failure: Secondary | ICD-10-CM

## 2016-03-11 NOTE — Telephone Encounter (Signed)
Pt states her father is having cough/cold sx and is SOB.  What to do? His weight is up 5-7 lbs. He does not want to go to the ER. Advised her that he could take extra Lasix qpm for a few days. He could go from Lasix 80 mg am and 40 mg pm to 80 mg bid for a few days.  Will send msg to the office for him to get a call early next week for extender visit and BMET. Ok to take Mucinex as well.  If any questions or concerns, bring him to the ER.  Rosaria Ferries, Hershal Coria 03/11/2016 4:16 PM Beeper [phone]

## 2016-03-13 ENCOUNTER — Telehealth: Payer: Self-pay | Admitting: Cardiology

## 2016-03-13 NOTE — Telephone Encounter (Signed)
Received a page from answering service. Number given was disconnected. Attempted to call numbers placed in chart without success.

## 2016-03-14 ENCOUNTER — Encounter: Payer: Self-pay | Admitting: Cardiology

## 2016-03-15 ENCOUNTER — Encounter: Payer: Self-pay | Admitting: Cardiology

## 2016-03-15 ENCOUNTER — Ambulatory Visit (INDEPENDENT_AMBULATORY_CARE_PROVIDER_SITE_OTHER): Payer: Medicare Other | Admitting: Cardiology

## 2016-03-15 ENCOUNTER — Encounter (HOSPITAL_COMMUNITY): Payer: Self-pay | Admitting: General Practice

## 2016-03-15 ENCOUNTER — Inpatient Hospital Stay (HOSPITAL_COMMUNITY)
Admission: AD | Admit: 2016-03-15 | Discharge: 2016-03-20 | DRG: 291 | Disposition: A | Discharge status: Home / Self Care | Payer: Medicare Other | Source: Ambulatory Visit | Attending: Cardiology | Admitting: Cardiology

## 2016-03-15 VITALS — BP 128/70 | HR 109 | Ht 70.0 in | Wt 190.8 lb

## 2016-03-15 DIAGNOSIS — R0600 Dyspnea, unspecified: Secondary | ICD-10-CM | POA: Diagnosis not present

## 2016-03-15 DIAGNOSIS — I480 Paroxysmal atrial fibrillation: Secondary | ICD-10-CM | POA: Diagnosis present

## 2016-03-15 DIAGNOSIS — Z950 Presence of cardiac pacemaker: Secondary | ICD-10-CM

## 2016-03-15 DIAGNOSIS — Z9841 Cataract extraction status, right eye: Secondary | ICD-10-CM | POA: Diagnosis not present

## 2016-03-15 DIAGNOSIS — I495 Sick sinus syndrome: Secondary | ICD-10-CM

## 2016-03-15 DIAGNOSIS — I452 Bifascicular block: Secondary | ICD-10-CM | POA: Diagnosis present

## 2016-03-15 DIAGNOSIS — I5022 Chronic systolic (congestive) heart failure: Secondary | ICD-10-CM

## 2016-03-15 DIAGNOSIS — R0602 Shortness of breath: Secondary | ICD-10-CM | POA: Diagnosis present

## 2016-03-15 DIAGNOSIS — I130 Hypertensive heart and chronic kidney disease with heart failure and stage 1 through stage 4 chronic kidney disease, or unspecified chronic kidney disease: Secondary | ICD-10-CM | POA: Diagnosis present

## 2016-03-15 DIAGNOSIS — I429 Cardiomyopathy, unspecified: Secondary | ICD-10-CM

## 2016-03-15 DIAGNOSIS — I4891 Unspecified atrial fibrillation: Secondary | ICD-10-CM | POA: Diagnosis present

## 2016-03-15 DIAGNOSIS — I083 Combined rheumatic disorders of mitral, aortic and tricuspid valves: Secondary | ICD-10-CM | POA: Diagnosis present

## 2016-03-15 DIAGNOSIS — J438 Other emphysema: Secondary | ICD-10-CM | POA: Diagnosis not present

## 2016-03-15 DIAGNOSIS — Z7952 Long term (current) use of systemic steroids: Secondary | ICD-10-CM

## 2016-03-15 DIAGNOSIS — Z801 Family history of malignant neoplasm of trachea, bronchus and lung: Secondary | ICD-10-CM

## 2016-03-15 DIAGNOSIS — Z9981 Dependence on supplemental oxygen: Secondary | ICD-10-CM

## 2016-03-15 DIAGNOSIS — I255 Ischemic cardiomyopathy: Secondary | ICD-10-CM | POA: Diagnosis not present

## 2016-03-15 DIAGNOSIS — I2510 Atherosclerotic heart disease of native coronary artery without angina pectoris: Secondary | ICD-10-CM | POA: Diagnosis present

## 2016-03-15 DIAGNOSIS — J449 Chronic obstructive pulmonary disease, unspecified: Secondary | ICD-10-CM | POA: Diagnosis present

## 2016-03-15 DIAGNOSIS — J9600 Acute respiratory failure, unspecified whether with hypoxia or hypercapnia: Secondary | ICD-10-CM | POA: Diagnosis present

## 2016-03-15 DIAGNOSIS — R0609 Other forms of dyspnea: Secondary | ICD-10-CM

## 2016-03-15 DIAGNOSIS — Z9229 Personal history of other drug therapy: Secondary | ICD-10-CM

## 2016-03-15 DIAGNOSIS — K219 Gastro-esophageal reflux disease without esophagitis: Secondary | ICD-10-CM | POA: Diagnosis present

## 2016-03-15 DIAGNOSIS — I5023 Acute on chronic systolic (congestive) heart failure: Secondary | ICD-10-CM | POA: Diagnosis not present

## 2016-03-15 DIAGNOSIS — N184 Chronic kidney disease, stage 4 (severe): Secondary | ICD-10-CM | POA: Diagnosis present

## 2016-03-15 DIAGNOSIS — Z961 Presence of intraocular lens: Secondary | ICD-10-CM | POA: Diagnosis present

## 2016-03-15 DIAGNOSIS — I340 Nonrheumatic mitral (valve) insufficiency: Secondary | ICD-10-CM | POA: Diagnosis present

## 2016-03-15 DIAGNOSIS — E785 Hyperlipidemia, unspecified: Secondary | ICD-10-CM | POA: Diagnosis present

## 2016-03-15 DIAGNOSIS — J189 Pneumonia, unspecified organism: Secondary | ICD-10-CM

## 2016-03-15 DIAGNOSIS — E039 Hypothyroidism, unspecified: Secondary | ICD-10-CM | POA: Diagnosis present

## 2016-03-15 DIAGNOSIS — Z9842 Cataract extraction status, left eye: Secondary | ICD-10-CM

## 2016-03-15 DIAGNOSIS — Z7901 Long term (current) use of anticoagulants: Secondary | ICD-10-CM

## 2016-03-15 DIAGNOSIS — Z8249 Family history of ischemic heart disease and other diseases of the circulatory system: Secondary | ICD-10-CM | POA: Diagnosis not present

## 2016-03-15 HISTORY — DX: Dependence on supplemental oxygen: Z99.81

## 2016-03-15 HISTORY — DX: Unspecified malignant neoplasm of skin, unspecified: C44.90

## 2016-03-15 LAB — CBC WITH DIFFERENTIAL/PLATELET
Basophils Absolute: 0 10*3/uL (ref 0.0–0.1)
Basophils Relative: 0 %
Eosinophils Absolute: 0 10*3/uL (ref 0.0–0.7)
Eosinophils Relative: 0 %
HEMATOCRIT: 35.1 % — AB (ref 39.0–52.0)
HEMOGLOBIN: 11.7 g/dL — AB (ref 13.0–17.0)
LYMPHS ABS: 1.6 10*3/uL (ref 0.7–4.0)
LYMPHS PCT: 17 %
MCH: 31.1 pg (ref 26.0–34.0)
MCHC: 33.3 g/dL (ref 30.0–36.0)
MCV: 93.4 fL (ref 78.0–100.0)
MONO ABS: 0.7 10*3/uL (ref 0.1–1.0)
MONOS PCT: 8 %
NEUTROS ABS: 7.4 10*3/uL (ref 1.7–7.7)
NEUTROS PCT: 75 %
PLATELETS: 351 10*3/uL (ref 150–400)
RBC: 3.76 MIL/uL — ABNORMAL LOW (ref 4.22–5.81)
RDW: 14.6 % (ref 11.5–15.5)
WBC: 9.8 10*3/uL (ref 4.0–10.5)

## 2016-03-15 LAB — PROTIME-INR
INR: 1.25
Prothrombin Time: 15.8 seconds — ABNORMAL HIGH (ref 11.4–15.2)

## 2016-03-15 LAB — COMPREHENSIVE METABOLIC PANEL
ALT: 19 U/L (ref 17–63)
AST: 19 U/L (ref 15–41)
Albumin: 3.1 g/dL — ABNORMAL LOW (ref 3.5–5.0)
Alkaline Phosphatase: 57 U/L (ref 38–126)
Anion gap: 10 (ref 5–15)
BUN: 32 mg/dL — AB (ref 6–20)
CHLORIDE: 106 mmol/L (ref 101–111)
CO2: 21 mmol/L — AB (ref 22–32)
Calcium: 8.7 mg/dL — ABNORMAL LOW (ref 8.9–10.3)
Creatinine, Ser: 1.87 mg/dL — ABNORMAL HIGH (ref 0.61–1.24)
GFR calc non Af Amer: 29 mL/min — ABNORMAL LOW (ref >60)
GFR, EST AFRICAN AMERICAN: 33 mL/min — AB (ref >60)
Glucose, Bld: 112 mg/dL — ABNORMAL HIGH (ref 65–99)
Potassium: 4.1 mmol/L (ref 3.5–5.1)
SODIUM: 137 mmol/L (ref 135–145)
Total Bilirubin: 0.8 mg/dL (ref 0.3–1.2)
Total Protein: 6.1 g/dL — ABNORMAL LOW (ref 6.5–8.1)

## 2016-03-15 LAB — MAGNESIUM: Magnesium: 2.3 mg/dL (ref 1.7–2.4)

## 2016-03-15 LAB — TSH: TSH: 2.055 u[IU]/mL (ref 0.350–4.500)

## 2016-03-15 LAB — TROPONIN I
TROPONIN I: 0.08 ng/mL — AB (ref <0.03)
Troponin I: 0.11 ng/mL (ref <0.03)

## 2016-03-15 LAB — BRAIN NATRIURETIC PEPTIDE: B NATRIURETIC PEPTIDE 5: 1097.9 pg/mL — AB (ref 0.0–100.0)

## 2016-03-15 MED ORDER — FUROSEMIDE 10 MG/ML IJ SOLN
80.0000 mg | Freq: Two times a day (BID) | INTRAMUSCULAR | Status: DC
  Administered 2016-03-15 – 2016-03-19 (×8): 80 mg via INTRAVENOUS
  Filled 2016-03-15 (×8): qty 8

## 2016-03-15 MED ORDER — SODIUM CHLORIDE 0.9% FLUSH: 3.0000 mL | INTRAVENOUS | Status: DC | PRN

## 2016-03-15 MED ORDER — SODIUM CHLORIDE 0.9% FLUSH
3.0000 mL | Freq: Two times a day (BID) | INTRAVENOUS | Status: DC
  Administered 2016-03-15 – 2016-03-20 (×10): 3 mL via INTRAVENOUS

## 2016-03-15 MED ORDER — ACETAMINOPHEN 325 MG PO TABS: 650.0000 mg | ORAL_TABLET | ORAL | Status: DC | PRN

## 2016-03-15 MED ORDER — SODIUM CHLORIDE 0.9 % IV SOLN: 250.0000 mL | INTRAVENOUS | Status: DC | PRN

## 2016-03-15 MED ORDER — PANTOPRAZOLE SODIUM 40 MG PO TBEC
40.0000 mg | DELAYED_RELEASE_TABLET | Freq: Every day | ORAL | Status: DC
  Administered 2016-03-16 – 2016-03-20 (×5): 40 mg via ORAL
  Filled 2016-03-15 (×5): qty 1

## 2016-03-15 MED ORDER — GABAPENTIN 300 MG PO CAPS
300.0000 mg | ORAL_CAPSULE | Freq: Two times a day (BID) | ORAL | Status: DC
  Administered 2016-03-15 – 2016-03-20 (×10): 300 mg via ORAL
  Filled 2016-03-15 (×10): qty 1

## 2016-03-15 MED ORDER — MORPHINE SULFATE 15 MG PO TABS: 15.0000 mg | ORAL_TABLET | Freq: Four times a day (QID) | ORAL | Status: DC | PRN

## 2016-03-15 MED ORDER — FENOFIBRATE 54 MG PO TABS
54.0000 mg | ORAL_TABLET | Freq: Every day | ORAL | Status: DC
  Administered 2016-03-16 – 2016-03-20 (×6): 54 mg via ORAL
  Filled 2016-03-15 (×7): qty 1

## 2016-03-15 MED ORDER — PREDNISONE 5 MG PO TABS
5.0000 mg | ORAL_TABLET | Freq: Every day | ORAL | Status: DC
  Administered 2016-03-16 – 2016-03-20 (×5): 5 mg via ORAL
  Filled 2016-03-15 (×5): qty 1

## 2016-03-15 MED ORDER — TAMSULOSIN HCL 0.4 MG PO CAPS
0.4000 mg | ORAL_CAPSULE | Freq: Two times a day (BID) | ORAL | Status: DC
  Administered 2016-03-15 – 2016-03-20 (×10): 0.4 mg via ORAL
  Filled 2016-03-15 (×11): qty 1

## 2016-03-15 MED ORDER — OMEGA-3 FATTY ACIDS 1000 MG PO CAPS: 2.0000 g | ORAL_CAPSULE | Freq: Every day | ORAL | Status: DC

## 2016-03-15 MED ORDER — APIXABAN 2.5 MG PO TABS
2.5000 mg | ORAL_TABLET | Freq: Two times a day (BID) | ORAL | Status: DC
  Administered 2016-03-15 – 2016-03-20 (×10): 2.5 mg via ORAL
  Filled 2016-03-15 (×10): qty 1

## 2016-03-15 MED ORDER — COLCHICINE 0.6 MG PO TABS
0.6000 mg | ORAL_TABLET | Freq: Two times a day (BID) | ORAL | Status: DC
  Administered 2016-03-16 (×2): 0.6 mg via ORAL
  Filled 2016-03-15 (×2): qty 1

## 2016-03-15 MED ORDER — ONDANSETRON HCL 4 MG/2ML IJ SOLN: 4.0000 mg | Freq: Four times a day (QID) | INTRAMUSCULAR | Status: DC | PRN

## 2016-03-15 MED ORDER — GUAIFENESIN ER 600 MG PO TB12: 600.0000 mg | ORAL_TABLET | Freq: Two times a day (BID) | ORAL | Status: DC | PRN

## 2016-03-15 MED ORDER — CARVEDILOL 3.125 MG PO TABS
3.1250 mg | ORAL_TABLET | Freq: Every day | ORAL | Status: DC
  Administered 2016-03-15: 3.125 mg via ORAL
  Filled 2016-03-15: qty 1

## 2016-03-15 MED ORDER — LEVOTHYROXINE SODIUM 75 MCG PO TABS
75.0000 ug | ORAL_TABLET | Freq: Every day | ORAL | Status: DC
  Administered 2016-03-16 – 2016-03-20 (×5): 75 ug via ORAL
  Filled 2016-03-15 (×5): qty 1

## 2016-03-15 MED ORDER — TRIAZOLAM 0.125 MG PO TABS
0.1250 mg | ORAL_TABLET | Freq: Every evening | ORAL | Status: DC | PRN
  Administered 2016-03-15 – 2016-03-19 (×5): 0.125 mg via ORAL
  Filled 2016-03-15 (×5): qty 1

## 2016-03-15 NOTE — Progress Notes (Signed)
ANTICOAGULATION CONSULT NOTE - Initial Consult  Pharmacy Consult for Eliquis Indication: atrial fibrillation  No Known Allergies  Patient Measurements: Height: 5\' 10"  (177.8 cm) Weight: 186 lb 12.8 oz (84.7 kg) IBW/kg (Calculated) : 73  Vital Signs: Temp: 97.9 F (36.6 C) (11/28 1617) Temp Source: Oral (11/28 1617) BP: 116/76 (11/28 1617) Pulse Rate: 93 (11/28 1617)  Labs: No results for input(s): HGB, HCT, PLT, APTT, LABPROT, INR, HEPARINUNFRC, HEPRLOWMOCWT, CREATININE, CKTOTAL, CKMB, TROPONINI in the last 72 hours.  CrCl cannot be calculated (Patient's most recent lab result is older than the maximum 21 days allowed.).   Medical History: Past Medical History:  Diagnosis Date  . Aortic insufficiency   . Arthritis    "back, knees, right leg" (10/20/2015)  . Atrial fibrillation ([place])   . CAD (coronary artery disease)   . Cardiomyopathy, EF by echo 25-30% 07/14/2013  . CHF (congestive heart failure) ([place])   . Chronic lower back pain   . COPD (chronic obstructive pulmonary disease) ([place])   . Dyslipidemia   . GERD (gastroesophageal reflux disease)   . Gout   . Hypertension   . Hypothyroidism   . Left bundle branch block   . Lung nodules   . Mitral regurgitation   . Pacemaker   . Pneumonia    "2-3 times" (10/20/2015)  . RBBB   . Shortness of breath   . Shoulder pain     Medications:  Scheduled:  . apixaban  2.5 mg Oral BID  . carvedilol  3.125 mg Oral QHS  . colchicine  0.6 mg Oral BID  . fenofibrate  54 mg Oral Daily  . fish oil-omega-3 fatty acids  2 g Oral Daily  . furosemide  80 mg Intravenous Q12H  . gabapentin  300 mg Oral BID  . levothyroxine  75 mcg Oral Daily  . pantoprazole  40 mg Oral Daily  . predniSONE  5 mg Oral Daily  . sodium chloride flush  3 mL Intravenous Q12H  . tamsulosin  0.4 mg Oral BID    Assessment: 82 yo M seen at [facility] MD with cough and cold sx, significant SOB, and 8lb weight gain despite an increase in Lasix dosing.  Transported  to [facility] for admission.  On Eliquis 2.5mg  BID PTA.  Goal of Therapy:  therapeutic anticoagulation Monitor platelets by anticoagulation protocol: Yes   Plan:  Continue Eliquis 2.5mg  BID - dose appropriate based on age and last reported SCr of 1.7 Monitor for signs and symptoms of bleed.  [employer], Pharm.D., BCPS Clinical Pharmacist Pager [phone] 03/15/2016 4:48 PM

## 2016-03-15 NOTE — Progress Notes (Signed)
Cardiology Office Note   Date:  03/15/2016   ID:  Levi Lewis, DOB Dec 16, 1917, MRN [id number]  PCP:  Criselda Peaches, MD  Cardiologist:  Dr. Marlou Porch    Chief Complaint  Patient presents with  . Shortness of Breath      History of Present Illness: Levi Lewis is a 82 y.o. male who presents for cough and cold sx. And mostly significant SOB. With wt up 5-7 lbs on the weekend and per his home scales 8 lbs today.  He was instructed to take lasix 80 mg BID.  This is increase of dose from 80 AM and 40 pm.  Despite increase he is severely SOB on exertion.  He is on home oxygen and at home his SP02 does drop to 70%.   Was ok'd for mucinex as well.   Recently had stomach virus as well and EKG with extra beats at PCP. Most likely a fib.   He has a history ofatrial fibrillation off of amiodarone in June due to abnormal CXR per Dr. Caryl Comes. He has a Engineer, petroleum for sick sinus syndrome. CRT device.   Cardiomyopathy previous echo showed EF of 25-30%, COPD also noted.  No chest pain and denies any bleeding in his stool. He is best without exertion.  He is having some diarrhea as well.  This is after taking stool softener yesterday.   Does complain of heart racing.    Past Medical History:  Diagnosis Date  . Aortic insufficiency   . Arthritis    "back, knees, right leg" (10/20/2015)  . Atrial fibrillation ([place])   . CAD (coronary artery disease)   . Cardiomyopathy, EF by echo 25-30% 07/14/2013  . CHF (congestive heart failure) ([place])   . Chronic lower back pain   . COPD (chronic obstructive pulmonary disease) ([place])   . Dyslipidemia   . GERD (gastroesophageal reflux disease)   . Gout   . Hypertension   . Hypothyroidism   . Left bundle branch block   . Lung nodules   . Mitral regurgitation   . Pacemaker   . Pneumonia    "2-3 times" (10/20/2015)  . RBBB   . Shortness of breath   . Shoulder pain     Past Surgical History:  Procedure Laterality Date  . BACK SURGERY    .  BI-VENTRICULAR PACEMAKER INSERTION (CRT-P)  07-15-2013   STJ Quadra Allura CRTP implanted by Dr Caryl Comes for NICM, CHF, alternating bundle branch blocks  . CARDIOVERSION N/A 09/13/2013   Procedure: CARDIOVERSION;  Surgeon: Carlena Bjornstad, MD;  Location: [facility] ENDOSCOPY;  Service: Cardiovascular;  Laterality: N/A;  . CARDIOVERSION N/A 10/17/2013   Procedure: CARDIOVERSION;  Surgeon: Lelon Perla, MD;  Location: [facility] ENDOSCOPY;  Service: Cardiovascular;  Laterality: N/A;  . CATARACT EXTRACTION W/ INTRAOCULAR LENS  IMPLANT, BILATERAL Bilateral   . CORONARY ANGIOPLASTY    . INSERT / REPLACE / REMOVE PACEMAKER    . KNEE ARTHROSCOPY Right   . LUMBAR DISC SURGERY    . PERMANENT PACEMAKER INSERTION N/A 07/15/2013   Procedure: PERMANENT PACEMAKER INSERTION;  Surgeon: Deboraha Sprang, MD;  Location: [facility] CATH LAB;  Service: Cardiovascular;  Laterality: N/A;  . SHOULDER ARTHROSCOPY Right   . TONSILLECTOMY       Current Outpatient Prescriptions  Medication Sig Dispense Refill  . carvedilol (COREG) 3.125 MG tablet Take 3.125 mg by mouth at bedtime. Take one tablet (3.125 mg) by mouth every evening    . colchicine 0.6 MG  tablet Take 1 tablet (0.6 mg total) by mouth 2 (two) times daily. 10 tablet 0  . ELIQUIS 2.5 MG TABS tablet TAKE 1 TABLET (2.5 MG TOTAL) BY MOUTH 2 (TWO) TIMES DAILY. 180 tablet 2  . fenofibrate (TRICOR) 48 MG tablet TAKE 1/2 TABLET BY MOUTH DAILY 15 tablet 9  . fish oil-omega-3 fatty acids 1000 MG capsule Take 2 g by mouth daily.     . furosemide (LASIX) 80 MG tablet Take 1 tablet (80 mg total) by mouth daily. 30 tablet 1  . furosemide (LASIX) 80 MG tablet Take 80 mg by mouth 2 (two) times daily.    Marland Kitchen gabapentin (NEURONTIN) 300 MG capsule Take 300 mg by mouth 2 (two) times daily.  12  . guaiFENesin (MUCINEX) 600 MG 12 hr tablet Take 600 mg by mouth 2 (two) times daily as needed for cough or to loosen phlegm (COLDS).     Marland Kitchen levothyroxine (SYNTHROID, LEVOTHROID) 75 MCG tablet Take 1 tablet (75 mcg  total) by mouth daily. 30 tablet 6  . morphine (MSIR) 15 MG tablet Take 1 tablet (15 mg total) by mouth every 6 (six) hours as needed for severe pain. 4 tablet 0  . Multiple Vitamins-Minerals (OCUVITE PO) Take 2 tablets by mouth daily.     Marland Kitchen omeprazole (PRILOSEC) 20 MG capsule Take 20 mg by mouth daily.     . predniSONE (DELTASONE) 10 MG tablet Take 5 mg by mouth daily.  12  . tamsulosin (FLOMAX) 0.4 MG CAPS capsule Take 0.4 mg by mouth 2 (two) times daily.     . triazolam (HALCION) 0.25 MG tablet Take 0.5 tablets (0.125 mg total) by mouth at bedtime as needed for sleep. 15 tablet 0   No current facility-administered medications for this visit.     Allergies:   Patient has no known allergies.    Social History:  The patient  reports that he has never smoked. He has never used smokeless tobacco. He reports that he does not drink alcohol or use drugs.   Family History:  The patient's family history includes Heart attack in his sister; Lung cancer in his brother.    ROS:  General:recent cold no fevers, + weight increase  Skin:no rashes or ulcers HEENT:no blurred vision, no congestion- wears glasses CV:see HPI PUL:see HPI [id number] soft diarrhea yesterday after stool softener, occ constipation or melena, no indigestion GU:no hematuria, no dysuria MS:no joint pain, no claudication Neuro:no syncope, no lightheadedness Endo:no diabetes, + thyroid disease  Wt Readings from Last 3 Encounters:  03/15/16 190 lb 12.8 oz (86.5 kg)  01/15/16 180 lb 12.8 oz (82 kg)  12/09/15 180 lb (81.6 kg)     PHYSICAL EXAM: VS:  BP 128/70   Pulse (!) 109   Ht 5\' 10"  (1.778 m)   Wt 190 lb 12.8 oz (86.5 kg)   SpO2 93%   BMI 27.38 kg/m  , BMI Body mass index is 27.38 kg/m. General:Pleasant affect, NAD Skin:Warm and dry, brisk capillary refill HEENT:normocephalic, sclera clear, mucus membranes moist Neck:supple, no JVD sitting upright, no bruits  Heart:irreg irreg without murmur, gallup, rub or  click Lungs: with rales, no rhonchi, or wheezes [id number], non tender, + BS, do not palpate liver spleen or masses Ext:1-2+ lower ext edema,  2+ radial pulses Neuro:alert and oriented X 3, MAE, follows commands, + facial symmetry    EKG:  EKG is ordered today. The ekg ordered today demonstrates a fib, with RVR at times, pacing at other times.  Recent Labs: 06/22/2015: TSH 1.18 10/18/2015: B Natriuretic Peptide 721.7 10/19/2015: ALT 11 10/21/2015: Magnesium 2.1 12/09/2015: BUN 27; Creatinine, Ser 1.71; Hemoglobin 11.8; Platelets 234; Potassium 3.5; Sodium 142    Lipid Panel    Component Value Date/Time   CHOL 166 09/17/2010 1032   TRIG 224.0 (H) 09/17/2010 1032   HDL 37.40 (L) 09/17/2010 1032   CHOLHDL 4 09/17/2010 1032   VLDL 44.8 (H) 09/17/2010 1032   LDLCALC (H) 08/07/2008 0500    113        Total Cholesterol/HDL:CHD Risk Coronary Heart Disease Risk Table                     Men   Women  1/2 Average Risk   3.4   3.3  Average Risk       5.0   4.4  2 X Average Risk   9.6   7.1  3 X Average Risk  23.4   11.0        Use the calculated Patient Ratio above and the CHD Risk Table to determine the patient's CHD Risk.        ATP III CLASSIFICATION (LDL):  <100     mg/dL   Optimal  100-129  mg/dL   Near or Above                    Optimal  130-159  mg/dL   Borderline  160-189  mg/dL   High  >190     mg/dL   Very High   LDLDIRECT 109.6 09/17/2010 1032       Other studies Reviewed: Additional studies/ records that were reviewed today include: Echo from 2015: Study Conclusions  - Left ventricle: Systolic function was severely reduced. The estimated ejection fraction was in the range of 25% to 30%. When compared to 2010, EF is reduced (45% prior). There is severe hypokinesis of the anteroseptal myocardium. Doppler parameters are consistent with high ventricular filling pressure. - Aortic valve: Mild regurgitation. - Aorta: Aortic root dimension: 65mm (ED). -  Mitral valve: Moderate regurgitation. - Left atrium: The atrium was mildly dilated. - Right atrium: The atrium was mildly dilated. - Atrial septum: No defect or patent foramen ovale was identified. - Tricuspid valve: Moderate regurgitation. - Pulmonary arteries: PA peak pressure: 59mm Hg (S). - Pericardium, extracardiac: There was a left pleural effusion.   ASSESSMENT AND PLAN:  Acute systolic HF - check PCXR , admit to tele, diuresis, check labs.  Dr. Curt Bears has seen.  Will transport by EMS with oxygen needs and his tank is getting low.   A fib with RVR at times new though has hx of  Paroxysmal atrial fibrillation - amiodarone now off  -- due to lung issues - - Pacemaker functioning well - if rate controlled will continue current dose of BB -may have EP see in AM for further suggestions. - CHA2DS2VASc of at lest 4.  On Eliquis, will continue     Chronic anticoagulation - Eliquis 2.5 mg twice a day , dose adjusted because of age, Creatinine 1.7 in past    Pacemaker- ST Jude - functioning well. - Dr. Caryl Comes   Chronic systolic heart failure -  Low-dose carvedilol. Unable to tolerate higher doses because of low blood pressure.   Chronic kidney disease stage IV  - Creatinine 1.7 on 11/2015, lab work recently done by Dr. Nyoka Cowden. He is on Eliquis.    Current medicines are reviewed with the patient today.  The patient Has no concerns regarding medicines.  The following changes have been made:  See above Labs/ tests ordered today include:see above  Disposition:   FU:  see above  Signed, Cecilie Kicks, NP  03/15/2016 3:18 PM    [facility] [place], [place], [place], [place] Phone: [phone]; Fax: [phone]

## 2016-03-15 NOTE — H&P (Addendum)
HISTORY AND PHYSICAL  PCP:  Criselda Peaches, MD        Cardiologist:  Dr. Marlou Porch       Chief Complaint  Patient presents with  . Shortness of Breath      History of Present Illness: Levi Lewis is a 82 y.o. male who presents for cough and cold sx. And mostly significant SOB. With wt up 5-7 lbs on the weekend and per his home scales 8 lbs today.  He was instructed to take lasix 80 mg BID.  This is increase of dose from 80 AM and 40 pm.  Despite increase he is severely SOB on exertion.  He is on home oxygen and at home his SP02 does drop to 70%.   Was ok'd for mucinex as well.   Recently had stomach virus as well and EKG with extra beats at PCP. Most likely a fib.   He has a history ofatrial fibrillation off of amiodarone in June due to abnormal CXR per Dr. Caryl Comes. He has a Engineer, petroleum for sick sinus syndrome. CRT device.   Cardiomyopathy previous echo showed EF of 25-30%, COPD also noted.  No chest pain and denies any bleeding in his stool. He is best without exertion.  He is having some diarrhea as well.  This is after taking stool softener yesterday.   Does complain of heart racing.        Past Medical History:  Diagnosis Date  . Aortic insufficiency   . Arthritis    "back, knees, right leg" (10/20/2015)  . Atrial fibrillation ([place])   . CAD (coronary artery disease)   . Cardiomyopathy, EF by echo 25-30% 07/14/2013  . CHF (congestive heart failure) ([place])   . Chronic lower back pain   . COPD (chronic obstructive pulmonary disease) ([place])   . Dyslipidemia   . GERD (gastroesophageal reflux disease)   . Gout   . Hypertension   . Hypothyroidism   . Left bundle branch block   . Lung nodules   . Mitral regurgitation   . Pacemaker   . Pneumonia    "2-3 times" (10/20/2015)  . RBBB   . Shortness of breath   . Shoulder pain          Past Surgical History:  Procedure Laterality Date  . BACK SURGERY    . BI-VENTRICULAR PACEMAKER INSERTION  (CRT-P)  07-15-2013   STJ Quadra Allura CRTP implanted by Dr Caryl Comes for NICM, CHF, alternating bundle branch blocks  . CARDIOVERSION N/A 09/13/2013   Procedure: CARDIOVERSION;  Surgeon: Carlena Bjornstad, MD;  Location: [facility] ENDOSCOPY;  Service: Cardiovascular;  Laterality: N/A;  . CARDIOVERSION N/A 10/17/2013   Procedure: CARDIOVERSION;  Surgeon: Lelon Perla, MD;  Location: [facility] ENDOSCOPY;  Service: Cardiovascular;  Laterality: N/A;  . CATARACT EXTRACTION W/ INTRAOCULAR LENS  IMPLANT, BILATERAL Bilateral   . CORONARY ANGIOPLASTY    . INSERT / REPLACE / REMOVE PACEMAKER    . KNEE ARTHROSCOPY Right   . LUMBAR DISC SURGERY    . PERMANENT PACEMAKER INSERTION N/A 07/15/2013   Procedure: PERMANENT PACEMAKER INSERTION;  Surgeon: Deboraha Sprang, MD;  Location[facility] CATH LAB;  Service: Cardiovascular;  Laterality: N/A;  . SHOULDER ARTHROSCOPY Right   . TONSILLECTOMY             Current Outpatient Prescriptions  Medication Sig Dispense Refill  . carvedilol (COREG) 3.125 MG tablet Take 3.125 mg by mouth at bedtime. Take one tablet (3.125 mg) by mouth  every evening    . colchicine 0.6 MG tablet Take 1 tablet (0.6 mg total) by mouth 2 (two) times daily. 10 tablet 0  . ELIQUIS 2.5 MG TABS tablet TAKE 1 TABLET (2.5 MG TOTAL) BY MOUTH 2 (TWO) TIMES DAILY. 180 tablet 2  . fenofibrate (TRICOR) 48 MG tablet TAKE 1/2 TABLET BY MOUTH DAILY 15 tablet 9  . fish oil-omega-3 fatty acids 1000 MG capsule Take 2 g by mouth daily.     . furosemide (LASIX) 80 MG tablet Take 1 tablet (80 mg total) by mouth daily. 30 tablet 1  . furosemide (LASIX) 80 MG tablet Take 80 mg by mouth 2 (two) times daily.    Marland Kitchen gabapentin (NEURONTIN) 300 MG capsule Take 300 mg by mouth 2 (two) times daily.  12  . guaiFENesin (MUCINEX) 600 MG 12 hr tablet Take 600 mg by mouth 2 (two) times daily as needed for cough or to loosen phlegm (COLDS).     Marland Kitchen levothyroxine (SYNTHROID, LEVOTHROID) 75 MCG tablet Take 1 tablet (75 mcg  total) by mouth daily. 30 tablet 6  . morphine (MSIR) 15 MG tablet Take 1 tablet (15 mg total) by mouth every 6 (six) hours as needed for severe pain. 4 tablet 0  . Multiple Vitamins-Minerals (OCUVITE PO) Take 2 tablets by mouth daily.     Marland Kitchen omeprazole (PRILOSEC) 20 MG capsule Take 20 mg by mouth daily.     . predniSONE (DELTASONE) 10 MG tablet Take 5 mg by mouth daily.  12  . tamsulosin (FLOMAX) 0.4 MG CAPS capsule Take 0.4 mg by mouth 2 (two) times daily.     . triazolam (HALCION) 0.25 MG tablet Take 0.5 tablets (0.125 mg total) by mouth at bedtime as needed for sleep. 15 tablet 0   No current facility-administered medications for this visit.     Allergies:   Patient has no known allergies.    Social History:  The patient  reports that he has never smoked. He has never used smokeless tobacco. He reports that he does not drink alcohol or use drugs.   Family History:  The patient's family history includes Heart attack in his sister; Lung cancer in his brother.    ROS:  General:recent cold no fevers, + weight increase  Skin:no rashes or ulcers HEENT:no blurred vision, no congestion- wears glasses CV:see HPI PUL:see HPI [id number] soft diarrhea yesterday after stool softener, occ constipation or melena, no indigestion GU:no hematuria, no dysuria MS:no joint pain, no claudication Neuro:no syncope, no lightheadedness Endo:no diabetes, + thyroid disease     Wt Readings from Last 3 Encounters:  03/15/16 190 lb 12.8 oz (86.5 kg)  01/15/16 180 lb 12.8 oz (82 kg)  12/09/15 180 lb (81.6 kg)     PHYSICAL EXAM: VS:  BP 128/70   Pulse (!) 109   Ht 5\' 10"  (1.778 m)   Wt 190 lb 12.8 oz (86.5 kg)   SpO2 93%   BMI 27.38 kg/m  , BMI Body mass index is 27.38 kg/m. General:Pleasant affect, NAD Skin:Warm and dry, brisk capillary refill HEENT:normocephalic, sclera clear, mucus membranes moist Neck:supple, no JVD sitting upright, no bruits  Heart:irreg irreg without murmur,  gallup, rub or click Lungs: with rales, no rhonchi, or wheezes [id number], non tender, + BS, do not palpate liver spleen or masses Ext:1-2+ lower ext edema,  2+ radial pulses Neuro:alert and oriented X 3, MAE, follows commands, + facial symmetry    EKG:  EKG is ordered today. The ekg ordered today  demonstrates a fib, with RVR at times, pacing at other times.     Recent Labs: 06/22/2015: TSH 1.18 10/18/2015: B Natriuretic Peptide 721.7 10/19/2015: ALT 11 10/21/2015: Magnesium 2.1 12/09/2015: BUN 27; Creatinine, Ser 1.71; Hemoglobin 11.8; Platelets 234; Potassium 3.5; Sodium 142    Lipid Panel Labs (Brief)           Component Value Date/Time   CHOL 166 09/17/2010 1032   TRIG 224.0 (H) 09/17/2010 1032   HDL 37.40 (L) 09/17/2010 1032   CHOLHDL 4 09/17/2010 1032   VLDL 44.8 (H) 09/17/2010 1032   LDLCALC (H) 08/07/2008 0500    113        Total Cholesterol/HDL:CHD Risk Coronary Heart Disease Risk Table                     Men   Women  1/2 Average Risk   3.4   3.3  Average Risk       5.0   4.4  2 X Average Risk   9.6   7.1  3 X Average Risk  23.4   11.0        Use the calculated Patient Ratio above and the CHD Risk Table to determine the patient's CHD Risk.        ATP III CLASSIFICATION (LDL):  <100     mg/dL   Optimal  100-129  mg/dL   Near or Above                    Optimal  130-159  mg/dL   Borderline  160-189  mg/dL   High  >190     mg/dL   Very High   LDLDIRECT 109.6 09/17/2010 1032         Other studies Reviewed: Additional studies/ records that were reviewed today include: Echo from 2015: Study Conclusions  - Left ventricle: Systolic function was severely reduced. The estimated ejection fraction was in the range of 25% to 30%. When compared to 2010, EF is reduced (45% prior). There is severe hypokinesis of the anteroseptal myocardium. Doppler parameters are consistent with high ventricular filling pressure. - Aortic valve: Mild  regurgitation. - Aorta: Aortic root dimension: 61mm (ED). - Mitral valve: Moderate regurgitation. - Left atrium: The atrium was mildly dilated. - Right atrium: The atrium was mildly dilated. - Atrial septum: No defect or patent foramen ovale was identified. - Tricuspid valve: Moderate regurgitation. - Pulmonary arteries: PA peak pressure: 58mm Hg (S). - Pericardium, extracardiac: There was a left pleural effusion.   ASSESSMENT AND PLAN:  Acute systolic HF with associated acute respiratory failure- check PCXR , admit to tele, diuresis, check labs.  Dr. Curt Bears has seen.  Levi Lewis transport by EMS with oxygen needs and his tank is getting low.   A fib with RVR at times new though has hx of  Paroxysmal atrial fibrillation - amiodarone now off -- due to lung issues- -Pacemaker functioning well - if rate controlled Levi Lewis continue current dose of BB -may have EP see in AM for further suggestions. - CHA2DS2VASc of at lest 4.  On Eliquis, Levi Lewis continue    Chronic anticoagulation - Eliquis 2.5 mg twice a day , dose adjusted because of age, Creatinine 1.7 in past   Pacemaker- ST Jude - functioning well. - Dr. Caryl Comes  Chronic systolic heart failure - Low-dose carvedilol. Unable to tolerate higher doses because of low blood pressure.   Chronic kidney disease stage IV - Creatinine 1.7 on 11/2015, lab  work recently done by Dr. Nyoka Cowden. He is on Eliquis.  COPD  Please note attempted to discuss code status but family preferred to wait until daughter arrived.    Current medicines are reviewed with the patient today.  The patient Has no concerns regarding medicines.  The following changes have been made:  See above Labs/ tests ordered today include:see above  Disposition:   FU:  see above  Signed, Cecilie Kicks, NP  03/15/2016 3:18 PM    [facility] [address], [place], [place],  [place] Phone: [phone]; Fax: [phone]

## 2016-03-15 NOTE — Progress Notes (Signed)
CRITICAL VALUE ALERT  Critical value received:  Troponin 0.08  Date of notification:  03/15/16  Time of notification:  [id number]  Critical value read back:yes  Nurse who received alert:  Honor Loh Rn   MD notified (1st page):  Ignacia Bayley PA via text page  Time of first page:  [phone]

## 2016-03-16 ENCOUNTER — Inpatient Hospital Stay (HOSPITAL_COMMUNITY): Payer: Medicare Other

## 2016-03-16 ENCOUNTER — Other Ambulatory Visit (HOSPITAL_COMMUNITY): Payer: Medicare Other

## 2016-03-16 DIAGNOSIS — I5023 Acute on chronic systolic (congestive) heart failure: Secondary | ICD-10-CM

## 2016-03-16 LAB — BASIC METABOLIC PANEL
ANION GAP: 9 (ref 5–15)
BUN: 29 mg/dL — ABNORMAL HIGH (ref 6–20)
CHLORIDE: 109 mmol/L (ref 101–111)
CO2: 24 mmol/L (ref 22–32)
Calcium: 8.4 mg/dL — ABNORMAL LOW (ref 8.9–10.3)
Creatinine, Ser: 1.79 mg/dL — ABNORMAL HIGH (ref 0.61–1.24)
GFR calc non Af Amer: 30 mL/min — ABNORMAL LOW (ref >60)
GFR, EST AFRICAN AMERICAN: 35 mL/min — AB (ref >60)
GLUCOSE: 112 mg/dL — AB (ref 65–99)
POTASSIUM: 3.8 mmol/L (ref 3.5–5.1)
Sodium: 142 mmol/L (ref 135–145)

## 2016-03-16 LAB — TROPONIN I: Troponin I: 0.09 ng/mL (ref <0.03)

## 2016-03-16 MED ORDER — COLCHICINE 0.6 MG PO TABS
0.3000 mg | ORAL_TABLET | Freq: Every day | ORAL | Status: DC
  Administered 2016-03-17 – 2016-03-20 (×4): 0.3 mg via ORAL
  Filled 2016-03-16 (×4): qty 1

## 2016-03-16 MED ORDER — CARVEDILOL 6.25 MG PO TABS
6.2500 mg | ORAL_TABLET | Freq: Every day | ORAL | Status: DC
  Administered 2016-03-16 – 2016-03-19 (×4): 6.25 mg via ORAL
  Filled 2016-03-16 (×4): qty 1

## 2016-03-16 NOTE — Progress Notes (Signed)
Patient Name: Levi Lewis Date of Encounter: 03/16/2016  Primary Cardiologist: Dr [facility]idenberg [facility] Problem List     Principal Problem:   Acute on chronic systolic CHF (congestive heart failure) (HCC) Active Problems:   CAD (coronary artery disease)   Dyslipidemia   COPD (chronic obstructive pulmonary disease) (HCC)   Atrial fibrillation (HCC)   Alternating (bilateral) bundle branch block   CKD (chronic kidney disease) stage 4, GFR 15-29 ml/min (HCC)   Cardiomyopathy, EF by echo 25-30%   Pacemaker   Chronic systolic CHF (congestive heart failure) (HCC)   HX: anticoagulation   Acute on chronic systolic HF (heart failure) (HCC)     Subjective   No chest pain; dyspnea improving  Inpatient Medications    Scheduled Meds: . apixaban  2.5 mg Oral BID  . carvedilol  3.125 mg Oral QHS  . colchicine  0.6 mg Oral BID  . fenofibrate  54 mg Oral Daily  . furosemide  80 mg Intravenous Q12H  . gabapentin  300 mg Oral BID  . levothyroxine  75 mcg Oral QAC breakfast  . pantoprazole  40 mg Oral Daily  . predniSONE  5 mg Oral Daily  . sodium chloride flush  3 mL Intravenous Q12H  . tamsulosin  0.4 mg Oral BID   Continuous Infusions:  PRN Meds: sodium chloride, acetaminophen, guaiFENesin, ondansetron (ZOFRAN) IV, sodium chloride flush, triazolam   Vital Signs    Vitals:   03/15/16 1617 03/15/16 2119 03/16/16 0613  BP: 116/76 (!) 111/48 117/80  Pulse: 93 99 74  Resp: 18 18 18   Temp: 97.9 F (36.6 C) 98 F (36.7 C) 97.7 F (36.5 C)  TempSrc: Oral Oral Oral  SpO2: 93% 93% 93%  Weight: 186 lb 12.8 oz (84.7 kg)  182 lb 11.2 oz (82.9 kg)  Height: 5\' 10"  (1.778 m)      Intake/Output Summary (Last 24 hours) at 03/16/16 1021 Last data filed at 03/16/16 0600  Gross per 24 hour  Intake              240 ml  Output             2550 ml  Net            -2310 ml   Filed Weights   03/15/16 1617 03/16/16 0613  Weight: 186 lb 12.8 oz (84.7 kg) 182 lb 11.2 oz (82.9 kg)     Physical Exam   GEN: Well nourished, well developed, in no acute distress.  HEENT: Grossly normal.  Neck: Supple Cardiac: RRR; no peripheral edema Respiratory: Diminished BS bases GI: Soft, nontender, nondistended MS: no deformity or atrophy. Skin: warm and dry, no rash. Neuro:  Strength and sensation are intact. Psych: AAOx3.  Normal affect.  Labs    CBC  Recent Labs  03/15/16 1630  WBC 9.8  NEUTROABS 7.4  HGB 11.7*  HCT 35.1*  MCV 93.4  PLT [id number]   Basic Metabolic Panel  Recent Labs  03/15/16 1630 03/16/16 0511  NA 137 142  K 4.1 3.8  CL 106 109  CO2 21* 24  GLUCOSE 112* 112*  BUN 32* 29*  CREATININE 1.87* 1.79*  CALCIUM 8.7* 8.4*  MG 2.3  --    Liver Function Tests  Recent Labs  03/15/16 1630  AST 19  ALT 19  ALKPHOS 57  BILITOT 0.8  PROT 6.1*  ALBUMIN 3.1*   Cardiac Enzymes  Recent Labs  03/15/16 1630 03/15/16 2147 03/16/16 0511  TROPONINI 0.08*  0.11* 0.09*   Thyroid Function Tests  Recent Labs  03/15/16 1630  TSH 2.055    Telemetry    Probable atrial fibrillation with intermittent pacing - Personally Reviewed    Radiology    Dg Chest Port 1 View  Result Date: 03/16/2016 CLINICAL DATA:  Shortness of breath. History of CHF, atrial fibrillation, and COPD. EXAM: PORTABLE CHEST 1 VIEW COMPARISON:  PA and lateral chest x-ray of December 03, 2015 FINDINGS: The lungs remain hyperinflated. The interstitial markings are increased diffusely. The pulmonary vascularity is engorged. The cardiac silhouette is enlarged. There small bilateral pleural effusions greatest on the left. The ICD is in stable position. There is calcification in the wall of the aortic arch. The bony structures exhibit no acute abnormalities where visualized. IMPRESSION: CHF with interstitial edema and small bilateral pleural effusions. Underlying COPD. Thoracic aortic atherosclerosis. Electronically Signed   By: David  Martinique M.D.   On: 03/16/2016 07:42     Patient  Profile     82 year old male with past medical history of paroxysmal atrial fibrillation, prior pacemaker, coronary artery disease, ischemic cardiomyopathy, renal insufficiency with acute on chronic systolic congestive heart failure.  Assessment & Plan    1 acute systolic congestive heart failure-patient is improving this morning. I/O-2310. Continue present dose of Lasix. Follow renal function. Await follow-up echocardiogram. Previous ejection fraction 25-30%.  2 Atrial fibrillation-patient with history of paroxysmal atrial fibrillation. Amiodarone was discontinued previously as there was concern it was causing pulmonary toxicity. He appears to be in atrial fibrillation on telemetry. This could be contributing to his CHF symptoms. I will increase carvedilol to 6.25 mg twice a day. We will interrogate his pacemaker. If we can control his CHF symptoms I would favor rate control and anticoagulation. Continue apixaban CHADSvasc 5.   3 chronic stage IV kidney disease-follow renal function closely with diuresis.  4 ischemic cardiomyopathy-continue carvedilol. We will consider hydralazine/nitrates later if blood pressure allows. No ACE inhibitor given renal insufficiency.   5 prior pacemaker-we will have device interrogated.  6 COPD    Signed, Kirk Ruths, MD  03/16/2016, 10:21 AM

## 2016-03-16 NOTE — Care Management Note (Signed)
Case Management Note  Patient Details  Name: JIAYI KOBERSTEIN MRN: [id number] Date of Birth: Jun 30, 1917  Subjective/Objective: 82 y.o. M admitted with Heart Failure. Lives with 80 y.o. Partially blind Wife in private residence. [facility] Oxygen provided by [facility]. Usually for prn and HS only. Aware he will need to bring tank to hospital at discharge if he needs oxygen. Has portable tank and small concentrator. Willing to have [facility] services if needed and would like [facility] to provide.   Son lives close and stops in twice daily. Daughter, at bedside lives OOT but is here to assist her brother.                  Action/Plan: CM will continue to follow for discharge needs. None anticipated at present   Expected Discharge Date:                  Expected Discharge Plan:  Home/Self Care  In-House Referral:  NA  Discharge planning Services  CM Consult  Post Acute Care Choice:    Choice offered to:  Patient, Adult Children  DME Arranged:  Oxygen DME Agency:  [place]:    [place] Agency:     Status of Service:  In process, will continue to follow  If discussed at Long Length of Stay Meetings, dates discussed:    Additional Comments:  Delrae Sawyers, RN 03/16/2016, 10:27 AM

## 2016-03-17 ENCOUNTER — Inpatient Hospital Stay (HOSPITAL_COMMUNITY): Payer: Medicare Other

## 2016-03-17 ENCOUNTER — Other Ambulatory Visit: Payer: Self-pay

## 2016-03-17 DIAGNOSIS — R0600 Dyspnea, unspecified: Secondary | ICD-10-CM

## 2016-03-17 LAB — BASIC METABOLIC PANEL
ANION GAP: 10 (ref 5–15)
BUN: 29 mg/dL — ABNORMAL HIGH (ref 6–20)
CALCIUM: 8.3 mg/dL — AB (ref 8.9–10.3)
CO2: 25 mmol/L (ref 22–32)
CREATININE: 1.76 mg/dL — AB (ref 0.61–1.24)
Chloride: 107 mmol/L (ref 101–111)
GFR, EST AFRICAN AMERICAN: 36 mL/min — AB (ref >60)
GFR, EST NON AFRICAN AMERICAN: 31 mL/min — AB (ref >60)
Glucose, Bld: 113 mg/dL — ABNORMAL HIGH (ref 65–99)
Potassium: 3.5 mmol/L (ref 3.5–5.1)
SODIUM: 142 mmol/L (ref 135–145)

## 2016-03-17 LAB — ECHOCARDIOGRAM COMPLETE
HEIGHTINCHES: 70 in
Weight: 2849.6 oz

## 2016-03-17 MED ORDER — PERFLUTREN LIPID MICROSPHERE
1.0000 mL | INTRAVENOUS | Status: AC | PRN
  Administered 2016-03-17: 2 mL via INTRAVENOUS
  Filled 2016-03-17: qty 10

## 2016-03-17 MED ORDER — FENOFIBRATE 48 MG PO TABS: 24.0000 mg | ORAL_TABLET | Freq: Every day | ORAL | 1 refills | Status: DC

## 2016-03-17 MED ORDER — MAGNESIUM HYDROXIDE 400 MG/5ML PO SUSP
30.0000 mL | Freq: Every day | ORAL | Status: DC | PRN
  Administered 2016-03-17: 30 mL via ORAL
  Filled 2016-03-17: qty 30

## 2016-03-17 NOTE — Consult Note (Signed)
   [facility] CM Inpatient Consult   03/17/2016  CLEVELAND ADELSON [birth year] [id number]  Patient evaluated for community based chronic disease management services with [facility] as a benefit of patient's Medicare Insurance. Spoke with patient and daughter at bedside to explain [facility] services. Daughter states, "He has a hard time hearing over the phone and would have problems with automated phone calls.  Consent form signed for post hospital monitoring and follow up.   Patient will receive post hospital discharge call and will be evaluated for monthly home visits for assessments and disease process education.  Left contact information and [facility] literature at bedside.  Of note, [facility] Care Management services does not replace or interfere with any services that are arranged by inpatient case management or social work.  For additional questions or referrals please contact:   Natividad Brood, RN BSN [facility] Liaison  [phone] business mobile phone Toll free office [phone]

## 2016-03-17 NOTE — Progress Notes (Signed)
Patient Name: Levi Lewis Date of Encounter: 03/17/2016  Primary Cardiologist: Dr [facility] Problem List     Principal Problem:   Acute on chronic systolic CHF (congestive heart failure) (HCC) Active Problems:   CAD (coronary artery disease)   Dyslipidemia   COPD (chronic obstructive pulmonary disease) (HCC)   Atrial fibrillation (HCC)   Alternating (bilateral) bundle branch block   CKD (chronic kidney disease) stage 4, GFR 15-29 ml/min (HCC)   Cardiomyopathy, EF by echo 25-30%   Pacemaker   Chronic systolic CHF (congestive heart failure) (HCC)   HX: anticoagulation   Acute on chronic systolic HF (heart failure) (HCC)     Subjective   No chest pain; dyspnea improving  Inpatient Medications    Scheduled Meds: . apixaban  2.5 mg Oral BID  . carvedilol  6.25 mg Oral QHS  . colchicine  0.3 mg Oral Daily  . fenofibrate  54 mg Oral Daily  . furosemide  80 mg Intravenous Q12H  . gabapentin  300 mg Oral BID  . levothyroxine  75 mcg Oral QAC breakfast  . pantoprazole  40 mg Oral Daily  . predniSONE  5 mg Oral Daily  . sodium chloride flush  3 mL Intravenous Q12H  . tamsulosin  0.4 mg Oral BID   Continuous Infusions:  PRN Meds: sodium chloride, acetaminophen, guaiFENesin, ondansetron (ZOFRAN) IV, sodium chloride flush, triazolam   Vital Signs    Vitals:   03/16/16 0613 03/16/16 1200 03/16/16 2115 03/17/16 0449  BP: 117/80 102/64 108/61 (!) 103/55  Pulse: 74 76 76 70  Resp: 18 20 18 18   Temp: 97.7 F (36.5 C) 97.6 F (36.4 C) 97.5 F (36.4 C) 97.6 F (36.4 C)  TempSrc: Oral Oral Oral Oral  SpO2: 93% 92% 95% 93%  Weight: 182 lb 11.2 oz (82.9 kg)   178 lb 1.6 oz (80.8 kg)  Height:        Intake/Output Summary (Last 24 hours) at 03/17/16 1018 Last data filed at 03/17/16 0944  Gross per 24 hour  Intake              920 ml  Output             3900 ml  Net            -2980 ml   Filed Weights   03/15/16 1617 03/16/16 0613 03/17/16 0449  Weight:  186 lb 12.8 oz (84.7 kg) 182 lb 11.2 oz (82.9 kg) 178 lb 1.6 oz (80.8 kg)    Physical Exam   GEN: Well nourished, well developed, in no acute distress.  HEENT: Grossly normal.  Neck: Supple Cardiac: irregular; no peripheral edema Respiratory: Diminished BS bases GI: Soft, nontender, nondistended MS: no deformity or atrophy. Skin: warm and dry, no rash. Neuro:  Strength and sensation are intact. Psych: AAOx3.  Normal affect.  Labs    CBC  Recent Labs  03/15/16 1630  WBC 9.8  NEUTROABS 7.4  HGB 11.7*  HCT 35.1*  MCV 93.4  PLT [id number]   Basic Metabolic Panel  Recent Labs  03/15/16 1630 03/16/16 0511 03/17/16 0432  NA 137 142 142  K 4.1 3.8 3.5  CL 106 109 107  CO2 21* 24 25  GLUCOSE 112* 112* 113*  BUN 32* 29* 29*  CREATININE 1.87* 1.79* 1.76*  CALCIUM 8.7* 8.4* 8.3*  MG 2.3  --   --    Liver Function Tests  Recent Labs  03/15/16 1630  AST 19  ALT 19  ALKPHOS 57  BILITOT 0.8  PROT 6.1*  ALBUMIN 3.1*   Cardiac Enzymes  Recent Labs  03/15/16 1630 03/15/16 2147 03/16/16 0511  TROPONINI 0.08* 0.11* 0.09*   Thyroid Function Tests  Recent Labs  03/15/16 1630  TSH 2.055    Telemetry    Atrial fibrillation with intermittent pacing - Personally Reviewed    Radiology    Dg Chest Port 1 View  Result Date: 03/16/2016 CLINICAL DATA:  Shortness of breath. History of CHF, atrial fibrillation, and COPD. EXAM: PORTABLE CHEST 1 VIEW COMPARISON:  PA and lateral chest x-ray of December 03, 2015 FINDINGS: The lungs remain hyperinflated. The interstitial markings are increased diffusely. The pulmonary vascularity is engorged. The cardiac silhouette is enlarged. There small bilateral pleural effusions greatest on the left. The ICD is in stable position. There is calcification in the wall of the aortic arch. The bony structures exhibit no acute abnormalities where visualized. IMPRESSION: CHF with interstitial edema and small bilateral pleural effusions.  Underlying COPD. Thoracic aortic atherosclerosis. Electronically Signed   By: David  Martinique M.D.   On: 03/16/2016 07:42     Patient Profile     82 year old male with past medical history of paroxysmal atrial fibrillation, prior pacemaker, coronary artery disease, ischemic cardiomyopathy, renal insufficiency with acute on chronic systolic congestive heart failure and recurrent atrial fibrillation.  Assessment & Plan    1 acute systolic congestive heart failure-patient is improving this morning. I/O-1300. Continue present dose of Lasix. Follow renal function. Await follow-up echocardiogram. Previous ejection fraction 25-30%.  2 Atrial fibrillation-patient with history of paroxysmal atrial fibrillation. Amiodarone was discontinued previously as there was concern it was causing pulmonary toxicity. He is in recurrent atrial fibrillation. This is likely contributing to his CHF symptoms. Continue carvedilol 6.25 mg twice a day. Options for antiarrhythmics limited by EF, CAD and renal function. If we can control his CHF symptoms I would favor rate control and anticoagulation. Continue apixaban CHADSvasc 5.   3 chronic stage IV kidney disease-follow renal function closely with diuresis.  4 ischemic cardiomyopathy-continue carvedilol. We will consider hydralazine/nitrates later if blood pressure allows. No ACE inhibitor given renal insufficiency.   5 prior pacemaker-we will have device interrogated.  6 COPD    Signed, Kirk Ruths, MD  03/17/2016, 10:18 AM

## 2016-03-17 NOTE — Progress Notes (Signed)
  Echocardiogram 2D Echocardiogram with Definity has been performed.  Diamond Nickel 03/17/2016, 12:34 PM

## 2016-03-17 NOTE — Evaluation (Signed)
Physical Therapy Evaluation Patient Details Name: Levi Lewis MRN: [id number] DOB: 02-06-1918 Today's Date: 03/17/2016   History of Present Illness  Pt adm with acute on chronic CHF. PMH - CAD, pacer, HTN, gout, copd  Clinical Impression  Pt admitted with above diagnosis and presents to PT with functional limitations due to deficits listed below (See PT problem list). Pt needs skilled PT to maximize independence and safety to allow discharge to home with family. Pt doing well and close to baseline. Will follow acutely but doubt will need follow up PT after DC.     Follow Up Recommendations No PT follow up    Equipment Recommendations  None recommended by PT    Recommendations for Other Services       Precautions / Restrictions Precautions Precautions: None Restrictions Weight Bearing Restrictions: No      Mobility  Bed Mobility Overal bed mobility: Modified Independent             General bed mobility comments: Incr time  Transfers Overall transfer level: Needs assistance Equipment used: Rolling walker (2 wheeled) Transfers: Sit to/from Stand Sit to Stand: Supervision         General transfer comment: assist for safety  Ambulation/Gait Ambulation/Gait assistance: Supervision Ambulation Distance (Feet): 225 Feet Assistive device: Rolling walker (2 wheeled) Gait Pattern/deviations: Step-through pattern;Decreased stride length;Trunk flexed   Gait velocity interpretation: at or above normal speed for age/gender General Gait Details: Pt amb on 3L of O2 with dyspnea 2/4 and SpO2 96%.  Stairs            Wheelchair Mobility    Modified Rankin (Stroke Patients Only)       Balance Overall balance assessment: Needs assistance Sitting-balance support: No upper extremity supported;Feet supported Sitting balance-Leahy Scale: Good     Standing balance support: No upper extremity supported Standing balance-Leahy Scale: Fair                                Pertinent Vitals/Pain Pain Assessment: No/denies pain    Home Living Family/patient expects to be discharged to:: Private residence Living Arrangements: Spouse/significant other Available Help at Discharge: Family;Available PRN/intermittently (wife legally blind) Type of Home: House Home Access: Stairs to enter Entrance Stairs-Rails: Can reach both;Right;Left Entrance Stairs-Number of Steps: 3 Home Layout: One level Home Equipment: Walker - 2 wheels;Cane - single point;Shower seat Additional Comments: Usually gets down in bathtub. If not feeling well has a shower seat.    Prior Function Level of Independence: Independent with assistive device(s)         Comments: Uses cane or walker PRN     Hand Dominance   Dominant Hand: Right    Extremity/Trunk Assessment   Upper Extremity Assessment: Overall WFL for tasks assessed           Lower Extremity Assessment: Generalized weakness         Communication   Communication: HOH  Cognition Arousal/Alertness: Awake/alert Behavior During Therapy: WFL for tasks assessed/performed Overall Cognitive Status: Within Functional Limits for tasks assessed                      General Comments      Exercises     Assessment/Plan    PT Assessment Patient needs continued PT services  PT Problem List Decreased strength;Decreased balance;Decreased mobility          PT Treatment Interventions DME instruction;Gait training;Functional mobility training;Therapeutic  activities;Therapeutic exercise;Balance training;Patient/family education    PT Goals (Current goals can be found in the Care Plan section)  Acute Rehab PT Goals Patient Stated Goal: return home PT Goal Formulation: With patient Time For Goal Achievement: 03/24/16 Potential to Achieve Goals: Good    Frequency Min 3X/week   Barriers to discharge        Co-evaluation               End of Session Equipment Utilized During  Treatment: Gait belt Activity Tolerance: Patient tolerated treatment well Patient left: in chair;with call bell/phone within reach;with family/visitor present Nurse Communication: Mobility status         Time: 1351-1411 PT Time Calculation (min) (ACUTE ONLY): 20 min   Charges:   PT Evaluation $PT Eval Moderate Complexity: 1 Procedure     PT G CodesShary Decamp Maycok 2016-04-13, 3:00 PM [employer] PT [phone]

## 2016-03-18 LAB — BASIC METABOLIC PANEL
Anion gap: 13 (ref 5–15)
BUN: 35 mg/dL — ABNORMAL HIGH (ref 6–20)
CO2: 25 mmol/L (ref 22–32)
Calcium: 8.5 mg/dL — ABNORMAL LOW (ref 8.9–10.3)
Chloride: 104 mmol/L (ref 101–111)
Creatinine, Ser: 1.96 mg/dL — ABNORMAL HIGH (ref 0.61–1.24)
GFR calc Af Amer: 31 mL/min — ABNORMAL LOW (ref >60)
GFR calc non Af Amer: 27 mL/min — ABNORMAL LOW (ref >60)
Glucose, Bld: 118 mg/dL — ABNORMAL HIGH (ref 65–99)
Potassium: 3.6 mmol/L (ref 3.5–5.1)
Sodium: 142 mmol/L (ref 135–145)

## 2016-03-18 NOTE — Progress Notes (Signed)
Physical Therapy Treatment Patient Details Name: Levi Lewis MRN: [id number] DOB: 05-Jun-1917 Today's Date: 03/18/2016    History of Present Illness Pt adm with acute on chronic CHF. PMH - CAD, pacer, HTN, gout, copd    PT Comments    Pt presented supine in bed with HOB elevated, awake and willing to participate in therapy session. Pt making good progress with mobility. Pt would continue to benefit from skilled physical therapy services at this time while admitted to address his limitations in order to improve his overall safety and independence with functional mobility.    Follow Up Recommendations  No PT follow up     Equipment Recommendations  None recommended by PT    Recommendations for Other Services       Precautions / Restrictions Precautions Precautions: None Restrictions Weight Bearing Restrictions: No    Mobility  Bed Mobility Overal bed mobility: Modified Independent             General bed mobility comments: increased time  Transfers Overall transfer level: Needs assistance Equipment used: Rolling walker (2 wheeled) Transfers: Sit to/from Stand Sit to Stand: Min guard         General transfer comment: despite VC'ing for hand placement, pt used hands to pull up from RW; min guard for safety  Ambulation/Gait Ambulation/Gait assistance: Supervision Ambulation Distance (Feet): 200 Feet Assistive device: Rolling walker (2 wheeled) Gait Pattern/deviations: Step-through pattern;Decreased stride length;Trunk flexed Gait velocity: decreased   General Gait Details: Pt ambulated on 3L of O2 via [place] with SPO2 maintaining >93% throughout   Stairs            Wheelchair Mobility    Modified Rankin (Stroke Patients Only)       Balance Overall balance assessment: Needs assistance Sitting-balance support: Feet supported;No upper extremity supported Sitting balance-Leahy Scale: Good     Standing balance support: During functional activity;No  upper extremity supported Standing balance-Leahy Scale: Fair                      Cognition Arousal/Alertness: Awake/alert Behavior During Therapy: WFL for tasks assessed/performed Overall Cognitive Status: Within Functional Limits for tasks assessed                      Exercises      General Comments        Pertinent Vitals/Pain Pain Assessment: No/denies pain    Home Living                      Prior Function            PT Goals (current goals can now be found in the care plan section) Acute Rehab PT Goals Patient Stated Goal: return home PT Goal Formulation: With patient Time For Goal Achievement: 03/24/16 Potential to Achieve Goals: Good Progress towards PT goals: Progressing toward goals    Frequency    Min 3X/week      PT Plan Current plan remains appropriate    Co-evaluation             End of Session Equipment Utilized During Treatment: Gait belt;Oxygen (3L of O2) Activity Tolerance: Patient tolerated treatment well Patient left: with family/visitor present;Other (comment) (pt in bathroom and planning to call NT when finished)     Time: [id number] PT Time Calculation (min) (ACUTE ONLY): 15 min  Charges:  $Gait Training: 8-22 mins  G CodesClearnce Sorrel Dasha Kawabata 03/21/16, 11:12 AM Sherie Don, PT, DPT [phone]

## 2016-03-18 NOTE — Progress Notes (Signed)
Patient Name: Levi Lewis Date of Encounter: 03/18/2016  Primary Cardiologist: Dr [facility] Problem List     Principal Problem:   Acute on chronic systolic CHF (congestive heart failure) (HCC) Active Problems:   CAD (coronary artery disease)   Dyslipidemia   COPD (chronic obstructive pulmonary disease) (HCC)   Atrial fibrillation (HCC)   Alternating (bilateral) bundle branch block   CKD (chronic kidney disease) stage 4, GFR 15-29 ml/min (HCC)   Cardiomyopathy, EF by echo 25-30%   Pacemaker   Chronic systolic CHF (congestive heart failure) (HCC)   HX: anticoagulation   Acute on chronic systolic HF (heart failure) (HCC)     Subjective   No chest pain or dyspnea  Inpatient Medications    Scheduled Meds: . apixaban  2.5 mg Oral BID  . carvedilol  6.25 mg Oral QHS  . colchicine  0.3 mg Oral Daily  . fenofibrate  54 mg Oral Daily  . furosemide  80 mg Intravenous Q12H  . gabapentin  300 mg Oral BID  . levothyroxine  75 mcg Oral QAC breakfast  . pantoprazole  40 mg Oral Daily  . predniSONE  5 mg Oral Daily  . sodium chloride flush  3 mL Intravenous Q12H  . tamsulosin  0.4 mg Oral BID   Continuous Infusions:  PRN Meds: sodium chloride, acetaminophen, guaiFENesin, magnesium hydroxide, ondansetron (ZOFRAN) IV, sodium chloride flush, triazolam   Vital Signs    Vitals:   03/17/16 0449 03/17/16 1116 03/17/16 2208 03/18/16 0600  BP: (!) 103/55 (!) 106/56 (!) 95/58 (!) 98/59  Pulse: 70 71 77 70  Resp: 18 18 16 18   Temp: 97.6 F (36.4 C) 97.5 F (36.4 C) 98 F (36.7 C) 97.3 F (36.3 C)  TempSrc: Oral Oral Oral Oral  SpO2: 93% 94% 94% 94%  Weight: 178 lb 1.6 oz (80.8 kg)   175 lb 12.8 oz (79.7 kg)  Height:        Intake/Output Summary (Last 24 hours) at 03/18/16 1159 Last data filed at 03/18/16 1059  Gross per 24 hour  Intake             1200 ml  Output             2580 ml  Net            -1380 ml   Filed Weights   03/16/16 0613 03/17/16 0449  03/18/16 0600  Weight: 182 lb 11.2 oz (82.9 kg) 178 lb 1.6 oz (80.8 kg) 175 lb 12.8 oz (79.7 kg)    Physical Exam   GEN: Well nourished, well developed, in no acute distress.  HEENT: Grossly normal.  Neck: Supple Cardiac: irregular; no peripheral edema Respiratory: CTA GI: Soft, nontender, nondistended MS: no deformity or atrophy. Skin: warm and dry, no rash. Neuro:  Strength and sensation are intact. Psych: AAOx3.  Normal affect.  Labs    CBC  Recent Labs  03/15/16 1630  WBC 9.8  NEUTROABS 7.4  HGB 11.7*  HCT 35.1*  MCV 93.4  PLT [id number]   Basic Metabolic Panel  Recent Labs  03/15/16 1630  03/17/16 0432 03/18/16 0325  NA 137  < > 142 142  K 4.1  < > 3.5 3.6  CL 106  < > 107 104  CO2 21*  < > 25 25  GLUCOSE 112*  < > 113* 118*  BUN 32*  < > 29* 35*  CREATININE 1.87*  < > 1.76* 1.96*  CALCIUM 8.7*  < >  8.3* 8.5*  MG 2.3  --   --   --   < > = values in this interval not displayed. Liver Function Tests  Recent Labs  03/15/16 1630  AST 19  ALT 19  ALKPHOS 57  BILITOT 0.8  PROT 6.1*  ALBUMIN 3.1*   Cardiac Enzymes  Recent Labs  03/15/16 1630 03/15/16 2147 03/16/16 0511  TROPONINI 0.08* 0.11* 0.09*   Thyroid Function Tests  Recent Labs  03/15/16 1630  TSH 2.055    Telemetry    Atrial fibrillation with intermittent pacing - Personally Reviewed     Patient Profile     82 year old male with past medical history of paroxysmal atrial fibrillation, prior pacemaker, coronary artery disease, ischemic cardiomyopathy, renal insufficiency with acute on chronic systolic congestive heart failure and recurrent atrial fibrillation.  Assessment & Plan    1 acute systolic congestive heart failure-patient continues to improve. I/O-1900. Continue present dose of Lasix. Follow renal function. EF 20-25 on echo.  2 Atrial fibrillation-patient with history of paroxysmal atrial fibrillation. Amiodarone was discontinued previously as there was concern it was  causing pulmonary toxicity. He is in recurrent atrial fibrillation. This is likely contributing to his CHF symptoms. Continue carvedilol 6.25 mg twice a day. Options for antiarrhythmics limited by EF, CAD and renal function. If we can control his CHF symptoms I would favor rate control and anticoagulation. Continue apixaban CHADSvasc 5.   3 chronic stage IV kidney disease-follow renal function closely with diuresis.  4 ischemic cardiomyopathy-continue carvedilol. We will consider hydralazine/nitrates later if blood pressure allows. No ACE inhibitor given renal insufficiency.   5 prior pacemaker-we will have device interrogated.  6 COPD  Ambulate today; if pt feels well in AM could be DCed and fu with Dr Marlou Porch.  Signed, Kirk Ruths, MD  03/18/2016, 11:59 AM

## 2016-03-18 NOTE — Care Management Important Message (Signed)
Important Message  Patient Details  Name: Levi Lewis MRN: [id number] Date of Birth: [birth year]   Medicare Important Message Given:  Yes    Nathen May 03/18/2016, 12:26 PM

## 2016-03-18 NOTE — Progress Notes (Signed)
Pharmacist Heart Failure Core Measure Documentation  Assessment: Levi Lewis has an EF documented as 20-25% on 03/17/16  by ECHO.  Rationale: Heart failure patients with left ventricular systolic dysfunction (LVSD) and an EF < 40% should be prescribed an angiotensin converting enzyme inhibitor (ACEI) or angiotensin receptor blocker (ARB) at discharge unless a contraindication is documented in the medical record.  This patient is not currently on an ACEI or ARB for HF.  This note is being placed in the record in order to provide documentation that a contraindication to the use of these agents is present for this encounter.  ACE Inhibitor or Angiotensin Receptor Blocker is contraindicated (specify all that apply)  []   ACEI allergy AND ARB allergy []   Angioedema []   Moderate or severe aortic stenosis []   Hyperkalemia [x]   Hypotension []   Renal artery stenosis [x]   Worsening renal function, preexisting renal disease or dysfunction   Deboraha Sprang 03/18/2016 11:19 AM

## 2016-03-19 DIAGNOSIS — I480 Paroxysmal atrial fibrillation: Secondary | ICD-10-CM

## 2016-03-19 DIAGNOSIS — I255 Ischemic cardiomyopathy: Secondary | ICD-10-CM

## 2016-03-19 DIAGNOSIS — Z950 Presence of cardiac pacemaker: Secondary | ICD-10-CM

## 2016-03-19 DIAGNOSIS — I5022 Chronic systolic (congestive) heart failure: Secondary | ICD-10-CM

## 2016-03-19 LAB — CBC
HCT: 36.6 % — ABNORMAL LOW (ref 39.0–52.0)
Hemoglobin: 11.8 g/dL — ABNORMAL LOW (ref 13.0–17.0)
MCH: 30.6 pg (ref 26.0–34.0)
MCHC: 32.2 g/dL (ref 30.0–36.0)
MCV: 95.1 fL (ref 78.0–100.0)
PLATELETS: 350 10*3/uL (ref 150–400)
RBC: 3.85 MIL/uL — ABNORMAL LOW (ref 4.22–5.81)
RDW: 14.6 % (ref 11.5–15.5)
WBC: 8.9 10*3/uL (ref 4.0–10.5)

## 2016-03-19 LAB — BASIC METABOLIC PANEL
Anion gap: 10 (ref 5–15)
BUN: 37 mg/dL — AB (ref 6–20)
CALCIUM: 8.8 mg/dL — AB (ref 8.9–10.3)
CHLORIDE: 104 mmol/L (ref 101–111)
CO2: 27 mmol/L (ref 22–32)
CREATININE: 2.03 mg/dL — AB (ref 0.61–1.24)
GFR calc Af Amer: 30 mL/min — ABNORMAL LOW (ref >60)
GFR calc non Af Amer: 26 mL/min — ABNORMAL LOW (ref >60)
Glucose, Bld: 111 mg/dL — ABNORMAL HIGH (ref 65–99)
Potassium: 4 mmol/L (ref 3.5–5.1)
SODIUM: 141 mmol/L (ref 135–145)

## 2016-03-19 MED ORDER — FUROSEMIDE 80 MG PO TABS
80.0000 mg | ORAL_TABLET | Freq: Two times a day (BID) | ORAL | Status: DC
  Administered 2016-03-20: 80 mg via ORAL
  Filled 2016-03-19: qty 1

## 2016-03-19 NOTE — Progress Notes (Signed)
Patient Name: Levi Lewis Date of Encounter: 03/19/2016  Primary Cardiologist: Dr [facility] Problem List     Principal Problem:   Acute on chronic systolic CHF (congestive heart failure) (HCC) Active Problems:   CAD (coronary artery disease)   Dyslipidemia   COPD (chronic obstructive pulmonary disease) (HCC)   Atrial fibrillation (HCC)   Alternating (bilateral) bundle branch block   CKD (chronic kidney disease) stage 4, GFR 15-29 ml/min (HCC)   Cardiomyopathy, EF by echo 25-30%   Pacemaker   Chronic systolic CHF (congestive heart failure) (HCC)   HX: anticoagulation   Acute on chronic systolic HF (heart failure) (HCC)     Subjective   Breathing better overnight. Now 7.7L negative (another 2L negative overnight). Creatinine is trending upward.  Inpatient Medications    Scheduled Meds: . apixaban  2.5 mg Oral BID  . carvedilol  6.25 mg Oral QHS  . colchicine  0.3 mg Oral Daily  . fenofibrate  54 mg Oral Daily  . furosemide  80 mg Intravenous Q12H  . gabapentin  300 mg Oral BID  . levothyroxine  75 mcg Oral QAC breakfast  . pantoprazole  40 mg Oral Daily  . predniSONE  5 mg Oral Daily  . sodium chloride flush  3 mL Intravenous Q12H  . tamsulosin  0.4 mg Oral BID   Continuous Infusions:  PRN Meds: sodium chloride, acetaminophen, guaiFENesin, magnesium hydroxide, ondansetron (ZOFRAN) IV, sodium chloride flush, triazolam   Vital Signs    Vitals:   03/18/16 1212 03/18/16 1951 03/18/16 1954 03/19/16 0454  BP: 102/60 120/70 120/70 99/61  Pulse: 70 75 75 73  Resp: 18  18 18   Temp: 97.6 F (36.4 C)  97.4 F (36.3 C) 97 F (36.1 C)  TempSrc: Oral  Oral Oral  SpO2: 97%  98% 94%  Weight:    172 lb 3.2 oz (78.1 kg)  Height:        Intake/Output Summary (Last 24 hours) at 03/19/16 1113 Last data filed at 03/19/16 1019  Gross per 24 hour  Intake             1223 ml  Output             2425 ml  Net            -1202 ml   Filed Weights   03/17/16 0449  03/18/16 0600 03/19/16 0454  Weight: 178 lb 1.6 oz (80.8 kg) 175 lb 12.8 oz (79.7 kg) 172 lb 3.2 oz (78.1 kg)    Physical Exam   GEN: Well nourished, well developed, in no acute distress.  HEENT: Grossly normal.  Neck: Supple Cardiac: irregular; no peripheral edema Respiratory: CTA GI: Soft, nontender, nondistended MS: no deformity or atrophy. Skin: warm and dry, no rash. Neuro:  Strength and sensation are intact. Psych: AAOx3.  Normal affect.  Labs    CBC  Recent Labs  03/19/16 0335  WBC 8.9  HGB 11.8*  HCT 36.6*  MCV 95.1  PLT [id number]   Basic Metabolic Panel  Recent Labs  03/18/16 0325 03/19/16 0335  NA 142 141  K 3.6 4.0  CL 104 104  CO2 25 27  GLUCOSE 118* 111*  BUN 35* 37*  CREATININE 1.96* 2.03*  CALCIUM 8.5* 8.8*   Liver Function Tests No results for input(s): AST, ALT, ALKPHOS, BILITOT, PROT, ALBUMIN in the last 72 hours. Cardiac Enzymes No results for input(s): CKTOTAL, CKMB, CKMBINDEX, TROPONINI in the last 72 hours. Thyroid Function Tests  No results for input(s): TSH, T4TOTAL, T3FREE, THYROIDAB in the last 72 hours.  Invalid input(s): FREET3  Telemetry    Atrial fibrillation with intermittent pacing - Personally Reviewed     Patient Profile     82 year old male with past medical history of paroxysmal atrial fibrillation, prior pacemaker, coronary artery disease, ischemic cardiomyopathy, renal insufficiency with acute on chronic systolic congestive heart failure and recurrent atrial fibrillation.  Assessment & Plan    1 acute systolic congestive heart failure-patient continues to improve. Significant urine output with rising creatinine - hold additional diuresis today and switch to po lasix tomorrow. Follow renal function. EF 20-25 on echo.  2 Atrial fibrillation-patient with history of paroxysmal atrial fibrillation. Amiodarone was discontinued previously as there was concern it was causing pulmonary toxicity. He is in recurrent atrial  fibrillation. This is likely contributing to his CHF symptoms. Continue carvedilol 6.25 mg twice a day. Options for antiarrhythmics limited by EF, CAD and renal function. If we can control his CHF symptoms I would favor rate control and anticoagulation. Continue apixaban CHADSvasc 5.   3 chronic stage IV kidney disease-follow renal function closely with diuresis. Creatinine is trending upward - hold additional IV lasix today. Switch to po lasix tomorrow.  4 ischemic cardiomyopathy-continue carvedilol. We will consider hydralazine/nitrates later if blood pressure allows. No ACE inhibitor given renal insufficiency.   5 prior pacemaker-we will have device interrogated.  6 COPD  Possible d/c in the am tomorrow if creatinine is improved.  Pixie Casino, MD, [facility] Attending Cardiologist [place], MD  03/19/2016, 11:13 AM

## 2016-03-20 ENCOUNTER — Encounter: Payer: Self-pay | Admitting: Cardiology

## 2016-03-20 ENCOUNTER — Other Ambulatory Visit: Payer: Self-pay | Admitting: Cardiovascular Disease

## 2016-03-20 DIAGNOSIS — J438 Other emphysema: Secondary | ICD-10-CM

## 2016-03-20 LAB — BASIC METABOLIC PANEL
Anion gap: 9 (ref 5–15)
BUN: 37 mg/dL — AB (ref 6–20)
CALCIUM: 8.6 mg/dL — AB (ref 8.9–10.3)
CO2: 28 mmol/L (ref 22–32)
CREATININE: 1.9 mg/dL — AB (ref 0.61–1.24)
Chloride: 104 mmol/L (ref 101–111)
GFR calc Af Amer: 33 mL/min — ABNORMAL LOW (ref >60)
GFR, EST NON AFRICAN AMERICAN: 28 mL/min — AB (ref >60)
GLUCOSE: 115 mg/dL — AB (ref 65–99)
Potassium: 3.9 mmol/L (ref 3.5–5.1)
Sodium: 141 mmol/L (ref 135–145)

## 2016-03-20 MED ORDER — CARVEDILOL 6.25 MG PO TABS: 6.2500 mg | ORAL_TABLET | Freq: Two times a day (BID) | ORAL | 5 refills | Status: DC

## 2016-03-20 MED ORDER — FUROSEMIDE 80 MG PO TABS: 80.0000 mg | ORAL_TABLET | Freq: Two times a day (BID) | ORAL | 0 refills | Status: DC

## 2016-03-20 NOTE — Discharge Summary (Signed)
Discharge Summary    Patient ID: Levi Lewis,  MRN: [id number], DOB/AGE: 05/28/1917 82 y.o.  Admit date: 03/15/2016 Discharge date: 03/20/2016  Primary Care Provider: Criselda Peaches Primary Cardiologist: Dr. Marlou Porch   Discharge Diagnoses    Principal Problem:   Acute on chronic systolic CHF (congestive heart failure) ([place]) Active Problems:   CAD (coronary artery disease)   Dyslipidemia   COPD (chronic obstructive pulmonary disease) (HCC)   Atrial fibrillation (HCC)   Alternating (bilateral) bundle branch block   CKD (chronic kidney disease) stage 4, GFR 15-29 ml/min (HCC)   Cardiomyopathy, EF by echo 25-30%   Pacemaker   Chronic systolic CHF (congestive heart failure) (HCC)   HX: anticoagulation   Acute on chronic systolic HF (heart failure) ([place])   Allergies No Known Allergies  Diagnostic Studies/Procedures    2D echo 03/17/16 Study Conclusions  - Left ventricle: The cavity size was mildly dilated. Systolic   function was severely reduced. The estimated ejection fraction   was in the range of 20% to 25%. Doppler parameters are consistent   with restrictive physiology, indicative of decreased left   ventricular diastolic compliance and/or increased left atrial   pressure. Doppler parameters are consistent with elevated   ventricular end-diastolic filling pressure. - Ventricular septum: Septal motion showed paradox. - Aortic valve: Trileaflet; mildly thickened, mildly calcified   leaflets. There was mild regurgitation. - Mitral valve: Structurally normal valve. There was moderate   regurgitation. - Left atrium: The atrium was mildly dilated. - Right ventricle: Systolic function was normal. - Right atrium: The atrium was normal in size. - Tricuspid valve: There was moderate regurgitation. - Pulmonic valve: Structurally normal valve. There was no   regurgitation. - Pulmonary arteries: Systolic pressure was mildly increased. PA   peak pressure: 38 mm Hg  (S). - Pericardium, extracardiac: There was no pericardial effusion.  Impressions:  - Severely reduced LVEF with diffuse hypokinesis, paradoxical   septal motion and akinetic apical segments. There is no thrombus.   Restrictive pattern of diastolic dysfunction.   History of Present Illness     82 year old male with past medical history of paroxysmal atrial fibrillation, prior pacemaker, coronary artery disease, ischemic cardiomyopathy, renal insufficiency admitted for acute on chronic systolic congestive heart failure and recurrent atrial fibrillation.  Hospital Course  Pt was admitted for acute on chronic systolic CHF in the setting of recurrent Afib. Management of each hospital problem, is outlined below.  1. Acute systolic congestive heart failure - BNP at time of admit was 1,0097. Admit weight was 186 lb. He was treated with IV lasix and had a good diuretic response. Net I/Os -7L total. He was diuresed down to 172 lb. He was transitioned to PO lasix. He was discharged on 80 mg BID. Renal function improved by discharge at 1.90.  EF remains low but stable at 20-25% on echo.  2 Atrial fibrillation-patient with history of paroxysmal atrial fibrillation. Amiodarone was discontinued previously as there was concern it was causing pulmonary toxicity. It was felt that his recurrent atrial fibrillation was likely contributing to his CHF symptoms. Unfortunately, options for antiarrhythmics limited by EF, CAD and renal function. It was recommended to continue rate control and anticoagulation. He was continued on carvedilol 6.25 mg twice a day. Continue apixaban CHADSvasc 5.   3. chronic stage IV kidney disease- renal function was monitored closely with diuresis. Creatinine remained stable, close to his baseline. Scr day of d/c was 1.90. He was continued po lasix at  diacharge.   4. ischemic cardiomyopathy- continue carvedilol. We will consider hydralazine/nitrates later if blood pressure allows. No  ACE inhibitor given renal insufficiency.   5 pacemaker- follow-up in device clinic.  6 COPD - stable on home meds.  Pt was last seen and examined by Dr. Debara Pickett, who determined he was stable for d/c home. He will f/u with Dr. Marlou Porch.   Consultants: none     Discharge Vitals Blood pressure (!) 104/54, pulse 66, temperature 97.6 F (36.4 C), temperature source Oral, resp. rate 20, height 5\' 10"  (1.778 m), weight 172 lb (78 kg), SpO2 93 %.  Filed Weights   03/18/16 0600 03/19/16 0454 03/20/16 0449  Weight: 175 lb 12.8 oz (79.7 kg) 172 lb 3.2 oz (78.1 kg) 172 lb (78 kg)    Labs & Radiologic Studies    CBC  Recent Labs  03/19/16 0335  WBC 8.9  HGB 11.8*  HCT 36.6*  MCV 95.1  PLT [id number]   Basic Metabolic Panel  Recent Labs  03/19/16 0335 03/20/16 0542  NA 141 141  K 4.0 3.9  CL 104 104  CO2 27 28  GLUCOSE 111* 115*  BUN 37* 37*  CREATININE 2.03* 1.90*  CALCIUM 8.8* 8.6*   Liver Function Tests No results for input(s): AST, ALT, ALKPHOS, BILITOT, PROT, ALBUMIN in the last 72 hours. No results for input(s): LIPASE, AMYLASE in the last 72 hours. Cardiac Enzymes No results for input(s): CKTOTAL, CKMB, CKMBINDEX, TROPONINI in the last 72 hours. BNP Invalid input(s): POCBNP D-Dimer No results for input(s): DDIMER in the last 72 hours. Hemoglobin A1C No results for input(s): HGBA1C in the last 72 hours. Fasting Lipid Panel No results for input(s): CHOL, HDL, LDLCALC, TRIG, CHOLHDL, LDLDIRECT in the last 72 hours. Thyroid Function Tests No results for input(s): TSH, T4TOTAL, T3FREE, THYROIDAB in the last 72 hours.  Invalid input(s): FREET3 _____________  Dg Chest Port 1 View  Result Date: 03/16/2016 CLINICAL DATA:  Shortness of breath. History of CHF, atrial fibrillation, and COPD. EXAM: PORTABLE CHEST 1 VIEW COMPARISON:  PA and lateral chest x-ray of December 03, 2015 FINDINGS: The lungs remain hyperinflated. The interstitial markings are increased diffusely. The  pulmonary vascularity is engorged. The cardiac silhouette is enlarged. There small bilateral pleural effusions greatest on the left. The ICD is in stable position. There is calcification in the wall of the aortic arch. The bony structures exhibit no acute abnormalities where visualized. IMPRESSION: CHF with interstitial edema and small bilateral pleural effusions. Underlying COPD. Thoracic aortic atherosclerosis. Electronically Signed   By: David  Martinique M.D.   On: 03/16/2016 07:42   Disposition   Pt is being discharged home today in good condition.  Follow-up Plans & Appointments    Follow-up Information    Candee Furbish, MD Follow up.   Specialty:  Cardiology Why:  our office will call you with a hospital follow-up visit  Contact information: [address] [place] [address] [phone]          Discharge Instructions    (HEART FAILURE PATIENTS) Call MD:  Anytime you have any of the following symptoms: 1) 3 pound weight gain in 24 hours or 5 pounds in 1 week 2) shortness of breath, with or without a dry hacking cough 3) swelling in the hands, feet or stomach 4) if you have to sleep on extra pillows at night in order to breathe.    Complete by:  As directed    AMB Referral to [facility]  Management    Complete by:  As directed    Reason for consult:  Post hospital TOC   Diagnoses of:   Heart Failure COPD/ Pneumonia     Expected date of contact:  1-3 days (reserved for hospital discharges)   Please assign to community nurse for transition of care calls and assess for home visits. Questions please call:   Natividad Brood, RN BSN [facility] Liaison  [phone] business mobile phone Toll free office [phone]   Diet - low sodium heart healthy    Complete by:  As directed    Increase activity slowly    Complete by:  As directed       Discharge Medications   Current Discharge Medication List    CONTINUE these medications which have  CHANGED   Details  carvedilol (COREG) 6.25 MG tablet Take 1 tablet (6.25 mg total) by mouth 2 (two) times daily with a meal. Qty: 60 tablet, Refills: 5      CONTINUE these medications which have NOT CHANGED   Details  colchicine 0.6 MG tablet Take 1 tablet (0.6 mg total) by mouth 2 (two) times daily. Qty: 10 tablet, Refills: 0    ELIQUIS 2.5 MG TABS tablet TAKE 1 TABLET (2.5 MG TOTAL) BY MOUTH 2 (TWO) TIMES DAILY. Qty: 180 tablet, Refills: 2    fish oil-omega-3 fatty acids 1000 MG capsule Take 2 g by mouth daily.     !! furosemide (LASIX) 80 MG tablet Take 80 mg by mouth 2 (two) times daily.    !! furosemide (LASIX) 80 MG tablet Take 80 mg by mouth 2 (two) times daily.    gabapentin (NEURONTIN) 300 MG capsule Take 300 mg by mouth 2 (two) times daily. Refills: 12    guaiFENesin (MUCINEX) 600 MG 12 hr tablet Take 600 mg by mouth 2 (two) times daily as needed for cough or to loosen phlegm (COLDS).     levothyroxine (SYNTHROID, LEVOTHROID) 75 MCG tablet Take 1 tablet (75 mcg total) by mouth daily. Qty: 30 tablet, Refills: 6    Multiple Vitamins-Minerals (OCUVITE PO) Take 2 tablets by mouth daily.     omeprazole (PRILOSEC) 20 MG capsule Take 20 mg by mouth daily.     predniSONE (DELTASONE) 10 MG tablet Take 5 mg by mouth daily. Refills: 12    tamsulosin (FLOMAX) 0.4 MG CAPS capsule Take 0.4 mg by mouth 2 (two) times daily.     triazolam (HALCION) 0.25 MG tablet Take 0.5 tablets (0.125 mg total) by mouth at bedtime as needed for sleep. Qty: 15 tablet, Refills: 0    fenofibrate (TRICOR) 48 MG tablet Take 0.5 tablets (24 mg total) by mouth daily. Qty: 45 tablet, Refills: 1     !! - Potential duplicate medications found. Please discuss with provider.       Outstanding Labs/Studies     Duration of Discharge Encounter   Greater than 30 minutes including physician time.  Signed, Lyda Jester PA-C 03/20/2016, 12:11 PM

## 2016-03-20 NOTE — Progress Notes (Signed)
Patient Name: Levi Lewis Date of Encounter: 03/20/2016  Primary Cardiologist: Dr [facility] Problem List     Principal Problem:   Acute on chronic systolic CHF (congestive heart failure) (HCC) Active Problems:   CAD (coronary artery disease)   Dyslipidemia   COPD (chronic obstructive pulmonary disease) (HCC)   Atrial fibrillation (HCC)   Alternating (bilateral) bundle branch block   CKD (chronic kidney disease) stage 4, GFR 15-29 ml/min (HCC)   Cardiomyopathy, EF by echo 25-30%   Pacemaker   Chronic systolic CHF (congestive heart failure) (HCC)   HX: anticoagulation   Acute on chronic systolic HF (heart failure) ([place])     Subjective  About even fluid status overnight. Creatinine has improved. Now on po lasix.   Inpatient Medications    Scheduled Meds: . apixaban  2.5 mg Oral BID  . carvedilol  6.25 mg Oral QHS  . colchicine  0.3 mg Oral Daily  . fenofibrate  54 mg Oral Daily  . furosemide  80 mg Oral BID  . gabapentin  300 mg Oral BID  . levothyroxine  75 mcg Oral QAC breakfast  . pantoprazole  40 mg Oral Daily  . predniSONE  5 mg Oral Daily  . sodium chloride flush  3 mL Intravenous Q12H  . tamsulosin  0.4 mg Oral BID   Continuous Infusions:  PRN Meds: sodium chloride, acetaminophen, guaiFENesin, magnesium hydroxide, ondansetron (ZOFRAN) IV, sodium chloride flush, triazolam   Vital Signs    Vitals:   03/19/16 0454 03/19/16 1205 03/19/16 2004 03/20/16 0449  BP: 99/61 (!) 100/54 (!) 109/51 (!) 99/54  Pulse: 73 69 70 (!) 52  Resp: 18 20 20 20   Temp: 97 F (36.1 C) 97.5 F (36.4 C) 97.6 F (36.4 C) 98.5 F (36.9 C)  TempSrc: Oral Oral Oral Oral  SpO2: 94% 96% 96% 96%  Weight: 172 lb 3.2 oz (78.1 kg)   172 lb (78 kg)  Height:        Intake/Output Summary (Last 24 hours) at 03/20/16 1111 Last data filed at 03/20/16 0916  Gross per 24 hour  Intake              923 ml  Output              600 ml  Net              323 ml   Filed Weights   03/18/16 0600 03/19/16 0454 03/20/16 0449  Weight: 175 lb 12.8 oz (79.7 kg) 172 lb 3.2 oz (78.1 kg) 172 lb (78 kg)    Physical Exam   GEN: Well nourished, well developed, in no acute distress.  HEENT: Grossly normal.  Neck: Supple Cardiac: irregular; no peripheral edema Respiratory: CTA GI: Soft, nontender, nondistended MS: no deformity or atrophy. Skin: warm and dry, no rash. Neuro:  Strength and sensation are intact. Psych: AAOx3.  Normal affect.  Labs    CBC  Recent Labs  03/19/16 0335  WBC 8.9  HGB 11.8*  HCT 36.6*  MCV 95.1  PLT [id number]   Basic Metabolic Panel  Recent Labs  03/19/16 0335 03/20/16 0542  NA 141 141  K 4.0 3.9  CL 104 104  CO2 27 28  GLUCOSE 111* 115*  BUN 37* 37*  CREATININE 2.03* 1.90*  CALCIUM 8.8* 8.6*   Liver Function Tests No results for input(s): AST, ALT, ALKPHOS, BILITOT, PROT, ALBUMIN in the last 72 hours. Cardiac Enzymes No results for input(s): CKTOTAL, CKMB, CKMBINDEX,  TROPONINI in the last 72 hours. Thyroid Function Tests No results for input(s): TSH, T4TOTAL, T3FREE, THYROIDAB in the last 72 hours.  Invalid input(s): FREET3  Telemetry    Atrial fibrillation with intermittent pacing - Personally Reviewed     Patient Profile     82 year old male with past medical history of paroxysmal atrial fibrillation, prior pacemaker, coronary artery disease, ischemic cardiomyopathy, renal insufficiency with acute on chronic systolic congestive heart failure and recurrent atrial fibrillation.  Assessment & Plan    1 acute systolic congestive heart failure-patient continues to improve. On po lasix - creatinine has improved overnight. EF 20-25 on echo.  2 Atrial fibrillation-patient with history of paroxysmal atrial fibrillation. Amiodarone was discontinued previously as there was concern it was causing pulmonary toxicity. He is in recurrent atrial fibrillation. This is likely contributing to his CHF symptoms. Continue carvedilol 6.25  mg twice a day. Options for antiarrhythmics limited by EF, CAD and renal function. If we can control his CHF symptoms I would favor rate control and anticoagulation. Continue apixaban CHADSvasc 5.   3 chronic stage IV kidney disease-follow renal function closely with diuresis. Creatinine stable overnight. Continue po lasix.  4 ischemic cardiomyopathy-continue carvedilol. We will consider hydralazine/nitrates later if blood pressure allows. No ACE inhibitor given renal insufficiency.   5 pacemaker- follow-up in device clinic.  6 COPD - stable on home meds.  [place] for d/c home today. Follow-up with Dr. Marlou Porch.  Pixie Casino, MD, [facility] Attending Cardiologist [place], MD  03/20/2016, 11:11 AM

## 2016-03-20 NOTE — Progress Notes (Signed)
Pt discharged home. IV and tele removed. Taken out via wheelchair. Family at the bedside. Questions answered.

## 2016-03-20 NOTE — Progress Notes (Signed)
Late [phone]: Daughter called and requested Refil for Lasix 80mg  Through [facility]. PA made aware to call in prescription.

## 2016-03-21 ENCOUNTER — Encounter: Payer: Self-pay | Admitting: Cardiology

## 2016-03-21 ENCOUNTER — Ambulatory Visit: Payer: Self-pay

## 2016-03-21 ENCOUNTER — Other Ambulatory Visit: Payer: Self-pay

## 2016-03-21 NOTE — Patient Outreach (Signed)
[facility]) Care Management  03/21/2016   MACKAY TEUFEL 05-12-1917 [id number]  Subjective:  Spoke with Son due to patient being extremely hard of hearing. Son stated patient was discharged yesterday   Objective:  Telephonic encounter  Current Medications:  Current Outpatient Prescriptions  Medication Sig Dispense Refill  . carvedilol (COREG) 6.25 MG tablet Take 1 tablet (6.25 mg total) by mouth 2 (two) times daily with a meal. 60 tablet 5  . colchicine 0.6 MG tablet Take 1 tablet (0.6 mg total) by mouth 2 (two) times daily. 10 tablet 0  . ELIQUIS 2.5 MG TABS tablet TAKE 1 TABLET (2.5 MG TOTAL) BY MOUTH 2 (TWO) TIMES DAILY. 180 tablet 2  . fenofibrate (TRICOR) 48 MG tablet Take 0.5 tablets (24 mg total) by mouth daily. 45 tablet 1  . fish oil-omega-3 fatty acids 1000 MG capsule Take 2 g by mouth daily.     . furosemide (LASIX) 80 MG tablet Take 80 mg by mouth 2 (two) times daily.    . furosemide (LASIX) 80 MG tablet Take 1 tablet (80 mg total) by mouth 2 (two) times daily. 60 tablet 0  . gabapentin (NEURONTIN) 300 MG capsule Take 300 mg by mouth 2 (two) times daily.  12  . guaiFENesin (MUCINEX) 600 MG 12 hr tablet Take 600 mg by mouth 2 (two) times daily as needed for cough or to loosen phlegm (COLDS).     Marland Kitchen levothyroxine (SYNTHROID, LEVOTHROID) 75 MCG tablet Take 1 tablet (75 mcg total) by mouth daily. 30 tablet 6  . Multiple Vitamins-Minerals (OCUVITE PO) Take 2 tablets by mouth daily.     Marland Kitchen omeprazole (PRILOSEC) 20 MG capsule Take 20 mg by mouth daily.     . predniSONE (DELTASONE) 10 MG tablet Take 5 mg by mouth daily.  12  . tamsulosin (FLOMAX) 0.4 MG CAPS capsule Take 0.4 mg by mouth 2 (two) times daily.     . triazolam (HALCION) 0.25 MG tablet Take 0.5 tablets (0.125 mg total) by mouth at bedtime as needed for sleep. 15 tablet 0   No current facility-administered medications for this visit.     Functional Status:  In your present state of health, do you  have any difficulty performing the following activities: 03/21/2016 03/15/2016  Hearing? [name]  Vision? Y Y  Difficulty concentrating or making decisions? Y N  Walking or climbing stairs? Y Y  Dressing or bathing? Y Y  Doing errands, shopping? Y Y  Some recent data might be hidden    Fall/Depression Screening: PHQ 2/9 Scores 03/21/2016  PHQ - 2 Score 0   Fall Risk  03/21/2016  Falls in the past year? No  Risk for fall due to : Impaired balance/gait;Impaired mobility;Impaired vision;Medication side effect   Assessment:  Patient is hard of hearing. Patient and family has knowledge deficit related to heart failure and management of heart failure. Patient's son is primary caregiver Patient has history of heart failure.  Plan:   Home visit later this month for further heart failure education, and assessment of community care coordination.

## 2016-03-22 ENCOUNTER — Ambulatory Visit (INDEPENDENT_AMBULATORY_CARE_PROVIDER_SITE_OTHER): Payer: Medicare Other | Admitting: *Deleted

## 2016-03-22 DIAGNOSIS — Z950 Presence of cardiac pacemaker: Secondary | ICD-10-CM

## 2016-03-22 DIAGNOSIS — I495 Sick sinus syndrome: Secondary | ICD-10-CM | POA: Diagnosis not present

## 2016-03-22 DIAGNOSIS — I5022 Chronic systolic (congestive) heart failure: Secondary | ICD-10-CM

## 2016-03-22 NOTE — Progress Notes (Signed)
EPIC Encounter for ICM Monitoring  Patient Name: PRINTES LUTSKY is a 82 y.o. male Date: 03/22/2016 Primary Care Physican: Criselda Peaches, MD Primary Cardiologist: Marlou Porch Electrophysiologist: Faustino Congress Weight: unknown Bi-V Pacing:  75% (was >99% on 02/16/2016)    AT/AF Burden was 1% on 02/16/2016     Attempted ICM call to son, Ronalee Belts and unable to reach. Left message to return call.  Transmission reviewed.   Thoracic impedance normal after being diuresed in hospital and discharged on 03/20/2016.  Recommendations:  Copy of ICM check sent to Dr Marlou Porch and Dr Caryl Comes for review and any recommendations.  Follow-up plan: ICM clinic phone appointment on 03/31/2016.     ICM trend: 03/22/2016       Rosalene Billings, RN 03/22/2016 5:27 PM

## 2016-03-22 NOTE — Progress Notes (Signed)
Remote pacemaker transmission.   

## 2016-03-24 NOTE — Progress Notes (Signed)
Received call back from son, Ronalee Belts.  He stated patient is feeling better since hospital discharge which was for CHF.  He denied he had any shortness of breath, dizziness, or lightheadness.  Advised to call if patient develops any fluid symptoms and reviewed symptoms.  Patient had follow up appointment with PA on 03/31/2016.  Advised would repeat fluid level check on 03/31/2016.  No changes today.

## 2016-03-25 NOTE — Progress Notes (Signed)
Of course!

## 2016-03-29 ENCOUNTER — Other Ambulatory Visit: Payer: Self-pay

## 2016-03-29 NOTE — Progress Notes (Signed)
Cardiology Office Note    Date:  03/31/2016   ID:  Levi Lewis, DOB [birth year], MRN [id number]  PCP:  Criselda Peaches, MD  Cardiologist:  Dr. Marlou Porch  CC: post hospital follow up.   History of Present Illness:  Levi Lewis is a 82 y.o. male with a history of paroxysmal atrial fibrillation, SSS s/p PPM, coronary artery disease (by CT scan), and renal insufficiency who presents for post hospital follow up.   He was recently admitted 11/28-12/3/17 for acute on chronic systolic congestive heart failure and recurrent atrial fibrillation. BNP at time of admit was 1,097. Admit weight was 186 lb. He was treated with IV lasix and had a good diuretic response. Net I/Os -7L total. He was diuresed down to 172 lb. He was transitioned to PO lasix. He was discharged on 80 mg BID. Renal function improved by discharge at 1.90. EF remained low but stable at 20-25% on echo. He has a history of paroxysmal atrial fibrillation. Amiodarone was discontinued previously as there was concern it was causing pulmonary toxicity. It was felt that his recurrent atrial fibrillation was likely contributing to his CHF symptoms. Unfortunately, options for antiarrhythmics limited by EF, CAD and renal function. It was recommended to continue rate control and anticoagulation. He was continued on carvedilol 6.25 mg twice a day and continued on apixaban.  Corevue interrogation showed he is around his baseline fluid status currently. Afib 88% vs <1% on 02/16/16.  Today he presents to clinic for follow up. Still feeling a little SOB but much better than before admission. No CP. No LE edema or PND. He sleeps on one pillow. Sometimes gets a little dizzy but no syncope. No blood in stool or urine. Feels a little fatigued.  He doesn't eat salt and weighs daily.    Past Medical History:  Diagnosis Date  . Aortic insufficiency   . Arthritis    "back, knees, right leg" (03/15/2016)  . Atrial fibrillation ([place])   . CAD (coronary  artery disease)   . Cardiomyopathy, EF by echo 25-30% 07/14/2013  . CHF (congestive heart failure) ([place])   . Chronic lower back pain   . COPD (chronic obstructive pulmonary disease) ([place])   . Dyslipidemia   . GERD (gastroesophageal reflux disease)   . Gout   . Hypertension   . Hypothyroidism   . Left bundle branch block   . Lung nodules   . Mitral regurgitation   . On home oxygen therapy    "2L; 24/7 for the last week" (03/15/2016)  . Pacemaker   . Pneumonia    "2-3 times" (03/15/2016)  . RBBB   . Shortness of breath   . Shoulder pain    left; limited ROM  . Skin cancer    "left side of my nose; had another one maybe off my back" (03/15/2016)    Past Surgical History:  Procedure Laterality Date  . BACK SURGERY    . BI-VENTRICULAR PACEMAKER INSERTION (CRT-P)  07-15-2013   STJ Quadra Allura CRTP implanted by Dr Caryl Comes for NICM, CHF, alternating bundle branch blocks  . CARDIOVERSION N/A 09/13/2013   Procedure: CARDIOVERSION;  Surgeon: Carlena Bjornstad, MD;  Location: [facility] ENDOSCOPY;  Service: Cardiovascular;  Laterality: N/A;  . CARDIOVERSION N/A 10/17/2013   Procedure: CARDIOVERSION;  Surgeon: Lelon Perla, MD;  Location: [facility] ENDOSCOPY;  Service: Cardiovascular;  Laterality: N/A;  . CATARACT EXTRACTION W/ INTRAOCULAR LENS  IMPLANT, BILATERAL Bilateral   . CORONARY ANGIOPLASTY    .  INSERT / REPLACE / REMOVE PACEMAKER    . KNEE ARTHROSCOPY Right   . LUMBAR DISC SURGERY    . PERMANENT PACEMAKER INSERTION N/A 07/15/2013   Procedure: PERMANENT PACEMAKER INSERTION;  Surgeon: Deboraha Sprang, MD;  Location: [facility] CATH LAB;  Service: Cardiovascular;  Laterality: N/A;  . SHOULDER ARTHROSCOPY Right   . THROAT SURGERY  ~ 1950   "exploratory; felt like I had something in my throat & they went in to look around"  . TONSILLECTOMY      Current Medications: Outpatient Medications Prior to Visit  Medication Sig Dispense Refill  . carvedilol (COREG) 6.25 MG tablet Take 1 tablet (6.25 mg total) by  mouth 2 (two) times daily with a meal. 60 tablet 5  . ELIQUIS 2.5 MG TABS tablet TAKE 1 TABLET (2.5 MG TOTAL) BY MOUTH 2 (TWO) TIMES DAILY. 180 tablet 2  . fenofibrate (TRICOR) 48 MG tablet Take 0.5 tablets (24 mg total) by mouth daily. 45 tablet 1  . fish oil-omega-3 fatty acids 1000 MG capsule Take 2 g by mouth daily.     . furosemide (LASIX) 80 MG tablet Take 1 tablet (80 mg total) by mouth 2 (two) times daily. 60 tablet 0  . gabapentin (NEURONTIN) 300 MG capsule Take 300 mg by mouth 2 (two) times daily.  12  . guaiFENesin (MUCINEX) 600 MG 12 hr tablet Take 600 mg by mouth 2 (two) times daily as needed for cough or to loosen phlegm (COLDS).     Marland Kitchen levothyroxine (SYNTHROID, LEVOTHROID) 75 MCG tablet Take 1 tablet (75 mcg total) by mouth daily. 30 tablet 6  . Multiple Vitamins-Minerals (OCUVITE PO) Take 2 tablets by mouth daily.     Marland Kitchen omeprazole (PRILOSEC) 20 MG capsule Take 20 mg by mouth daily.     . predniSONE (DELTASONE) 10 MG tablet Take 5 mg by mouth daily.  12  . tamsulosin (FLOMAX) 0.4 MG CAPS capsule Take 0.4 mg by mouth 2 (two) times daily.     . triazolam (HALCION) 0.25 MG tablet Take 0.5 tablets (0.125 mg total) by mouth at bedtime as needed for sleep. 15 tablet 0  . colchicine 0.6 MG tablet Take 1 tablet (0.6 mg total) by mouth 2 (two) times daily. 10 tablet 0  . furosemide (LASIX) 80 MG tablet Take 80 mg by mouth 2 (two) times daily.     No facility-administered medications prior to visit.      Allergies:   Patient has no known allergies.   Social History   Social History  . Marital status: Married    Spouse name: N/A  . Number of children: N/A  . Years of education: N/A   Social History Main Topics  . Smoking status: Never Smoker  . Smokeless tobacco: Never Used  . Alcohol use No  . Drug use: No  . Sexual activity: Not Currently   Other Topics Concern  . None   Social History Narrative   He has never smoked or drank. No illcit drug use . He is retired from the  [employer] and lives with his wife. Illicit Drug Use- no     Family History:  The patient's family history includes Heart attack in his sister; Lung cancer in his brother.     ROS:   Please see the history of present illness.    ROS All other systems reviewed and are negative.   PHYSICAL EXAM:   VS:  BP (!) 126/54   Pulse 74   Ht  5\' 10"  (1.778 m)   Wt 163 lb 6 oz (74.1 kg)   BMI 23.44 kg/m    GEN: Well nourished, well developed, in no acute distress  HEENT: normal  Neck: no JVD, carotid bruits, or masses Cardiac: irreg irreg; no murmurs, rubs, or gallops,no edema  Respiratory:  clear to auscultation bilaterally, normal work of breathing GI: soft, nontender, nondistended, + BS MS: no deformity or atrophy  Skin: warm and dry, no rash Neuro:  Alert and Oriented x 3, Strength and sensation are intact Psych: euthymic mood, full affect  Wt Readings from Last 3 Encounters:  03/31/16 163 lb 6 oz (74.1 kg)  03/20/16 172 lb (78 kg)  03/15/16 190 lb 12.8 oz (86.5 kg)      Studies/Labs Reviewed:   EKG:  EKG is ordered today.  The ekg ordered today demonstrates afib with V pacing.  Recent Labs: 03/15/2016: ALT 19; B Natriuretic Peptide 1,097.9; Magnesium 2.3; TSH 2.055 03/19/2016: Hemoglobin 11.8; Platelets 350 03/20/2016: BUN 37; Creatinine, Ser 1.90; Potassium 3.9; Sodium 141    Additional studies/ records that were reviewed today include:  2D echo 03/17/16 Study Conclusions - Left ventricle: The cavity size was mildly dilated. Systolic function was severely reduced. The estimated ejection fraction was in the range of 20% to 25%. Doppler parameters are consistent with restrictive physiology, indicative of decreased left ventricular diastolic compliance and/or increased left atrial pressure. Doppler parameters are consistent with elevated ventricular end-diastolic filling pressure. - Ventricular septum: Septal motion showed paradox. - Aortic valve: Trileaflet;  mildly thickened, mildly calcified leaflets. There was mild regurgitation. - Mitral valve: Structurally normal valve. There was moderate regurgitation. - Left atrium: The atrium was mildly dilated. - Right ventricle: Systolic function was normal. - Right atrium: The atrium was normal in size. - Tricuspid valve: There was moderate regurgitation. - Pulmonic valve: Structurally normal valve. There was no regurgitation. - Pulmonary arteries: Systolic pressure was mildly increased. PA peak pressure: 38 mm Hg (S). - Pericardium, extracardiac: There was no pericardial effusion. Impressions: - Severely reduced LVEF with diffuse hypokinesis, paradoxical septal motion and akinetic apical segments. There is no thrombus. Restrictive pattern of diastolic dysfunction.   ASSESSMENT & PLAN:   1. Acute systolic congestive heart failure:  EF remained low but stable at 20-25% on echo. Discharge weight 172 lb. Today 162 but weighed 173 at home today.   -- Continue Coreg and Lasix 80mg  BID. No ACE/ARB 2/2 CKD.  -- Afib felt to be contributing. Was planning to perform DCCV but he missed one dose of Eliquis earlier this week. Feeling okay so will plan for rate control and close follow up.  2 Atrial fibrillation: patient with history of paroxysmal atrial fibrillation. Amiodarone was discontinued previously as there was concern it was causing pulmonary toxicity. It was felt that his recurrent atrial fibrillation was likely contributing to his CHF symptoms. Unfortunately, options for antiarrhythmics limited by EF, CAD and renal function. It was recommended to continue rate control and anticoagulation. He was continued on carvedilol 6.25 mg twice a day. Continue apixaban 2.5mg  BID for CHADSvasc 5. Was planning to perform DCCV but he missed one dose of Eliquis earlier this week. Feeling okay so will plan for rate control and close followup.  3. Chronic stage IV kidney disease: Scr day of d/c was 1.90.  Check a BMET today  4. Pacemaker: follow-up in device clinic.  6 COPD: stable on home meds   Medication Adjustments/Labs and Tests Ordered: Current medicines are reviewed at length with  the patient today.  Concerns regarding medicines are outlined above.  Medication changes, Labs and Tests ordered today are listed in the Patient Instructions below. Patient Instructions  Medication Instructions:  Your physician recommends that you continue on your current medications as directed. Please refer to the Current Medication list given to you today.   Labwork: TODAY:  BMET  Testing/Procedures: None ordered  Follow-Up: Your physician recommends that you schedule a follow-up appointment in: 04/27/16 ARRIVE AT 1:15 for a 1:30 APPT WITH KATIE Nelson Julson, PA-C   Any Other Special Instructions Will Be Listed Below (If Applicable).   If you need a refill on your cardiac medications before your next appointment, please call your pharmacy.      Signed, Angelena Form, PA-C  03/31/2016 7:17 PM    [facility] [place], [place], [place]  [address] Phone: [phone]; Fax: [phone]

## 2016-03-30 ENCOUNTER — Encounter: Payer: Self-pay | Admitting: Cardiology

## 2016-03-30 NOTE — Patient Outreach (Signed)
Levi Lewis Levi Lewis) Care Management   03/30/2016  Levi Lewis 24-May-1917 [id number]  Levi Lewis is an 82 y.o. male  Subjective:  I weigh everyday and write it down I have all my medicines I watch the salt I use.  I stay away from it  Objective:   ROS  Very frail, elderly man, Very friendly readily responded to assessment questions  Physical Exam  ROS Right lower lung crackles  Encounter Medications:   Outpatient Encounter Prescriptions as of 03/29/2016  Medication Sig  . carvedilol (COREG) 6.25 MG tablet Take 1 tablet (6.25 mg total) by mouth 2 (two) times daily with a meal.  . colchicine 0.6 MG tablet Take 1 tablet (0.6 mg total) by mouth 2 (two) times daily.  Marland Kitchen ELIQUIS 2.5 MG TABS tablet TAKE 1 TABLET (2.5 MG TOTAL) BY MOUTH 2 (TWO) TIMES DAILY.  . fenofibrate (TRICOR) 48 MG tablet Take 0.5 tablets (24 mg total) by mouth daily.  . fish oil-omega-3 fatty acids 1000 MG capsule Take 2 g by mouth daily.   . furosemide (LASIX) 80 MG tablet Take 80 mg by mouth 2 (two) times daily.  . furosemide (LASIX) 80 MG tablet Take 1 tablet (80 mg total) by mouth 2 (two) times daily.  Marland Kitchen gabapentin (NEURONTIN) 300 MG capsule Take 300 mg by mouth 2 (two) times daily.  Marland Kitchen guaiFENesin (MUCINEX) 600 MG 12 hr tablet Take 600 mg by mouth 2 (two) times daily as needed for cough or to loosen phlegm (COLDS).   Marland Kitchen levothyroxine (SYNTHROID, LEVOTHROID) 75 MCG tablet Take 1 tablet (75 mcg total) by mouth daily.  . Multiple Vitamins-Minerals (OCUVITE PO) Take 2 tablets by mouth daily.   Marland Kitchen omeprazole (PRILOSEC) 20 MG capsule Take 20 mg by mouth daily.   . predniSONE (DELTASONE) 10 MG tablet Take 5 mg by mouth daily.  . tamsulosin (FLOMAX) 0.4 MG CAPS capsule Take 0.4 mg by mouth 2 (two) times daily.   . triazolam (HALCION) 0.25 MG tablet Take 0.5 tablets (0.125 mg total) by mouth at bedtime as needed for sleep.   No facility-administered encounter medications on file as of 03/29/2016.      Functional Status:   In your present state of health, do you have any difficulty performing the following activities: 03/21/2016 03/15/2016  Hearing? Levi Lewis  Vision? Y Y  Difficulty concentrating or making decisions? Y N  Walking or climbing stairs? Y Y  Dressing or bathing? Y Y  Doing errands, shopping? Levi Lewis  Some recent data might be hidden    Fall/Depression Screening:    PHQ 2/9 Scores 03/21/2016  PHQ - 2 Score 0   THN CM Care Plan Problem One   Flowsheet Row Most Recent Value  Care Plan Problem One  Patient was discharged from the acute care setting on 12/3  Role Documenting the Problem One  Care Management Levi Lewis for Problem One  Active  THN Long Term Goal (31-90 days)  Patient will have no acute care admissions in the next 31 days.  THN Long Term Goal Start Date  03/21/16  Interventions for Problem One Long Term Goal  Initilal home visit to assess barriers which may prevent rehospitalization  THN CM Short Term Goal #1 (0-30 days)  in the next 14 days, patient will meet with [facility] RNCM  for heart failure education  [facility] CM Short Term Goal #1 Start Date  03/21/16  [facility] CM Short Term Goal #1 Met Date  03/29/16  Interventions for Short Term Goal #1  Initial home visit to for HF Education.  Patient, his wife and granddaughter, Levi Lewis, were present to view EMMI video on HF and Management of HF  THN CM Short Term Goal #2 (0-30 days)  In the next 14 days, patient will meet with Levi Lewis RNCM for further assessment of community care coordination needs  [facility] CM Short Term Goal #2 Start Date  03/21/16  [facility] CM Short Term Goal #2 Met Date  03/29/16  Interventions for Short Term Goal #2  Initial home visit to assess needs    Levi Lewis CM Care Plan Problem Two   Flowsheet Row Most Recent Value  Care Plan Problem Two  knowledge defiict related to   Role Documenting the Problem Two  Care Management Coordinator  Care Plan for Problem Two  Active  THN CM Short Term Goal #1 (0-30 days)   In the next 21 days, patient will meet with [facility] RNCM for heart failure education  [facility]i [facility] CM Short Term Goal #1 Start Date  03/21/16  [facility] CM Short Term Goal #1 Met Date   03/29/16  Interventions for Short Term Goal #2   Completed HF education, patient has extensive knowledge related to HF management      Fall Risk  03/21/2016  Falls in the past year? No  Risk for fall due to : Impaired balance/gait;Impaired mobility;Impaired vision;Medication side effect   Assessment:   Patient has very good family support, including his granddaughter [name], who works as Magazine features editor at [facility] Patient has scales for daily weights, is consistent about recording  Patient reports being able to afford medications, reports compliance with medication schedule. Patient has transportation to medical appointments, his family assist with transportation and shopping. Patient is primary caregiver to wife who has dementia. Patient states he has sufficient income to pay bills and afford foods.  Plan:  Telephone contact later this month for further assessment of community care coordination.

## 2016-03-31 ENCOUNTER — Encounter: Payer: Self-pay | Admitting: Physician Assistant

## 2016-03-31 ENCOUNTER — Ambulatory Visit (INDEPENDENT_AMBULATORY_CARE_PROVIDER_SITE_OTHER): Payer: Medicare Other | Admitting: Physician Assistant

## 2016-03-31 ENCOUNTER — Ambulatory Visit (INDEPENDENT_AMBULATORY_CARE_PROVIDER_SITE_OTHER): Payer: Medicare Other

## 2016-03-31 VITALS — BP 126/54 | HR 74 | Ht 70.0 in | Wt 163.4 lb

## 2016-03-31 DIAGNOSIS — I5022 Chronic systolic (congestive) heart failure: Secondary | ICD-10-CM

## 2016-03-31 DIAGNOSIS — R0609 Other forms of dyspnea: Secondary | ICD-10-CM

## 2016-03-31 DIAGNOSIS — I4819 Other persistent atrial fibrillation: Secondary | ICD-10-CM

## 2016-03-31 DIAGNOSIS — I481 Persistent atrial fibrillation: Secondary | ICD-10-CM

## 2016-03-31 DIAGNOSIS — Z950 Presence of cardiac pacemaker: Secondary | ICD-10-CM

## 2016-03-31 DIAGNOSIS — I255 Ischemic cardiomyopathy: Secondary | ICD-10-CM

## 2016-03-31 DIAGNOSIS — I10 Essential (primary) hypertension: Secondary | ICD-10-CM

## 2016-03-31 DIAGNOSIS — N189 Chronic kidney disease, unspecified: Secondary | ICD-10-CM

## 2016-03-31 LAB — BASIC METABOLIC PANEL
BUN: 36 mg/dL — ABNORMAL HIGH (ref 7–25)
CALCIUM: 8.9 mg/dL (ref 8.6–10.3)
CHLORIDE: 106 mmol/L (ref 98–110)
CO2: 23 mmol/L (ref 20–31)
Creat: 1.78 mg/dL — ABNORMAL HIGH (ref 0.70–1.11)
Glucose, Bld: 135 mg/dL — ABNORMAL HIGH (ref 65–99)
Potassium: 3.8 mmol/L (ref 3.5–5.3)
SODIUM: 141 mmol/L (ref 135–146)

## 2016-03-31 NOTE — Patient Instructions (Addendum)
Medication Instructions:  Your physician recommends that you continue on your current medications as directed. Please refer to the Current Medication list given to you today.   Labwork: TODAY:  BMET  Testing/Procedures: None ordered  Follow-Up: Your physician recommends that you schedule a follow-up appointment in: 04/27/16 ARRIVE AT 1:15 for a 1:30 APPT WITH KATIE THOMPSON, PA-C   Any Other Special Instructions Will Be Listed Below (If Applicable).   If you need a refill on your cardiac medications before your next appointment, please call your pharmacy.

## 2016-03-31 NOTE — Progress Notes (Signed)
EPIC Encounter for ICM Monitoring  Patient Name: Levi Lewis is a 82 y.o. male Date: 03/31/2016 Primary Care Physican: Criselda Peaches, MD Primary Cardiologist: Marlou Porch Electrophysiologist: Faustino Congress Weight:    unknown Bi-V Pacing:  71% (was >99% on 02/16/2016)      AT/AF Burden: 88 % (was <1% on 02/16/2016)            Spoke with son.  Heart Failure questions reviewed, pt asymptomatic   Thoracic impedance normal   Recommendations:   Reviewed with Dr Caryl Comes in the office and no changes.  He has ICM direct phone number and encouraged to call for fluid symptoms.    Follow-up plan: ICM clinic phone appointment on 04/26/2016.  Copy of ICM check sent to primary cardiologist and device physician.   ICM trend: 03/31/2016       Rosalene Billings, RN 03/31/2016 8:57 AM

## 2016-04-02 ENCOUNTER — Other Ambulatory Visit: Payer: Self-pay | Admitting: Cardiovascular Disease

## 2016-04-04 ENCOUNTER — Other Ambulatory Visit: Payer: Self-pay | Admitting: *Deleted

## 2016-04-04 MED ORDER — FUROSEMIDE 80 MG PO TABS: 80.0000 mg | ORAL_TABLET | Freq: Two times a day (BID) | ORAL | 3 refills | Status: DC

## 2016-04-05 ENCOUNTER — Other Ambulatory Visit: Payer: Self-pay

## 2016-04-05 NOTE — Patient Outreach (Signed)
[facility] [facility]) Care Management  04/05/2016   ACHERON SUGG [birth year] [id number]  Subjective:  I am doing okay.  I went to the doctor yesterday. I gained a pound since yesterday. I have a little ankle swelling on the left side Objective:   Current Medications:  Current Outpatient Prescriptions  Medication Sig Dispense Refill  . carvedilol (COREG) 6.25 MG tablet Take 1 tablet (6.25 mg total) by mouth 2 (two) times daily with a meal. 60 tablet 5  . ELIQUIS 2.5 MG TABS tablet TAKE 1 TABLET (2.5 MG TOTAL) BY MOUTH 2 (TWO) TIMES DAILY. 180 tablet 2  . fenofibrate (TRICOR) 48 MG tablet Take 0.5 tablets (24 mg total) by mouth daily. 45 tablet 1  . fish oil-omega-3 fatty acids 1000 MG capsule Take 2 g by mouth daily.     . furosemide (LASIX) 80 MG tablet Take 1 tablet (80 mg total) by mouth 2 (two) times daily. 180 tablet 3  . gabapentin (NEURONTIN) 300 MG capsule Take 300 mg by mouth 2 (two) times daily.  12  . guaiFENesin (MUCINEX) 600 MG 12 hr tablet Take 600 mg by mouth 2 (two) times daily as needed for cough or to loosen phlegm (COLDS).     Marland Kitchen levothyroxine (SYNTHROID, LEVOTHROID) 75 MCG tablet Take 1 tablet (75 mcg total) by mouth daily. 30 tablet 6  . Multiple Vitamins-Minerals (OCUVITE PO) Take 2 tablets by mouth daily.     Marland Kitchen omeprazole (PRILOSEC) 20 MG capsule Take 20 mg by mouth daily.     . predniSONE (DELTASONE) 10 MG tablet Take 5 mg by mouth daily.  12  . tamsulosin (FLOMAX) 0.4 MG CAPS capsule Take 0.4 mg by mouth 2 (two) times daily.     . triazolam (HALCION) 0.25 MG tablet Take 0.5 tablets (0.125 mg total) by mouth at bedtime as needed for sleep. 15 tablet 0   No current facility-administered medications for this visit.     Functional Status:  In your present state of health, do you have any difficulty performing the following activities: 03/21/2016 03/15/2016  Hearing? [name]  Vision? Y Y  Difficulty concentrating or making decisions? Y N  Walking or  climbing stairs? Y Y  Dressing or bathing? Y Y  Doing errands, shopping? Y Y  Some recent data might be hidden    Fall/Depression Screening: PHQ 2/9 Scores 03/21/2016  PHQ - 2 Score 0   Fall Risk  03/21/2016  Falls in the past year? No  Risk for fall due to : Impaired balance/gait;Impaired mobility;Impaired vision;Medication side effect   THN CM Care Plan Problem One   Flowsheet Row Most Recent Value  Care Plan Problem One  Patient was discharged from the acute care setting on 12/3  Role Documenting the Problem One  Care Management [place] for Problem One  Active  THN Long Term Goal (31-90 days)  Patient will have no acute care admissions in the next 31 days.  THN Long Term Goal Start Date  03/21/16  Interventions for Problem One Long Term Goal   patient reports adhering to his heart failure plan, has had  no readmissions to acute care  THN CM Short Term Goal #1 (0-30 days)  in the next 14 days, patient will meet with [facility] RNCM  for heart failure education  [facility] CM Short Term Goal #1 Start Date  03/21/16  [facility] CM Short Term Goal #1 Met Date  03/29/16  Interventions for Short Term Goal #1  Initial home visit to for HF Education.  Patient, his wife and granddaughter, [place] Bing, were present to view EMMI video on HF and Management of HF  THN CM Short Term Goal #2 (0-30 days)  In the next 14 days, patient will meet with [facility] RNCM for further assessment of community care coordination needs  [facility] CM Short Term Goal #2 Start Date  03/21/16  [facility] CM Short Term Goal #2 Met Date  03/29/16  Interventions for Short Term Goal #2  Initial home visit to assess needs    [facility] CM Care Plan Problem Two   Flowsheet Row Most Recent Value  Care Plan Problem Two  knowledge defiict related to   Role Documenting the Problem Two  Care Management Coordinator  Care Plan for Problem Two  Active  THN CM Short Term Goal #1 (0-30 days)  In the next 21 days, patient will meet with [facility] RNCM for heart  failure education  [facility] CM Short Term Goal #1 Start Date  03/21/16  [facility] CM Short Term Goal #1 Met Date   03/29/16  Interventions for Short Term Goal #2   Completed HF education, patient has extensive knowledge related to HF management      Assessment:  Patient continues to adhere to his heart failure management plan Patient had appointment with  Primary care physician yesterday, reports getting a good report. Patient denies shortness of breath or increase in abdoominal girth.  Plan:  Telephone contact next week for community care coordination needs, assess his progress in reaching his case management goals

## 2016-04-11 ENCOUNTER — Encounter: Payer: Self-pay | Admitting: Cardiology

## 2016-04-15 ENCOUNTER — Other Ambulatory Visit: Payer: Self-pay

## 2016-04-15 NOTE — Patient Outreach (Signed)
[facility]) Care Management  04/15/2016   Levi Lewis August 04, 1917 [id number]  Subjective:  I am doing fine, I had some problems with diarrhea but that is better now. I had some selling in my legs but that went down. I am still weighing every day, I have some shortness of breath when I get up and walk around a lot.  Objective:  Telephonic encounter  Current Medications:  Current Outpatient Prescriptions  Medication Sig Dispense Refill  . carvedilol (COREG) 6.25 MG tablet Take 1 tablet (6.25 mg total) by mouth 2 (two) times daily with a meal. 60 tablet 5  . ELIQUIS 2.5 MG TABS tablet TAKE 1 TABLET (2.5 MG TOTAL) BY MOUTH 2 (TWO) TIMES DAILY. 180 tablet 2  . fenofibrate (TRICOR) 48 MG tablet Take 0.5 tablets (24 mg total) by mouth daily. 45 tablet 1  . fish oil-omega-3 fatty acids 1000 MG capsule Take 2 g by mouth daily.     . furosemide (LASIX) 80 MG tablet Take 1 tablet (80 mg total) by mouth 2 (two) times daily. 180 tablet 3  . gabapentin (NEURONTIN) 300 MG capsule Take 300 mg by mouth 2 (two) times daily.  12  . guaiFENesin (MUCINEX) 600 MG 12 hr tablet Take 600 mg by mouth 2 (two) times daily as needed for cough or to loosen phlegm (COLDS).     Marland Kitchen levothyroxine (SYNTHROID, LEVOTHROID) 75 MCG tablet Take 1 tablet (75 mcg total) by mouth daily. 30 tablet 6  . Multiple Vitamins-Minerals (OCUVITE PO) Take 2 tablets by mouth daily.     Marland Kitchen omeprazole (PRILOSEC) 20 MG capsule Take 20 mg by mouth daily.     . predniSONE (DELTASONE) 10 MG tablet Take 5 mg by mouth daily.  12  . tamsulosin (FLOMAX) 0.4 MG CAPS capsule Take 0.4 mg by mouth 2 (two) times daily.     . triazolam (HALCION) 0.25 MG tablet Take 0.5 tablets (0.125 mg total) by mouth at bedtime as needed for sleep. 15 tablet 0   No current facility-administered medications for this visit.     Functional Status:  In your present state of health, do you have any difficulty performing the following activities:  03/21/2016 03/15/2016  Hearing? [name]  Vision? Y Y  Difficulty concentrating or making decisions? Y N  Walking or climbing stairs? Y Y  Dressing or bathing? Y Y  Doing errands, shopping? Y Y  Some recent data might be hidden    Fall/Depression Screening: PHQ 2/9 Scores 03/21/2016  PHQ - 2 Score 0   Fall Risk  03/21/2016  Falls in the past year? No  Risk for fall due to : Impaired balance/gait;Impaired mobility;Impaired vision;Medication side effect    THN CM Care Plan Problem One   Flowsheet Row Most Recent Value  Care Plan Problem One  Patient was discharged from the acute care setting on 12/3  Role Documenting the Problem One  Care Management [place] for Problem One  Active  THN Long Term Goal (31-90 days)  Patient will have no acute care admissions in the next 31 days.  THN Long Term Goal Start Date  03/21/16  Interventions for Problem One Long Term Goal   patient reports adhering to his heart failure plan, has had  no readmissions to acute care  THN CM Short Term Goal #1 (0-30 days)  in the next 14 days, patient will meet with [facility] RNCM  for heart failure education  [facility] CM Short Term Goal #  1 Start Date  03/21/16  [facility] CM Short Term Goal #1 Met Date  03/29/16  Interventions for Short Term Goal #1  Initial home visit to for HF Education.  Patient, his wife and granddaughter, [place] Bing, were present to view EMMI video on HF and Management of HF  THN CM Short Term Goal #2 (0-30 days)  In the next 14 days, patient will meet with [facility] RNCM for further assessment of community care coordination needs  [facility] CM Short Term Goal #2 Start Date  03/21/16  [facility] CM Short Term Goal #2 Met Date  03/29/16  Interventions for Short Term Goal #2  Initial home visit to assess needs    [facility] CM Care Plan Problem Two   Flowsheet Row Most Recent Value  Care Plan Problem Two  knowledge defiict related to   Role Documenting the Problem Two  Care Management Coordinator  Care Plan for Problem Two   Active  THN CM Short Term Goal #1 (0-30 days)  In the next 21 days, patient will meet with [facility] RNCM for heart failure education  [facility] CM Short Term Goal #1 Start Date  03/21/16  [facility] CM Short Term Goal #1 Met Date   03/29/16  Interventions for Short Term Goal #2   Completed HF education, patient has extensive knowledge related to HF management       Assessment:  Patient continues to be compliant with heart failure treatment plan. Patient reports having an appointment with his primary care physician today, reports all went well. Patient is progressing in meeting his care management goals  Plan:  Telephone contact in the next 30 days for assessment of his compliance with heart failure and chronic disease treatment plans

## 2016-04-22 ENCOUNTER — Other Ambulatory Visit: Payer: Self-pay

## 2016-04-24 NOTE — Patient Outreach (Signed)
[facility]) Care Management  04/22/2016  CODEY BURLING 03-Nov-1917 [id number]  Subjective:  I have been doing okay.  I have gained some weight but I haven't gained a lot.  Objective:  Telephonic encounter  Current Medications:  Current Outpatient Prescriptions  Medication Sig Dispense Refill  . carvedilol (COREG) 6.25 MG tablet Take 1 tablet (6.25 mg total) by mouth 2 (two) times daily with a meal. 60 tablet 5  . ELIQUIS 2.5 MG TABS tablet TAKE 1 TABLET (2.5 MG TOTAL) BY MOUTH 2 (TWO) TIMES DAILY. 180 tablet 2  . fenofibrate (TRICOR) 48 MG tablet Take 0.5 tablets (24 mg total) by mouth daily. 45 tablet 1  . fish oil-omega-3 fatty acids 1000 MG capsule Take 2 g by mouth daily.     . furosemide (LASIX) 80 MG tablet Take 1 tablet (80 mg total) by mouth 2 (two) times daily. 180 tablet 3  . gabapentin (NEURONTIN) 300 MG capsule Take 300 mg by mouth 2 (two) times daily.  12  . guaiFENesin (MUCINEX) 600 MG 12 hr tablet Take 600 mg by mouth 2 (two) times daily as needed for cough or to loosen phlegm (COLDS).     Marland Kitchen levothyroxine (SYNTHROID, LEVOTHROID) 75 MCG tablet Take 1 tablet (75 mcg total) by mouth daily. 30 tablet 6  . Multiple Vitamins-Minerals (OCUVITE PO) Take 2 tablets by mouth daily.     Marland Kitchen omeprazole (PRILOSEC) 20 MG capsule Take 20 mg by mouth daily.     . predniSONE (DELTASONE) 10 MG tablet Take 5 mg by mouth daily.  12  . tamsulosin (FLOMAX) 0.4 MG CAPS capsule Take 0.4 mg by mouth 2 (two) times daily.     . triazolam (HALCION) 0.25 MG tablet Take 0.5 tablets (0.125 mg total) by mouth at bedtime as needed for sleep. 15 tablet 0   No current facility-administered medications for this visit.     Functional Status:  In your present state of health, do you have any difficulty performing the following activities: 03/21/2016 03/15/2016  Hearing? [name]  Vision? Y Y  Difficulty concentrating or making decisions? Y N  Walking or climbing stairs? Y Y  Dressing or  bathing? Y Y  Doing errands, shopping? [name]  Some recent data might be hidden    Fall/Depression Screening: PHQ 2/9 Scores 03/21/2016  PHQ - 2 Score 0   THN CM Care Plan Problem One   Flowsheet Row Most Recent Value  Care Plan Problem One  Patient was discharged from the acute care setting on 12/3  Role Documenting the Problem One  Care Management [place] for Problem One  Active  THN Long Term Goal (31-90 days)  Patient will have no acute care admissions in the next 31 days.  THN Long Term Goal Start Date  03/21/16  THN Long Term Goal Met Date  04/22/16  Interventions for Problem One Long Term Goal   patient reports adhering to his heart failure plan, has had  no readmissions to acute care  THN CM Short Term Goal #1 (0-30 days)  in the next 14 days, patient will meet with [facility] RNCM  for heart failure education  [facility] CM Short Term Goal #1 Start Date  03/21/16  [facility] CM Short Term Goal #1 Met Date  03/29/16  Interventions for Short Term Goal #1  Initial home visit to for HF Education.  Patient, his wife and granddaughter, [place] Bing, were present to view EMMI video on HF and Management  of HF  THN CM Short Term Goal #2 (0-30 days)  In the next 14 days, patient will meet with [facility] RNCM for further assessment of community care coordination needs  [facility] CM Short Term Goal #2 Start Date  03/21/16  [facility] CM Short Term Goal #2 Met Date  03/29/16  Interventions for Short Term Goal #2  Initial home visit to assess needs    [facility] CM Care Plan Problem Two   Flowsheet Row Most Recent Value  Care Plan Problem Two  knowledge defiict related to   Role Documenting the Problem Two  Care Management Coordinator  Care Plan for Problem Two  Active  THN CM Short Term Goal #1 (0-30 days)  In the next 21 days, patient will meet with [facility] RNCM for heart failure education  [facility] CM Short Term Goal #1 Start Date  03/21/16  [facility] CM Short Term Goal #1 Met Date   03/29/16  Interventions for Short Term Goal #2    Completed HF education, patient has extensive knowledge related to HF management      Assessment:  Patient has met his case management goals. Patient reports continuing to weigh daily, adhering  To a low sodium diet. Patient also reports haiving no swelling or shortness of breath. Patient has agreed to discharge due to meeting all his case management goals. Patient has [facility] contact information if further case management assistance is needed.   Plan:  Discharge from caseload by sending case closure letter to patient and his primary care physician.

## 2016-04-25 LAB — CUP PACEART REMOTE DEVICE CHECK
Battery Remaining Longevity: 100 mo
Battery Remaining Percentage: 95.5 %
Brady Statistic AS VS Percent: 0 %
Brady Statistic RA Percent Paced: 1.9 %
Implantable Lead Implant Date: 20150330
Implantable Lead Location: 753860
Implantable Pulse Generator Implant Date: 20150330
Lead Channel Impedance Value: 410 Ohm
Lead Channel Impedance Value: 550 Ohm
Lead Channel Pacing Threshold Amplitude: 0.75 V
Lead Channel Pacing Threshold Amplitude: 1 V
Lead Channel Pacing Threshold Pulse Width: 0.6 ms
Lead Channel Sensing Intrinsic Amplitude: 0.2 mV
Lead Channel Setting Pacing Amplitude: 2.5 V
Lead Channel Setting Sensing Sensitivity: 2 mV
MDC IDC LEAD IMPLANT DT: 20150330
MDC IDC LEAD IMPLANT DT: 20150330
MDC IDC LEAD LOCATION: 753858
MDC IDC LEAD LOCATION: 753859
MDC IDC MSMT BATTERY VOLTAGE: 2.98 V
MDC IDC MSMT LEADCHNL LV PACING THRESHOLD AMPLITUDE: 0.75 V
MDC IDC MSMT LEADCHNL LV PACING THRESHOLD PULSEWIDTH: 0.4 ms
MDC IDC MSMT LEADCHNL RV IMPEDANCE VALUE: 410 Ohm
MDC IDC MSMT LEADCHNL RV PACING THRESHOLD PULSEWIDTH: 0.4 ms
MDC IDC MSMT LEADCHNL RV SENSING INTR AMPL: 12 mV
MDC IDC PG MODEL: 3242
MDC IDC SESS DTM: 20171205070015
MDC IDC SET LEADCHNL LV PACING AMPLITUDE: 1.5 V
MDC IDC SET LEADCHNL LV PACING PULSEWIDTH: 0.4 ms
MDC IDC SET LEADCHNL RV PACING AMPLITUDE: 2 V
MDC IDC SET LEADCHNL RV PACING PULSEWIDTH: 0.4 ms
MDC IDC STAT BRADY AP VP PERCENT: 9.2 %
MDC IDC STAT BRADY AP VS PERCENT: 0 %
MDC IDC STAT BRADY AS VP PERCENT: 91 %
Pulse Gen Serial Number: 2978403

## 2016-04-26 ENCOUNTER — Ambulatory Visit (INDEPENDENT_AMBULATORY_CARE_PROVIDER_SITE_OTHER): Payer: Medicare Other

## 2016-04-26 DIAGNOSIS — Z950 Presence of cardiac pacemaker: Secondary | ICD-10-CM | POA: Diagnosis not present

## 2016-04-26 DIAGNOSIS — I5022 Chronic systolic (congestive) heart failure: Secondary | ICD-10-CM

## 2016-04-26 NOTE — Progress Notes (Signed)
Cardiology Office Note    Date:  04/27/2016   ID:  Levi Lewis, DOB [birth year], MRN [id number]  PCP:  Criselda Peaches, MD  Cardiologist:  Dr. Marlou Porch  CC: follow up   History of Present Illness:  Levi Lewis is a 83 y.o. male with a history of persistnat atrial fibrillation, SSS s/p PPM, coronary artery disease (by CT scan), and renal insufficiency who presents to clinic for follow up.   He was admitted 11/28-12/3/17 for acute on chronic systolic congestive heart failure and recurrent atrial fibrillation. BNP at time of admit was 1,097.  Diruesed 14 lbs (186 -->172). He was discharged on 80 mg BID.Renal function improved by discharge at 1.90. EF remained low but stable at20-25% on echo. He has a history of paroxysmal atrial fibrillation. Amiodarone was discontinued previously as there was concern it was causing pulmonary toxicity. It was felt that his recurrent atrial fibrillation was likely contributing to his CHF symptoms. Unfortunately, options for antiarrhythmics are limited by EF, CAD and renal function. It was recommended to continuerate control and anticoagulation. He was continued oncarvedilol 6.25 mg twice a day and continued on apixaban.  I saw him in clinic on 03/31/16 for hosp follow up. Corevue interrogation showed he was around his baseline fluid status currently. Afib 88% vs <1% on 02/16/16.   Today he presents to clinic for follow up. No CP or SOB. No LE edema, orthopnea or PND. No dizziness or syncope. No blood in stool or urine. He occasionally gets palpitations with a don't bother him. He gets some mild dyspnea with exertion but otherwise is feeling pretty good. He has a birthday in 2 weeks in terms 12. Cor view interrogation shows that his impedance is at baseline and A. fib burden is 96%   Past Medical History:  Diagnosis Date  . Aortic insufficiency   . Arthritis    "back, knees, right leg" (03/15/2016)  . Atrial fibrillation ([place])   . CAD (coronary artery  disease)   . Cardiomyopathy, EF by echo 25-30% 07/14/2013  . CHF (congestive heart failure) ([place])   . Chronic lower back pain   . COPD (chronic obstructive pulmonary disease) ([place])   . Dyslipidemia   . GERD (gastroesophageal reflux disease)   . Gout   . Hypertension   . Hypothyroidism   . Left bundle branch block   . Lung nodules   . Mitral regurgitation   . On home oxygen therapy    "2L; 24/7 for the last week" (03/15/2016)  . Pacemaker   . Pneumonia    "2-3 times" (03/15/2016)  . RBBB   . Shortness of breath   . Shoulder pain    left; limited ROM  . Skin cancer    "left side of my nose; had another one maybe off my back" (03/15/2016)    Past Surgical History:  Procedure Laterality Date  . BACK SURGERY    . BI-VENTRICULAR PACEMAKER INSERTION (CRT-P)  07-15-2013   STJ Quadra Allura CRTP implanted by Dr Caryl Comes for NICM, CHF, alternating bundle branch blocks  . CARDIOVERSION N/A 09/13/2013   Procedure: CARDIOVERSION;  Surgeon: Carlena Bjornstad, MD;  Location: [facility] ENDOSCOPY;  Service: Cardiovascular;  Laterality: N/A;  . CARDIOVERSION N/A 10/17/2013   Procedure: CARDIOVERSION;  Surgeon: Lelon Perla, MD;  Location: [facility] ENDOSCOPY;  Service: Cardiovascular;  Laterality: N/A;  . CATARACT EXTRACTION W/ INTRAOCULAR LENS  IMPLANT, BILATERAL Bilateral   . CORONARY ANGIOPLASTY    . INSERT / REPLACE /  REMOVE PACEMAKER    . KNEE ARTHROSCOPY Right   . LUMBAR DISC SURGERY    . PERMANENT PACEMAKER INSERTION N/A 07/15/2013   Procedure: PERMANENT PACEMAKER INSERTION;  Surgeon: Deboraha Sprang, MD;  Location: [facility] CATH LAB;  Service: Cardiovascular;  Laterality: N/A;  . SHOULDER ARTHROSCOPY Right   . THROAT SURGERY  ~ 1950   "exploratory; felt like I had something in my throat & they went in to look around"  . TONSILLECTOMY      Current Medications: Outpatient Medications Prior to Visit  Medication Sig Dispense Refill  . carvedilol (COREG) 6.25 MG tablet Take 1 tablet (6.25 mg total) by mouth 2  (two) times daily with a meal. 60 tablet 5  . ELIQUIS 2.5 MG TABS tablet TAKE 1 TABLET (2.5 MG TOTAL) BY MOUTH 2 (TWO) TIMES DAILY. 180 tablet 2  . fenofibrate (TRICOR) 48 MG tablet Take 0.5 tablets (24 mg total) by mouth daily. 45 tablet 1  . fish oil-omega-3 fatty acids 1000 MG capsule Take 2 g by mouth daily.     . furosemide (LASIX) 80 MG tablet Take 1 tablet (80 mg total) by mouth 2 (two) times daily. 180 tablet 3  . gabapentin (NEURONTIN) 300 MG capsule Take 300 mg by mouth 2 (two) times daily.  12  . guaiFENesin (MUCINEX) 600 MG 12 hr tablet Take 600 mg by mouth 2 (two) times daily as needed for cough or to loosen phlegm (COLDS).     Marland Kitchen levothyroxine (SYNTHROID, LEVOTHROID) 75 MCG tablet Take 1 tablet (75 mcg total) by mouth daily. 30 tablet 6  . Multiple Vitamins-Minerals (OCUVITE PO) Take 2 tablets by mouth daily.     Marland Kitchen omeprazole (PRILOSEC) 20 MG capsule Take 20 mg by mouth daily.     . predniSONE (DELTASONE) 10 MG tablet Take 5 mg by mouth daily.  12  . tamsulosin (FLOMAX) 0.4 MG CAPS capsule Take 0.4 mg by mouth 2 (two) times daily.     . triazolam (HALCION) 0.25 MG tablet Take 0.5 tablets (0.125 mg total) by mouth at bedtime as needed for sleep. 15 tablet 0   No facility-administered medications prior to visit.      Allergies:   Patient has no known allergies.   Social History   Social History  . Marital status: Married    Spouse name: N/A  . Number of children: N/A  . Years of education: N/A   Social History Main Topics  . Smoking status: Never Smoker  . Smokeless tobacco: Never Used  . Alcohol use No  . Drug use: No  . Sexual activity: Not Currently   Other Topics Concern  . None   Social History Narrative   He has never smoked or drank. No illcit drug use . He is retired from the [employer] and lives with his wife. Illicit Drug Use- no     Family History:  The patient's family history includes Heart attack in his sister; Lung cancer in his brother.      ROS:   Please see the history of present illness.    ROS All other systems reviewed and are negative.   PHYSICAL EXAM:   VS:  BP 130/62 (BP Location: Left Arm, Patient Position: Sitting, Cuff Size: Normal)   Pulse 68   Ht 5\' 10"  (1.778 m)   Wt 174 lb (78.9 kg)   BMI 24.97 kg/m    GEN: Well nourished, well developed, in no acute distress, elderly and frail  HEENT: normal  Neck: no JVD, carotid bruits, or masses Cardiac: RRR; no murmurs, rubs, or gallops,no edema  Respiratory:  clear to auscultation bilaterally, normal work of breathing GI: soft, nontender, nondistended, + BS MS: no deformity or atrophy  Skin: warm and dry, no rash Neuro:  Alert and Oriented x 3, Strength and sensation are intact Psych: euthymic mood, full affect    Wt Readings from Last 3 Encounters:  04/27/16 174 lb (78.9 kg)  03/31/16 163 lb 6 oz (74.1 kg)  03/20/16 172 lb (78 kg)      Studies/Labs Reviewed:   EKG:  EKG is NOT ordered today.    Recent Labs: 03/15/2016: ALT 19; B Natriuretic Peptide 1,097.9; Magnesium 2.3; TSH 2.055 03/19/2016: Hemoglobin 11.8; Platelets 350 03/31/2016: BUN 36; Creat 1.78; Potassium 3.8; Sodium 141    Additional studies/ records that were reviewed today include:  2D echo 03/17/16 Study Conclusions - Left ventricle: The cavity size was mildly dilated. Systolic function was severely reduced. The estimated ejection fraction was in the range of 20% to 25%. Doppler parameters are consistent with restrictive physiology, indicative of decreased left ventricular diastolic compliance and/or increased left atrial pressure. Doppler parameters are consistent with elevated ventricular end-diastolic filling pressure. - Ventricular septum: Septal motion showed paradox. - Aortic valve: Trileaflet; mildly thickened, mildly calcified leaflets. There was mild regurgitation. - Mitral valve: Structurally normal valve. There was moderate regurgitation. - Left  atrium: The atrium was mildly dilated. - Right ventricle: Systolic function was normal. - Right atrium: The atrium was normal in size. - Tricuspid valve: There was moderate regurgitation. - Pulmonic valve: Structurally normal valve. There was no regurgitation. - Pulmonary arteries: Systolic pressure was mildly increased. PA peak pressure: 38 mm Hg (S). - Pericardium, extracardiac: There was no pericardial effusion. Impressions: - Severely reduced LVEF with diffuse hypokinesis, paradoxical septal motion and akinetic apical segments. There is no thrombus. Restrictive pattern of diastolic dysfunction.   ASSESSMENT & PLAN:   Chronic systolic CHF: EF 0000000. His weight is down to 163. His volume status is stable on Lasix 80 mg twice a day. Recent device interrogation shows that his impedance is at baseline. Continue Coreg and Lasix. No Ace/Arb 2/2 CKD  Atrial fibrillation: He sounds regular on exam today. His heart rate is well controlled. Device interrogation shows A. fib burden around 96%. Continue Coreg 6.25 mg twice a day. Continue Eliquis 2.5 mg twice a day for CHADSvasc of 5.   CKD: creatinine stable at 1.78 three weeks ago.   SSS s/p PPM: continue regular follow-up with electrophysiology  COPD: stable   Medication Adjustments/Labs and Tests Ordered: Current medicines are reviewed at length with the patient today.  Concerns regarding medicines are outlined above.  Medication changes, Labs and Tests ordered today are listed in the Patient Instructions below. Patient Instructions  Medication Instructions:  Your physician recommends that you continue on your current medications as directed. Please refer to the Current Medication list given to you today.   Labwork: None ordered  Testing/Procedures: None ordered  Follow-Up: Your physician wants you to follow-up in: 4 MONTHS WITH DR. Marlou Porch   You will receive a reminder letter in the mail two months in advance. If you  don't receive a letter, please call our office to schedule the follow-up appointment.   Any Other Special Instructions Will Be Listed Below (If Applicable).     If you need a refill on your cardiac medications before your next appointment, please call your pharmacy.      Signed,  Angelena Form, PA-C  04/27/2016 1:53 PM    [facility] [address], [place], [place]  [address] Phone: [phone]; Fax: [phone]

## 2016-04-26 NOTE — Progress Notes (Signed)
EPIC Encounter for ICM Monitoring  Patient Name: Levi Lewis is a 83 y.o. male Date: 04/26/2016 Primary Care Physican: Criselda Peaches, MD Primary Cardiologist:Skains Electrophysiologist: Faustino Congress Weight:unknown Bi-V Pacing:  71% (was >99% on 02/16/2016) AT/AF Burden: 96% (was <1% on 02/16/2016)       Spoke with son.  Heart Failure questions reviewed, pt asymptomatic   Thoracic impedance normal   Recommendations: No changes.  Reinforced to limit low salt food choices to 2000 mg day and limiting fluid intake to < 2 liters per day. Encouraged to call for fluid symptoms.    Follow-up plan: ICM clinic phone appointment on 05/27/2016 and patient has office appointment with Angelena Form, PA on 04/27/2016.  Copy of ICM check sent to primary cardiologist and device physician.   3 month ICM trend : 04/26/2016   1 Year ICM trend:     Rosalene Billings, RN 04/26/2016 5:01 PM

## 2016-04-27 ENCOUNTER — Ambulatory Visit (INDEPENDENT_AMBULATORY_CARE_PROVIDER_SITE_OTHER): Payer: Medicare Other | Admitting: Physician Assistant

## 2016-04-27 ENCOUNTER — Encounter: Payer: Self-pay | Admitting: Physician Assistant

## 2016-04-27 VITALS — BP 130/62 | HR 68 | Ht 70.0 in | Wt 174.0 lb

## 2016-04-27 DIAGNOSIS — I4819 Other persistent atrial fibrillation: Secondary | ICD-10-CM

## 2016-04-27 DIAGNOSIS — N189 Chronic kidney disease, unspecified: Secondary | ICD-10-CM | POA: Diagnosis not present

## 2016-04-27 DIAGNOSIS — I5022 Chronic systolic (congestive) heart failure: Secondary | ICD-10-CM | POA: Diagnosis not present

## 2016-04-27 DIAGNOSIS — I495 Sick sinus syndrome: Secondary | ICD-10-CM | POA: Diagnosis not present

## 2016-04-27 DIAGNOSIS — I481 Persistent atrial fibrillation: Secondary | ICD-10-CM | POA: Diagnosis not present

## 2016-04-27 NOTE — Patient Instructions (Signed)
Medication Instructions:  Your physician recommends that you continue on your current medications as directed. Please refer to the Current Medication list given to you today.   Labwork: None ordered  Testing/Procedures: None ordered  Follow-Up: Your physician wants you to follow-up in: 4 MONTHS WITH DR. SKAINS   You will receive a reminder letter in the mail two months in advance. If you don't receive a letter, please call our office to schedule the follow-up appointment.    Any Other Special Instructions Will Be Listed Below (If Applicable).     If you need a refill on your cardiac medications before your next appointment, please call your pharmacy.   

## 2016-05-08 ENCOUNTER — Telehealth: Payer: Self-pay | Admitting: Cardiology

## 2016-05-08 NOTE — Telephone Encounter (Signed)
Pt granddaughter called stating he got up this morning and felt weak. HR was in the 40s, bp 104/45. Stayed in bed and rested. Felt somewhat better this afternoon. HR in the 60s and bp 110/56. Did take his coreg this morning. Felt that his HR was irregular. Known hx of AF, but on [place] with Eliquis. Feeling better this afternoon. Instructed to follow his HR, and blood pressure. Agreed with this plan, and thanked for the follow up call.  Reino Bellis NP-C

## 2016-05-11 ENCOUNTER — Telehealth: Payer: Self-pay | Admitting: *Deleted

## 2016-05-11 NOTE — Telephone Encounter (Signed)
Pt's son called in to report pt's wt is up today to 178 lbs.  He reports his normal wt is around 172 lbs.  Son does not know pt's medications but does report he knows he is taking Furosemide 80 mg BID and recently took some medication for gout in his knee.  Son does not know which medication he took (could have been prednisone).  Per son - no increase in SOB and no edema no complaints.  He is asking for Margarita Grizzle to check his ICD tomorrow to see if he is holding onto any fluid.  Advised I will forward this information to her for f/u.  Advised to make sure pt is taking medications as listed and limiting NA intake.  Also to continue to weigh daily.  Son states understanding. Will also forward this information to Dr Marlou Porch for his knowledge and any recommendations.

## 2016-05-12 ENCOUNTER — Ambulatory Visit (INDEPENDENT_AMBULATORY_CARE_PROVIDER_SITE_OTHER): Payer: Medicare Other

## 2016-05-12 DIAGNOSIS — I5022 Chronic systolic (congestive) heart failure: Secondary | ICD-10-CM

## 2016-05-12 DIAGNOSIS — Z950 Presence of cardiac pacemaker: Secondary | ICD-10-CM

## 2016-05-12 MED ORDER — POTASSIUM CHLORIDE CRYS ER 20 MEQ PO TBCR: EXTENDED_RELEASE_TABLET | ORAL | 1 refills | Status: DC

## 2016-05-12 NOTE — Progress Notes (Signed)
Lets increase lasix to 120mg  BID x3 days and get a BMET. It looks like he is not on any K supplementation and his potassium has been normal historically. I feel like 120mg  BID is a high dose to not have any potassium. Lets start Kdur 44mEq BID x3 days. If he doesn't improve in the next couple days I think he should be in the office to be seen on Monday by an extender. Thanks!

## 2016-05-12 NOTE — Telephone Encounter (Signed)
Spoke with son and he will send ICM remote transmission today for review.  Patient has gained another pound today, is now at 179 lbs.  See ICM note for transmission review.

## 2016-05-12 NOTE — Progress Notes (Signed)
Call to son, Adir [facility].  Advised increase lasix to 120mg  BID x 3 days only and then return to prescribed dosage of 1 tablet 80 mg bid.  BMET scheduled for Monday, 05/16/2016.  Add prescription for Kdur Potassium 20 mEq 1 tablet bid x 3 days only and then stop. Advise call back if symptoms do not improve so patient can been seen in the office by extender.   He verbalized understanding and reported he wrote down directions.  He will start the changes this evening.

## 2016-05-12 NOTE — Progress Notes (Signed)
EPIC Encounter for ICM Monitoring  Patient Name: Levi Lewis is a 83 y.o. male Date: 05/12/2016 Primary Care Physican: Criselda Peaches, MD Primary Cardiologist:Skains Electrophysiologist: Caryl Comes Dry Weight:179 lbs Bi-V Pacing: 72% AT/AF Burden: 97%           Son called and left message 05/11/2016 that patient was gaining weight and returned call to son today.  Patient has gained 7 lbs in the past few days and has a little swelling in one foot.  His baseline weight is 172 lbs.      Thoracic impedance abnormal suggesting fluid accumulation since 05/06/16.  Confirmed patient is currently taking Furosemide 80 mg 1 tablet bid.    Labs: 03/31/2016 Creatinine 1.48, BUN 36, Potassium 3.8, Sodium 141 03/20/2016 Creatinine 1.90, BUN 37, Potassium 3.9, Sodium 141, EGFR 28-33  03/19/2016 Creatinine 2.03, BUN 37, Potassium 4.0, Sodium 141, EGFR 26-30  03/18/2016 Creatinine 1.96, BUN 35, Potassium 3.6, Sodium 142, EGFR 27-31  03/17/2016 Creatinine 1.76, BUN 29, Potassium 3.5, Sodium 142, EGFR 31-36 03/16/2016 Creatinine 1.79, BUN 29, Potassium 3.8, Sodium 142, EGFR 30-35  03/15/2016 Creatinine 1.87, BUN 4.1, Potassium 4.1, Sodium 137, EGFR 29-33  12/03/2015 Creatinine 1.70, BUN 36, Potassium 3.9, Sodium 144  10/28/2015 Creatinine 1.59, BUN 38, Potassium 4.7, Sodium 143  10/21/2015 Creatinine 1.73, BUN 28, Potassium 4.3, Sodium 139, EGFR 32-37    Recommendations:  Copy of ICM check sent to Marita Snellen, PA, Dr Marlou Porch and Dr Caryl Comes for review and recommendations.   Follow-up plan: ICM clinic phone appointment on 05/20/2016 to recheck fluid levels.    3 month ICM trend: 05/12/2016   1 Year ICM trend:      Rosalene Billings, RN 05/12/2016 9:34 AM

## 2016-05-16 ENCOUNTER — Other Ambulatory Visit: Payer: Medicare Other | Admitting: *Deleted

## 2016-05-16 DIAGNOSIS — I5022 Chronic systolic (congestive) heart failure: Secondary | ICD-10-CM

## 2016-05-17 ENCOUNTER — Telehealth: Payer: Self-pay | Admitting: Physician Assistant

## 2016-05-17 LAB — BASIC METABOLIC PANEL
BUN/Creatinine Ratio: 18 (ref 10–24)
BUN: 34 mg/dL (ref 10–36)
CHLORIDE: 103 mmol/L (ref 96–106)
CO2: 23 mmol/L (ref 18–29)
Calcium: 8.9 mg/dL (ref 8.6–10.2)
Creatinine, Ser: 1.92 mg/dL — ABNORMAL HIGH (ref 0.76–1.27)
GFR calc Af Amer: 33 mL/min/{1.73_m2} — ABNORMAL LOW (ref >59)
GFR calc non Af Amer: 29 mL/min/{1.73_m2} — ABNORMAL LOW (ref >59)
GLUCOSE: 96 mg/dL (ref 65–99)
POTASSIUM: 4.2 mmol/L (ref 3.5–5.2)
SODIUM: 142 mmol/L (ref 134–144)

## 2016-05-17 NOTE — Telephone Encounter (Signed)
Pt son, Ronalee Belts, [place] on file, has been made aware of pts lab results. Pt verbalized understanding.

## 2016-05-17 NOTE — Telephone Encounter (Signed)
Follow Up   Pt returning phone call from [place].

## 2016-05-20 ENCOUNTER — Ambulatory Visit (INDEPENDENT_AMBULATORY_CARE_PROVIDER_SITE_OTHER): Payer: Medicare Other

## 2016-05-20 DIAGNOSIS — I5022 Chronic systolic (congestive) heart failure: Secondary | ICD-10-CM

## 2016-05-20 DIAGNOSIS — Z950 Presence of cardiac pacemaker: Secondary | ICD-10-CM

## 2016-05-20 NOTE — Progress Notes (Signed)
EPIC Encounter for ICM Monitoring  Patient Name: Levi Lewis is a 83 y.o. male Date: 05/20/2016 Primary Care Physican: Criselda Peaches, MD Primary Cardiologist:Skains/Thompson, PA Electrophysiologist: Caryl Comes Dry Weight:173 lbs Bi-V Pacing: 72% AT/AF Burden: 97%       Spoke with son.  He reported patient is feeling much better, back to baseline weight of 173 lbs and no SOB.    Thoracic impedance returned to normal after 3 days of increased Lasix as advised last office visit.  Labs: 05/16/2016 Creatinine 1.92, BUN 34, Potassium 4.2, Sodium 142 03/31/2016 Creatinine 1.48, BUN 36, Potassium 3.8, Sodium 141 03/20/2016 Creatinine 1.90, BUN 37, Potassium 3.9, Sodium 141, EGFR 28-33  03/19/2016 Creatinine 2.03, BUN 37, Potassium 4.0, Sodium 141, EGFR 26-30  03/18/2016 Creatinine 1.96, BUN 35, Potassium 3.6, Sodium 142, EGFR 27-31  03/17/2016 Creatinine 1.76, BUN 29, Potassium 3.5, Sodium 142, EGFR 31-36 03/16/2016 Creatinine 1.79, BUN 29, Potassium 3.8, Sodium 142, EGFR 30-35  03/15/2016 Creatinine 1.87, BUN 4.1, Potassium 4.1, Sodium 137, EGFR 29-33  12/03/2015 Creatinine 1.70, BUN 36, Potassium 3.9, Sodium 144  10/28/2015 Creatinine 1.59, BUN 38, Potassium 4.7, Sodium 143  10/21/2015 Creatinine 1.73, BUN 28, Potassium 4.3, Sodium 139, EGFR 32-37   Recommendations: No changes. Reminded to limit dietary salt intake to 2000 mg/day and fluid intake to < 2 liters/day. Encouraged to call for fluid symptoms.  Follow-up plan: ICM clinic phone appointment on 06/21/2016.  Copy of ICM check sent to primary cardiologist and device physician.   3 month ICM trend: 05/20/2016   1 Year ICM trend:      Rosalene Billings, RN 05/20/2016 9:27 AM

## 2016-05-27 ENCOUNTER — Ambulatory Visit (INDEPENDENT_AMBULATORY_CARE_PROVIDER_SITE_OTHER): Payer: Medicare Other

## 2016-05-27 ENCOUNTER — Telehealth: Payer: Self-pay

## 2016-05-27 DIAGNOSIS — I5022 Chronic systolic (congestive) heart failure: Secondary | ICD-10-CM

## 2016-05-27 DIAGNOSIS — Z950 Presence of cardiac pacemaker: Secondary | ICD-10-CM

## 2016-05-27 NOTE — Progress Notes (Signed)
EPIC Encounter for ICM Monitoring  Patient Name: Levi Lewis is a 83 y.o. male Date: 05/27/2016 Primary Care Physican: Criselda Peaches, MD Primary Cardiologist:Skains/Thompson, PA Electrophysiologist: Faustino Congress Weight:unknown Bi-V Pacing: 72% AT/AF Burden: 97%       Attempted call to son. Left detailed message.  Transmission reviewed  Thoracic impedance close to normal.  Labs: 05/16/2016 Creatinine 1.92, BUN 34, Potassium 4.2, Sodium 142 12/14/2017Creatinine 1.48, BUN 36, Potassium 3.8, Sodium 141 03/20/2016 Creatinine 1.90, BUN 37, Potassium 3.9, Sodium 141, EGFR 28-33  03/19/2016 Creatinine 2.03, BUN 37, Potassium 4.0, Sodium 141, EGFR 26-30  03/18/2016 Creatinine 1.96, BUN 35, Potassium 3.6, Sodium 142, EGFR 27-31  03/17/2016 Creatinine 1.76, BUN 29, Potassium 3.5, Sodium 142, EGFR 31-36 03/16/2016 Creatinine 1.79, BUN 29, Potassium 3.8, Sodium 142, EGFR 30-35  03/15/2016 Creatinine 1.87, BUN 4.1, Potassium 4.1, Sodium 137, EGFR 29-33  12/03/2015 Creatinine 1.70, BUN 36, Potassium 3.9, Sodium 144  10/28/2015 Creatinine 1.59, BUN 38, Potassium 4.7, Sodium 143  10/21/2015 Creatinine 1.73, BUN 28, Potassium 4.3, Sodium 139, EGFR 32-37   Recommendations: Left voice mail with ICM number and encouraged to call for fluid symptoms.  Follow-up plan: ICM clinic phone appointment on 06/21/2016.  Copy of ICM check sent to device physician.   3 month ICM trend: 05/27/2016   1 Year ICM trend:      Rosalene Billings, RN 05/27/2016 7:32 AM

## 2016-05-27 NOTE — Telephone Encounter (Signed)
Remote ICM transmission received.  Attempted patient call and left detailed message regarding transmission and next ICM scheduled for 06/21/2016.  Advised to return call for any fluid symptoms or questions.

## 2016-05-28 ENCOUNTER — Telehealth: Payer: Self-pay | Admitting: Cardiology

## 2016-05-28 NOTE — Telephone Encounter (Signed)
Daughter Levi Lewis called to report Levi Lewis has gained 4lbs since Thursday and his abdomen and LE are swollen. On Thursday, he saw his PCP and was started on colchicine for gout. He has had some loose stools since. He is on daily prednisone (5mg  daily) and this was not changed in the setting of gout.   Usually takes 80mg  BID of lasix. In late January, he had a similar episode and was given 120mg  lasix BID x 3 days with Kdur 74mEq BID x3 days.  This improved the edema without need for admission and follow-up labs demonstrated normal potassium.   Spoke with daughter and we will repeat the same program of 120mg  lasix BID x 3 days with Kdur 43mEq BID x3 days.  She will call the office on Monday to give a status update.

## 2016-06-07 ENCOUNTER — Telehealth: Payer: Self-pay

## 2016-06-07 ENCOUNTER — Ambulatory Visit (INDEPENDENT_AMBULATORY_CARE_PROVIDER_SITE_OTHER): Payer: Medicare Other

## 2016-06-07 DIAGNOSIS — I5022 Chronic systolic (congestive) heart failure: Secondary | ICD-10-CM

## 2016-06-07 DIAGNOSIS — Z950 Presence of cardiac pacemaker: Secondary | ICD-10-CM

## 2016-06-07 NOTE — Progress Notes (Signed)
EPIC Encounter for ICM Monitoring  Patient Name: Levi Lewis is a 83 y.o. male Date: 06/07/2016 Primary Care Physican: Criselda Peaches, MD Primary Cardiologist:Skains/Thompson, PA Electrophysiologist: Caryl Comes Dry Weight:179 lbs Bi-V Pacing: 73% AT/AF Burden: 97%          Received call from son.  He reported patient has gained 4 lbs in last 4 days. No swelling and breathing remains unchanged.  PCP increased Furosemide and Potassium 3 days from 2/10 to 2/13 because of 4 lb weight gain according to son and PCP phone message.    Thoracic impedance normal since 2/14 which was after PCP increased Furosemide for 3 days on 2/10.  Currently taking Furosemide 80 mg 1 tablet bid and Potassium 20 mEq is taken prn.    Labs: 05/16/2016 Creatinine 1.92, BUN 34, Potassium 4.2, Sodium 142 03/31/2016 Creatinine 1.48, BUN 36, Potassium 3.8, Sodium 141 03/20/2016 Creatinine 1.90, BUN 37, Potassium 3.9, Sodium 141, EGFR 28-33  03/19/2016 Creatinine 2.03, BUN 37, Potassium 4.0, Sodium 141, EGFR 26-30  03/18/2016 Creatinine 1.96, BUN 35, Potassium 3.6, Sodium 142, EGFR 27-31  03/17/2016 Creatinine 1.76, BUN 29, Potassium 3.5, Sodium 142, EGFR 31-36 03/16/2016 Creatinine 1.79, BUN 29, Potassium 3.8, Sodium 142, EGFR 30-35  03/15/2016 Creatinine 1.87, BUN 4.1, Potassium 4.1, Sodium 137, EGFR 29-33  12/03/2015 Creatinine 1.70, BUN 36, Potassium 3.9, Sodium 144  10/28/2015 Creatinine 1.59, BUN 38, Potassium 4.7, Sodium 143  10/21/2015 Creatinine 1.73, BUN 28, Potassium 4.3, Sodium 139, EGFR 32-37   Recommendations: Copy of ICM check sent to Dr Marlou Porch and Dr Caryl Comes for review and recommendations.    Follow-up plan: ICM clinic phone appointment on 06/21/2016 since ICM is now normal but will follow up with son with phone call on 06/09/2016    3 month ICM trend: 06/07/2016     1 Year ICM trend:      Rosalene Billings, RN 06/07/2016 12:20 PM

## 2016-06-07 NOTE — Progress Notes (Signed)
Call to son and advised Dr Marlou Porch did not want to make any med changes at this time.  Advised I will be out of the office tomorrow but if patient worsens he can contact the office for follow up with Nell Range PA since she has seen him at the last 2 office visits or use ER if needed.  Advised I will make a follow up phone call to him on Thursday to check on patient.

## 2016-06-07 NOTE — Progress Notes (Signed)
From: Jerline Pain, MD  Sent: 06/07/2016  4:32 PM  To: Shellia Cleverly, RN   No need to change any dose. Creatinine has been fluctuating between 1.5 and 2.0. His overall impedance seems to be stabilized. Last clinic visit reviewed, symptomatically he was improved.   Candee Furbish, MD   ----- Message -----  From: Rosalene Billings, RN  Sent: 06/07/2016 12:37 PM  To: Jerline Pain, MD

## 2016-06-07 NOTE — Telephone Encounter (Signed)
Received call from son.  He stated patient is gaining 1 lb a day in the last 4-5 days.  Weight today is 179.  Requested to send remote transmission to check fluid levels.  See ICM note for results.

## 2016-06-08 ENCOUNTER — Telehealth: Payer: Self-pay | Admitting: Physician Assistant

## 2016-06-08 ENCOUNTER — Other Ambulatory Visit: Payer: Self-pay | Admitting: Physician Assistant

## 2016-06-08 DIAGNOSIS — I509 Heart failure, unspecified: Secondary | ICD-10-CM

## 2016-06-08 NOTE — Progress Notes (Signed)
    Discussed weight gain with son. Will increase lasix to 120mg  BID and Kdur 40MEq BID. Will get a BMET Friday. Order placed.  Angelena Form PA-C  [facility]

## 2016-06-08 NOTE — Telephone Encounter (Signed)
     Discussed weight gain with son. Impedance has remained normal. Will increase lasix to 120mg  BID and KDur 40mEq BID x 3 days and get BMET on Friday. He is feeling okay but weight continues to go up.   Angelena Form PA-C  [facility]

## 2016-06-09 ENCOUNTER — Telehealth: Payer: Self-pay

## 2016-06-09 NOTE — Telephone Encounter (Signed)
ICM call to son to check on how patient is feeling and if he has lost weight after having diuretic increased for weight gain.  Left message for return call.

## 2016-06-10 ENCOUNTER — Other Ambulatory Visit: Payer: Medicare Other | Admitting: *Deleted

## 2016-06-10 ENCOUNTER — Telehealth: Payer: Self-pay | Admitting: Physician Assistant

## 2016-06-10 DIAGNOSIS — I509 Heart failure, unspecified: Secondary | ICD-10-CM

## 2016-06-10 NOTE — Telephone Encounter (Signed)
NEw Message  Mr. Levi Lewis call requesting to speak with RN. [name] states pt medications were changed and is now experiencing some weight gain. [name] would like for pt to be seen today. Please call back to discuss

## 2016-06-10 NOTE — Telephone Encounter (Signed)
I called and spoke with the patient's son, Legrand Como [facility]).  Per Legrand Como, the patient's lasix was increased on 2/21 from 80 mg BID to 120 mg BID x 3 days (potassium increased to 40 mg BID x 3 days). The patient is followed in [facility] clinic- fluid trends elevated mid January, but back to baseline this week. The patient's son reports he is still having fluid increase- up 1 lb from Wednesday to Thursday and up 2.2 lbs from yesterday to today. The patient is feeling ok per his son's report- he doesn't think he is out of rhythm today per the patient.  He is having some abdominal swelling and mild lower extremity swelling which is not new for him.  In reviewing his chart, the patient's BiV pacing percentage has gone from > 99% 02/16/16 to 73% now and his AT/AF burden has gone from <1% 02/16/16 to 97% now.  He is no longer on amiodarone due to possible pulmonary toxicity. Per the patient's son, the patient's baseline weight is 172-175 lbs. He is 179.8 lbs today.   He is due for a BMP today. Reviewed with Dr. Rayann Heman- no changes ordered- the patient will need to come for his BMP today and then follow up with Dr. Marlou Porch Dr. Caryl Comes.  I have notified the patient's son of Dr. Jackalyn Lombard recommendations. He will come for labs this afternoon. I have scheduled him for follow up on 06/28/16 with Dr. Caryl Comes.  He is scheduled for a remote check on 3/6 (full download).  I advised he will not need this as we will check him on 3/13 at his office visit.  I also advised him that I will forward a copy of my note to Margarita Grizzle and if she feels like she needs to check him prior to 06/28/16.  The patient's son voices understanding.

## 2016-06-10 NOTE — Telephone Encounter (Signed)
Spoke with son and he reported he has spoken with Kern Alberta, RN Dr Olin Pia nurse and following up on  behalf of [place], [place]..  Dr Caryl Comes appt scheduled on 06/28/2016.  Advised I would recheck fluid levels on 06/14/2016 and he stated that would be fine.  Patient continues to gain weight despite extra fluid medicaiton. No changes today.  Patient due for BMP today.

## 2016-06-11 LAB — BASIC METABOLIC PANEL
BUN / CREAT RATIO: 21 (ref 10–24)
BUN: 41 mg/dL — ABNORMAL HIGH (ref 10–36)
CHLORIDE: 104 mmol/L (ref 96–106)
CO2: 19 mmol/L (ref 18–29)
Calcium: 8.7 mg/dL (ref 8.6–10.2)
Creatinine, Ser: 1.95 mg/dL — ABNORMAL HIGH (ref 0.76–1.27)
GFR calc non Af Amer: 28 mL/min/{1.73_m2} — ABNORMAL LOW (ref >59)
GFR, EST AFRICAN AMERICAN: 32 mL/min/{1.73_m2} — AB (ref >59)
Glucose: 111 mg/dL — ABNORMAL HIGH (ref 65–99)
POTASSIUM: 4.6 mmol/L (ref 3.5–5.2)
Sodium: 142 mmol/L (ref 134–144)

## 2016-06-13 ENCOUNTER — Telehealth: Payer: Self-pay | Admitting: *Deleted

## 2016-06-14 ENCOUNTER — Ambulatory Visit (INDEPENDENT_AMBULATORY_CARE_PROVIDER_SITE_OTHER): Payer: Medicare Other

## 2016-06-14 DIAGNOSIS — Z950 Presence of cardiac pacemaker: Secondary | ICD-10-CM | POA: Diagnosis not present

## 2016-06-14 DIAGNOSIS — I5022 Chronic systolic (congestive) heart failure: Secondary | ICD-10-CM | POA: Diagnosis not present

## 2016-06-14 NOTE — Progress Notes (Signed)
EPIC Encounter for ICM Monitoring  Patient Name: Levi Lewis is a 83 y.o. male Date: 06/14/2016 Primary Care Physican: Criselda Peaches, MD Primary Cardiologist:Skains/Thompson, PA Electrophysiologist: Caryl Comes Dry Weight:179 lbs Bi-V Pacing: 73% AT/AF Burden: 97%        Spoke with son.  Heart Failure questions reviewed.  Weight has fluctuated up and down by 1-2 pounds since last week but seems stable at this time.  At [facility] check on 2/2, patient weighed 173 lbs which is his baseline per son.    Thoracic impedance normal.  Currently taking Furosemide 80 mg 1 tablet bid and Potassium 20 mEq is taken prn.    Labs: 06/14/2016 Creatinine 1.95, BUN 41, Potassium 4.6, Sodium 142, EGFR 28-32 05/16/2016 Creatinine 1.92, BUN 34, Potassium 4.2, Sodium 142 12/14/2017Creatinine 1.48, BUN 36, Potassium 3.8, Sodium 141 03/20/2016 Creatinine 1.90, BUN 37, Potassium 3.9, Sodium 141, EGFR 28-33  03/19/2016 Creatinine 2.03, BUN 37, Potassium 4.0, Sodium 141, EGFR 26-30  03/18/2016 Creatinine 1.96, BUN 35, Potassium 3.6, Sodium 142, EGFR 27-31  03/17/2016 Creatinine 1.76, BUN 29, Potassium 3.5, Sodium 142, EGFR 31-36 03/16/2016 Creatinine 1.79, BUN 29, Potassium 3.8, Sodium 142, EGFR 30-35  03/15/2016 Creatinine 1.87, BUN 4.1, Potassium 4.1, Sodium 137, EGFR 29-33  12/03/2015 Creatinine 1.70, BUN 36, Potassium 3.9, Sodium 144  10/28/2015 Creatinine 1.59, BUN 38, Potassium 4.7, Sodium 143  10/21/2015 Creatinine 1.73, BUN 28, Potassium 4.3, Sodium 139, EGFR 32-37   Recommendations:  No changes. Reminded to limit dietary salt intake to 2000 mg/day and fluid intake to < 2 liters/day. Encouraged to call for fluid symptoms.  Follow-up plan: ICM clinic phone appointment on 08/01/2016 and has defib office check with Dr Caryl Comes on 06/28/2016.  Copy of ICM check sent to PA, primary cardiologist and device physician.   3 month ICM trend: 06/13/2016      1 Year ICM trend:      Rosalene Billings,  RN 06/14/2016 9:32 AM

## 2016-06-20 ENCOUNTER — Encounter: Payer: Self-pay | Admitting: Cardiology

## 2016-06-20 NOTE — Telephone Encounter (Signed)
Stop his furosemide 80 mg twice a day and start torsemide 40 mg twice daily. Continue to restrict fluids to 1-1-1/2 L daily, no salt.  Let's see if this helps take some weight down with diuresis.  Candee Furbish, MD

## 2016-06-21 ENCOUNTER — Telehealth: Payer: Self-pay | Admitting: Nurse Practitioner

## 2016-06-21 DIAGNOSIS — I5022 Chronic systolic (congestive) heart failure: Secondary | ICD-10-CM

## 2016-06-21 MED ORDER — TORSEMIDE 20 MG PO TABS: 40.0000 mg | ORAL_TABLET | Freq: Two times a day (BID) | ORAL | 3 refills | Status: DC

## 2016-06-21 NOTE — Telephone Encounter (Signed)
Spoke with patient's son, Legrand Como, and reviewed Dr. Marlou Porch' advice with him. He verbalized understanding and agreement and repeated instructions back to me. I advised him patient should continue potassium 20 meq daily. He states he thought the patient only took Kdur on days that he doubled his lasix. I advised that he should take one daily and that we will get a bmet on 3/13 when he comes in to see Dr. Caryl Comes. He verbalized understanding and agreement and thanked me for the call.

## 2016-06-21 NOTE — Telephone Encounter (Signed)
Called patient to discuss medication changes and plan per Dr. Marlou Porch:  Stop his furosemide 80 mg twice a day and start torsemide 40 mg twice daily. Continue to restrict fluids to 1-1-1/2 L daily, no salt.  Let's see if this helps take some weight down with diuresis.  Candee Furbish, MD  Patient states he took furosemide 40 mg this morning. He states weight is 182.4 lb today. I advised him regarding total fluid consumption for the day and translated this to ounces for him. He states he is following a low salt diet. He states his son will get his Rx from the pharmacy for the new medication. I advised him to stop furosemide once he starts torsemide. I advised him to monitor weight and call back in a few days to report.   I left a message for patient's son, Legrand Como, to call the office so that I can give him the same information.

## 2016-06-26 ENCOUNTER — Other Ambulatory Visit: Payer: Self-pay | Admitting: Internal Medicine

## 2016-06-27 NOTE — Telephone Encounter (Signed)
Received refill request for Eliquis 2.5mg ; Pt last seen by Dr. Marlou Porch on 06/10/16, 83yrs old, wt-78.9kg, Crea-1.95 on 06/10/16. Will send in refill as requested.

## 2016-06-28 ENCOUNTER — Other Ambulatory Visit (INDEPENDENT_AMBULATORY_CARE_PROVIDER_SITE_OTHER): Payer: Medicare Other

## 2016-06-28 ENCOUNTER — Encounter: Payer: Self-pay | Admitting: Internal Medicine

## 2016-06-28 ENCOUNTER — Ambulatory Visit (INDEPENDENT_AMBULATORY_CARE_PROVIDER_SITE_OTHER): Payer: Medicare Other | Admitting: Internal Medicine

## 2016-06-28 VITALS — BP 110/60 | HR 72 | Ht 70.0 in | Wt 177.6 lb

## 2016-06-28 DIAGNOSIS — I481 Persistent atrial fibrillation: Secondary | ICD-10-CM | POA: Diagnosis not present

## 2016-06-28 DIAGNOSIS — I2589 Other forms of chronic ischemic heart disease: Secondary | ICD-10-CM

## 2016-06-28 DIAGNOSIS — I5022 Chronic systolic (congestive) heart failure: Secondary | ICD-10-CM

## 2016-06-28 DIAGNOSIS — I4819 Other persistent atrial fibrillation: Secondary | ICD-10-CM

## 2016-06-28 DIAGNOSIS — Z950 Presence of cardiac pacemaker: Secondary | ICD-10-CM

## 2016-06-28 DIAGNOSIS — I4892 Unspecified atrial flutter: Secondary | ICD-10-CM | POA: Diagnosis not present

## 2016-06-28 DIAGNOSIS — I255 Ischemic cardiomyopathy: Secondary | ICD-10-CM

## 2016-06-28 LAB — CUP PACEART INCLINIC DEVICE CHECK
Brady Statistic RA Percent Paced: 18 %
Implantable Lead Implant Date: 20150330
Implantable Lead Location: 753860
Lead Channel Impedance Value: 412.5 Ohm
Lead Channel Impedance Value: 425 Ohm
Lead Channel Pacing Threshold Amplitude: 0.75 V
Lead Channel Pacing Threshold Amplitude: 0.75 V
Lead Channel Pacing Threshold Pulse Width: 0.4 ms
Lead Channel Pacing Threshold Pulse Width: 0.4 ms
Lead Channel Sensing Intrinsic Amplitude: 0.2 mV
Lead Channel Sensing Intrinsic Amplitude: 9 mV
Lead Channel Setting Pacing Amplitude: 1.5 V
Lead Channel Setting Pacing Amplitude: 2.5 V
Lead Channel Setting Pacing Pulse Width: 0.4 ms
Lead Channel Setting Sensing Sensitivity: 2 mV
MDC IDC LEAD IMPLANT DT: 20150330
MDC IDC LEAD IMPLANT DT: 20150330
MDC IDC LEAD LOCATION: 753858
MDC IDC LEAD LOCATION: 753859
MDC IDC MSMT BATTERY VOLTAGE: 2.96 V
MDC IDC MSMT LEADCHNL LV PACING THRESHOLD AMPLITUDE: 0.75 V
MDC IDC MSMT LEADCHNL LV PACING THRESHOLD PULSEWIDTH: 0.4 ms
MDC IDC MSMT LEADCHNL RA IMPEDANCE VALUE: 362.5 Ohm
MDC IDC MSMT LEADCHNL RV PACING THRESHOLD AMPLITUDE: 0.75 V
MDC IDC MSMT LEADCHNL RV PACING THRESHOLD PULSEWIDTH: 0.4 ms
MDC IDC PG IMPLANT DT: 20150330
MDC IDC PG SERIAL: 2978403
MDC IDC SESS DTM: 20180313170924
MDC IDC SET LEADCHNL LV PACING PULSEWIDTH: 0.4 ms
MDC IDC SET LEADCHNL RV PACING AMPLITUDE: 2 V
MDC IDC STAT BRADY RV PERCENT PACED: 74 %

## 2016-06-28 NOTE — Progress Notes (Signed)
Patient Care Team: [name], MD as PCP - General (Internal Medicine)   HPI  Levi Lewis is a 83 y.o. male Seen in followup for CRT P. implantation 3/15.this occurred in the context of paroxysmal atrial fibrillation and alternating bundle branch block.  EF 25-30% 3/15  He takes apixoban  Amiodarone stopped with persistent Afib    has exertional shortness of breath which is progressively limited ;  he does not have any peripheral edema  Having dizziness occasionally during the day withstanding.   Past Medical History:  Diagnosis Date  . Aortic insufficiency   . Arthritis    "back, knees, right leg" (03/15/2016)  . Atrial fibrillation ([place])   . CAD (coronary artery disease)   . Cardiomyopathy, EF by echo 25-30% 07/14/2013  . CHF (congestive heart failure) ([place])   . Chronic lower back pain   . COPD (chronic obstructive pulmonary disease) ([place])   . Dyslipidemia   . GERD (gastroesophageal reflux disease)   . Gout   . Hypertension   . Hypothyroidism   . Left bundle branch block   . Lung nodules   . Mitral regurgitation   . On home oxygen therapy    "2L; 24/7 for the last week" (03/15/2016)  . Pacemaker   . Pneumonia    "2-3 times" (03/15/2016)  . RBBB   . Shortness of breath   . Shoulder pain    left; limited ROM  . Skin cancer    "left side of my nose; had another one maybe off my back" (03/15/2016)    Past Surgical History:  Procedure Laterality Date  . BACK SURGERY    . BI-VENTRICULAR PACEMAKER INSERTION (CRT-P)  07-15-2013   STJ Quadra Allura CRTP implanted by Dr Caryl Comes for NICM, CHF, alternating bundle branch blocks  . CARDIOVERSION N/A 09/13/2013   Procedure: CARDIOVERSION;  Surgeon: Carlena Bjornstad, MD;  Location: [facility] ENDOSCOPY;  Service: Cardiovascular;  Laterality: N/A;  . CARDIOVERSION N/A 10/17/2013   Procedure: CARDIOVERSION;  Surgeon: Lelon Perla, MD;  Location: [facility] ENDOSCOPY;  Service: Cardiovascular;  Laterality: N/A;  . CATARACT EXTRACTION  W/ INTRAOCULAR LENS  IMPLANT, BILATERAL Bilateral   . CORONARY ANGIOPLASTY    . INSERT / REPLACE / REMOVE PACEMAKER    . KNEE ARTHROSCOPY Right   . LUMBAR DISC SURGERY    . PERMANENT PACEMAKER INSERTION N/A 07/15/2013   Procedure: PERMANENT PACEMAKER INSERTION;  Surgeon: Deboraha Sprang, MD;  Location: [facility] CATH LAB;  Service: Cardiovascular;  Laterality: N/A;  . SHOULDER ARTHROSCOPY Right   . THROAT SURGERY  ~ 1950   "exploratory; felt like I had something in my throat & they went in to look around"  . TONSILLECTOMY      Current Outpatient Prescriptions  Medication Sig Dispense Refill  . carvedilol (COREG) 6.25 MG tablet Take 1 tablet (6.25 mg total) by mouth 2 (two) times daily with a meal. 60 tablet 5  . ELIQUIS 2.5 MG TABS tablet TAKE 1 TABLET BY MOUTH TWICE A DAY 180 tablet 1  . fenofibrate (TRICOR) 48 MG tablet Take 0.5 tablets (24 mg total) by mouth daily. 45 tablet 1  . fish oil-omega-3 fatty acids 1000 MG capsule Take 2 g by mouth daily.     Marland Kitchen gabapentin (NEURONTIN) 300 MG capsule Take 300 mg by mouth 2 (two) times daily.  12  . guaiFENesin (MUCINEX) 600 MG 12 hr tablet Take 600 mg by mouth 2 (two) times daily as needed for cough  or to loosen phlegm (COLDS).     Marland Kitchen levothyroxine (SYNTHROID, LEVOTHROID) 75 MCG tablet Take 1 tablet (75 mcg total) by mouth daily. 30 tablet 6  . Multiple Vitamins-Minerals (OCUVITE PO) Take 2 tablets by mouth daily.     Marland Kitchen omeprazole (PRILOSEC) 20 MG capsule Take 20 mg by mouth daily.     . potassium chloride (KLOR-CON) 20 MEQ packet Take 20 mEq by mouth daily.    . predniSONE (DELTASONE) 10 MG tablet Take 5 mg by mouth daily.  12  . tamsulosin (FLOMAX) 0.4 MG CAPS capsule Take 0.4 mg by mouth 2 (two) times daily.     Marland Kitchen torsemide (DEMADEX) 20 MG tablet Take 2 tablets (40 mg total) by mouth 2 (two) times daily. 360 tablet 3  . triazolam (HALCION) 0.25 MG tablet Take 0.5 tablets (0.125 mg total) by mouth at bedtime as needed for sleep. 15 tablet 0   No  current facility-administered medications for this visit.     No Known Allergies  Review of Systems negative except from HPI and PMH  Physical Exam BP 110/60   Pulse 72   Ht 5\' 10"  (1.778 m)   Wt 177 lb 9.6 oz (80.6 kg)   SpO2 92%   BMI 25.48 kg/m  Well developed and well nourished in no acute distress HENT normal E scleral and icterus clear Neck Supple JVP flat; carotids brisk and full Clear to ausculation  Regular rate and rhythm, no murmurs gallops or rub Soft with active bowel sounds No clubbing cyanosis  Edema Alert and oriented, grossly normal motor and sensory function Skin Warm and Dry  ECG demonstrates Vpacing   Assessment and  Plan  Atrial fib/flutter-persistent  High Risk Medication   Amiodarone  Atrial lead failure  HFrEF  Alternating bundle branch block  Dyspnea  CRT-P  St Jude   The patient has worsening heart failure in the setting of persistent atrial fibrillation loss of biventricular pacing. 3 alternatives present themselves. The first is to manage him dramatically adjust his diuretics. He is now enrolled ICM clinic making some headway in this regard. Another is cardioversion. This is a low risk procedure. He had significant duration of atrial fibrillation free interval following his last cardioversion. I think this is the most reasonable I discussed this with Dr. MS who is in agreement. The third would be AV junction ablation.  The latter 2 are certainly raising of eyebrows and this 82 year old man but is otherwise quite vigorous sound of mind and body.  We will arrange followup with DR MS to follow   We will be available for further discussion re AV ablation which I think too is relatively high yield low risk   For now will continue his current diuretics  He has not missed diuretics

## 2016-06-28 NOTE — Patient Instructions (Addendum)
Medication Instructions: - Your physician recommends that you continue on your current medications as directed. Please refer to the Current Medication list given to you today.  Labwork: - none ordered  Procedures/Testing: - Your physician has recommended that you have a Cardioversion (DCCV). Electrical Cardioversion uses a jolt of electricity to your heart either through paddles or wired patches attached to your chest. This is a controlled, usually prescheduled, procedure. Defibrillation is done under light anesthesia in the hospital, and you usually go home the day of the procedure. This is done to get your heart back into a normal rhythm. You are not awake for the procedure  ** Dr. Olin Pia nurse will call you on Thursday to confirm the date/ time for your procedure**  Follow-Up: - Your physician recommends that you schedule a follow-up appointment in: 4-5 weeks with Dr. Marlou Porch   Any Additional Special Instructions Will Be Listed Below (If Applicable).     If you need a refill on your cardiac medications before your next appointment, please call your pharmacy.

## 2016-06-29 LAB — BASIC METABOLIC PANEL
BUN / CREAT RATIO: 21 (ref 10–24)
BUN: 40 mg/dL — ABNORMAL HIGH (ref 10–36)
CHLORIDE: 102 mmol/L (ref 96–106)
CO2: 24 mmol/L (ref 18–29)
CREATININE: 1.87 mg/dL — AB (ref 0.76–1.27)
Calcium: 9 mg/dL (ref 8.6–10.2)
GFR calc Af Amer: 34 mL/min/{1.73_m2} — ABNORMAL LOW (ref >59)
GFR calc non Af Amer: 29 mL/min/{1.73_m2} — ABNORMAL LOW (ref >59)
GLUCOSE: 111 mg/dL — AB (ref 65–99)
Potassium: 4.7 mmol/L (ref 3.5–5.2)
SODIUM: 144 mmol/L (ref 134–144)

## 2016-06-29 NOTE — Addendum Note (Signed)
Addended by: Campbell Riches on: 06/29/2016 10:36 AM   Modules accepted: Orders

## 2016-06-30 ENCOUNTER — Encounter: Payer: Self-pay | Admitting: *Deleted

## 2016-06-30 ENCOUNTER — Telehealth: Payer: Self-pay | Admitting: *Deleted

## 2016-06-30 NOTE — Telephone Encounter (Signed)
-----   Message from Michaelyn Barter, RN sent at 06/29/2016  9:01 AM EDT ----- Levi Lewis,  Patient scheduled for cardioversion at 10:00 on March 19th with Dr. Sallyanne Kuster. Patient still needs to be called and given instructions. Case #[id number]  Thanks, Jeannene Patella ----- Message ----- From: Emily Filbert, RN Sent: 06/28/2016   5:45 PM To: Emily Filbert, RN, [facility] Triage  Hey girls!  Dr. Caryl Comes came out of this patient's room at [facility] and he needs a cardioversion... Can someone please call scheduling tomorrow and just see if there is availability 3/19 or 3/22 for Dr. Sallyanne Kuster to do this. If you can just schedule I told the patient I would call him Thursday. Dx- a-fib  Thank you!!!!! Nira Conn

## 2016-06-30 NOTE — Telephone Encounter (Signed)
I called the patient's son Ronalee Belts, and confirmed the patient's DCCV for Monday 07/04/16 with Dr. Sallyanne Kuster at 10:00 am (arrive at 8:30 am).  Letter of instructions reviewed with Ronalee Belts and sent to [place].

## 2016-07-04 ENCOUNTER — Ambulatory Visit (HOSPITAL_COMMUNITY): Payer: Medicare Other | Admitting: Certified Registered Nurse Anesthetist

## 2016-07-04 ENCOUNTER — Encounter (HOSPITAL_COMMUNITY): Payer: Self-pay | Admitting: *Deleted

## 2016-07-04 ENCOUNTER — Ambulatory Visit (HOSPITAL_COMMUNITY)
Admission: RE | Admit: 2016-07-04 | Discharge: 2016-07-04 | Disposition: A | Discharge status: Home / Self Care | Payer: Medicare Other | Source: Ambulatory Visit | Attending: Cardiovascular Disease | Admitting: Cardiovascular Disease

## 2016-07-04 ENCOUNTER — Encounter (HOSPITAL_COMMUNITY)
Admission: RE | Disposition: A | Discharge status: Home / Self Care | Payer: Self-pay | Source: Ambulatory Visit | Attending: Cardiovascular Disease

## 2016-07-04 DIAGNOSIS — Z9981 Dependence on supplemental oxygen: Secondary | ICD-10-CM | POA: Insufficient documentation

## 2016-07-04 DIAGNOSIS — I481 Persistent atrial fibrillation: Secondary | ICD-10-CM | POA: Diagnosis not present

## 2016-07-04 DIAGNOSIS — I452 Bifascicular block: Secondary | ICD-10-CM | POA: Diagnosis not present

## 2016-07-04 DIAGNOSIS — I480 Paroxysmal atrial fibrillation: Secondary | ICD-10-CM | POA: Diagnosis present

## 2016-07-04 DIAGNOSIS — Z950 Presence of cardiac pacemaker: Secondary | ICD-10-CM | POA: Insufficient documentation

## 2016-07-04 DIAGNOSIS — I110 Hypertensive heart disease with heart failure: Secondary | ICD-10-CM | POA: Insufficient documentation

## 2016-07-04 DIAGNOSIS — K219 Gastro-esophageal reflux disease without esophagitis: Secondary | ICD-10-CM | POA: Diagnosis not present

## 2016-07-04 DIAGNOSIS — E039 Hypothyroidism, unspecified: Secondary | ICD-10-CM | POA: Diagnosis not present

## 2016-07-04 DIAGNOSIS — E785 Hyperlipidemia, unspecified: Secondary | ICD-10-CM | POA: Insufficient documentation

## 2016-07-04 DIAGNOSIS — I2510 Atherosclerotic heart disease of native coronary artery without angina pectoris: Secondary | ICD-10-CM | POA: Diagnosis not present

## 2016-07-04 DIAGNOSIS — I4819 Other persistent atrial fibrillation: Secondary | ICD-10-CM

## 2016-07-04 DIAGNOSIS — I5020 Unspecified systolic (congestive) heart failure: Secondary | ICD-10-CM | POA: Insufficient documentation

## 2016-07-04 DIAGNOSIS — Z79899 Other long term (current) drug therapy: Secondary | ICD-10-CM | POA: Insufficient documentation

## 2016-07-04 DIAGNOSIS — Z7901 Long term (current) use of anticoagulants: Secondary | ICD-10-CM | POA: Diagnosis not present

## 2016-07-04 DIAGNOSIS — Z7952 Long term (current) use of systemic steroids: Secondary | ICD-10-CM | POA: Insufficient documentation

## 2016-07-04 DIAGNOSIS — I340 Nonrheumatic mitral (valve) insufficiency: Secondary | ICD-10-CM | POA: Diagnosis not present

## 2016-07-04 DIAGNOSIS — J449 Chronic obstructive pulmonary disease, unspecified: Secondary | ICD-10-CM | POA: Diagnosis not present

## 2016-07-04 HISTORY — PX: CARDIOVERSION: SHX1299

## 2016-07-04 SURGERY — CARDIOVERSION
Anesthesia: General

## 2016-07-04 MED ORDER — SODIUM CHLORIDE 0.9 % IV SOLN
INTRAVENOUS | Status: DC
  Administered 2016-07-04: 09:00:00 via INTRAVENOUS

## 2016-07-04 MED ORDER — PROPOFOL 10 MG/ML IV BOLUS
INTRAVENOUS | Status: DC | PRN
  Administered 2016-07-04: 50 mg via INTRAVENOUS

## 2016-07-04 MED ORDER — LIDOCAINE HCL (CARDIAC) 20 MG/ML IV SOLN
INTRAVENOUS | Status: DC | PRN
  Administered 2016-07-04: 60 mg via INTRAVENOUS

## 2016-07-04 NOTE — Op Note (Signed)
Procedure: Electrical Cardioversion Indications:  Atrial Fibrillation  Procedure Details:  Consent: Risks of procedure as well as the alternatives and risks of each were explained to the (patient/caregiver).  Consent for procedure obtained.  Time Out: Verified patient identification, verified procedure, site/side was marked, verified correct patient position, special equipment/implants available, medications/allergies/relevent history reviewed, required imaging and test results available.  Performed  Patient placed on cardiac monitor, pulse oximetry, supplemental oxygen as necessary.  Sedation given: propofol 50 mg IV, Anesthesiology, Dr. Marcie Bal. Pacer pads placed anterior and posterior chest.  Cardioverted  2 time(s).  Cardioversion with synchronized biphasic 120J shock unsuccessful, second synchronized shock at 200 J successful.  Evaluation: Findings: Post procedure EKG shows: AV sequential pacing, intermittent organized atrial tachycardia at 202 bpm. Complications: None Patient did tolerate procedure well. Pacemaker check pre- and post procedure shows normal function.  Time Spent Directly with the Patient:  30 minutes   Levi Lewis 07/04/2016, 9:15 AM

## 2016-07-04 NOTE — H&P (View-Only) (Signed)
Patient Care Team: [name], MD as PCP - General (Internal Medicine)   HPI  Levi Lewis is a 83 y.o. male Seen in followup for CRT P. implantation 3/15.this occurred in the context of paroxysmal atrial fibrillation and alternating bundle branch block.  EF 25-30% 3/15  He takes apixoban  Amiodarone stopped with persistent Afib    has exertional shortness of breath which is progressively limited ;  he does not have any peripheral edema  Having dizziness occasionally during the day withstanding.   Past Medical History:  Diagnosis Date  . Aortic insufficiency   . Arthritis    "back, knees, right leg" (03/15/2016)  . Atrial fibrillation ([place])   . CAD (coronary artery disease)   . Cardiomyopathy, EF by echo 25-30% 07/14/2013  . CHF (congestive heart failure) ([place])   . Chronic lower back pain   . COPD (chronic obstructive pulmonary disease) ([place])   . Dyslipidemia   . GERD (gastroesophageal reflux disease)   . Gout   . Hypertension   . Hypothyroidism   . Left bundle branch block   . Lung nodules   . Mitral regurgitation   . On home oxygen therapy    "2L; 24/7 for the last week" (03/15/2016)  . Pacemaker   . Pneumonia    "2-3 times" (03/15/2016)  . RBBB   . Shortness of breath   . Shoulder pain    left; limited ROM  . Skin cancer    "left side of my nose; had another one maybe off my back" (03/15/2016)    Past Surgical History:  Procedure Laterality Date  . BACK SURGERY    . BI-VENTRICULAR PACEMAKER INSERTION (CRT-P)  07-15-2013   STJ Quadra Allura CRTP implanted by Dr Caryl Comes for NICM, CHF, alternating bundle branch blocks  . CARDIOVERSION N/A 09/13/2013   Procedure: CARDIOVERSION;  Surgeon: Carlena Bjornstad, MD;  Location: [facility] ENDOSCOPY;  Service: Cardiovascular;  Laterality: N/A;  . CARDIOVERSION N/A 10/17/2013   Procedure: CARDIOVERSION;  Surgeon: Lelon Perla, MD;  Location: [facility] ENDOSCOPY;  Service: Cardiovascular;  Laterality: N/A;  . CATARACT EXTRACTION  W/ INTRAOCULAR LENS  IMPLANT, BILATERAL Bilateral   . CORONARY ANGIOPLASTY    . INSERT / REPLACE / REMOVE PACEMAKER    . KNEE ARTHROSCOPY Right   . LUMBAR DISC SURGERY    . PERMANENT PACEMAKER INSERTION N/A 07/15/2013   Procedure: PERMANENT PACEMAKER INSERTION;  Surgeon: Deboraha Sprang, MD;  Location: [facility] CATH LAB;  Service: Cardiovascular;  Laterality: N/A;  . SHOULDER ARTHROSCOPY Right   . THROAT SURGERY  ~ 1950   "exploratory; felt like I had something in my throat & they went in to look around"  . TONSILLECTOMY      Current Outpatient Prescriptions  Medication Sig Dispense Refill  . carvedilol (COREG) 6.25 MG tablet Take 1 tablet (6.25 mg total) by mouth 2 (two) times daily with a meal. 60 tablet 5  . ELIQUIS 2.5 MG TABS tablet TAKE 1 TABLET BY MOUTH TWICE A DAY 180 tablet 1  . fenofibrate (TRICOR) 48 MG tablet Take 0.5 tablets (24 mg total) by mouth daily. 45 tablet 1  . fish oil-omega-3 fatty acids 1000 MG capsule Take 2 g by mouth daily.     Marland Kitchen gabapentin (NEURONTIN) 300 MG capsule Take 300 mg by mouth 2 (two) times daily.  12  . guaiFENesin (MUCINEX) 600 MG 12 hr tablet Take 600 mg by mouth 2 (two) times daily as needed for cough  or to loosen phlegm (COLDS).     Marland Kitchen levothyroxine (SYNTHROID, LEVOTHROID) 75 MCG tablet Take 1 tablet (75 mcg total) by mouth daily. 30 tablet 6  . Multiple Vitamins-Minerals (OCUVITE PO) Take 2 tablets by mouth daily.     Marland Kitchen omeprazole (PRILOSEC) 20 MG capsule Take 20 mg by mouth daily.     . potassium chloride (KLOR-CON) 20 MEQ packet Take 20 mEq by mouth daily.    . predniSONE (DELTASONE) 10 MG tablet Take 5 mg by mouth daily.  12  . tamsulosin (FLOMAX) 0.4 MG CAPS capsule Take 0.4 mg by mouth 2 (two) times daily.     Marland Kitchen torsemide (DEMADEX) 20 MG tablet Take 2 tablets (40 mg total) by mouth 2 (two) times daily. 360 tablet 3  . triazolam (HALCION) 0.25 MG tablet Take 0.5 tablets (0.125 mg total) by mouth at bedtime as needed for sleep. 15 tablet 0   No  current facility-administered medications for this visit.     No Known Allergies  Review of Systems negative except from HPI and PMH  Physical Exam BP 110/60   Pulse 72   Ht 5\' 10"  (1.778 m)   Wt 177 lb 9.6 oz (80.6 kg)   SpO2 92%   BMI 25.48 kg/m  Well developed and well nourished in no acute distress HENT normal E scleral and icterus clear Neck Supple JVP flat; carotids brisk and full Clear to ausculation  Regular rate and rhythm, no murmurs gallops or rub Soft with active bowel sounds No clubbing cyanosis  Edema Alert and oriented, grossly normal motor and sensory function Skin Warm and Dry  ECG demonstrates Vpacing   Assessment and  Plan  Atrial fib/flutter-persistent  High Risk Medication   Amiodarone  Atrial lead failure  HFrEF  Alternating bundle branch block  Dyspnea  CRT-P  St Jude   The patient has worsening heart failure in the setting of persistent atrial fibrillation loss of biventricular pacing. 3 alternatives present themselves. The first is to manage him dramatically adjust his diuretics. He is now enrolled ICM clinic making some headway in this regard. Another is cardioversion. This is a low risk procedure. He had significant duration of atrial fibrillation free interval following his last cardioversion. I think this is the most reasonable I discussed this with Dr. MS who is in agreement. The third would be AV junction ablation.  The latter 2 are certainly raising of eyebrows and this 83 year old man but is otherwise quite vigorous sound of mind and body.  We will arrange followup with DR MS to follow   We will be available for further discussion re AV ablation which I think too is relatively high yield low risk   For now will continue his current diuretics  He has not missed diuretics

## 2016-07-04 NOTE — Anesthesia Procedure Notes (Signed)
Date/Time: 07/04/2016 9:38 AM Performed by: Trixie Deis A Oxygen Delivery Method: Ambu bag Preoxygenation: Pre-oxygenation with 100% oxygen

## 2016-07-04 NOTE — Transfer of Care (Signed)
Immediate Anesthesia Transfer of Care Note  Patient: Levi Lewis  Procedure(s) Performed: Procedure(s): CARDIOVERSION (N/A)  Patient Location: Endoscopy Unit  Anesthesia Type:General  Level of Consciousness: sedated  Airway & Oxygen Therapy: Patient Spontanous Breathing and Patient connected to nasal cannula oxygen  Post-op Assessment: Report given to RN and Post -op Vital signs reviewed and stable  Post vital signs: Reviewed and stable  Last Vitals:  Vitals:   07/04/16 0836  BP: 122/62  Pulse: 78  Resp: (!) 21  Temp: 36.4 C    Last Pain:  Vitals:   07/04/16 0836  TempSrc: Oral         Complications: No apparent anesthesia complications

## 2016-07-04 NOTE — Discharge Instructions (Signed)
Electrical Cardioversion, Care After °This sheet gives you information about how to care for yourself after your procedure. Your health care provider may also give you more specific instructions. If you have problems or questions, contact your health care provider. °What can I expect after the procedure? °After the procedure, it is common to have: °· Some redness on the skin where the shocks were given. °Follow these instructions at home: °· Do not drive for 24 hours if you were given a medicine to help you relax (sedative). °· Take over-the-counter and prescription medicines only as told by your health care provider. °· Ask your health care provider how to check your pulse. Check it often. °· Rest for 48 hours after the procedure or as told by your health care provider. °· Avoid or limit your caffeine use as told by your health care provider. °Contact a health care provider if: °· You feel like your heart is beating too quickly or your pulse is not regular. °· You have a serious muscle cramp that does not go away. °Get help right away if: °· You have discomfort in your chest. °· You are dizzy or you feel faint. °· You have trouble breathing or you are short of breath. °· Your speech is slurred. °· You have trouble moving an arm or leg on one side of your body. °· Your fingers or toes turn cold or blue. °This information is not intended to replace advice given to you by your health care provider. Make sure you discuss any questions you have with your health care provider. °Document Released: 01/23/2013 Document Revised: 11/06/2015 Document Reviewed: 10/09/2015 °Elsevier Interactive Patient Education © 2017 [employer] ° °

## 2016-07-04 NOTE — Interval H&P Note (Signed)
History and Physical Interval Note:  07/04/2016 8:43 AM  Levi Lewis  has presented today for surgery, with the diagnosis of afib  The various methods of treatment have been discussed with the patient and family. After consideration of risks, benefits and other options for treatment, the patient has consented to  Procedure(s): CARDIOVERSION (N/A) as a surgical intervention .  The patient's history has been reviewed, patient examined, no change in status, stable for surgery.  I have reviewed the patient's chart and labs.  Questions were answered to the patient's satisfaction.     Samatha Anspach

## 2016-07-04 NOTE — Anesthesia Preprocedure Evaluation (Addendum)
Anesthesia Evaluation  Patient identified by MRN, date of birth, ID band Patient awake    Reviewed: Allergy & Precautions, H&P , NPO status , Patient's Chart, lab work & pertinent test results  History of Anesthesia Complications Negative for: history of anesthetic complications  Airway Mallampati: I  TM Distance: >3 FB Neck ROM: Full    Dental  (+) Teeth Intact, Dental Advisory Given   Pulmonary shortness of breath, COPD,    Pulmonary exam normal breath sounds clear to auscultation       Cardiovascular hypertension, + CAD and +CHF  + dysrhythmias Atrial Fibrillation + pacemaker  Rhythm:irregular Rate:Abnormal     Neuro/Psych    GI/Hepatic GERD  ,  Endo/Other  Hypothyroidism   Renal/GU Renal InsufficiencyRenal disease     Musculoskeletal  (+) Arthritis ,   Abdominal Normal abdominal exam  (+)   Peds  Hematology   Anesthesia Other Findings   Reproductive/Obstetrics                           Anesthesia Physical Anesthesia Plan  ASA: III  Anesthesia Plan: General   Post-op Pain Management:    Induction: Intravenous  Airway Management Planned: Mask  Additional Equipment:   Intra-op Plan:   Post-operative Plan:   Informed Consent: I have reviewed the patients History and Physical, chart, labs and discussed the procedure including the risks, benefits and alternatives for the proposed anesthesia with the patient or authorized representative who has indicated his/her understanding and acceptance.   Dental advisory given  Plan Discussed with: Anesthesiologist, CRNA and Surgeon  Anesthesia Plan Comments:        Anesthesia Quick Evaluation

## 2016-07-04 NOTE — Anesthesia Postprocedure Evaluation (Addendum)
Anesthesia Post Note  Patient: Levi Lewis  Procedure(s) Performed: Procedure(s) (LRB): CARDIOVERSION (N/A)  Patient location during evaluation: PACU Anesthesia Type: General Level of consciousness: awake and alert and patient cooperative Pain management: pain level controlled Vital Signs Assessment: post-procedure vital signs reviewed and stable Respiratory status: spontaneous breathing and respiratory function stable Cardiovascular status: stable Anesthetic complications: no       Last Vitals:  Vitals:   07/04/16 1000 07/04/16 1010  BP: (!) 92/51 (!) 101/50  Pulse: 82 86  Resp: 16   Temp:      Last Pain:  Vitals:   07/04/16 0946  TempSrc: Oral                 [name] S

## 2016-07-05 ENCOUNTER — Other Ambulatory Visit: Payer: Self-pay | Admitting: Internal Medicine

## 2016-07-05 ENCOUNTER — Encounter (HOSPITAL_COMMUNITY): Payer: Self-pay | Admitting: Cardiovascular Disease

## 2016-07-05 DIAGNOSIS — I5022 Chronic systolic (congestive) heart failure: Secondary | ICD-10-CM

## 2016-07-08 ENCOUNTER — Telehealth: Payer: Self-pay | Admitting: Cardiology

## 2016-07-08 NOTE — Telephone Encounter (Signed)
Remote transmission received. Printed and gave to MD nurse.

## 2016-07-08 NOTE — Telephone Encounter (Signed)
-----   Message from [place] sent at 07/08/2016 12:35 PM EDT ----- Regarding: remote transmission Dr C did a cardioversion on this patient recently, PCP's office sent over EKG. Per Dr C - check a remote transmission and will forward ECG to Dr Caryl Comes (as he is a [facility] pt)  Thank you.

## 2016-07-08 NOTE — Telephone Encounter (Signed)
Transmission report received. Will review with Dr. Caryl Comes on his return to the office.

## 2016-07-08 NOTE — Telephone Encounter (Signed)
Spoke w/ pt and instructed him how to send remote transmission. Pt verbalized understanding and started the transmission.

## 2016-07-13 ENCOUNTER — Other Ambulatory Visit: Payer: Self-pay | Admitting: Internal Medicine

## 2016-07-14 NOTE — Telephone Encounter (Signed)
Dr. Caryl Comes reviewed with Dr. Marlou Porch- per Dr. Marlou Porch, since rate is controlled will make no changes at this time. The patient is scheduled to follow up with Dr. Marlou Porch on 08/02/16.

## 2016-07-29 ENCOUNTER — Ambulatory Visit: Payer: Medicare Other | Admitting: Cardiology

## 2016-08-01 ENCOUNTER — Ambulatory Visit (INDEPENDENT_AMBULATORY_CARE_PROVIDER_SITE_OTHER): Payer: Medicare Other

## 2016-08-01 DIAGNOSIS — Z950 Presence of cardiac pacemaker: Secondary | ICD-10-CM | POA: Diagnosis not present

## 2016-08-01 DIAGNOSIS — I5022 Chronic systolic (congestive) heart failure: Secondary | ICD-10-CM | POA: Diagnosis not present

## 2016-08-01 NOTE — Progress Notes (Signed)
EPIC Encounter for ICM Monitoring  Patient Name: Levi Lewis is a 83 y.o. male Date: 08/01/2016 Primary Care Physican: Criselda Peaches, MD Primary Cardiologist:Skains/Thompson, PA Electrophysiologist: Caryl Comes Dry Weight:173 lbs Bi-V Pacing: 70%           Spoke with son.  Heart Failure questions reviewed, pt asymptomatic.  Baseline weight is around 173 lbs.   Thoracic impedance normal.  Prescribed Torsemide 20 mg 2 tablets (40 mg total) by mouth 2 (two) times daily.  Potassium 20 mEq 1 tablet daily  Labs: 06/28/2016 Creatinine 1.87, BUN 40, Potassium 4.7, Sodium 144, EGFR 29-34 06/10/2016 Creatinine 1.95, BUN 41, Potassium 4.6, Sodium 142, EGFR 28-32 05/16/2016 Creatinine 1.92, BUN 34, Potassium 4.2, Sodium 142 12/14/2017Creatinine 1.48, BUN 36, Potassium 3.8, Sodium 141 03/20/2016 Creatinine 1.90, BUN 37, Potassium 3.9, Sodium 141, EGFR 28-33  03/19/2016 Creatinine 2.03, BUN 37, Potassium 4.0, Sodium 141, EGFR 26-30  03/18/2016 Creatinine 1.96, BUN 35, Potassium 3.6, Sodium 142, EGFR 27-31  03/17/2016 Creatinine 1.76, BUN 29, Potassium 3.5, Sodium 142, EGFR 31-36 03/16/2016 Creatinine 1.79, BUN 29, Potassium 3.8, Sodium 142, EGFR 30-35  03/15/2016 Creatinine 1.87, BUN 4.1, Potassium 4.1, Sodium 137, EGFR 29-33  12/03/2015 Creatinine 1.70, BUN 36, Potassium 3.9, Sodium 144  10/28/2015 Creatinine 1.59, BUN 38, Potassium 4.7, Sodium 143  10/21/2015 Creatinine 1.73, BUN 28, Potassium 4.3, Sodium 139, EGFR 32-37   Recommendations: No changes. Discussed to limit salt intake to 2000 mg/day and fluid intake to < 2 liters/day.  Encouraged to call for fluid symptoms.  Follow-up plan: ICM clinic phone appointment on 09/01/2016.  Office appointment scheduled on 08/02/2016 with Dr Marlou Porch and will send copy of ICM report for him to review.  Copy of ICM check sent to primary cardiologist and device physician.   3 month ICM trend: 08/01/2016     1 Year ICM trend:      Rosalene Billings, RN 08/01/2016 9:10 AM

## 2016-08-02 ENCOUNTER — Encounter: Payer: Self-pay | Admitting: Cardiology

## 2016-08-02 ENCOUNTER — Ambulatory Visit (INDEPENDENT_AMBULATORY_CARE_PROVIDER_SITE_OTHER): Payer: Medicare Other | Admitting: Cardiology

## 2016-08-02 VITALS — BP 110/62 | HR 70 | Ht 70.0 in | Wt 174.0 lb

## 2016-08-02 DIAGNOSIS — I420 Dilated cardiomyopathy: Secondary | ICD-10-CM | POA: Diagnosis not present

## 2016-08-02 DIAGNOSIS — I481 Persistent atrial fibrillation: Secondary | ICD-10-CM | POA: Diagnosis not present

## 2016-08-02 DIAGNOSIS — I5022 Chronic systolic (congestive) heart failure: Secondary | ICD-10-CM

## 2016-08-02 DIAGNOSIS — Z950 Presence of cardiac pacemaker: Secondary | ICD-10-CM

## 2016-08-02 DIAGNOSIS — I2510 Atherosclerotic heart disease of native coronary artery without angina pectoris: Secondary | ICD-10-CM

## 2016-08-02 DIAGNOSIS — N189 Chronic kidney disease, unspecified: Secondary | ICD-10-CM

## 2016-08-02 DIAGNOSIS — I255 Ischemic cardiomyopathy: Secondary | ICD-10-CM

## 2016-08-02 DIAGNOSIS — I4819 Other persistent atrial fibrillation: Secondary | ICD-10-CM

## 2016-08-02 LAB — BASIC METABOLIC PANEL
BUN / CREAT RATIO: 20 (ref 10–24)
BUN: 45 mg/dL — AB (ref 10–36)
CALCIUM: 8.9 mg/dL (ref 8.6–10.2)
CHLORIDE: 102 mmol/L (ref 96–106)
CO2: 22 mmol/L (ref 18–29)
CREATININE: 2.22 mg/dL — AB (ref 0.76–1.27)
GFR calc Af Amer: 28 mL/min/{1.73_m2} — ABNORMAL LOW (ref >59)
GFR calc non Af Amer: 24 mL/min/{1.73_m2} — ABNORMAL LOW (ref >59)
GLUCOSE: 118 mg/dL — AB (ref 65–99)
Potassium: 4.2 mmol/L (ref 3.5–5.2)
Sodium: 142 mmol/L (ref 134–144)

## 2016-08-02 NOTE — Progress Notes (Signed)
Cardiology Office Note    Date:  08/02/2016   ID:  Levi Lewis, DOB October 16, 1917, MRN [id number]  PCP:  Criselda Peaches, MD  Cardiologist:   Candee Furbish, MD     History of Present Illness:  Levi Lewis is a 83 y.o. male with Bi-V pacemaker Dr. Caryl Comes, persistent atrial fibriallation despite cardioversion attempt 06/2016 with CAD, dilated cardiomyopathy, EF 25% here for follow up.   Overall he has been feeling fairly well. Still having some dyspnea on exertion. No chest pain, no syncope. He does not like taking the potassium 20 mEq because of the texture of the tablets. His last few potassiums have been 4.2, 4.6, 4.7 with a creatinine of 1.87.      Past Medical History:  Diagnosis Date  . Aortic insufficiency   . Arthritis    "back, knees, right leg" (03/15/2016)  . Atrial fibrillation ([place])   . CAD (coronary artery disease)   . Cardiomyopathy, EF by echo 25-30% 07/14/2013  . CHF (congestive heart failure) ([place])   . Chronic lower back pain   . COPD (chronic obstructive pulmonary disease) ([place])   . Dyslipidemia   . GERD (gastroesophageal reflux disease)   . Gout   . Hypertension   . Hypothyroidism   . Left bundle branch block   . Lung nodules   . Mitral regurgitation   . On home oxygen therapy    "2L; 24/7 for the last week" (03/15/2016)  . Pacemaker   . Pneumonia    "2-3 times" (03/15/2016)  . RBBB   . Shortness of breath   . Shoulder pain    left; limited ROM  . Skin cancer    "left side of my nose; had another one maybe off my back" (03/15/2016)    Past Surgical History:  Procedure Laterality Date  . BACK SURGERY    . BI-VENTRICULAR PACEMAKER INSERTION (CRT-P)  07-15-2013   STJ Quadra Allura CRTP implanted by Dr Caryl Comes for NICM, CHF, alternating bundle branch blocks  . CARDIOVERSION N/A 09/13/2013   Procedure: CARDIOVERSION;  Surgeon: Carlena Bjornstad, MD;  [facility] ENDOSCOPY;  Service: Cardiovascular;  Laterality: N/A;  . CARDIOVERSION N/A 10/17/2013   Procedure: CARDIOVERSION;  Surgeon: Lelon Perla, MD;  Location: [place];  Service: Cardiovascular;  Laterality: N/A;  . CARDIOVERSION N/A 07/04/2016   Procedure: CARDIOVERSION;  Surgeon: Sanda Klein, MD;  Location: [facility] ENDOSCOPY;  Service: Cardiovascular;  Laterality: N/A;  . CATARACT EXTRACTION W/ INTRAOCULAR LENS  IMPLANT, BILATERAL Bilateral   . CORONARY ANGIOPLASTY    . INSERT / REPLACE / REMOVE PACEMAKER    . KNEE ARTHROSCOPY Right   . LUMBAR DISC SURGERY    . PERMANENT PACEMAKER INSERTION N/A 07/15/2013   Procedure: PERMANENT PACEMAKER INSERTION;  Surgeon: Deboraha Sprang, MD;  Location: [facility] CATH LAB;  Service: Cardiovascular;  Laterality: N/A;  . SHOULDER ARTHROSCOPY Right   . THROAT SURGERY  ~ 1950   "exploratory; felt like I had something in my throat & they went in to look around"  . TONSILLECTOMY      Current Medications: Outpatient Medications Prior to Visit  Medication Sig Dispense Refill  . carvedilol (COREG) 6.25 MG tablet Take 1 tablet (6.25 mg total) by mouth 2 (two) times daily with a meal. 60 tablet 5  . colchicine 0.6 MG tablet Take 0.6 mg by mouth daily as needed.    Marland Kitchen ELIQUIS 2.5 MG TABS tablet TAKE 1 TABLET BY MOUTH TWICE A DAY 180 tablet  1  . fenofibrate (TRICOR) 48 MG tablet Take 0.5 tablets (24 mg total) by mouth daily. 45 tablet 1  . fish oil-omega-3 fatty acids 1000 MG capsule Take 2 g by mouth daily.     Marland Kitchen gabapentin (NEURONTIN) 300 MG capsule Take 300 mg by mouth 2 (two) times daily.  12  . guaiFENesin (MUCINEX) 600 MG 12 hr tablet Take 600 mg by mouth 2 (two) times daily as needed for cough or to loosen phlegm (COLDS).     Marland Kitchen levothyroxine (SYNTHROID, LEVOTHROID) 75 MCG tablet Take 1 tablet (75 mcg total) by mouth daily. 30 tablet 6  . Multiple Vitamins-Minerals (OCUVITE PO) Take 2 tablets by mouth daily.     Marland Kitchen omeprazole (PRILOSEC) 20 MG capsule Take 20 mg by mouth daily.     . potassium chloride SA (KLOR-CON M20) 20 MEQ tablet Take 1 tablet (20 mEq  total) by mouth daily. 30 tablet 1  . predniSONE (DELTASONE) 10 MG tablet Take 5 mg by mouth daily.  12  . tamsulosin (FLOMAX) 0.4 MG CAPS capsule Take 0.4 mg by mouth 2 (two) times daily.     Marland Kitchen torsemide (DEMADEX) 20 MG tablet Take 2 tablets (40 mg total) by mouth 2 (two) times daily. 360 tablet 3  . triazolam (HALCION) 0.25 MG tablet Take 0.5 tablets (0.125 mg total) by mouth at bedtime as needed for sleep. 15 tablet 0  . potassium chloride (KLOR-CON) 20 MEQ packet Take 20 mEq by mouth daily.     No facility-administered medications prior to visit.      Allergies:   Patient has no known allergies.   Social History   Social History  . Marital status: Married    Spouse name: N/A  . Number of children: N/A  . Years of education: N/A   Social History Main Topics  . Smoking status: Never Smoker  . Smokeless tobacco: Never Used  . Alcohol use No  . Drug use: No  . Sexual activity: Not Currently   Other Topics Concern  . None   Social History Narrative   He has never smoked or drank. No illcit drug use . He is retired from the [employer] and lives with his wife. Illicit Drug Use- no     Family History:  The patient's family history includes Heart attack in his sister; Lung cancer in his brother.   ROS:   Please see the history of present illness.    ROS All other systems reviewed and are negative.   PHYSICAL EXAM:   VS:  BP 110/62   Pulse 70   Ht 5\' 10"  (1.778 m)   Wt 174 lb (78.9 kg)   SpO2 95%   BMI 24.97 kg/m    GEN: Elderly, in no acute distress  HEENT: normal right-sided facial mass noted Neck: no JVD, carotid bruits, or masses Cardiac: RRR occasional ectopy; no murmurs, rubs, or gallops,no edema  Respiratory:  clear to auscultation bilaterally, normal work of breathing GI: soft, nontender, nondistended, + BS MS: no deformity or atrophy  Skin: warm and dry, no rash Neuro:  Alert and Oriented x 3, Strength and sensation are intact Psych: euthymic mood, full  affect  Wt Readings from Last 3 Encounters:  08/02/16 174 lb (78.9 kg)  06/28/16 177 lb 9.6 oz (80.6 kg)  04/27/16 174 lb (78.9 kg)      Studies/Labs Reviewed:   EKG:  None today  Recent Labs: 03/15/2016: ALT 19; B Natriuretic Peptide 1,097.9; Magnesium 2.3; TSH  2.055 03/19/2016: Hemoglobin 11.8; Platelets 350 06/28/2016: BUN 40; Creatinine, Ser 1.87; Potassium 4.7; Sodium 144   Lipid Panel    Component Value Date/Time   CHOL 166 09/17/2010 1032   TRIG 224.0 (H) 09/17/2010 1032   HDL 37.40 (L) 09/17/2010 1032   CHOLHDL 4 09/17/2010 1032   VLDL 44.8 (H) 09/17/2010 1032   LDLCALC (H) 08/07/2008 0500    113        Total Cholesterol/HDL:CHD Risk Coronary Heart Disease Risk Table                     Men   Women  1/2 Average Risk   3.4   3.3  Average Risk       5.0   4.4  2 X Average Risk   9.6   7.1  3 X Average Risk  23.4   11.0        Use the calculated Patient Ratio above and the CHD Risk Table to determine the patient's CHD Risk.        ATP III CLASSIFICATION (LDL):  <100     mg/dL   Optimal  100-129  mg/dL   Near or Above                    Optimal  130-159  mg/dL   Borderline  160-189  mg/dL   High  >190     mg/dL   Very High   LDLDIRECT 109.6 09/17/2010 1032    Additional studies/ records that were reviewed today include:  Biventricular pacer transmission reviewed., Lab work reviewed, prior office notes reviewed, correspondence with Dr. Caryl Comes    ASSESSMENT:    1. Persistent atrial fibrillation ([place])   2. Chronic systolic CHF (congestive heart failure) ([place])   3. Coronary artery disease involving native coronary artery of native heart without angina pectoris   4. Dilated cardiomyopathy (H[facility])      PLAN:  In order of problems listed above:  Persistent atrial fibrillation  - Was unable to maintain sinus rhythm after cardioversion  - Biventricular pacing 70% of the time.  - Anticoagulation with Eliquis  - Has not had a nosebleed in a while  Chronic  systolic heart failure  - EF 25%  - On carvedilol 6.25 mg twice a day. Unable to utilize ACE inhibitor because of chronic kidney disease stage IV.  - Does not appear to be overloaded on exam today. Pacemaker transmission would agree.  - Continue to advocate movement  - Weight is stable.  - Torsemide seems to be working well.  - His potassium has slowly been increasing. We will check another basic metabolic profile today. If his potassium remains high normal, we will prescribe potassium 10 mEq once a day. He does state that these tablets are very challenging for him to swallow.  Coronary artery disease  - No anginal symptoms.     Medication Adjustments/Labs and Tests Ordered: Current medicines are reviewed at length with the patient today.  Concerns regarding medicines are outlined above.  Medication changes, Labs and Tests ordered today are listed in the Patient Instructions below. There are no Patient Instructions on file for this visit.   Signed, Candee Furbish, MD  08/02/2016 8:48 AM    [facility] [place], [place], [place]  [address] Phone: [phone]; Fax: [phone]

## 2016-08-02 NOTE — Patient Instructions (Signed)
Medication Instructions:  The current medical regimen is effective;  continue present plan and medications.  Labwork: Please have blood work today. (BMP)  Follow-Up: Follow up with Bonney Leitz, PA in 3 months. Follow up with Dr Marlou Porch in 6 months.  If you need a refill on your cardiac medications before your next appointment, please call your pharmacy.  Thank you for choosing [facility]!!

## 2016-09-01 ENCOUNTER — Other Ambulatory Visit: Payer: Self-pay | Admitting: Cardiology

## 2016-09-01 ENCOUNTER — Ambulatory Visit (INDEPENDENT_AMBULATORY_CARE_PROVIDER_SITE_OTHER): Payer: Medicare Other

## 2016-09-01 DIAGNOSIS — I5022 Chronic systolic (congestive) heart failure: Secondary | ICD-10-CM | POA: Diagnosis not present

## 2016-09-01 DIAGNOSIS — Z950 Presence of cardiac pacemaker: Secondary | ICD-10-CM | POA: Diagnosis not present

## 2016-09-01 NOTE — Progress Notes (Signed)
EPIC Encounter for ICM Monitoring  Patient Name: Levi Lewis is a 83 y.o. male Date: 09/01/2016 Primary Care Physican: [name], MD Primary Cardiologist:Skains/Thompson, PA Electrophysiologist: Caryl Comes Dry Weight:172 lbs Bi-V Pacing: 69%        Call to son, Savvas Roper.  Heart Failure questions reviewed, pt asymptomatic    Thoracic impedance normal but was abnormal suggesting fluid accumulation 08/14/2016 to 08/25/2016.  Prescribed Torsemide 20 mg 2 tablets (40 mg total) by mouth 2 (two) times daily.  Potassium 20 mEq 1 tablet daily  Labs: 08/02/2016 Creatinine 2.22, BUN 45, Potassium 4.2, Sodium 142, EGFR 24-28 06/28/2016 Creatinine 1.87, BUN 40, Potassium 4.7, Sodium 144, EGFR 29-34 06/10/2016 Creatinine 1.95, BUN 41, Potassium 4.6, Sodium 142, EGFR 28-32 05/16/2016 Creatinine 1.92, BUN 34, Potassium 4.2, Sodium 142 12/14/2017Creatinine 1.48, BUN 36, Potassium 3.8, Sodium 141 03/20/2016 Creatinine 1.90, BUN 37, Potassium 3.9, Sodium 141, EGFR 28-33  03/19/2016 Creatinine 2.03, BUN 37, Potassium 4.0, Sodium 141, EGFR 26-30  03/18/2016 Creatinine 1.96, BUN 35, Potassium 3.6, Sodium 142, EGFR 27-31  03/17/2016 Creatinine 1.76, BUN 29, Potassium 3.5, Sodium 142, EGFR 31-36 03/16/2016 Creatinine 1.79, BUN 29, Potassium 3.8, Sodium 142, EGFR 30-35  03/15/2016 Creatinine 1.87, BUN 4.1, Potassium 4.1, Sodium 137, EGFR 29-33  12/03/2015 Creatinine 1.70, BUN 36, Potassium 3.9, Sodium 144  10/28/2015 Creatinine 1.59, BUN 38, Potassium 4.7, Sodium 143  10/21/2015 Creatinine 1.73, BUN 28, Potassium 4.3, Sodium 139, EGFR 32-37   Recommendations: No changes.  Encouraged to call for fluid symptoms or use local ER for any urgent symptoms.  Follow-up plan: ICM clinic phone appointment on 10/04/2016.  Office appointment scheduled on 10/24/2016 with Angelena Form, PA.  Copy of ICM check sent to device physician.   3 month ICM trend: 09/01/2016   1 Year ICM trend:      Rosalene Billings, RN 09/01/2016 9:32 AM

## 2016-09-15 ENCOUNTER — Other Ambulatory Visit: Payer: Self-pay | Admitting: Internal Medicine

## 2016-09-15 DIAGNOSIS — I5022 Chronic systolic (congestive) heart failure: Secondary | ICD-10-CM

## 2016-10-04 ENCOUNTER — Ambulatory Visit (INDEPENDENT_AMBULATORY_CARE_PROVIDER_SITE_OTHER): Payer: Medicare Other | Admitting: *Deleted

## 2016-10-04 DIAGNOSIS — Z950 Presence of cardiac pacemaker: Secondary | ICD-10-CM | POA: Diagnosis not present

## 2016-10-04 DIAGNOSIS — I495 Sick sinus syndrome: Secondary | ICD-10-CM | POA: Diagnosis not present

## 2016-10-04 DIAGNOSIS — I5022 Chronic systolic (congestive) heart failure: Secondary | ICD-10-CM

## 2016-10-04 NOTE — Progress Notes (Signed)
Remote pacemaker transmission.   

## 2016-10-04 NOTE — Progress Notes (Signed)
EPIC Encounter for ICM Monitoring  Patient Name: Levi Lewis is a 83 y.o. male Date: 10/04/2016 Primary Care Physican: [name], MD Primary Cardiologist:Skains/Thompson, PA Electrophysiologist: Faustino Congress Weight:Last known weight 172lbs Bi-V Pacing: 72%     AT/AF Burden >99%       Spoke with son.  Heart Failure questions reviewed, pt asymptomatic.   Thoracic impedance normal.  Prescribed Torsemide 20 mg 2 tablets (40 mg total) by mouth 2 (two) times daily. Potassium 20 mEq 1 tablet daily  Labs: 08/02/2016 Creatinine 2.22, BUN 45, Potassium 4.2, Sodium 142, EGFR 24-28 06/28/2016 Creatinine 1.87, BUN 40, Potassium 4.7, Sodium 144, EGFR 29-34 06/10/2016 Creatinine 1.95, BUN 41, Potassium 4.6, Sodium 142, EGFR 28-32 05/16/2016 Creatinine 1.92, BUN 34, Potassium 4.2, Sodium 142 12/14/2017Creatinine 1.48, BUN 36, Potassium 3.8, Sodium 141 03/20/2016 Creatinine 1.90, BUN 37, Potassium 3.9, Sodium 141, EGFR 28-33  03/19/2016 Creatinine 2.03, BUN 37, Potassium 4.0, Sodium 141, EGFR 26-30  03/18/2016 Creatinine 1.96, BUN 35, Potassium 3.6, Sodium 142, EGFR 27-31  03/17/2016 Creatinine 1.76, BUN 29, Potassium 3.5, Sodium 142, EGFR 31-36 03/16/2016 Creatinine 1.79, BUN 29, Potassium 3.8, Sodium 142, EGFR 30-35  03/15/2016 Creatinine 1.87, BUN 4.1, Potassium 4.1, Sodium 137, EGFR 29-33  12/03/2015 Creatinine 1.70, BUN 36, Potassium 3.9, Sodium 144  10/28/2015 Creatinine 1.59, BUN 38, Potassium 4.7, Sodium 143  10/21/2015 Creatinine 1.73, BUN 28, Potassium 4.3, Sodium 139, EGFR 32-37  Recommendations: No changes.  Encouraged to call for fluid symptoms.  Follow-up plan: ICM clinic phone appointment on 11/04/2016.  Office appointment scheduled on 10/24/2016 with Angelena Form, PA.  Copy of ICM check sent to device physician.   3 month ICM trend: 10/04/2016           1 Year ICM trend:   Rosalene Billings, RN 10/04/2016 3:32 PM

## 2016-10-05 ENCOUNTER — Encounter: Payer: Self-pay | Admitting: Physician Assistant

## 2016-10-05 LAB — CUP PACEART REMOTE DEVICE CHECK
Brady Statistic AP VP Percent: 42 %
Brady Statistic AP VS Percent: 7.2 %
Brady Statistic AS VS Percent: 6.2 %
Date Time Interrogation Session: 20180619092944
Implantable Lead Implant Date: 20150330
Implantable Lead Location: 753859
Lead Channel Impedance Value: 430 Ohm
Lead Channel Pacing Threshold Amplitude: 0.75 V
Lead Channel Pacing Threshold Amplitude: 1 V
Lead Channel Pacing Threshold Pulse Width: 0.4 ms
Lead Channel Pacing Threshold Pulse Width: 0.4 ms
Lead Channel Sensing Intrinsic Amplitude: 8.7 mV
Lead Channel Setting Pacing Amplitude: 1.5 V
Lead Channel Setting Pacing Pulse Width: 0.4 ms
Lead Channel Setting Sensing Sensitivity: 2 mV
MDC IDC LEAD IMPLANT DT: 20150330
MDC IDC LEAD IMPLANT DT: 20150330
MDC IDC LEAD LOCATION: 753858
MDC IDC LEAD LOCATION: 753860
MDC IDC MSMT BATTERY REMAINING LONGEVITY: 92 mo
MDC IDC MSMT BATTERY REMAINING PERCENTAGE: 95.5 %
MDC IDC MSMT BATTERY VOLTAGE: 2.98 V
MDC IDC MSMT LEADCHNL LV IMPEDANCE VALUE: 550 Ohm
MDC IDC MSMT LEADCHNL RA PACING THRESHOLD AMPLITUDE: 1 V
MDC IDC MSMT LEADCHNL RA PACING THRESHOLD PULSEWIDTH: 0.6 ms
MDC IDC MSMT LEADCHNL RA SENSING INTR AMPL: 0.2 mV
MDC IDC MSMT LEADCHNL RV IMPEDANCE VALUE: 430 Ohm
MDC IDC PG IMPLANT DT: 20150330
MDC IDC SET LEADCHNL LV PACING PULSEWIDTH: 0.4 ms
MDC IDC SET LEADCHNL RA PACING AMPLITUDE: 2.5 V
MDC IDC SET LEADCHNL RV PACING AMPLITUDE: 2.5 V
MDC IDC STAT BRADY AS VP PERCENT: 39 %
MDC IDC STAT BRADY RA PERCENT PACED: 17 %
Pulse Gen Model: 3242
Pulse Gen Serial Number: 2978403

## 2016-10-07 ENCOUNTER — Encounter: Payer: Self-pay | Admitting: Cardiology

## 2016-10-22 NOTE — Progress Notes (Deleted)
Cardiology Office Note    Date:  10/22/2016   ID:  Levi Lewis, DOB December 03, 1917, MRN [id number]  PCP:  Levi Erp, MD  Cardiologist:  Dr. Marlou Lewis /Dr .Levi Lewis   CC: 3 month follow up   History of Present Illness:  Levi Lewis is a 83 y.o. male with a history of persistnat atrial fibrillation, SSS s/p CTR-P, coronary artery disease (by CT scan), and renal insufficiency who presents to clinic for follow up.   He was admitted 11/28-12/3/35for acute on chronic systolic congestive heart failure and recurrent atrial fibrillation. BNP at time of admit was 1,097.  Diruesed 14 lbs (186 -->172). He was discharged on 80 mg BID.Renal function improved by discharge at 1.90. EF remainedlow but stable at20-25% on echo. He has ahistory of paroxysmal atrial fibrillation. Amiodarone was discontinued previously as there was concern it was causing pulmonary toxicity. It was felt that his recurrent atrial fibrillation was likely contributing to his CHF symptoms. Unfortunately, options for antiarrhythmics are limited by EF, CAD and renal function. It was recommended to continuerate control and anticoagulation. He was continued oncarvedilol 6.25 mg twice a day and continued on apixaban.  He has had worsening heart failure in the setting of persistent atrial fibrillation and loss of biventricular pacing. Lasix was converted to Torsemide for better volume control. He was seen in the office by Dr. Caryl Lewis on 06/28/16 and set up for DCCV, which was completed on 07/04/16. However, he was unable to maintain sinus rhythm after DCCV.   Today he presents to clinic for follow up.    Past Medical History:  Diagnosis Date  . Aortic insufficiency   . Arthritis    "back, knees, right leg" (03/15/2016)  . Atrial fibrillation ([place])   . CAD (coronary artery disease)   . Cardiomyopathy, EF by echo 25-30% 07/14/2013  . CHF (congestive heart failure) ([place])   . Chronic lower back pain   . COPD (chronic obstructive  pulmonary disease) ([place])   . Dyslipidemia   . GERD (gastroesophageal reflux disease)   . Gout   . Hypertension   . Hypothyroidism   . Left bundle branch block   . Lung nodules   . Mitral regurgitation   . On home oxygen therapy    "2L; 24/7 for the last week" (03/15/2016)  . Pacemaker   . Pneumonia    "2-3 times" (03/15/2016)  . RBBB   . Shortness of breath   . Shoulder pain    left; limited ROM  . Skin cancer    "left side of my nose; had another one maybe off my back" (03/15/2016)    Past Surgical History:  Procedure Laterality Date  . BACK SURGERY    . BI-VENTRICULAR PACEMAKER INSERTION (CRT-P)  07-15-2013   STJ Quadra Allura CRTP implanted by Dr Levi Lewis for NICM, CHF, alternating bundle branch blocks  . CARDIOVERSION N/A 09/13/2013   Procedure: CARDIOVERSION;  Surgeon: Levi Bjornstad, MD;  Location: [facility] ENDOSCOPY;  Service: Cardiovascular;  Laterality: N/A;  . CARDIOVERSION N/A 10/17/2013   Procedure: CARDIOVERSION;  Surgeon: Levi Perla, MD;  Location: [facility];  Service: Cardiovascular;  Laterality: N/A;  . CARDIOVERSION N/A 07/04/2016   Procedure: CARDIOVERSION;  Surgeon: Levi Klein, MD;  Location: [facility] ENDOSCOPY;  Service: Cardiovascular;  Laterality: N/A;  . CATARACT EXTRACTION W/ INTRAOCULAR LENS  IMPLANT, BILATERAL Bilateral   . CORONARY ANGIOPLASTY    . INSERT / REPLACE / REMOVE PACEMAKER    . KNEE ARTHROSCOPY Right   .  LUMBAR DISC SURGERY    . PERMANENT PACEMAKER INSERTION N/A 07/15/2013   Procedure: PERMANENT PACEMAKER INSERTION;  Surgeon: Levi Sprang, MD;  Location: [facility] CATH LAB;  Service: Cardiovascular;  Laterality: N/A;  . SHOULDER ARTHROSCOPY Right   . THROAT SURGERY  ~ 1950   "exploratory; felt like I had something in my throat & they went in to look around"  . TONSILLECTOMY      Current Medications: Outpatient Medications Prior to Visit  Medication Sig Dispense Refill  . carvedilol (COREG) 6.25 MG tablet TAKE 1 TABLET (6.25 MG TOTAL) BY MOUTH  2 (TWO) TIMES DAILY WITH A MEAL. 60 tablet 5  . colchicine 0.6 MG tablet Take 0.6 mg by mouth daily as needed.    Marland Kitchen ELIQUIS 2.5 MG TABS tablet TAKE 1 TABLET BY MOUTH TWICE A DAY 180 tablet 1  . fenofibrate (TRICOR) 48 MG tablet Take 0.5 tablets (24 mg total) by mouth daily. 45 tablet 1  . fish oil-omega-3 fatty acids 1000 MG capsule Take 2 g by mouth daily.     Marland Kitchen gabapentin (NEURONTIN) 300 MG capsule Take 300 mg by mouth 2 (two) times daily.  12  . guaiFENesin (MUCINEX) 600 MG 12 hr tablet Take 600 mg by mouth 2 (two) times daily as needed for cough or to loosen phlegm (COLDS).     Marland Kitchen KLOR-CON M20 20 MEQ tablet TAKE 1 TABLET BY MOUTH EVERY DAY 30 tablet 11  . levothyroxine (SYNTHROID, LEVOTHROID) 75 MCG tablet Take 1 tablet (75 mcg total) by mouth daily. 30 tablet 6  . Multiple Vitamins-Minerals (OCUVITE PO) Take 2 tablets by mouth daily.     Marland Kitchen omeprazole (PRILOSEC) 20 MG capsule Take 20 mg by mouth daily.     . predniSONE (DELTASONE) 10 MG tablet Take 5 mg by mouth daily.  12  . tamsulosin (FLOMAX) 0.4 MG CAPS capsule Take 0.4 mg by mouth 2 (two) times daily.     Marland Kitchen torsemide (DEMADEX) 20 MG tablet Take 2 tablets (40 mg total) by mouth 2 (two) times daily. 360 tablet 3  . triazolam (HALCION) 0.25 MG tablet Take 0.5 tablets (0.125 mg total) by mouth at bedtime as needed for sleep. 15 tablet 0   No facility-administered medications prior to visit.      Allergies:   Patient has no known allergies.   Social History   Social History  . Marital status: Married    Spouse name: N/A  . Number of children: N/A  . Years of education: N/A   Social History Main Topics  . Smoking status: Never Smoker  . Smokeless tobacco: Never Used  . Alcohol use No  . Drug use: No  . Sexual activity: Not Currently   Other Topics Concern  . Not on file   Social History Narrative   He has never smoked or drank. No illcit drug use . He is retired from the [employer] and lives with his wife. Illicit Drug  Use- no     Family History:  The patient's ***family history includes Heart attack in his sister; Lung cancer in his brother.      *** ROS/PE    Wt Readings from Last 3 Encounters:  08/02/16 174 lb (78.9 kg)  06/28/16 177 lb 9.6 oz (80.6 kg)  04/27/16 174 lb (78.9 kg)      Studies/Labs Reviewed:   EKG:  EKG is*** ordered today.  The ekg ordered today demonstrates ***  Recent Labs: 03/15/2016: ALT 19; B Natriuretic Peptide  1,097.9; Magnesium 2.3; TSH 2.055 03/19/2016: Hemoglobin 11.8; Platelets 350 08/02/2016: BUN 45; Creatinine, Ser 2.22; Potassium 4.2; Sodium 142   Lipid Panel    Component Value Date/Time   CHOL 166 09/17/2010 1032   TRIG 224.0 (H) 09/17/2010 1032   HDL 37.40 (L) 09/17/2010 1032   CHOLHDL 4 09/17/2010 1032   VLDL 44.8 (H) 09/17/2010 1032   LDLCALC (H) 08/07/2008 0500    113        Total Cholesterol/HDL:CHD Risk Coronary Heart Disease Risk Table                     Men   Women  1/2 Average Risk   3.4   3.3  Average Risk       5.0   4.4  2 X Average Risk   9.6   7.1  3 X Average Risk  23.4   11.0        Use the calculated Patient Ratio above and the CHD Risk Table to determine the patient's CHD Risk.        ATP III CLASSIFICATION (LDL):  <100     mg/dL   Optimal  100-129  mg/dL   Near or Above                    Optimal  130-159  mg/dL   Borderline  160-189  mg/dL   High  >190     mg/dL   Very High   LDLDIRECT 109.6 09/17/2010 1032    Additional studies/ records that were reviewed today include:  2D ECHO: 03/17/16 Study Conclusions - Left ventricle: The cavity size was mildly dilated. Systolic function was severely reduced. The estimated ejection fraction was in the range of 20% to 25%. Doppler parameters are consistent with restrictive physiology, indicative of decreased left ventricular diastolic compliance and/or increased left atrial pressure. Doppler parameters are consistent with elevated ventricular end-diastolic  filling pressure. - Ventricular septum: Septal motion showed paradox. - Aortic valve: Trileaflet; mildly thickened, mildly calcified leaflets. There was mild regurgitation. - Mitral valve: Structurally normal valve. There was moderate regurgitation. - Left atrium: The atrium was mildly dilated. - Right ventricle: Systolic function was normal. - Right atrium: The atrium was normal in size. - Tricuspid valve: There was moderate regurgitation. - Pulmonic valve: Structurally normal valve. There was no regurgitation. - Pulmonary arteries: Systolic pressure was mildly increased. PA peak pressure: 38 mm Hg (S). - Pericardium, extracardiac: There was no pericardial effusion. Impressions: - Severely reduced LVEF with diffuse hypokinesis, paradoxical septal motion and akinetic apical segments. There is no thrombus. Restrictive pattern of diastolic dysfunction.    ASSESSMENT & PLAN:   Chronic systolic CHF:   Persistent Atrial fibrillation:   CKD:  SSS s/p PPM:   COPD:    Medication Adjustments/Labs and Tests Ordered: Current medicines are reviewed at length with the patient today.  Concerns regarding medicines are outlined above.  Medication changes, Labs and Tests ordered today are listed in the Patient Instructions below. There are no Patient Instructions on file for this visit.   Signed, Angelena Form, PA-C  10/22/2016 3:50 PM    [facility] [place], [place], [place]  [address] Phone: [phone]; Fax: [phone]

## 2016-10-23 NOTE — Progress Notes (Signed)
Cardiology Office Note:    Date:  10/24/2016   ID:  Levi Lewis, DOB [birth year], MRN [id number]  PCP:  [name], MD  Cardiologist:  Dr. Marlou Porch Electrophysiologist:  Dr Caryl Comes  Referring MD: [name], MD   Chief Complaint  Patient presents with  . Follow-up    3 month    History of Present Illness:    Levi Lewis is a 83 y.o. male with a past medical history significant for persistnat atrial fibrillation, SSS s/p CRT-P, coronary artery disease (by CT scan), dilated CM with EF 25%  and renal insufficiency who presents to clinic for follow up.    He was admitted 11/28-12/3/22for acute on chronic systolic congestive heart failure and recurrent atrial fibrillation. BNP at time of admit was 1,097. Diruesed 14 lbs (186 -->172). He was discharged on Lasix 80 mg BID.Renal function improved by discharge at 1.90. EF remainedlow but stable at20-25% on echo. He has ahistory of paroxysmal atrial fibrillation. Amiodarone was discontinued previously as there was concern it was causing pulmonary toxicity. It was felt that his recurrent atrial fibrillation was likely contributing to his CHF symptoms. Unfortunately, options for antiarrhythmics are limited by EF, CAD and renal function. It was recommended to continuerate control and anticoagulation. He was continued oncarvedilol 6.25 mg twice a day and continued on apixaban.    He has had worsening heart failure in the setting of persistent atrial fibrillation and loss of biventricular pacing. Lasix was converted to Torsemide for better volume control. He was seen in the office by Dr. Caryl Comes on 06/28/16 and set up for DCCV, which was completed on 07/04/16. However, he was unable to maintain sinus rhythm after DCCV.   He continues to be in atrial fib today on EKG and pacemaker interrogation. He denies chest pain or increased shortness of breath. He has mild, brief lightheadedness upon standing once in a while. He has had a gout flare with  swelling of the feet that is being treated with prednisone and Allopurinol added. The flare is nearly resolved. He denies orthopnea or PND.  He feels like he is doing well.  Today he presents to clinic for follow up with his son.   Past Medical History:  Diagnosis Date  . Aortic insufficiency   . Arthritis    "back, knees, right leg" (03/15/2016)  . Atrial fibrillation ([place])   . CAD (coronary artery disease)   . Cardiomyopathy, EF by echo 25-30% 07/14/2013  . CHF (congestive heart failure) ([place])   . Chronic lower back pain   . COPD (chronic obstructive pulmonary disease) ([place])   . Dyslipidemia   . GERD (gastroesophageal reflux disease)   . Gout   . Hypertension   . Hypothyroidism   . Left bundle branch block   . Lung nodules   . Mitral regurgitation   . On home oxygen therapy    "2L; 24/7 for the last week" (03/15/2016)  . Pacemaker   . Pneumonia    "2-3 times" (03/15/2016)  . RBBB   . Shortness of breath   . Shoulder pain    left; limited ROM  . Skin cancer    "left side of my nose; had another one maybe off my back" (03/15/2016)    Past Surgical History:  Procedure Laterality Date  . BACK SURGERY    . BI-VENTRICULAR PACEMAKER INSERTION (CRT-P)  07-15-2013   STJ Quadra Allura CRTP implanted by Dr Caryl Comes for NICM, CHF, alternating bundle branch blocks  . CARDIOVERSION N/A 09/13/2013  Procedure: CARDIOVERSION;  Surgeon: Carlena Bjornstad, MD;  Location: [facility];  Service: Cardiovascular;  Laterality: N/A;  . CARDIOVERSION N/A 10/17/2013   Procedure: CARDIOVERSION;  Surgeon: Lelon Perla, MD;  Location: [facility];  Service: Cardiovascular;  Laterality: N/A;  . CARDIOVERSION N/A 07/04/2016   Procedure: CARDIOVERSION;  Surgeon: Sanda Klein, MD;  Location: [facility];  Service: Cardiovascular;  Laterality: N/A;  . CATARACT EXTRACTION W/ INTRAOCULAR LENS  IMPLANT, BILATERAL Bilateral   . CORONARY ANGIOPLASTY    . INSERT / REPLACE / REMOVE PACEMAKER    . KNEE  ARTHROSCOPY Right   . LUMBAR DISC SURGERY    . PERMANENT PACEMAKER INSERTION N/A 07/15/2013   Procedure: PERMANENT PACEMAKER INSERTION;  Surgeon: Deboraha Sprang, MD;  Location[facility] CATH LAB;  Service: Cardiovascular;  Laterality: N/A;  . SHOULDER ARTHROSCOPY Right   . THROAT SURGERY  ~ 1950   "exploratory; felt like I had something in my throat & they went in to look around"  . TONSILLECTOMY      Current Medications: Current Meds  Medication Sig  . allopurinol (ZYLOPRIM) 100 MG tablet Take 100 mg by mouth daily.  . carvedilol (COREG) 6.25 MG tablet TAKE 1 TABLET (6.25 MG TOTAL) BY MOUTH 2 (TWO) TIMES DAILY WITH A MEAL.  Marland Kitchen ELIQUIS 2.5 MG TABS tablet TAKE 1 TABLET BY MOUTH TWICE A DAY  . fenofibrate (TRICOR) 48 MG tablet Take 0.5 tablets (24 mg total) by mouth daily.  . fish oil-omega-3 fatty acids 1000 MG capsule Take 2 g by mouth daily.   Marland Kitchen gabapentin (NEURONTIN) 300 MG capsule Take 300 mg by mouth 2 (two) times daily.  Marland Kitchen guaiFENesin (MUCINEX) 600 MG 12 hr tablet Take 600 mg by mouth 2 (two) times daily as needed for cough or to loosen phlegm (COLDS).   Marland Kitchen KLOR-CON M20 20 MEQ tablet TAKE 1 TABLET BY MOUTH EVERY DAY  . levothyroxine (SYNTHROID, LEVOTHROID) 75 MCG tablet Take 1 tablet (75 mcg total) by mouth daily.  . Multiple Vitamins-Minerals (OCUVITE PO) Take 2 tablets by mouth daily.   Marland Kitchen omeprazole (PRILOSEC) 20 MG capsule Take 20 mg by mouth daily.   . predniSONE (DELTASONE) 10 MG tablet Take 30 mg by mouth daily.   . tamsulosin (FLOMAX) 0.4 MG CAPS capsule Take 0.4 mg by mouth 2 (two) times daily.   . triazolam (HALCION) 0.25 MG tablet Take 0.5 tablets (0.125 mg total) by mouth at bedtime as needed for sleep.     Allergies:   Patient has no known allergies.   Social History   Social History  . Marital status: Married    Spouse name: N/A  . Number of children: N/A  . Years of education: N/A   Social History Main Topics  . Smoking status: Never Smoker  . Smokeless tobacco:  Never Used  . Alcohol use No  . Drug use: No  . Sexual activity: Not Currently   Other Topics Concern  . None   Social History Narrative   He has never smoked or drank. No illcit drug use . He is retired from the [employer] and lives with his wife. Illicit Drug Use- no     Family History: The patient's family history includes Heart attack in his sister; Lung cancer in his brother. There is no history of Colon cancer. ROS:   Please see the history of present illness.     All other systems reviewed and are negative.  EKGs/Labs/Other Studies Reviewed:    The following  studies were reviewed today:  2D ECHO: 03/17/16  Study Conclusions  - Left ventricle: The cavity size was mildly dilated. Systolic  function was severely reduced. The estimated ejection fraction  was in the range of 20% to 25%. Doppler parameters are consistent  with restrictive physiology, indicative of decreased left  ventricular diastolic compliance and/or increased left atrial  pressure. Doppler parameters are consistent with elevated  ventricular end-diastolic filling pressure.  - Ventricular septum: Septal motion showed paradox.  - Aortic valve: Trileaflet; mildly thickened, mildly calcified  leaflets. There was mild regurgitation.  - Mitral valve: Structurally normal valve. There was moderate  regurgitation.  - Left atrium: The atrium was mildly dilated.  - Right ventricle: Systolic function was normal.  - Right atrium: The atrium was normal in size.  - Tricuspid valve: There was moderate regurgitation.  - Pulmonic valve: Structurally normal valve. There was no  regurgitation.  - Pulmonary arteries: Systolic pressure was mildly increased. PA  peak pressure: 38 mm Hg (S).  - Pericardium, extracardiac: There was no pericardial effusion.  Impressions:  - Severely reduced LVEF with diffuse hypokinesis, paradoxical  septal motion and akinetic apical segments. There is no thrombus.    Restrictive pattern of diastolic dysfunction.     EKG:  EKG is ordered today.  The ekg ordered today demonstrates atrial fibrillation and ventricular pacing  Recent Labs: 03/15/2016: ALT 19; B Natriuretic Peptide 1,097.9; Magnesium 2.3; TSH 2.055 03/19/2016: Hemoglobin 11.8; Platelets 350 08/02/2016: BUN 45; Creatinine, Ser 2.22; Potassium 4.2; Sodium 142   Recent Lipid Panel    Component Value Date/Time   CHOL 166 09/17/2010 1032   TRIG 224.0 (H) 09/17/2010 1032   HDL 37.40 (L) 09/17/2010 1032   CHOLHDL 4 09/17/2010 1032   VLDL 44.8 (H) 09/17/2010 1032   LDLCALC (H) 08/07/2008 0500    113        Total Cholesterol/HDL:CHD Risk Coronary Heart Disease Risk Table                     Men   Women  1/2 Average Risk   3.4   3.3  Average Risk       5.0   4.4  2 X Average Risk   9.6   7.1  3 X Average Risk  23.4   11.0        Use the calculated Patient Ratio above and the CHD Risk Table to determine the patient's CHD Risk.        ATP III CLASSIFICATION (LDL):  <100     mg/dL   Optimal  100-129  mg/dL   Near or Above                    Optimal  130-159  mg/dL   Borderline  160-189  mg/dL   High  >190     mg/dL   Very High   LDLDIRECT 109.6 09/17/2010 1032    Physical Exam:    VS:  BP 110/62   Pulse 84   Ht 5\' 10"  (1.778 m)   Wt 169 lb 1.9 oz (76.7 kg)   BMI 24.27 kg/m     Wt Readings from Last 3 Encounters:  10/24/16 169 lb 1.9 oz (76.7 kg)  08/02/16 174 lb (78.9 kg)  06/28/16 177 lb 9.6 oz (80.6 kg)     GEN: Well nourished, well developed, elderly male in no acute distress HEENT: Normal NECK: No JVD; No carotid bruits LYMPHATICS: No lymphadenopathy CARDIAC: Irregularly  irregular rhythm, no murmurs, rubs, gallops RESPIRATORY:  Clear to auscultation without rales, normal work of breathing ABDOMEN: Soft, non-tender, non-distended MUSCULOSKELETAL:  No edema; No deformity  SKIN: Warm and dry NEUROLOGIC:  Alert and oriented x 3 PSYCHIATRIC:  Normal affect    ASSESSMENT:    1. Chronic systolic CHF (congestive heart failure) (HCC)   2. Persistent atrial fibrillation ([place])   3. Chronic kidney disease, unspecified CKD stage   4. Pacemaker   5. Chronic obstructive pulmonary disease, unspecified COPD type ([place])    PLAN:    In order of problems listed above:  Chronic systolic CHF: Pt had been having trouble with fluid overload. Attempted cardioversion in 05/2016, reverted to atrial fib shortly after. Was changed from lasix to torsemide. Appears euvolemic today and tolerating torsemide, except that had gout flare. Now resolved with prednisone and addition of allopurinol. Pacemaker interrogated with acceptable impedence. Will continue current therapy and follow up with Dr. Marlou Porch in 3 months.    Persistent Atrial fibrillation: Continues in atrial fib after DCCV in 06/2016 with reverting to afib shortly after. He is rate controlled on carvedilol 6.25 mg bid and Eliquis 2.5 mg for stroke risk reduciton. He is tolerating his medications without problems. No unusual bleeding.    CKD: last labs on 4/17 shoed stable kidney funciton and K+ 4.2. Will check BMet today. If K+ on the high side will reduce to 3mEq. Pt has trouble swallowing the KDur. Discussed dissolving the pill in small amt of warm water.   (SCr 2.22 4/17,  K+ 4.2)   SSS s/p PPM: PPM interrogated, functioning normally. Pt in afib.   COPD:   Stable. Managed by PCP.    Medication Adjustments/Labs and Tests Ordered: Current medicines are reviewed at length with the patient today.  Concerns regarding medicines are outlined above. Labs and tests ordered and medication changes are outlined in the patient instructions below:  There are no Patient Instructions on file for this visit.   Signed, Daune Perch, NP  10/24/2016 9:40 AM    [place]

## 2016-10-24 ENCOUNTER — Ambulatory Visit (INDEPENDENT_AMBULATORY_CARE_PROVIDER_SITE_OTHER): Payer: Medicare Other | Admitting: Cardiology

## 2016-10-24 ENCOUNTER — Encounter: Payer: Self-pay | Admitting: Cardiology

## 2016-10-24 VITALS — BP 110/62 | HR 84 | Ht 70.0 in | Wt 169.1 lb

## 2016-10-24 DIAGNOSIS — I481 Persistent atrial fibrillation: Secondary | ICD-10-CM | POA: Diagnosis not present

## 2016-10-24 DIAGNOSIS — Z950 Presence of cardiac pacemaker: Secondary | ICD-10-CM | POA: Diagnosis not present

## 2016-10-24 DIAGNOSIS — I4819 Other persistent atrial fibrillation: Secondary | ICD-10-CM

## 2016-10-24 DIAGNOSIS — N189 Chronic kidney disease, unspecified: Secondary | ICD-10-CM

## 2016-10-24 DIAGNOSIS — I5022 Chronic systolic (congestive) heart failure: Secondary | ICD-10-CM

## 2016-10-24 DIAGNOSIS — I255 Ischemic cardiomyopathy: Secondary | ICD-10-CM

## 2016-10-24 DIAGNOSIS — J449 Chronic obstructive pulmonary disease, unspecified: Secondary | ICD-10-CM | POA: Diagnosis not present

## 2016-10-24 LAB — BASIC METABOLIC PANEL
BUN / CREAT RATIO: 23 (ref 10–24)
BUN: 44 mg/dL — ABNORMAL HIGH (ref 10–36)
CO2: 23 mmol/L (ref 20–29)
CREATININE: 1.94 mg/dL — AB (ref 0.76–1.27)
Calcium: 9.1 mg/dL (ref 8.6–10.2)
Chloride: 101 mmol/L (ref 96–106)
GFR calc Af Amer: 33 mL/min/{1.73_m2} — ABNORMAL LOW (ref >59)
GFR, EST NON AFRICAN AMERICAN: 28 mL/min/{1.73_m2} — AB (ref >59)
GLUCOSE: 175 mg/dL — AB (ref 65–99)
Potassium: 4.4 mmol/L (ref 3.5–5.2)
SODIUM: 142 mmol/L (ref 134–144)

## 2016-10-24 NOTE — Patient Instructions (Addendum)
Medication Instructions:  Your physician recommends that you continue on your current medications as directed. Please refer to the Current Medication list given to you today.   Labwork: TODAY:  BMET  Testing/Procedures: None ordered  Follow-Up: Your physician recommends that you schedule a follow-up appointment in: 3 MONTHS WITH DR. SKAINS   Any Other Special Instructions Will Be Listed Below (If Applicable).     If you need a refill on your cardiac medications before your next appointment, please call your pharmacy.   

## 2016-10-25 ENCOUNTER — Telehealth: Payer: Self-pay | Admitting: *Deleted

## 2016-10-25 NOTE — Telephone Encounter (Signed)
DPR for pt's son Ronalee Belts who has been notified of pt's lab results and findings and to continue on current Tx plan by phone with verbal understanding. No changes to be made with any medications. Pt's son Ronalee Belts verbalized understanding to results and plan of care for the pt as discussed at yesterday visit. Pt's son thanked me for my call today.

## 2016-10-25 NOTE — Telephone Encounter (Signed)
-----   Message from Daune Perch, NP sent at 10/24/2016  4:34 PM EDT ----- Please let pt know that his labs today showed stable kidney function and normal potassium. The blood sugar was high, but this was not fasting so does not mean much. Continue on current medical therapy.  Daune Perch, NP

## 2016-10-29 IMAGING — DX DG CHEST 2V
3 series · 3 of 3 positions shown · non-contrast
Comparison: 10/18/2015, earlier the same day.

CLINICAL DATA: Respiratory distress.

EXAM:
CHEST  2 VIEW

[x chest ap (1 of 2)]
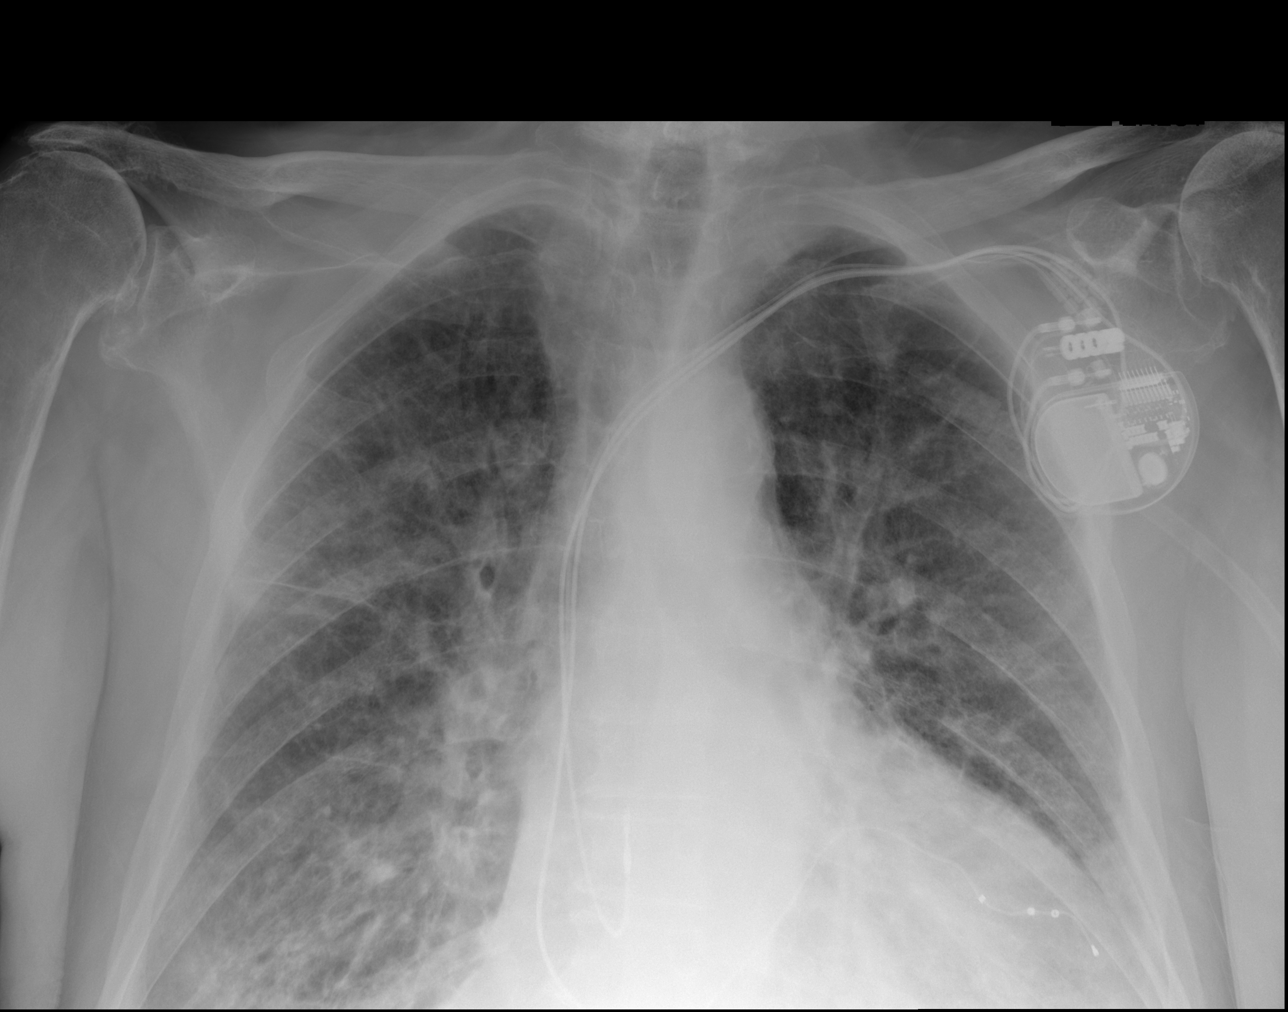

[x chest ap (2 of 2)]
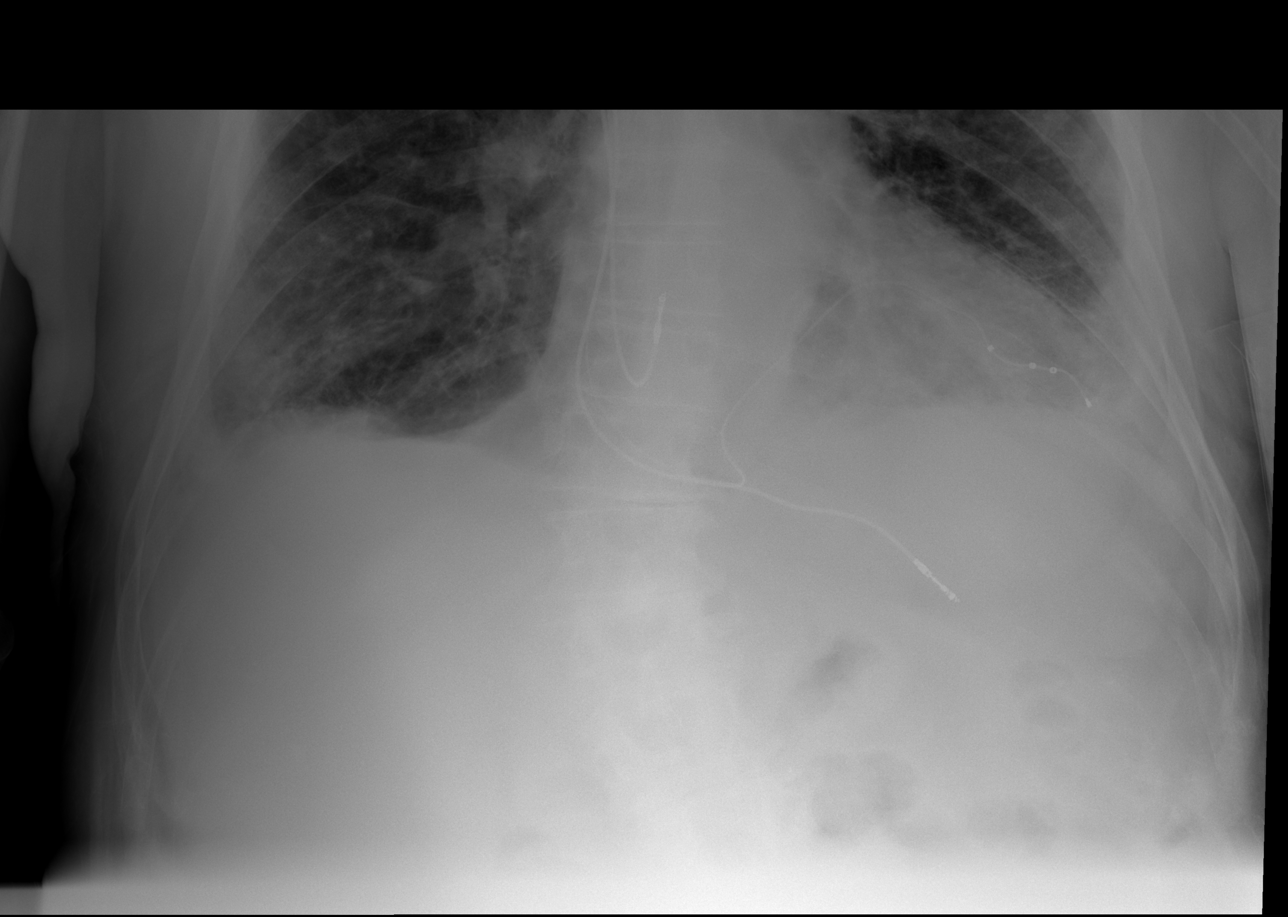

[w chest lat]
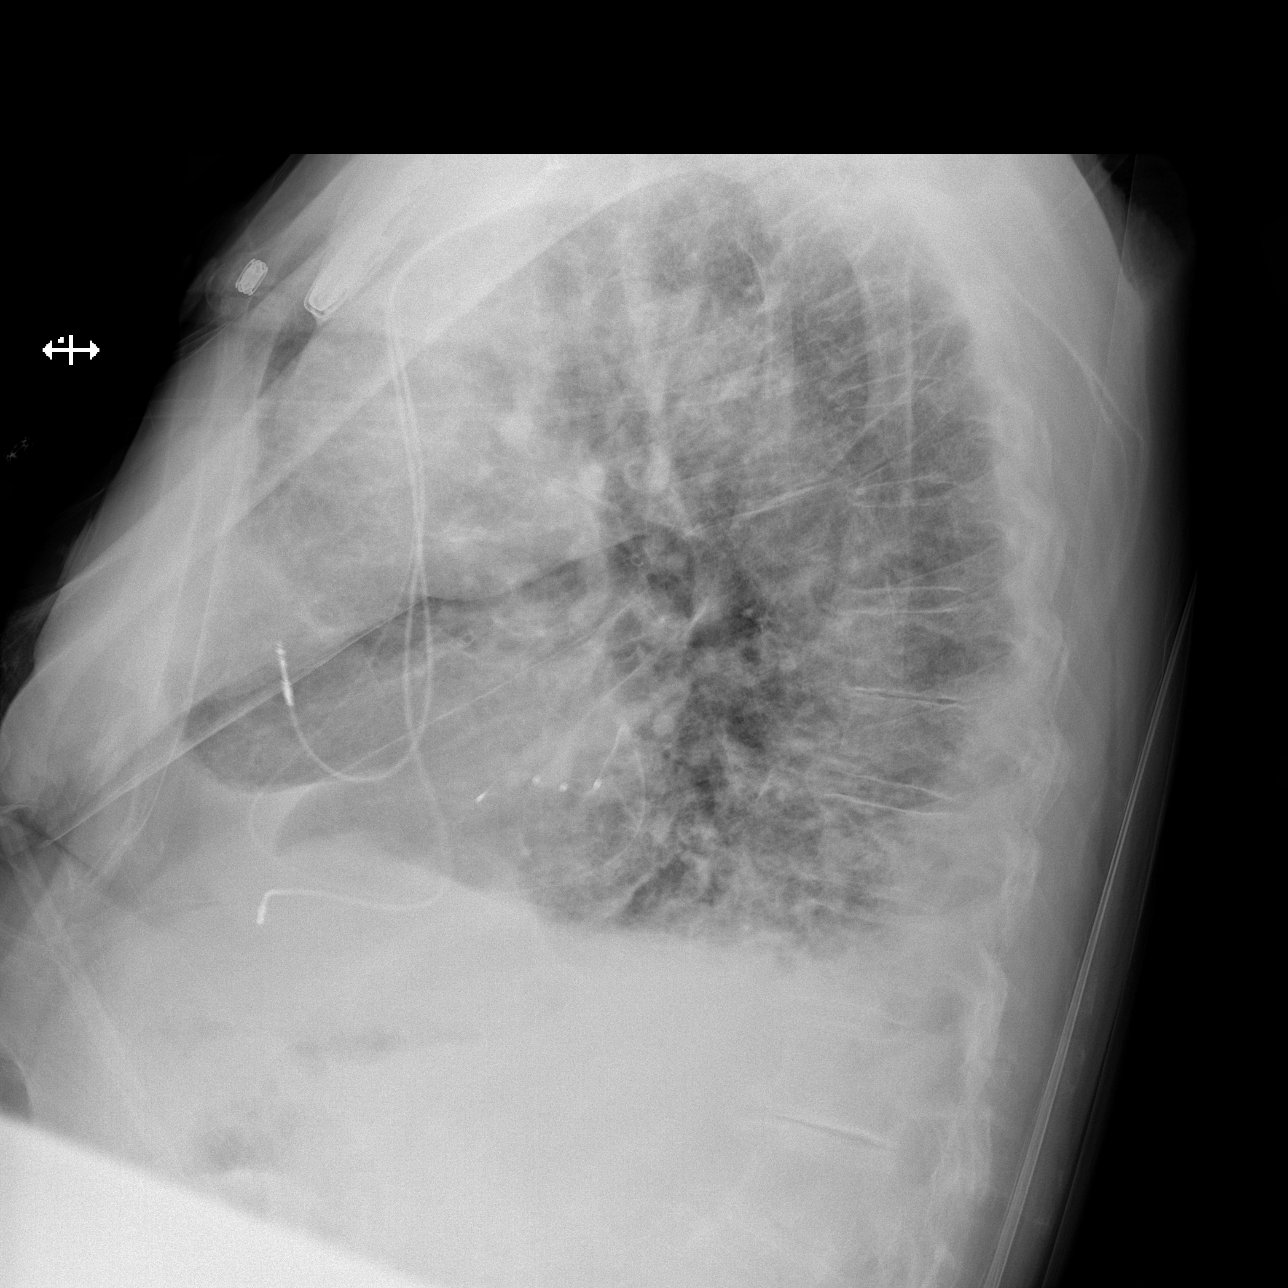

[3 of 3 positions shown; findings below may reference images not displayed]

FINDINGS: Since the previous study there has been increase in vascular
congestion with worsening interstitial opacity suggesting evolving
pulmonary edema. The cardio pericardial silhouette is enlarged.
Left-sided permanent pacemaker remains in place. Degenerative
changes noted in the shoulders.
IMPRESSION: Worsening interstitial and basilar alveolar opacities suggest
progressing pulmonary edema.

## 2016-11-04 ENCOUNTER — Ambulatory Visit (INDEPENDENT_AMBULATORY_CARE_PROVIDER_SITE_OTHER): Payer: Medicare Other

## 2016-11-04 DIAGNOSIS — Z950 Presence of cardiac pacemaker: Secondary | ICD-10-CM

## 2016-11-04 DIAGNOSIS — I5022 Chronic systolic (congestive) heart failure: Secondary | ICD-10-CM

## 2016-11-04 NOTE — Progress Notes (Signed)
EPIC Encounter for ICM Monitoring  Patient Name: Levi Lewis is a 83 y.o. male Date: 11/04/2016 Primary Care Physican: [name], MD Primary Cardiologist:Skains/Thompson, PA Electrophysiologist: Faustino Congress Weight:Last known weight 172lbs Bi-V Pacing: 74%     AT/AF Burden >99%      Spoke with son.  Heart Failure questions reviewed, pt asymptomatic.   Thoracic impedance normal but was above baseline for the last few days.  Patient had a coupe teeth pulled this week and he cut down on fluids.  No dehydration symptoms.   Prescribed dosage:Torsemide 20 mg 2 tablets (40 mg total) by mouth 2 (two) times daily. Potassium 20 mEq 1 tablet daily  Labs: 10/24/2016 Creatinine 1.94, BUN 44, Potassium 4.4, Sodium 142, EGFR 28-33 08/02/2016 Creatinine 2.22, BUN 45, Potassium 4.2, Sodium 142, EGFR 24-28 06/28/2016 Creatinine 1.87, BUN 40, Potassium 4.7, Sodium 144, EGFR 29-34 06/10/2016 Creatinine 1.95, BUN 41, Potassium 4.6, Sodium 142, EGFR 28-32 05/16/2016 Creatinine 1.92, BUN 34, Potassium 4.2, Sodium 142 12/14/2017Creatinine 1.48, BUN 36, Potassium 3.8, Sodium 141 03/20/2016 Creatinine 1.90, BUN 37, Potassium 3.9, Sodium 141, EGFR 28-33  03/19/2016 Creatinine 2.03, BUN 37, Potassium 4.0, Sodium 141, EGFR 26-30  03/18/2016 Creatinine 1.96, BUN 35, Potassium 3.6, Sodium 142, EGFR 27-31  03/17/2016 Creatinine 1.76, BUN 29, Potassium 3.5, Sodium 142, EGFR 31-36 03/16/2016 Creatinine 1.79, BUN 29, Potassium 3.8, Sodium 142, EGFR 30-35  03/15/2016 Creatinine 1.87, BUN 4.1, Potassium 4.1, Sodium 137, EGFR 29-33  12/03/2015 Creatinine 1.70, BUN 36, Potassium 3.9, Sodium 144  10/28/2015 Creatinine 1.59, BUN 38, Potassium 4.7, Sodium 143  10/21/2015 Creatinine 1.73, BUN 28, Potassium 4.3, Sodium 139, EGFR 32-37  Recommendations: No changes.  Advised to drink up to 64 oz day to stay hydrated.  Encouraged to call for fluid symptoms.  Follow-up plan: ICM clinic phone appointment on  12/05/2016.    Copy of ICM check sent to device physician.   3 month ICM trend: 11/04/2016        AT/AF          1 Year ICM trend:      Rosalene Billings, RN 11/04/2016 7:40 AM

## 2016-12-05 ENCOUNTER — Ambulatory Visit (INDEPENDENT_AMBULATORY_CARE_PROVIDER_SITE_OTHER): Payer: Medicare Other

## 2016-12-05 DIAGNOSIS — Z950 Presence of cardiac pacemaker: Secondary | ICD-10-CM | POA: Diagnosis not present

## 2016-12-05 DIAGNOSIS — I5022 Chronic systolic (congestive) heart failure: Secondary | ICD-10-CM

## 2016-12-05 NOTE — Progress Notes (Signed)
EPIC Encounter for ICM Monitoring  Patient Name: Levi Lewis is a 83 y.o. male Date: 12/05/2016 Primary Care Physican: [name], MD Primary Cardiologist:Skains/Thompson, PA Electrophysiologist: Faustino Congress Weight:unknown Bi-V Pacing: 75% AT/AF Burden >99%           Spoke with son.  Heart Failure questions reviewed, pt asymptomatic.   Thoracic impedance normal but was abnormal suggesting fluid accumulation from 11/11/2016 to 11/27/2016 (16 days).  Prescribed dosage: Torsemide 20 mg 2 tablets (40 mg total) by mouth 2 (two) times daily. Potassium 20 mEq 1 tablet daily  Labs: 10/24/2016 Creatinine 1.94, BUN 44, Potassium 4.4, Sodium 142, EGFR 28-33 08/02/2016 Creatinine 2.22, BUN 45, Potassium 4.2, Sodium 142, EGFR 24-28 06/28/2016 Creatinine 1.87, BUN 40, Potassium 4.7, Sodium 144, EGFR 29-34 06/10/2016 Creatinine 1.95, BUN 41, Potassium 4.6, Sodium 142, EGFR 28-32 05/16/2016 Creatinine 1.92, BUN 34, Potassium 4.2, Sodium 142 12/14/2017Creatinine 1.48, BUN 36, Potassium 3.8, Sodium 141 03/20/2016 Creatinine 1.90, BUN 37, Potassium 3.9, Sodium 141, EGFR 28-33  03/19/2016 Creatinine 2.03, BUN 37, Potassium 4.0, Sodium 141, EGFR 26-30  03/18/2016 Creatinine 1.96, BUN 35, Potassium 3.6, Sodium 142, EGFR 27-31  03/17/2016 Creatinine 1.76, BUN 29, Potassium 3.5, Sodium 142, EGFR 31-36 03/16/2016 Creatinine 1.79, BUN 29, Potassium 3.8, Sodium 142, EGFR 30-35  03/15/2016 Creatinine 1.87, BUN 4.1, Potassium 4.1, Sodium 137,EGFR 29-33  12/03/2015 Creatinine 1.70, BUN 36, Potassium 3.9, Sodium 144  10/28/2015 Creatinine 1.59, BUN 38, Potassium 4.7, Sodium 143  10/21/2015 Creatinine 1.73, BUN 28, Potassium 4.3, Sodium 139, EGFR 32-37  Recommendations: No changes.   Encouraged to call for fluid symptoms.  Follow-up plan: ICM clinic phone appointment on 01/05/2017.  Office appointment scheduled 02/07/2017 with Dr. Marlou Porch.  Copy of ICM check sent to device physician.   3 month  ICM trend: 12/05/2016   AT/AF   1 Year ICM trend:      Rosalene Billings, RN 12/05/2016 4:54 PM

## 2016-12-07 NOTE — Addendum Note (Signed)
Addendum  created 12/07/16 1236 by Albertha Ghee, MD   Sign clinical note

## 2016-12-18 ENCOUNTER — Other Ambulatory Visit: Payer: Self-pay | Admitting: Cardiology

## 2017-01-05 ENCOUNTER — Ambulatory Visit (INDEPENDENT_AMBULATORY_CARE_PROVIDER_SITE_OTHER): Payer: Medicare Other | Admitting: *Deleted

## 2017-01-05 DIAGNOSIS — I5022 Chronic systolic (congestive) heart failure: Secondary | ICD-10-CM | POA: Diagnosis not present

## 2017-01-05 DIAGNOSIS — I495 Sick sinus syndrome: Secondary | ICD-10-CM

## 2017-01-05 DIAGNOSIS — Z950 Presence of cardiac pacemaker: Secondary | ICD-10-CM | POA: Diagnosis not present

## 2017-01-05 NOTE — Progress Notes (Signed)
EPIC Encounter for ICM Monitoring  Patient Name: Levi Lewis is a 82 y.o. male Date: 01/05/2017 Primary Care Physican: [name], MD Electrophysiologist: Caryl Comes Dry Weight:172 lbs Bi-V Pacing: 75% AT/AF Burden >99%        Spoke with son. Heart Failure questions reviewed, pt asymptomatic.   Thoracic impedance just below baseline normal.  Prescribed dosage: Torsemide 20 mg 2 tablets (40 mg total) by mouth 2 (two) times daily. Potassium 20 mEq 1 tablet daily  Labs: 10/24/2016 Creatinine 1.94, BUN 44, Potassium 4.4, Sodium 142, EGFR 28-33 08/02/2016 Creatinine 2.22, BUN 45, Potassium 4.2, Sodium 142, EGFR 24-28 06/28/2016 Creatinine 1.87, BUN 40, Potassium 4.7, Sodium 144, EGFR 29-34 06/10/2016 Creatinine 1.95, BUN 41, Potassium 4.6, Sodium 142, EGFR 28-32 05/16/2016 Creatinine 1.92, BUN 34, Potassium 4.2, Sodium 142 12/14/2017Creatinine 1.48, BUN 36, Potassium 3.8, Sodium 141 03/20/2016 Creatinine 1.90, BUN 37, Potassium 3.9, Sodium 141, EGFR 28-33  03/19/2016 Creatinine 2.03, BUN 37, Potassium 4.0, Sodium 141, EGFR 26-30  03/18/2016 Creatinine 1.96, BUN 35, Potassium 3.6, Sodium 142, EGFR 27-31  03/17/2016 Creatinine 1.76, BUN 29, Potassium 3.5, Sodium 142, EGFR 31-36 03/16/2016 Creatinine 1.79, BUN 29, Potassium 3.8, Sodium 142, EGFR 30-35  03/15/2016 Creatinine 1.87, BUN 4.1, Potassium 4.1, Sodium 137,EGFR 29-33  12/03/2015 Creatinine 1.70, BUN 36, Potassium 3.9, Sodium 144  10/28/2015 Creatinine 1.59, BUN 38, Potassium 4.7, Sodium 143  10/21/2015 Creatinine 1.73, BUN 28, Potassium 4.3, Sodium 139, EGFR 32-37  Recommendations: No changes.  Advised to limit salt intake to 2000 mg/day since it appears he may be retaining small amount of fluid.  Encouraged to call for fluid symptoms.  Follow-up plan: ICM clinic phone appointment on 02/06/2017.  Office appointment scheduled 02/07/2017 with Dr. Marlou Porch.  Copy of ICM check sent to Dr. Caryl Comes.   3 month ICM trend:  01/05/2017   AT/AF     1 Year ICM trend:      Rosalene Billings, RN 01/05/2017 10:40 AM

## 2017-01-05 NOTE — Progress Notes (Signed)
Remote pacemaker transmission.   

## 2017-01-06 ENCOUNTER — Encounter: Payer: Self-pay | Admitting: Cardiology

## 2017-01-09 LAB — CUP PACEART REMOTE DEVICE CHECK
Brady Statistic AP VP Percent: 42 %
Brady Statistic AP VS Percent: 7.2 %
Brady Statistic AS VP Percent: 39 %
Brady Statistic AS VS Percent: 6.2 %
Brady Statistic RA Percent Paced: 11 %
Date Time Interrogation Session: 20180920060018
Implantable Lead Implant Date: 20150330
Implantable Lead Implant Date: 20150330
Implantable Lead Implant Date: 20150330
Implantable Lead Location: 753859
Implantable Lead Location: 753860
Lead Channel Impedance Value: 490 Ohm
Lead Channel Pacing Threshold Amplitude: 0.75 V
Lead Channel Pacing Threshold Amplitude: 1 V
Lead Channel Pacing Threshold Amplitude: 1 V
Lead Channel Pacing Threshold Pulse Width: 0.4 ms
Lead Channel Pacing Threshold Pulse Width: 0.4 ms
Lead Channel Pacing Threshold Pulse Width: 0.6 ms
Lead Channel Sensing Intrinsic Amplitude: 8.5 mV
Lead Channel Setting Pacing Amplitude: 1.5 V
Lead Channel Setting Pacing Pulse Width: 0.4 ms
Lead Channel Setting Sensing Sensitivity: 2 mV
MDC IDC LEAD LOCATION: 753858
MDC IDC MSMT BATTERY REMAINING LONGEVITY: 85 mo
MDC IDC MSMT BATTERY REMAINING PERCENTAGE: 95.5 %
MDC IDC MSMT BATTERY VOLTAGE: 2.96 V
MDC IDC MSMT LEADCHNL RA IMPEDANCE VALUE: 410 Ohm
MDC IDC MSMT LEADCHNL RA SENSING INTR AMPL: 0.2 mV
MDC IDC MSMT LEADCHNL RV IMPEDANCE VALUE: 390 Ohm
MDC IDC PG IMPLANT DT: 20150330
MDC IDC PG SERIAL: 2978403
MDC IDC SET LEADCHNL LV PACING PULSEWIDTH: 0.4 ms
MDC IDC SET LEADCHNL RA PACING AMPLITUDE: 2.5 V
MDC IDC SET LEADCHNL RV PACING AMPLITUDE: 2.5 V

## 2017-02-02 ENCOUNTER — Other Ambulatory Visit: Payer: Self-pay | Admitting: Cardiology

## 2017-02-06 ENCOUNTER — Ambulatory Visit (INDEPENDENT_AMBULATORY_CARE_PROVIDER_SITE_OTHER): Payer: Medicare Other

## 2017-02-06 DIAGNOSIS — Z950 Presence of cardiac pacemaker: Secondary | ICD-10-CM

## 2017-02-06 DIAGNOSIS — I5022 Chronic systolic (congestive) heart failure: Secondary | ICD-10-CM | POA: Diagnosis not present

## 2017-02-06 NOTE — Progress Notes (Signed)
EPIC Encounter for ICM Monitoring  Patient Name: Levi Lewis is a 83 y.o. male Date: 02/06/2017 Primary Care Physican: [name], MD Primary Cardiologist:Skains/Thompson, PA Electrophysiologist: Faustino Congress Weight: Last weight 172 lbs Bi-V Pacing: 75% AT/AF Burden >99%        Attempted call to son.  Left detailed message regarding transmission.  Transmission reviewed.    Thoracic impedance normal.  Prescribed dosage: Torsemide 20 mg 2 tablets (40 mg total) by mouth 2 (two) times daily. Potassium 20 mEq 1 tablet daily  Labs: 10/24/2016 Creatinine 1.94, BUN 44, Potassium 4.4, Sodium 142, EGFR 28-33 08/02/2016 Creatinine 2.22, BUN 45, Potassium 4.2, Sodium 142, EGFR 24-28 06/28/2016 Creatinine 1.87, BUN 40, Potassium 4.7, Sodium 144, EGFR 29-34 06/10/2016 Creatinine 1.95, BUN 41, Potassium 4.6, Sodium 142, EGFR 28-32 05/16/2016 Creatinine 1.92, BUN 34, Potassium 4.2, Sodium 142 12/14/2017Creatinine 1.48, BUN 36, Potassium 3.8, Sodium 141 03/20/2016 Creatinine 1.90, BUN 37, Potassium 3.9, Sodium 141, EGFR 28-33  03/19/2016 Creatinine 2.03, BUN 37, Potassium 4.0, Sodium 141, EGFR 26-30  03/18/2016 Creatinine 1.96, BUN 35, Potassium 3.6, Sodium 142, EGFR 27-31  03/17/2016 Creatinine 1.76, BUN 29, Potassium 3.5, Sodium 142, EGFR 31-36 03/16/2016 Creatinine 1.79, BUN 29, Potassium 3.8, Sodium 142, EGFR 30-35  03/15/2016 Creatinine 1.87, BUN 4.1, Potassium 4.1, Sodium 137,EGFR 29-33  12/03/2015 Creatinine 1.70, BUN 36, Potassium 3.9, Sodium 144  10/28/2015 Creatinine 1.59, BUN 38, Potassium 4.7, Sodium 143  10/21/2015 Creatinine 1.73, BUN 28, Potassium 4.3, Sodium 139, EGFR 32-37  Recommendations: Left voice mail with ICM number and encouraged to call if experiencing any fluid symptoms.   Follow-up plan: ICM clinic phone appointment on 03/13/2017. Office appointment scheduled 02/07/2017 with Dr. Marlou Porch.  Copy of ICM check sent to Dr. Marlou Porch and Dr. Caryl Comes.   3 month  ICM trend: 02/06/2017   1 Year ICM trend:      Rosalene Billings, RN 02/06/2017 3:17 PM

## 2017-02-07 ENCOUNTER — Encounter: Payer: Self-pay | Admitting: Cardiology

## 2017-02-07 ENCOUNTER — Ambulatory Visit (INDEPENDENT_AMBULATORY_CARE_PROVIDER_SITE_OTHER): Payer: Medicare Other | Admitting: Cardiology

## 2017-02-07 VITALS — BP 120/56 | HR 70 | Ht 70.0 in | Wt 177.6 lb

## 2017-02-07 DIAGNOSIS — I2510 Atherosclerotic heart disease of native coronary artery without angina pectoris: Secondary | ICD-10-CM | POA: Diagnosis not present

## 2017-02-07 DIAGNOSIS — I5023 Acute on chronic systolic (congestive) heart failure: Secondary | ICD-10-CM | POA: Diagnosis not present

## 2017-02-07 DIAGNOSIS — I481 Persistent atrial fibrillation: Secondary | ICD-10-CM

## 2017-02-07 DIAGNOSIS — I4819 Other persistent atrial fibrillation: Secondary | ICD-10-CM

## 2017-02-07 DIAGNOSIS — I5022 Chronic systolic (congestive) heart failure: Secondary | ICD-10-CM

## 2017-02-07 DIAGNOSIS — I255 Ischemic cardiomyopathy: Secondary | ICD-10-CM

## 2017-02-07 DIAGNOSIS — Z950 Presence of cardiac pacemaker: Secondary | ICD-10-CM

## 2017-02-07 DIAGNOSIS — I10 Essential (primary) hypertension: Secondary | ICD-10-CM

## 2017-02-07 LAB — CBC WITH DIFFERENTIAL/PLATELET
Basophils Absolute: 0 10*3/uL (ref 0.0–0.2)
Basos: 0 %
EOS (ABSOLUTE): 0.1 10*3/uL (ref 0.0–0.4)
EOS: 2 %
HEMOGLOBIN: 12.2 g/dL — AB (ref 13.0–17.7)
Hematocrit: 36.5 % — ABNORMAL LOW (ref 37.5–51.0)
IMMATURE GRANULOCYTES: 0 %
Immature Grans (Abs): 0 10*3/uL (ref 0.0–0.1)
Lymphocytes Absolute: 1.5 10*3/uL (ref 0.7–3.1)
Lymphs: 16 %
MCH: 31.9 pg (ref 26.6–33.0)
MCHC: 33.4 g/dL (ref 31.5–35.7)
MCV: 96 fL (ref 79–97)
MONOCYTES: 9 %
Monocytes Absolute: 0.8 10*3/uL (ref 0.1–0.9)
NEUTROS PCT: 73 %
Neutrophils Absolute: 6.6 10*3/uL (ref 1.4–7.0)
Platelets: 238 10*3/uL (ref 150–379)
RBC: 3.82 x10E6/uL — AB (ref 4.14–5.80)
RDW: 14.8 % (ref 12.3–15.4)
WBC: 9 10*3/uL (ref 3.4–10.8)

## 2017-02-07 LAB — BASIC METABOLIC PANEL
BUN/Creatinine Ratio: 23 (ref 10–24)
BUN: 42 mg/dL — ABNORMAL HIGH (ref 10–36)
CALCIUM: 9 mg/dL (ref 8.6–10.2)
CHLORIDE: 103 mmol/L (ref 96–106)
CO2: 24 mmol/L (ref 20–29)
Creatinine, Ser: 1.86 mg/dL — ABNORMAL HIGH (ref 0.76–1.27)
GFR calc non Af Amer: 30 mL/min/{1.73_m2} — ABNORMAL LOW (ref >59)
GFR, EST AFRICAN AMERICAN: 34 mL/min/{1.73_m2} — AB (ref >59)
GLUCOSE: 127 mg/dL — AB (ref 65–99)
POTASSIUM: 4.3 mmol/L (ref 3.5–5.2)
Sodium: 143 mmol/L (ref 134–144)

## 2017-02-07 NOTE — Progress Notes (Signed)
Cardiology Office Note    Date:  02/07/2017   ID:  Levi Lewis, DOB 1918/02/03, MRN [id number]  PCP:  [name], MD  Cardiologist:   Candee Furbish, MD     History of Present Illness:  Levi Lewis is a 83 y.o. male with Bi-V pacemaker Dr. Caryl Comes, persistent atrial fibriallation despite cardioversion attempt 06/2016 with CAD, dilated cardiomyopathy, EF 25% here for follow up.   He had a cardioversion on 06/28/16 however unable to maintain sinus rhythm.  Atrial fibrillation has been permanent via pacemaker interrogation. AFIB feels it in the morning. BP cuff, irreg.   Overall he has been feeling fairly well.  Moderate dyspnea with minimal exertion. No chest pain, no syncope. He does not like taking the potassium 20 mEq because of the texture of the tablets.  Prior office notes reviewed.   Past Medical History:  Diagnosis Date  . Aortic insufficiency   . Arthritis    "back, knees, right leg" (03/15/2016)  . Atrial fibrillation ([place])   . CAD (coronary artery disease)   . Cardiomyopathy, EF by echo 25-30% 07/14/2013  . CHF (congestive heart failure) ([place])   . Chronic lower back pain   . COPD (chronic obstructive pulmonary disease) ([place])   . Dyslipidemia   . GERD (gastroesophageal reflux disease)   . Gout   . Hypertension   . Hypothyroidism   . Left bundle branch block   . Lung nodules   . Mitral regurgitation   . On home oxygen therapy    "2L; 24/7 for the last week" (03/15/2016)  . Pacemaker   . Pneumonia    "2-3 times" (03/15/2016)  . RBBB   . Shortness of breath   . Shoulder pain    left; limited ROM  . Skin cancer    "left side of my nose; had another one maybe off my back" (03/15/2016)    Past Surgical History:  Procedure Laterality Date  . BACK SURGERY    . BI-VENTRICULAR PACEMAKER INSERTION (CRT-P)  07-15-2013   STJ Quadra Allura CRTP implanted by Dr Caryl Comes for NICM, CHF, alternating bundle branch blocks  . CARDIOVERSION N/A 09/13/2013   Procedure:  CARDIOVERSION;  Surgeon: Carlena Bjornstad, MD;  Location: [facility] ENDOSCOPY;  Service: Cardiovascular;  Laterality: N/A;  . CARDIOVERSION N/A 10/17/2013   Procedure: CARDIOVERSION;  Surgeon: Lelon Perla, MD;  Location: [facility];  Service: Cardiovascular;  Laterality: N/A;  . CARDIOVERSION N/A 07/04/2016   Procedure: CARDIOVERSION;  Surgeon: Sanda Klein, MD;  Location: [facility] ENDOSCOPY;  Service: Cardiovascular;  Laterality: N/A;  . CATARACT EXTRACTION W/ INTRAOCULAR LENS  IMPLANT, BILATERAL Bilateral   . CORONARY ANGIOPLASTY    . INSERT / REPLACE / REMOVE PACEMAKER    . KNEE ARTHROSCOPY Right   . LUMBAR DISC SURGERY    . PERMANENT PACEMAKER INSERTION N/A 07/15/2013   Procedure: PERMANENT PACEMAKER INSERTION;  Surgeon: Deboraha Sprang, MD;  Location: [facility] CATH LAB;  Service: Cardiovascular;  Laterality: N/A;  . SHOULDER ARTHROSCOPY Right   . THROAT SURGERY  ~ 1950   "exploratory; felt like I had something in my throat & they went in to look around"  . TONSILLECTOMY      Current Medications: Outpatient Medications Prior to Visit  Medication Sig Dispense Refill  . allopurinol (ZYLOPRIM) 100 MG tablet Take 100 mg by mouth daily.    . carvedilol (COREG) 6.25 MG tablet TAKE 1 TABLET (6.25 MG TOTAL) BY MOUTH 2 (TWO) TIMES DAILY WITH A MEAL. [place]  tablet 5  . fenofibrate (TRICOR) 48 MG tablet TAKE 1/2 TABLET BY MOUTH EVERY DAY 45 tablet 2  . fish oil-omega-3 fatty acids 1000 MG capsule Take 2 g by mouth daily.     Marland Kitchen gabapentin (NEURONTIN) 300 MG capsule Take 300 mg by mouth 2 (two) times daily.  12  . guaiFENesin (MUCINEX) 600 MG 12 hr tablet Take 600 mg by mouth 2 (two) times daily as needed for cough or to loosen phlegm (COLDS).     Marland Kitchen KLOR-CON M20 20 MEQ tablet TAKE 1 TABLET BY MOUTH EVERY DAY 30 tablet 11  . levothyroxine (SYNTHROID, LEVOTHROID) 75 MCG tablet Take 1 tablet (75 mcg total) by mouth daily. 30 tablet 6  . Multiple Vitamins-Minerals (OCUVITE PO) Take 2 tablets by mouth daily.     Marland Kitchen  omeprazole (PRILOSEC) 20 MG capsule Take 20 mg by mouth daily.     . predniSONE (DELTASONE) 10 MG tablet Take 10 mg by mouth daily.   12  . tamsulosin (FLOMAX) 0.4 MG CAPS capsule Take 0.4 mg by mouth 2 (two) times daily.     . triazolam (HALCION) 0.25 MG tablet Take 0.5 tablets (0.125 mg total) by mouth at bedtime as needed for sleep. 15 tablet 0  . ELIQUIS 2.5 MG TABS tablet TAKE 1 TABLET TWICE A DAY (Patient not taking: Reported on 02/07/2017) 180 tablet 1  . torsemide (DEMADEX) 20 MG tablet Take 2 tablets (40 mg total) by mouth 2 (two) times daily. 360 tablet 3   No facility-administered medications prior to visit.      Allergies:   Patient has no known allergies.   Social History   Social History  . Marital status: Married    Spouse name: N/A  . Number of children: N/A  . Years of education: N/A   Social History Main Topics  . Smoking status: Never Smoker  . Smokeless tobacco: Never Used  . Alcohol use No  . Drug use: No  . Sexual activity: Not Currently   Other Topics Concern  . None   Social History Narrative   He has never smoked or drank. No illcit drug use . He is retired from the [employer] and lives with his wife. Illicit Drug Use- no     Family History:  The patient's family history includes Heart attack in his sister; Lung cancer in his brother.   ROS:   Please see the history of present illness.   No bleeding, no syncope, no orthopnea, no PND.  Positive for moderate shortness of breath with minimal activity.  Baseline. ROS All other systems reviewed and are negative.   PHYSICAL EXAM:   VS:  BP (!) 120/56   Pulse 70   Ht 5\' 10"  (1.778 m)   Wt 177 lb 9.6 oz (80.6 kg)   BMI 25.48 kg/m    GEN: Elderly, in no acute distress  HEENT: normal  Neck: no JVD, carotid bruits, or masses Cardiac: Irreg irreg; no murmurs, rubs, or gallops,no edema  Respiratory:  clear to auscultation bilaterally, normal work of breathing GI: soft, nontender, nondistended, +  BS MS: no deformity or atrophy  Skin: warm and dry, no rash Neuro:  Alert and Oriented x 3, Strength and sensation are intact Psych: euthymic mood, full affect   Wt Readings from Last 3 Encounters:  02/07/17 177 lb 9.6 oz (80.6 kg)  10/24/16 169 lb 1.9 oz (76.7 kg)  08/02/16 174 lb (78.9 kg)      Studies/Labs Reviewed:  EKG:  None today  Recent Labs: 03/15/2016: ALT 19; B Natriuretic Peptide 1,097.9; Magnesium 2.3; TSH 2.055 03/19/2016: Hemoglobin 11.8; Platelets 350 10/24/2016: BUN 44; Creatinine, Ser 1.94; Potassium 4.4; Sodium 142   Lipid Panel    Component Value Date/Time   CHOL 166 09/17/2010 1032   TRIG 224.0 (H) 09/17/2010 1032   HDL 37.40 (L) 09/17/2010 1032   CHOLHDL 4 09/17/2010 1032   VLDL 44.8 (H) 09/17/2010 1032   LDLCALC (H) 08/07/2008 0500    113        Total Cholesterol/HDL:CHD Risk Coronary Heart Disease Risk Table                     Men   Women  1/2 Average Risk   3.4   3.3  Average Risk       5.0   4.4  2 X Average Risk   9.6   7.1  3 X Average Risk  23.4   11.0        Use the calculated Patient Ratio above and the CHD Risk Table to determine the patient's CHD Risk.        ATP III CLASSIFICATION (LDL):  <100     mg/dL   Optimal  100-129  mg/dL   Near or Above                    Optimal  130-159  mg/dL   Borderline  160-189  mg/dL   High  >190     mg/dL   Very High   LDLDIRECT 109.6 09/17/2010 1032    Additional studies/ records that were reviewed today include:  Biventricular pacer transmission reviewed., Lab work reviewed, prior office notes reviewed, correspondence with Dr. Caryl Comes    ASSESSMENT:    1. Coronary artery disease involving native coronary artery of native heart without angina pectoris   2. Hypertension, unspecified type   3. Acute on chronic systolic CHF (congestive heart failure) ([place])   4. Chronic systolic CHF (congestive heart failure) ([facility])   5. Persistent atrial fibrillation ([place])   6. Biventricular cardiac  pacemaker in situ      PLAN:  In order of problems listed above:  Persistent atrial fibrillation  - Was unable to maintain sinus rhythm after cardioversion  - Biventricular pacing 70% of the time.  - Anticoagulation with Eliquis  - Has not had a nosebleed in a while.  Seems quite stable.  Chronic systolic heart failure  - EF 25%, NYHA class II-III, occasional shortness of breath with minimal activity, utilizes walker.-No orthopnea  - On carvedilol 6.25 mg twice a day. Unable to utilize ACE inhibitor because of chronic kidney disease stage IV.  - Does not appear to be overloaded on exam today. Pacemaker transmission would agree.  No edema lower extremities.  Occasional pedal edema he states.  - Continue to advocate movement  - Weight is stable.  - Torsemide seems to be working well.  - Does not like swallowing potassium pills.  -Checking basic metabolic profile.  Coronary artery disease  - No anginal symptoms.  We will have him come back in in 4 months. APP  Medication Adjustments/Labs and Tests Ordered: Current medicines are reviewed at length with the patient today.  Concerns regarding medicines are outlined above.  Medication changes, Labs and Tests ordered today are listed in the Patient Instructions below. Patient Instructions  Your physician recommends that you continue on your current medications as directed. Please refer to the Current  Medication list given to you today.   Your physician recommends that you return for lab work in: [place] physician recommends that you schedule a follow-up appointment in:  [place] NP     Signed, Candee Furbish, MD  02/07/2017 8:45 AM    [address], [place], [place]  [address] Phone: [phone]; Fax: [phone]

## 2017-02-07 NOTE — Patient Instructions (Signed)
Your physician recommends that you continue on your current medications as directed. Please refer to the Current Medication list given to you today.   Your physician recommends that you return for lab work in: [place] physician recommends that you schedule a follow-up appointment in:  [place] NP

## 2017-02-12 ENCOUNTER — Other Ambulatory Visit: Payer: Self-pay | Admitting: Cardiology

## 2017-02-13 NOTE — Telephone Encounter (Signed)
REFILL 

## 2017-03-13 ENCOUNTER — Ambulatory Visit (INDEPENDENT_AMBULATORY_CARE_PROVIDER_SITE_OTHER): Payer: Medicare Other

## 2017-03-13 ENCOUNTER — Telehealth: Payer: Self-pay | Admitting: Cardiology

## 2017-03-13 DIAGNOSIS — I5022 Chronic systolic (congestive) heart failure: Secondary | ICD-10-CM

## 2017-03-13 DIAGNOSIS — Z950 Presence of cardiac pacemaker: Secondary | ICD-10-CM | POA: Diagnosis not present

## 2017-03-13 NOTE — Progress Notes (Signed)
EPIC Encounter for ICM Monitoring  Patient Name: Levi Lewis is a 83 y.o. male Date: 03/13/2017 Primary Care Physican: [name], MD Primary Cardiologist:Skains/Thompson, PA Electrophysiologist: Caryl Comes Dry Weight: 173 lbs Bi-V Pacing: 74% AT/AF Burden >99%       Spoke with son.  Heart Failure questions reviewed, pt asymptomatic.   Thoracic impedance normal.  Prescribed dosage: Torsemide 20 mg 2 tablets (40 mg total) by mouth 2 (two) times daily. Potassium 20 mEq 1 tablet daily  Labs: 10/24/2016 Creatinine 1.94, BUN 44, Potassium 4.4, Sodium 142, EGFR 28-33 08/02/2016 Creatinine 2.22, BUN 45, Potassium 4.2, Sodium 142, EGFR 24-28 06/28/2016 Creatinine 1.87, BUN 40, Potassium 4.7, Sodium 144, EGFR 29-34 06/10/2016 Creatinine 1.95, BUN 41, Potassium 4.6, Sodium 142, EGFR 28-32 05/16/2016 Creatinine 1.92, BUN 34, Potassium 4.2, Sodium 142 12/14/2017Creatinine 1.48, BUN 36, Potassium 3.8, Sodium 141 03/20/2016 Creatinine 1.90, BUN 37, Potassium 3.9, Sodium 141, EGFR 28-33  03/19/2016 Creatinine 2.03, BUN 37, Potassium 4.0, Sodium 141, EGFR 26-30  03/18/2016 Creatinine 1.96, BUN 35, Potassium 3.6, Sodium 142, EGFR 27-31  03/17/2016 Creatinine 1.76, BUN 29, Potassium 3.5, Sodium 142, EGFR 31-36 03/16/2016 Creatinine 1.79, BUN 29, Potassium 3.8, Sodium 142, EGFR 30-35  03/15/2016 Creatinine 1.87, BUN 4.1, Potassium 4.1, Sodium 137,EGFR 29-33  12/03/2015 Creatinine 1.70, BUN 36, Potassium 3.9, Sodium 144  10/28/2015 Creatinine 1.59, BUN 38, Potassium 4.7, Sodium 143  10/21/2015 Creatinine 1.73, BUN 28, Potassium 4.3, Sodium 139, EGFR 32-37  Recommendations: No changes.   Encouraged to call for fluid symptoms.  Follow-up plan: ICM clinic phone appointment on 04/13/2017.    Copy of ICM check sent to Dr. Caryl Comes.   3 month ICM trend: 03/13/2017    1 Year ICM trend:       Rosalene Billings, RN 03/13/2017 2:02 PM

## 2017-03-13 NOTE — Telephone Encounter (Signed)
Confirmed remote transmission w/ pt son.    

## 2017-04-13 ENCOUNTER — Ambulatory Visit (INDEPENDENT_AMBULATORY_CARE_PROVIDER_SITE_OTHER): Payer: Medicare Other | Admitting: *Deleted

## 2017-04-13 DIAGNOSIS — I495 Sick sinus syndrome: Secondary | ICD-10-CM

## 2017-04-13 DIAGNOSIS — Z950 Presence of cardiac pacemaker: Secondary | ICD-10-CM

## 2017-04-13 DIAGNOSIS — I5022 Chronic systolic (congestive) heart failure: Secondary | ICD-10-CM

## 2017-04-13 NOTE — Progress Notes (Signed)
Remote pacemaker transmission.   

## 2017-04-13 NOTE — Progress Notes (Signed)
EPIC Encounter for ICM Monitoring  Patient Name: Levi Lewis is a 83 y.o. male Date: 04/13/2017 Primary Care Physican: Green, Edwin, MD Primary Cardiologist:Skains/Thompson, PA Electrophysiologist: Klein Dry Weight: 171 lbs Bi-V Pacing: 75% AT/AF Burden >99%                                                Spoke with son.   Heart Failure questions reviewed, pt asymptomatic.   Thoracic impedance normal.  Prescribed dosage: Torsemide 20 mg 2 tablets (40 mg total) by mouth 2 (two) times daily. Potassium 20 mEq 1 tablet daily  Labs: 02/07/2017 Creatinine 1.86, BUN 42, Potassium 4.3, Sodium 143, EGFR 30-34 10/24/2016 Creatinine 1.94, BUN 44, Potassium 4.4, Sodium 142, EGFR 28-33 08/02/2016 Creatinine 2.22, BUN 45, Potassium 4.2, Sodium 142, EGFR 24-28 06/28/2016 Creatinine 1.87, BUN 40, Potassium 4.7, Sodium 144, EGFR 29-34 06/10/2016 Creatinine 1.95, BUN 41, Potassium 4.6, Sodium 142, EGFR 28-32 05/16/2016 Creatinine 1.92, BUN 34, Potassium 4.2, Sodium 142 12/14/2017Creatinine 1.48, BUN 36, Potassium 3.8, Sodium 141 03/20/2016 Creatinine 1.90, BUN 37, Potassium 3.9, Sodium 141, EGFR 28-33  03/19/2016 Creatinine 2.03, BUN 37, Potassium 4.0, Sodium 141, EGFR 26-30  03/18/2016 Creatinine 1.96, BUN 35, Potassium 3.6, Sodium 142, EGFR 27-31  03/17/2016 Creatinine 1.76, BUN 29, Potassium 3.5, Sodium 142, EGFR 31-36 03/16/2016 Creatinine 1.79, BUN 29, Potassium 3.8, Sodium 142, EGFR 30-35  03/15/2016 Creatinine 1.87, BUN 4.1, Potassium 4.1, Sodium 137,EGFR 29-33  12/03/2015 Creatinine 1.70, BUN 36, Potassium 3.9, Sodium 144  10/28/2015 Creatinine 1.59, BUN 38, Potassium 4.7, Sodium 143  10/21/2015 Creatinine 1.73, BUN 28, Potassium 4.3, Sodium 139, EGFR 32-37  Recommendations: No changes.   Encouraged to call for fluid symptoms.  Follow-up plan: ICM clinic phone appointment on 05/15/2017.    Copy of ICM check sent to Dr. Klein.   3 month ICM trend: 04/13/2017    1  Year ICM trend:        S , RN 04/13/2017 2:49 PM   

## 2017-04-14 ENCOUNTER — Encounter: Payer: Self-pay | Admitting: Cardiology

## 2017-05-01 LAB — CUP PACEART REMOTE DEVICE CHECK
Battery Remaining Percentage: 95.5 %
Battery Voltage: 2.96 V
Brady Statistic AP VP Percent: 42 %
Brady Statistic AS VP Percent: 39 %
Brady Statistic RA Percent Paced: 8.2 %
Date Time Interrogation Session: 20181227085016
Implantable Lead Implant Date: 20150330
Implantable Lead Implant Date: 20150330
Implantable Lead Location: 753858
Implantable Lead Location: 753860
Lead Channel Impedance Value: 410 Ohm
Lead Channel Pacing Threshold Amplitude: 0.75 V
Lead Channel Pacing Threshold Amplitude: 1 V
Lead Channel Pacing Threshold Amplitude: 1 V
Lead Channel Pacing Threshold Pulse Width: 0.4 ms
Lead Channel Pacing Threshold Pulse Width: 0.4 ms
Lead Channel Pacing Threshold Pulse Width: 0.6 ms
Lead Channel Setting Sensing Sensitivity: 2 mV
MDC IDC LEAD IMPLANT DT: 20150330
MDC IDC LEAD LOCATION: 753859
MDC IDC MSMT BATTERY REMAINING LONGEVITY: 88 mo
MDC IDC MSMT LEADCHNL LV IMPEDANCE VALUE: 640 Ohm
MDC IDC MSMT LEADCHNL RA IMPEDANCE VALUE: 430 Ohm
MDC IDC MSMT LEADCHNL RA SENSING INTR AMPL: 0.2 mV
MDC IDC MSMT LEADCHNL RV SENSING INTR AMPL: 9.3 mV
MDC IDC PG IMPLANT DT: 20150330
MDC IDC PG SERIAL: 2978403
MDC IDC SET LEADCHNL LV PACING AMPLITUDE: 1.5 V
MDC IDC SET LEADCHNL LV PACING PULSEWIDTH: 0.4 ms
MDC IDC SET LEADCHNL RA PACING AMPLITUDE: 2.5 V
MDC IDC SET LEADCHNL RV PACING AMPLITUDE: 2.5 V
MDC IDC SET LEADCHNL RV PACING PULSEWIDTH: 0.4 ms
MDC IDC STAT BRADY AP VS PERCENT: 7.2 %
MDC IDC STAT BRADY AS VS PERCENT: 6.1 %

## 2017-05-15 ENCOUNTER — Ambulatory Visit (INDEPENDENT_AMBULATORY_CARE_PROVIDER_SITE_OTHER): Payer: Medicare Other

## 2017-05-15 DIAGNOSIS — I5022 Chronic systolic (congestive) heart failure: Secondary | ICD-10-CM | POA: Diagnosis not present

## 2017-05-15 DIAGNOSIS — Z950 Presence of cardiac pacemaker: Secondary | ICD-10-CM

## 2017-05-15 NOTE — Progress Notes (Signed)
EPIC Encounter for ICM Monitoring  Patient Name: Levi Lewis is a 84 y.o. male Date: 05/15/2017 Primary Care Physican: [name], MD Primary Cardiologist:Skains/Gerhardt, NP Electrophysiologist: Faustino Congress Weight: 174lbs Bi-V Pacing: >99% AT/AF Burden <1%      Spoke with son, Ronalee Belts. Heart Failure questions reviewed, pt asymptomatic.   Thoracic impedance normal but was abnormal suggesting fluid accumulation from 04/23/2017 to 05/01/2017.  Prescribed dosage: Torsemide 20 mg 2 tablets (40 mg total) by mouth 2 (two) times daily. Potassium 20 mEq 1 tablet daily  Labs: 02/07/2017 Creatinine 1.86, BUN 42, Potassium 4.3, Sodium 143, EGFR 30-34 10/24/2016 Creatinine 1.94, BUN 44, Potassium 4.4, Sodium 142, EGFR 28-33 08/02/2016 Creatinine 2.22, BUN 45, Potassium 4.2, Sodium 142, EGFR 24-28 06/28/2016 Creatinine 1.87, BUN 40, Potassium 4.7, Sodium 144, EGFR 29-34 06/10/2016 Creatinine 1.95, BUN 41, Potassium 4.6, Sodium 142, EGFR 28-32 05/16/2016 Creatinine 1.92, BUN 34, Potassium 4.2, Sodium 142 12/14/2017Creatinine 1.48, BUN 36, Potassium 3.8, Sodium 141 03/20/2016 Creatinine 1.90, BUN 37, Potassium 3.9, Sodium 141, EGFR 28-33  03/19/2016 Creatinine 2.03, BUN 37, Potassium 4.0, Sodium 141, EGFR 26-30  03/18/2016 Creatinine 1.96, BUN 35, Potassium 3.6, Sodium 142, EGFR 27-31  03/17/2016 Creatinine 1.76, BUN 29, Potassium 3.5, Sodium 142, EGFR 31-36 03/16/2016 Creatinine 1.79, BUN 29, Potassium 3.8, Sodium 142, EGFR 30-35  03/15/2016 Creatinine 1.87, BUN 4.1, Potassium 4.1, Sodium 137,EGFR 29-33  12/03/2015 Creatinine 1.70, BUN 36, Potassium 3.9, Sodium 144  10/28/2015 Creatinine 1.59, BUN 38, Potassium 4.7, Sodium 143  10/21/2015 Creatinine 1.73, BUN 28, Potassium 4.3, Sodium 139, EGFR 32-37  Recommendations: No changes.   Encouraged to call for fluid symptoms.  Follow-up plan: ICM clinic phone appointment on 06/15/2017.  Office appointment scheduled 06/13/2017 with Truitt Merle, NP.  Copy of ICM check sent to Dr. Caryl Comes.   3 month ICM trend: 05/15/2017    1 Year ICM trend:       Rosalene Billings, RN 05/15/2017 1:43 PM

## 2017-06-02 ENCOUNTER — Other Ambulatory Visit (HOSPITAL_COMMUNITY)
Admission: RE | Admit: 2017-06-02 | Discharge: 2017-06-02 | Disposition: A | Discharge status: Home / Self Care | Payer: Medicare Other | Source: Ambulatory Visit | Attending: Internal Medicine | Admitting: Internal Medicine

## 2017-06-02 DIAGNOSIS — M109 Gout, unspecified: Secondary | ICD-10-CM | POA: Diagnosis present

## 2017-06-02 LAB — SYNOVIAL CELL COUNT + DIFF, W/ CRYSTALS
Eosinophils-Synovial: 0 % (ref 0–1)
Lymphocytes-Synovial Fld: 1 % (ref 0–20)
MONOCYTE-MACROPHAGE-SYNOVIAL FLUID: 17 % — AB (ref 50–90)
Neutrophil, Synovial: 82 % — ABNORMAL HIGH (ref 0–25)
WBC, Synovial: 3350 /mm3 — ABNORMAL HIGH (ref 0–200)

## 2017-06-05 ENCOUNTER — Ambulatory Visit (INDEPENDENT_AMBULATORY_CARE_PROVIDER_SITE_OTHER): Payer: Self-pay

## 2017-06-05 ENCOUNTER — Other Ambulatory Visit: Payer: Self-pay | Admitting: Cardiology

## 2017-06-05 ENCOUNTER — Telehealth: Payer: Self-pay

## 2017-06-05 DIAGNOSIS — I5022 Chronic systolic (congestive) heart failure: Secondary | ICD-10-CM

## 2017-06-05 DIAGNOSIS — Z950 Presence of cardiac pacemaker: Secondary | ICD-10-CM

## 2017-06-05 NOTE — Progress Notes (Signed)
EPIC Encounter for ICM Monitoring  Patient Name: Levi Lewis is a 84 y.o. male Date: 06/05/2017 Primary Care Physican: [name], MD Primary Cardiologist:Skains/Gerhardt, NP Electrophysiologist: Caryl Comes Dry Weight: 180lbs Bi-V Pacing: 70% AT/AF Burden >99%           Son called. He reported patient has 5 lb weight gain from 175 lbs to 180 lbs in last week and swelling of both feet.     Thoracic impedance abnormal suggesting fluid accumulation.  Prescribed dosage: Torsemide 20 mg 2 tablets (40 mg total) by mouth 2 (two) times daily. Potassium 20 mEq 1 tablet daily  Labs: 02/07/2017 Creatinine 1.86, BUN 42, Potassium 4.3, Sodium 143, EGFR 30-34 10/24/2016 Creatinine 1.94, BUN 44, Potassium 4.4, Sodium 142, EGFR 28-33 08/02/2016 Creatinine 2.22, BUN 45, Potassium 4.2, Sodium 142, EGFR 24-28 06/28/2016 Creatinine 1.87, BUN 40, Potassium 4.7, Sodium 144, EGFR 29-34 06/10/2016 Creatinine 1.95, BUN 41, Potassium 4.6, Sodium 142, EGFR 28-32 05/16/2016 Creatinine 1.92, BUN 34, Potassium 4.2, Sodium 142 12/14/2017Creatinine 1.48, BUN 36, Potassium 3.8, Sodium 141 03/20/2016 Creatinine 1.90, BUN 37, Potassium 3.9, Sodium 141, EGFR 28-33  03/19/2016 Creatinine 2.03, BUN 37, Potassium 4.0, Sodium 141, EGFR 26-30  03/18/2016 Creatinine 1.96, BUN 35, Potassium 3.6, Sodium 142, EGFR 27-31  03/17/2016 Creatinine 1.76, BUN 29, Potassium 3.5, Sodium 142, EGFR 31-36 03/16/2016 Creatinine 1.79, BUN 29, Potassium 3.8, Sodium 142, EGFR 30-35  03/15/2016 Creatinine 1.87, BUN 4.1, Potassium 4.1, Sodium 137,EGFR 29-33  12/03/2015 Creatinine 1.70, BUN 36, Potassium 3.9, Sodium 144  10/28/2015 Creatinine 1.59, BUN 38, Potassium 4.7, Sodium 143  10/21/2015 Creatinine 1.73, BUN 28, Potassium 4.3, Sodium 139, EGFR 32-37  Recommendations:  Advised will review with Dr Caryl Comes in the office 2/19 and to use ER if symptoms worsen.   Follow-up plan: ICM clinic phone appointment on 06/12/2017 (manual  send).  Office appointment scheduled 06/13/2017 with Truitt Merle, NP.  Copy of ICM check sent to Dr. Caryl Comes and Dr Marlou Porch.   3 month ICM trend: 06/05/2017    1 Year ICM trend:       Rosalene Billings, RN 06/05/2017 12:10 PM

## 2017-06-05 NOTE — Telephone Encounter (Signed)
Received call from son, Legrand Como.  Patient has gained 5 lbs (170 lbs to 180 lbs) in past week and has swelling of both feet.  Advised to send remote transmission for review.  See ICM note.

## 2017-06-06 MED ORDER — TORSEMIDE 20 MG PO TABS: 40.0000 mg | ORAL_TABLET | Freq: Two times a day (BID) | ORAL | 2 refills | Status: DC

## 2017-06-06 NOTE — Progress Notes (Signed)
Reviewed with Dr Caryl Comes in the office.  Call to son and advised Dr Caryl Comes ordered increase Torsemide to 4 tablets (80 mg total) every AM and 2 tablets (40 mg total) every PM x 3 days.  After 3rd day, resume Torsemide 2 tablets (40 mg total) twice a day.  Dr. Caryl Comes advised a BMET may be drawn at office visit with Truitt Merle, NP on 06/13/2017.  Patient needed Torsemide refill and refill script was sent. Will recheck fluid levels on 06/12/2017 so that Cecille Rubin may review at office visit on 06/13/2017.

## 2017-06-07 LAB — ANAEROBIC CULTURE

## 2017-06-11 ENCOUNTER — Other Ambulatory Visit: Payer: Self-pay | Admitting: Cardiology

## 2017-06-12 ENCOUNTER — Ambulatory Visit (INDEPENDENT_AMBULATORY_CARE_PROVIDER_SITE_OTHER): Payer: Self-pay

## 2017-06-12 ENCOUNTER — Telehealth: Payer: Self-pay

## 2017-06-12 DIAGNOSIS — Z950 Presence of cardiac pacemaker: Secondary | ICD-10-CM

## 2017-06-12 DIAGNOSIS — I5022 Chronic systolic (congestive) heart failure: Secondary | ICD-10-CM

## 2017-06-12 NOTE — Progress Notes (Signed)
EPIC Encounter for ICM Monitoring  Patient Name: Levi Lewis is a 84 y.o. male Date: 06/12/2017 Primary Care Physican: [name], MD Primary Cardiologist:Skains/Gerhardt,NP Electrophysiologist: Caryl Comes Dry Weight: Previous weight 180lbs Bi-V Pacing: 75% AT/AF Burden >99%        Attempted call to son, Levi Lewis, and no answer.  Left message.  Transmission reviewed.    Thoracic impedance slightly abnormal suggesting fluid accumulation since 06/01/2017 (for 11 days) even after taking Torsemide to 4 tablets (80 mg total) every AM and 2 tablets (40 mg total) every PM x 3 days.  Prescribed dosage: Torsemide 20 mg 2 tablets (40 mg total) by mouth 2 (two) times daily. Potassium 20 mEq 1 tablet daily  Labs: 02/07/2017 Creatinine 1.86, BUN 42, Potassium 4.3, Sodium 143, EGFR 30-34 10/24/2016 Creatinine 1.94, BUN 44, Potassium 4.4, Sodium 142, EGFR 28-33 08/02/2016 Creatinine 2.22, BUN 45, Potassium 4.2, Sodium 142, EGFR 24-28 06/28/2016 Creatinine 1.87, BUN 40, Potassium 4.7, Sodium 144, EGFR 29-34 06/10/2016 Creatinine 1.95, BUN 41, Potassium 4.6, Sodium 142, EGFR 28-32 05/16/2016 Creatinine 1.92, BUN 34, Potassium 4.2, Sodium 142 12/14/2017Creatinine 1.48, BUN 36, Potassium 3.8, Sodium 141 03/20/2016 Creatinine 1.90, BUN 37, Potassium 3.9, Sodium 141, EGFR 28-33  03/19/2016 Creatinine 2.03, BUN 37, Potassium 4.0, Sodium 141, EGFR 26-30  03/18/2016 Creatinine 1.96, BUN 35, Potassium 3.6, Sodium 142, EGFR 27-31  03/17/2016 Creatinine 1.76, BUN 29, Potassium 3.5, Sodium 142, EGFR 31-36 03/16/2016 Creatinine 1.79, BUN 29, Potassium 3.8, Sodium 142, EGFR 30-35  03/15/2016 Creatinine 1.87, BUN 4.1, Potassium 4.1, Sodium 137,EGFR 29-33  12/03/2015 Creatinine 1.70, BUN 36, Potassium 3.9, Sodium 144  10/28/2015 Creatinine 1.59, BUN 38, Potassium 4.7, Sodium 143  10/21/2015 Creatinine 1.73, BUN 28, Potassium 4.3, Sodium 139, EGFR 32-37  Recommendations: Left voice mail with ICM  number and encouraged to call if experiencing any fluid symptoms.  Follow-up plan: ICM clinic phone appointment on 06/20/2017 to recheck fluid levels.  Office appointment scheduled 06/13/2017 with Truitt Merle, NP.  Copy of ICM check sent to Dr. Caryl Comes and Truitt Merle, NP.   3 month ICM trend: 06/12/2017    1 Year ICM trend:       Rosalene Billings, RN 06/12/2017 1:14 PM

## 2017-06-12 NOTE — Telephone Encounter (Signed)
Remote ICM transmission received.  Attempted call to son, Ronalee Belts, and left detailed message per [facility] regarding transmission and next ICM scheduled for 06/20/2017.  Advised to return call for any fluid symptoms or questions.

## 2017-06-13 ENCOUNTER — Encounter: Payer: Self-pay | Admitting: Nurse Practitioner

## 2017-06-13 ENCOUNTER — Ambulatory Visit (INDEPENDENT_AMBULATORY_CARE_PROVIDER_SITE_OTHER): Payer: Medicare Other | Admitting: Nurse Practitioner

## 2017-06-13 VITALS — BP 140/58 | HR 82 | Ht 70.0 in | Wt 179.8 lb

## 2017-06-13 DIAGNOSIS — N189 Chronic kidney disease, unspecified: Secondary | ICD-10-CM | POA: Diagnosis not present

## 2017-06-13 DIAGNOSIS — I2510 Atherosclerotic heart disease of native coronary artery without angina pectoris: Secondary | ICD-10-CM

## 2017-06-13 DIAGNOSIS — I5022 Chronic systolic (congestive) heart failure: Secondary | ICD-10-CM | POA: Diagnosis not present

## 2017-06-13 DIAGNOSIS — Z950 Presence of cardiac pacemaker: Secondary | ICD-10-CM | POA: Diagnosis not present

## 2017-06-13 LAB — CBC
Hematocrit: 35.4 % — ABNORMAL LOW (ref 37.5–51.0)
Hemoglobin: 11.9 g/dL — ABNORMAL LOW (ref 13.0–17.7)
MCH: 31.5 pg (ref 26.6–33.0)
MCHC: 33.6 g/dL (ref 31.5–35.7)
MCV: 94 fL (ref 79–97)
Platelets: 272 10*3/uL (ref 150–379)
RBC: 3.78 x10E6/uL — ABNORMAL LOW (ref 4.14–5.80)
RDW: 14.5 % (ref 12.3–15.4)
WBC: 7.3 10*3/uL (ref 3.4–10.8)

## 2017-06-13 LAB — BASIC METABOLIC PANEL
BUN/Creatinine Ratio: 23 (ref 10–24)
BUN: 52 mg/dL — ABNORMAL HIGH (ref 10–36)
CO2: 23 mmol/L (ref 20–29)
Calcium: 9.1 mg/dL (ref 8.6–10.2)
Chloride: 105 mmol/L (ref 96–106)
Creatinine, Ser: 2.3 mg/dL — ABNORMAL HIGH (ref 0.76–1.27)
GFR calc Af Amer: 26 mL/min/{1.73_m2} — ABNORMAL LOW (ref >59)
GFR calc non Af Amer: 23 mL/min/{1.73_m2} — ABNORMAL LOW (ref >59)
Glucose: 108 mg/dL — ABNORMAL HIGH (ref 65–99)
Potassium: 4.6 mmol/L (ref 3.5–5.2)
Sodium: 145 mmol/L — ABNORMAL HIGH (ref 134–144)

## 2017-06-13 MED ORDER — TORSEMIDE 20 MG PO TABS: ORAL_TABLET | ORAL | 6 refills | Status: DC

## 2017-06-13 NOTE — Progress Notes (Addendum)
CARDIOLOGY OFFICE NOTE  Date:  06/13/2017    Levi Lewis Date of Birth: 07-Feb-1918 Medical Record #[id number]  PCP:  Levi Erp, MD  Cardiologist:  [facility]   Chief Complaint  Patient presents with  . Congestive Heart Failure  . Atrial Fibrillation  . Coronary Artery Disease    4 month check - seen for Dr. Marlou Porch    History of Present Illness: Levi Lewis is a 84 y.o. male who presents today for a follow up visit. Seen for Dr. Marlou Porch. Former patient of Dr. Kae Heller.   He has a history of chronic systolic HF, has underlying BiV PPM, persistent AF despite cardioversion, and CAD. He remains on chronic anticoagulation with Eliquis - renal dose. He is 84 years of age.   He has had a cardioversion back in 06/28/16 however he was unable to maintain sinus rhythm.  Atrial fibrillation has been permanent via pacemaker interrogation.   Last seen back in October by Dr. Marlou Porch - felt to be stable.   Has had device check yesterday - "Thoracic impedance slightly abnormal suggesting fluid accumulation since 06/01/2017 (for 11 days) even after taking Torsemide to 4 tablets (80 mg total) every AM and 2 tablets (40 mg total) every PM x 3 days"  Comes in today. Here with his son. He is doing ok. Does feel more short of breath. More swelling - mostly in the left leg. Weight has been up at home. Does not add extra salt but gets food brought in from the outside. Has had some falls - fell yesterday - sounds like getting tripped up over his feet. No serious injury. Tries to take care of his 57 year old blind wife. Fortunately, no chest pain.  Family does his medicines. Did use some extra Torsemide for 3 days but no real response.   Past Medical History:  Diagnosis Date  . Aortic insufficiency   . Arthritis    "back, knees, right leg" (03/15/2016)  . Atrial fibrillation ([place])   . CAD (coronary artery disease)   . Cardiomyopathy, EF by echo 25-30% 07/14/2013  . CHF (congestive heart failure) ([place])    . Chronic lower back pain   . COPD (chronic obstructive pulmonary disease) ([place])   . Dyslipidemia   . GERD (gastroesophageal reflux disease)   . Gout   . Hypertension   . Hypothyroidism   . Left bundle branch block   . Lung nodules   . Mitral regurgitation   . On home oxygen therapy    "2L; 24/7 for the last week" (03/15/2016)  . Pacemaker   . Pneumonia    "2-3 times" (03/15/2016)  . RBBB   . Shortness of breath   . Shoulder pain    left; limited ROM  . Skin cancer    "left side of my nose; had another one maybe off my back" (03/15/2016)    Past Surgical History:  Procedure Laterality Date  . BACK SURGERY    . BI-VENTRICULAR PACEMAKER INSERTION (CRT-P)  07-15-2013   STJ Quadra Allura CRTP implanted by Dr Caryl Comes for NICM, CHF, alternating bundle branch blocks  . CARDIOVERSION N/A 09/13/2013   Procedure: CARDIOVERSION;  Surgeon: Carlena Bjornstad, MD;  Location: [facility] ENDOSCOPY;  Service: Cardiovascular;  Laterality: N/A;  . CARDIOVERSION N/A 10/17/2013   Procedure: CARDIOVERSION;  Surgeon: Lelon Perla, MD;  Location: [facility] ENDOSCOPY;  Service: Cardiovascular;  Laterality: N/A;  . CARDIOVERSION N/A 07/04/2016   Procedure: CARDIOVERSION;  Surgeon: Sanda Klein, MD;  Location: [facility] ENDOSCOPY;  Service: Cardiovascular;  Laterality: N/A;  . CATARACT EXTRACTION W/ INTRAOCULAR LENS  IMPLANT, BILATERAL Bilateral   . CORONARY ANGIOPLASTY    . INSERT / REPLACE / REMOVE PACEMAKER    . KNEE ARTHROSCOPY Right   . LUMBAR DISC SURGERY    . PERMANENT PACEMAKER INSERTION N/A 07/15/2013   Procedure: PERMANENT PACEMAKER INSERTION;  Surgeon: Deboraha Sprang, MD;  Location: [facility] CATH LAB;  Service: Cardiovascular;  Laterality: N/A;  . SHOULDER ARTHROSCOPY Right   . THROAT SURGERY  ~ 1950   "exploratory; felt like I had something in my throat & they went in to look around"  . TONSILLECTOMY       Medications: Current Meds  Medication Sig  . allopurinol (ZYLOPRIM) 100 MG tablet Take 100 mg by mouth  daily.  Marland Kitchen apixaban (ELIQUIS) 2.5 MG TABS tablet Take 2.5 mg by mouth 2 (two) times daily.  . carvedilol (COREG) 6.25 MG tablet TAKE 1 TABLET (6.25 MG TOTAL) BY MOUTH 2 (TWO) TIMES DAILY WITH A MEAL.  Marland Kitchen ELIQUIS 2.5 MG TABS tablet TAKE 1 TABLET TWICE A DAY  . etodolac (LODINE) 400 MG tablet Take 400 mg by mouth as needed (for gout).   . fenofibrate (TRICOR) 48 MG tablet TAKE 1/2 TABLET BY MOUTH EVERY DAY  . fish oil-omega-3 fatty acids 1000 MG capsule Take 2 g by mouth daily.   Marland Kitchen gabapentin (NEURONTIN) 300 MG capsule Take 300 mg by mouth 2 (two) times daily.  Marland Kitchen guaiFENesin (MUCINEX) 600 MG 12 hr tablet Take 600 mg by mouth 2 (two) times daily as needed for cough or to loosen phlegm (COLDS).   Marland Kitchen KLOR-CON M20 20 MEQ tablet TAKE 1 TABLET BY MOUTH EVERY DAY  . levothyroxine (SYNTHROID, LEVOTHROID) 75 MCG tablet Take 1 tablet (75 mcg total) by mouth daily.  . Multiple Vitamins-Minerals (OCUVITE PO) Take 2 tablets by mouth daily.   Marland Kitchen omeprazole (PRILOSEC) 20 MG capsule Take 20 mg by mouth daily.   . predniSONE (DELTASONE) 10 MG tablet Take 10 mg by mouth daily.   . tamsulosin (FLOMAX) 0.4 MG CAPS capsule Take 0.4 mg by mouth 2 (two) times daily.   Marland Kitchen torsemide (DEMADEX) 20 MG tablet Take 2 tablets (40 mg total) by mouth 2 (two) times daily.  . triazolam (HALCION) 0.25 MG tablet Take 0.5 tablets (0.125 mg total) by mouth at bedtime as needed for sleep.  Marland Kitchen ULORIC 40 MG tablet Take 40 mg by mouth daily.  . vitamin B-12 (CYANOCOBALAMIN) 100 MCG tablet Take 100 mcg by mouth daily.  . vitamin E 100 UNIT capsule Take 100 Units by mouth daily.     Allergies: No Known Allergies  Social History: The patient  reports that  has never smoked. he has never used smokeless tobacco. He reports that he does not drink alcohol or use drugs.   Family History: The patient's family history includes Heart attack in his sister; Lung cancer in his brother.   Review of Systems: Please see the history of present  illness.   Otherwise, the review of systems is positive for none.   All other systems are reviewed and negative.   Physical Exam: VS:  BP (!) 140/58 (BP Location: Left Arm, Patient Position: Sitting, Cuff Size: Normal)   Pulse 82   Ht 5\' 10"  (1.778 m)   Wt 179 lb 12.8 oz (81.6 kg)   SpO2 98% Comment: at rest  BMI 25.80 kg/m  .  BMI Body mass index is 25.8  kg/m.  Wt Readings from Last 3 Encounters:  06/13/17 179 lb 12.8 oz (81.6 kg)  02/07/17 177 lb 9.6 oz (80.6 kg)  10/24/16 169 lb 1.9 oz (76.7 kg)   Repeat BP by me is 100/60  General: Elderly male. Alert and in no acute distress.  Using a walker.  HEENT: Normal. Several areas of presumed skin cancer on his face.  Neck: Supple, no JVD, carotid bruits, or masses noted.  Cardiac: Irregular irregular rhythm. Rate is ok. Heart tones are distant. Left leg with 1+ edema.  Respiratory:  Lungs with few crackles in the bases and with normal work of breathing.  GI: Soft and nontender.  MS: No deformity or atrophy. Gait and ROM intact. Using a walker.  Skin: Warm and dry. Color is normal.  Neuro:  Strength and sensation are intact and no gross focal deficits noted.  Psych: Alert, appropriate and with normal affect.   LABORATORY DATA:  EKG:  EKG is not ordered today.  Lab Results  Component Value Date   WBC 9.0 02/07/2017   HGB 12.2 (L) 02/07/2017   HCT 36.5 (L) 02/07/2017   PLT 238 02/07/2017   GLUCOSE 127 (H) 02/07/2017   CHOL 166 09/17/2010   TRIG 224.0 (H) 09/17/2010   HDL 37.40 (L) 09/17/2010   LDLDIRECT 109.6 09/17/2010   LDLCALC (H) 08/07/2008    113        Total Cholesterol/HDL:CHD Risk Coronary Heart Disease Risk Table                     Men   Women  1/2 Average Risk   3.4   3.3  Average Risk       5.0   4.4  2 X Average Risk   9.6   7.1  3 X Average Risk  23.4   11.0        Use the calculated Patient Ratio above and the CHD Risk Table to determine the patient's CHD Risk.        ATP III CLASSIFICATION  (LDL):  <100     mg/dL   Optimal  100-129  mg/dL   Near or Above                    Optimal  130-159  mg/dL   Borderline  160-189  mg/dL   High  >190     mg/dL   Very High   ALT 19 03/15/2016   AST 19 03/15/2016   NA 143 02/07/2017   K 4.3 02/07/2017   CL 103 02/07/2017   CREATININE 1.86 (H) 02/07/2017   BUN 42 (H) 02/07/2017   CO2 24 02/07/2017   TSH 2.055 03/15/2016   INR 1.25 03/15/2016     BNP (last 3 results) No results for input(s): BNP in the last 8760 hours.  ProBNP (last 3 results) No results for input(s): PROBNP in the last 8760 hours.   Other Studies Reviewed Today:  Echo Study Conclusions November 2017  - Left ventricle: The cavity size was mildly dilated. Systolic   function was severely reduced. The estimated ejection fraction   was in the range of 20% to 25%. Doppler parameters are consistent   with restrictive physiology, indicative of decreased left   ventricular diastolic compliance and/or increased left atrial   pressure. Doppler parameters are consistent with elevated   ventricular end-diastolic filling pressure. - Ventricular septum: Septal motion showed paradox. - Aortic valve: Trileaflet; mildly thickened, mildly calcified  leaflets. There was mild regurgitation. - Mitral valve: Structurally normal valve. There was moderate   regurgitation. - Left atrium: The atrium was mildly dilated. - Right ventricle: Systolic function was normal. - Right atrium: The atrium was normal in size. - Tricuspid valve: There was moderate regurgitation. - Pulmonic valve: Structurally normal valve. There was no   regurgitation. - Pulmonary arteries: Systolic pressure was mildly increased. PA   peak pressure: 38 mm Hg (S). - Pericardium, extracardiac: There was no pericardial effusion.  Impressions:  - Severely reduced LVEF with diffuse hypokinesis, paradoxical   septal motion and akinetic apical segments. There is no thrombus.   Restrictive pattern of  diastolic dysfunction.   Assessment/Plan:  1. Persistent atrial fibrillation - he is managed with rate control and anticoagulation. Has PPM in place.   2. Chronic systolic heart failure - EF is 20- 25% - managed medically - no ACE due to CKD - recent device transmission showing some fluid accumulation - no real change noted with extra diuretic. Will go ahead and increase his maintenance dose to 60 mg in the AM and 40 mg in the PM. Lab today.   3. Coronary artery disease - no active chest pain - managed conservatively.   4. High risk medicine - lab today.   5. Underlying Bi-V PPM - followed by EP - sees Dr. Caryl Comes in March.   6. Falls - little worrisome with anticoagulation on board - son does not feel that the frequency has increased. No significant injury reported.   7. Advanced age  Current medicines are reviewed with the patient today.  The patient does not have concerns regarding medicines other than what has been noted above.  The following changes have been made:  See above.  Labs/ tests ordered today include:   No orders of the defined types were placed in this encounter.    Disposition:   FU with Dr. Marlou Porch in 4 months.  Seeing Dr. Caryl Comes in March.   Patient is agreeable to this plan and will call if any problems develop in the interim.   SignedTruitt Merle, NP  06/13/2017 8:12 AM  [place] [address] [place], [place]  [address] Phone: [phone] Fax: [phone]

## 2017-06-13 NOTE — Patient Instructions (Addendum)
We will be checking the following labs today - BMET and CBC   Medication Instructions:    Continue with your current medicines. BUT  Let's increase the Torsemide to 3 pills in the AM and 2 in the early afternoon    Testing/Procedures To Be Arranged:  N/A  Follow-Up:   See Dr. Caryl Comes in March as planned  See Dr. Marlou Porch in 4 months    Other Special Instructions:   N/A    If you need a refill on your cardiac medications before your next appointment, please call your pharmacy.   Call the [place] office at [phone] if you have any questions, problems or concerns.

## 2017-06-20 ENCOUNTER — Ambulatory Visit (INDEPENDENT_AMBULATORY_CARE_PROVIDER_SITE_OTHER): Payer: Medicare Other

## 2017-06-20 DIAGNOSIS — Z950 Presence of cardiac pacemaker: Secondary | ICD-10-CM

## 2017-06-20 DIAGNOSIS — I5022 Chronic systolic (congestive) heart failure: Secondary | ICD-10-CM

## 2017-06-20 NOTE — Progress Notes (Signed)
EPIC Encounter for ICM Monitoring  Patient Name: Levi Lewis is a 84 y.o. male Date: 06/20/2017 Primary Care Physican: [name], MD Primary Cardiologist:Skains/Gerhardt,NP Electrophysiologist: Caryl Comes Dry Weight: 174lbs (baseline is 174-175 lbs) Bi-V Pacing: 75% AT/AF Burden >99%       Spoke with son, Ronalee Belts. Heart Failure questions reviewed, pt asymptomatic. Weight has returned to baseline and stable.    Thoracic impedance returned to normal since maintenance dose increased on 06/13/2017.  Prescribed dosage: Torsemide 20 mg 3 tablets (60 mg total) by mouth every AM and 2 tablets (40 mg total) early afternoon (increased 2/26 at office visit). Potassium 20 mEq 1 tablet daily  Labs: 06/13/2017 Creatinine 2.30, BUN 52, Potassium 4.6, Sodium 145, EGFR 23-26 02/07/2017 Creatinine 1.86, BUN 42, Potassium 4.3, Sodium 143, EGFR 30-34 10/24/2016 Creatinine 1.94, BUN 44, Potassium 4.4, Sodium 142, EGFR 28-33 08/02/2016 Creatinine 2.22, BUN 45, Potassium 4.2, Sodium 142, EGFR 24-28 06/28/2016 Creatinine 1.87, BUN 40, Potassium 4.7, Sodium 144, EGFR 29-34 06/10/2016 Creatinine 1.95, BUN 41, Potassium 4.6, Sodium 142, EGFR 28-32 05/16/2016 Creatinine 1.92, BUN 34, Potassium 4.2, Sodium 142 12/14/2017Creatinine 1.48, BUN 36, Potassium 3.8, Sodium 141 03/20/2016 Creatinine 1.90, BUN 37, Potassium 3.9, Sodium 141, EGFR 28-33  03/19/2016 Creatinine 2.03, BUN 37, Potassium 4.0, Sodium 141, EGFR 26-30  03/18/2016 Creatinine 1.96, BUN 35, Potassium 3.6, Sodium 142, EGFR 27-31  03/17/2016 Creatinine 1.76, BUN 29, Potassium 3.5, Sodium 142, EGFR 31-36 03/16/2016 Creatinine 1.79, BUN 29, Potassium 3.8, Sodium 142, EGFR 30-35  03/15/2016 Creatinine 1.87, BUN 4.1, Potassium 4.1, Sodium 137,EGFR 29-33  12/03/2015 Creatinine 1.70, BUN 36, Potassium 3.9, Sodium 144  10/28/2015 Creatinine 1.59, BUN 38, Potassium 4.7, Sodium 143  10/21/2015 Creatinine 1.73, BUN 28, Potassium 4.3, Sodium 139, EGFR  32-37  Recommendations:  No changes.   Encouraged to call for fluid symptoms.  Follow-up plan: ICM clinic phone appointment on 08/07/2017.  Office appointment scheduled with Dr Caryl Comes on 07/05/2017.  Copy of ICM check sent to Dr. Caryl Comes.   3 month ICM trend: 06/20/2017    1 Year ICM trend:       Rosalene Billings, RN 06/20/2017 8:59 AM

## 2017-06-21 ENCOUNTER — Encounter: Payer: Self-pay | Admitting: Cardiology

## 2017-06-21 DIAGNOSIS — Z79899 Other long term (current) drug therapy: Secondary | ICD-10-CM

## 2017-06-21 NOTE — Telephone Encounter (Signed)
Spoke with pt's son who stated he has not urinated all day after several bottles of water and a cup of coffee. I brought this to the DOD, Dr Burt Knack. He advised the patient take his evening dose of 40mg  Torsemide and come in for a BMP tomorrow to follow renal function. Pt's son agreed and had no further questions.

## 2017-06-22 ENCOUNTER — Other Ambulatory Visit: Payer: Medicare Other | Admitting: *Deleted

## 2017-06-22 ENCOUNTER — Other Ambulatory Visit: Payer: Self-pay | Admitting: Cardiovascular Disease

## 2017-06-22 LAB — BASIC METABOLIC PANEL
BUN/Creatinine Ratio: 18 (ref 10–24)
BUN: 38 mg/dL — ABNORMAL HIGH (ref 10–36)
CALCIUM: 8.8 mg/dL (ref 8.6–10.2)
CHLORIDE: 102 mmol/L (ref 96–106)
CO2: 23 mmol/L (ref 20–29)
CREATININE: 2.17 mg/dL — AB (ref 0.76–1.27)
GFR calc Af Amer: 28 mL/min/{1.73_m2} — ABNORMAL LOW (ref >59)
GFR calc non Af Amer: 24 mL/min/{1.73_m2} — ABNORMAL LOW (ref >59)
GLUCOSE: 119 mg/dL — AB (ref 65–99)
Potassium: 4.8 mmol/L (ref 3.5–5.2)
Sodium: 143 mmol/L (ref 134–144)

## 2017-07-05 ENCOUNTER — Ambulatory Visit (INDEPENDENT_AMBULATORY_CARE_PROVIDER_SITE_OTHER): Payer: Medicare Other | Admitting: Internal Medicine

## 2017-07-05 ENCOUNTER — Encounter: Payer: Self-pay | Admitting: Internal Medicine

## 2017-07-05 VITALS — BP 116/68 | HR 74 | Ht 70.0 in | Wt 176.0 lb

## 2017-07-05 DIAGNOSIS — I481 Persistent atrial fibrillation: Secondary | ICD-10-CM

## 2017-07-05 DIAGNOSIS — Z79899 Other long term (current) drug therapy: Secondary | ICD-10-CM

## 2017-07-05 DIAGNOSIS — I4819 Other persistent atrial fibrillation: Secondary | ICD-10-CM

## 2017-07-05 DIAGNOSIS — I2510 Atherosclerotic heart disease of native coronary artery without angina pectoris: Secondary | ICD-10-CM

## 2017-07-05 DIAGNOSIS — I495 Sick sinus syndrome: Secondary | ICD-10-CM | POA: Diagnosis not present

## 2017-07-05 DIAGNOSIS — Z950 Presence of cardiac pacemaker: Secondary | ICD-10-CM

## 2017-07-05 LAB — CUP PACEART INCLINIC DEVICE CHECK
Battery Voltage: 2.96 V
Date Time Interrogation Session: 20190320140941
Implantable Lead Implant Date: 20150330
Implantable Lead Implant Date: 20150330
Implantable Lead Implant Date: 20150330
Implantable Lead Location: 753858
Lead Channel Pacing Threshold Pulse Width: 0.4 ms
Lead Channel Pacing Threshold Pulse Width: 0.4 ms
Lead Channel Sensing Intrinsic Amplitude: 10.1 mV
Lead Channel Setting Pacing Amplitude: 1.5 V
Lead Channel Setting Pacing Amplitude: 2.5 V
Lead Channel Setting Pacing Pulse Width: 0.4 ms
MDC IDC LEAD LOCATION: 753859
MDC IDC LEAD LOCATION: 753860
MDC IDC MSMT BATTERY REMAINING LONGEVITY: 97 mo
MDC IDC MSMT LEADCHNL LV PACING THRESHOLD AMPLITUDE: 1.25 V
MDC IDC MSMT LEADCHNL RA SENSING INTR AMPL: 0.2 mV
MDC IDC MSMT LEADCHNL RV PACING THRESHOLD AMPLITUDE: 0.875 V
MDC IDC PG IMPLANT DT: 20150330
MDC IDC SET LEADCHNL LV PACING PULSEWIDTH: 0.4 ms
MDC IDC SET LEADCHNL RV SENSING SENSITIVITY: 2 mV
MDC IDC STAT BRADY RA PERCENT PACED: 6.5 %
MDC IDC STAT BRADY RV PERCENT PACED: 75 %
Pulse Gen Model: 3242
Pulse Gen Serial Number: 2978403

## 2017-07-05 MED ORDER — TORSEMIDE 20 MG PO TABS: 80.0000 mg | ORAL_TABLET | Freq: Every day | ORAL | 3 refills | Status: AC

## 2017-07-05 NOTE — Progress Notes (Signed)
Patient Care Team: [name], MD as PCP - General (Internal Medicine)   HPI  Levi Lewis is a 84 y.o. male Seen in followup for CRT P. implantation 3/15.this occurred in the context of paroxysmal atrial fibrillation and alternating bundle branch block.  EF 25-30% 3/15  He takes apixoban.  Amiodarone stopped with persistent and now permanent Afib.  Undertook DCCV for recurrent afib,  Rapid reversion  Decision to let afib remain   He has peripheral edema and dyspnea on exertion.  His salt intake is  reasonably restricted; his fluid intake is copious.  He denies chest pain.  Atrial Fibrillation Management history:  Previous antiarrhythmic drugs:  amio  Previous cardioversions: 3/18  NSR but rapid reversion  Previous ablations: None   CHADS2VASC score: Thromboembolic risk factors ( age  -2, HTN-1, Vasc disease -1, CHF-1 ,) for a CHADSVASc Score of 5  Anticoagulation history: apixoban   Date Cr Hgb  7/18 1.94    3/19 2.17 11.9     He wakes up in the morning complaining that his mouth is dry.  Past Medical History:  Diagnosis Date  . Aortic insufficiency   . Arthritis    "back, knees, right leg" (03/15/2016)  . Atrial fibrillation ([place])   . CAD (coronary artery disease)   . Cardiomyopathy, EF by echo 25-30% 07/14/2013  . CHF (congestive heart failure) ([place])   . Chronic lower back pain   . COPD (chronic obstructive pulmonary disease) ([place])   . Dyslipidemia   . GERD (gastroesophageal reflux disease)   . Gout   . Hypertension   . Hypothyroidism   . Left bundle branch block   . Lung nodules   . Mitral regurgitation   . On home oxygen therapy    "2L; 24/7 for the last week" (03/15/2016)  . Pacemaker   . Pneumonia    "2-3 times" (03/15/2016)  . RBBB   . Shortness of breath   . Shoulder pain    left; limited ROM  . Skin cancer    "left side of my nose; had another one maybe off my back" (03/15/2016)    Past Surgical History:  Procedure Laterality  Date  . BACK SURGERY    . BI-VENTRICULAR PACEMAKER INSERTION (CRT-P)  07-15-2013   STJ Quadra Allura CRTP implanted by Dr Caryl Comes for NICM, CHF, alternating bundle branch blocks  . CARDIOVERSION N/A 09/13/2013   Procedure: CARDIOVERSION;  Surgeon: Carlena Bjornstad, MD;  Location[facility] ENDOSCOPY;  Service: Cardiovascular;  Laterality: N/A;  . CARDIOVERSION N/A 10/17/2013   Procedure: CARDIOVERSION;  Surgeon: Lelon Perla, MD;  Location: [facility];  Service: Cardiovascular;  Laterality: N/A;  . CARDIOVERSION N/A 07/04/2016   Procedure: CARDIOVERSION;  Surgeon: Sanda Bence Trapp, MD;  Location: [facility] ENDOSCOPY;  Service: Cardiovascular;  Laterality: N/A;  . CATARACT EXTRACTION W/ INTRAOCULAR LENS  IMPLANT, BILATERAL Bilateral   . CORONARY ANGIOPLASTY    . INSERT / REPLACE / REMOVE PACEMAKER    . KNEE ARTHROSCOPY Right   . LUMBAR DISC SURGERY    . PERMANENT PACEMAKER INSERTION N/A 07/15/2013   Procedure: PERMANENT PACEMAKER INSERTION;  Surgeon: Deboraha Sprang, MD;  Location: [facility] CATH LAB;  Service: Cardiovascular;  Laterality: N/A;  . SHOULDER ARTHROSCOPY Right   . THROAT SURGERY  ~ 1950   "exploratory; felt like I had something in my throat & they went in to look around"  . TONSILLECTOMY      Current Outpatient Medications  Medication Sig Dispense  Refill  . allopurinol (ZYLOPRIM) 100 MG tablet Take 100 mg by mouth daily.    . carvedilol (COREG) 6.25 MG tablet TAKE 1 TABLET (6.25 MG TOTAL) BY MOUTH 2 (TWO) TIMES DAILY WITH A MEAL. 60 tablet 5  . ELIQUIS 2.5 MG TABS tablet TAKE 1 TABLET TWICE A DAY 180 tablet 2  . etodolac (LODINE) 400 MG tablet Take 400 mg by mouth as needed (for gout).   0  . fenofibrate (TRICOR) 48 MG tablet TAKE 1/2 TABLET BY MOUTH EVERY DAY 45 tablet 2  . fish oil-omega-3 fatty acids 1000 MG capsule Take 2 g by mouth daily.     Marland Kitchen gabapentin (NEURONTIN) 300 MG capsule Take 300 mg by mouth 2 (two) times daily.  12  . guaiFENesin (MUCINEX) 600 MG 12 hr tablet Take 600 mg by mouth 2  (two) times daily as needed for cough or to loosen phlegm (COLDS).     Marland Kitchen KLOR-CON M20 20 MEQ tablet TAKE 1 TABLET BY MOUTH EVERY DAY 30 tablet 11  . levothyroxine (SYNTHROID, LEVOTHROID) 75 MCG tablet Take 1 tablet (75 mcg total) by mouth daily. 30 tablet 6  . Multiple Vitamins-Minerals (OCUVITE PO) Take 2 tablets by mouth daily.     Marland Kitchen omeprazole (PRILOSEC) 20 MG capsule Take 20 mg by mouth daily.     . predniSONE (DELTASONE) 10 MG tablet Take 10 mg by mouth daily.   12  . tamsulosin (FLOMAX) 0.4 MG CAPS capsule Take 0.4 mg by mouth 2 (two) times daily.     . triazolam (HALCION) 0.25 MG tablet Take 0.5 tablets (0.125 mg total) by mouth at bedtime as needed for sleep. 15 tablet 0  . ULORIC 40 MG tablet Take 40 mg by mouth daily.  5  . vitamin B-12 (CYANOCOBALAMIN) 100 MCG tablet Take 100 mcg by mouth daily.    . vitamin E 100 UNIT capsule Take 100 Units by mouth daily.     No current facility-administered medications for this visit.     No Known Allergies  Review of Systems negative except from HPI and PMH  Physical Exam BP 116/68   Pulse 74   Ht 5\' 10"  (1.778 m)   Wt 176 lb (79.8 kg)   SpO2 96%   BMI 25.25 kg/m  Well developed and well nourished in no acute distress HENT normal  Actinic keratoses  E scleral and icterus clear Neck Supple JVP 8; carotids brisk and full Clear to ausculation  Regular rate and rhythm, no murmurs gallops or rub Soft with active bowel sounds No clubbing cyanosis 1-2+ Edema Alert and oriented, grossly normal motor and sensory function Skin Warm and Dry  ECG demonstrates BiV pacing   Assessment and  Plan  Atrial fib/flutter-permanent  HFrEF  Renal insufficiency grade 4  Alternating bundle branch block  Dyspnea  CRT-P  St Jude   Unfortunately, the data was not clearly we do not have by V pacing percentages since 3/18.  Heart rate excursion seems relatively well controlled  He is modestly volume overloaded.  We will adjust his diuretics  1 more time going from 60/40--80.  We will have him follow-up with [facility] next month for Dr. Marlou Porch to reassess volume status and kidney function  We also discussed his breathing issues.  I suggested nasal saline washes as well as Breathe Right strips  We spent more than 50% of our >25 min visit in face to face counseling regarding the above

## 2017-07-05 NOTE — Patient Instructions (Addendum)
Medication Instructions:  Your physician has recommended you make the following change in your medication:   1. Increase Demadex (Torsemide) to 80mg , 4 tablets, daily  Labwork: None ordered.  Testing/Procedures: None ordered.  Follow-Up: Your physician recommends that you schedule a follow-up appointment in:   One Year with Dr Theodis Sato in one month  Keep follow up with Dr Marlou Porch on 6/26   Remote monitoring is used to monitor your Pacemaker from home. This monitoring reduces the number of office visits required to check your device to one time per year. It allows [place] to keep an eye on the functioning of your device to ensure it is working properly. You are scheduled for a device check from home on 07/13/2017. You may send your transmission at any time that day. If you have a wireless device, the transmission will be sent automatically. After your physician reviews your transmission, you will receive a postcard with your next transmission date.    Any Other Special Instructions Will Be Listed Below (If Applicable).   Decrease fluid intake    If you need a refill on your cardiac medications before your next appointment, please call your pharmacy.

## 2017-07-06 ENCOUNTER — Other Ambulatory Visit: Payer: Self-pay | Admitting: Physician Assistant

## 2017-07-13 ENCOUNTER — Encounter: Payer: Medicare Other | Admitting: *Deleted

## 2017-07-14 ENCOUNTER — Encounter: Payer: Self-pay | Admitting: Cardiology

## 2017-07-29 ENCOUNTER — Other Ambulatory Visit: Payer: Self-pay | Admitting: Cardiology

## 2017-08-03 ENCOUNTER — Other Ambulatory Visit: Payer: Self-pay | Admitting: Physician Assistant

## 2017-08-07 ENCOUNTER — Ambulatory Visit (INDEPENDENT_AMBULATORY_CARE_PROVIDER_SITE_OTHER): Payer: Medicare Other

## 2017-08-07 DIAGNOSIS — I5022 Chronic systolic (congestive) heart failure: Secondary | ICD-10-CM

## 2017-08-07 DIAGNOSIS — Z950 Presence of cardiac pacemaker: Secondary | ICD-10-CM | POA: Diagnosis not present

## 2017-08-07 NOTE — Progress Notes (Signed)
CARDIOLOGY OFFICE NOTE  Date:  08/08/2017    Levi Lewis Date of Birth: July 16, 1917 Medical Record #[id number]  PCP:  [name], MD  Cardiologist:  Servando Snare Skains/Klein    Chief Complaint  Patient presents with  . Congestive Heart Failure    1 month check - seen for Dr. Hampton Abbot    History of Present Illness: Levi Lewis is a 84 y.o. male who presents today for a one month check at the request of Dr. Caryl Comes. He is seen for Dr. Marlou Porch. Former patient of Dr. Kae Heller.   He has a history of chronic systolic HF, has underlying BiV PPM, persistent AF despite cardioversion, and CAD. He remains on chronic anticoagulation with Eliquis - renal dose. He is 84 years of age.   He has had a cardioversion back in 06/28/16 however he was unable to maintain sinus rhythm. Atrial fibrillation has been permanent via pacemaker interrogation.   Last seen back in October by Dr. Marlou Porch - felt to be stable.   I saw him back in February after calling in - he had had more volume overload. Some falls as well - worrisome with being on anticoagulation. Tries to take care of his 56 year old blind wife. Saw Dr. Caryl Comes last month and continued to exhibit signs of overload - diuretics increased again.   Comes in today. Here with his son. He says he is doing ok. He will have some intermittent shortness of breath. Sits most of the day - still tries to take care of his blind wife - she is also demented. They get food brought in from the outside - probably gets too much salt - he does not add extra. Trying to not drink copious amounts. Currently on 80 mg of Torsemide. Weight is down a few pounds. No swelling. No chest pain. He seems pretty comfortable. BP a little higher at home - he is not dizzy. No falls since last seen with me.   Past Medical History:  Diagnosis Date  . Aortic insufficiency   . Arthritis    "back, knees, right leg" (03/15/2016)  . Atrial fibrillation ([place])   . CAD (coronary  artery disease)   . Cardiomyopathy, EF by echo 25-30% 07/14/2013  . CHF (congestive heart failure) ([place])   . Chronic lower back pain   . COPD (chronic obstructive pulmonary disease) ([place])   . Dyslipidemia   . GERD (gastroesophageal reflux disease)   . Gout   . Hypertension   . Hypothyroidism   . Left bundle branch block   . Lung nodules   . Mitral regurgitation   . On home oxygen therapy    "2L; 24/7 for the last week" (03/15/2016)  . Pacemaker   . Pneumonia    "2-3 times" (03/15/2016)  . RBBB   . Shortness of breath   . Shoulder pain    left; limited ROM  . Skin cancer    "left side of my nose; had another one maybe off my back" (03/15/2016)    Past Surgical History:  Procedure Laterality Date  . BACK SURGERY    . BI-VENTRICULAR PACEMAKER INSERTION (CRT-P)  07-15-2013   STJ Quadra Allura CRTP implanted by Dr Caryl Comes for NICM, CHF, alternating bundle branch blocks  . CARDIOVERSION N/A 09/13/2013   Procedure: CARDIOVERSION;  Surgeon: Carlena Bjornstad, MD;  Location: [facility] ENDOSCOPY;  Service: Cardiovascular;  Laterality: N/A;  . CARDIOVERSION N/A 10/17/2013   Procedure: CARDIOVERSION;  Surgeon: Lelon Perla, MD;  Location: [place] ENDOSCOPY;  Service: Cardiovascular;  Laterality: N/A;  . CARDIOVERSION N/A 07/04/2016   Procedure: CARDIOVERSION;  Surgeon: Sanda Klein, MD;  Location: [place];  Service: Cardiovascular;  Laterality: N/A;  . CATARACT EXTRACTION W/ INTRAOCULAR LENS  IMPLANT, BILATERAL Bilateral   . CORONARY ANGIOPLASTY    . INSERT / REPLACE / REMOVE PACEMAKER    . KNEE ARTHROSCOPY Right   . LUMBAR DISC SURGERY    . PERMANENT PACEMAKER INSERTION N/A 07/15/2013   Procedure: PERMANENT PACEMAKER INSERTION;  Surgeon: Deboraha Sprang, MD;  Location: [facility] CATH LAB;  Service: Cardiovascular;  Laterality: N/A;  . SHOULDER ARTHROSCOPY Right   . THROAT SURGERY  ~ 1950   "exploratory; felt like I had something in my throat & they went in to look around"  . TONSILLECTOMY        Medications: Current Meds  Medication Sig  . carvedilol (COREG) 6.25 MG tablet TAKE 1 TABLET (6.25 MG TOTAL) BY MOUTH 2 (TWO) TIMES DAILY WITH A MEAL.  Marland Kitchen ELIQUIS 2.5 MG TABS tablet TAKE 1 TABLET TWICE A DAY  . etodolac (LODINE) 400 MG tablet Take 400 mg by mouth as needed (for gout).   . fenofibrate (TRICOR) 48 MG tablet TAKE 1/2 TABLET BY MOUTH EVERY DAY  . fish oil-omega-3 fatty acids 1000 MG capsule Take 2 g by mouth daily.   Marland Kitchen gabapentin (NEURONTIN) 300 MG capsule Take 300 mg by mouth 2 (two) times daily.  Marland Kitchen guaiFENesin (MUCINEX) 600 MG 12 hr tablet Take 600 mg by mouth 2 (two) times daily as needed for cough or to loosen phlegm (COLDS).   Marland Kitchen KLOR-CON M20 20 MEQ tablet TAKE 1 TABLET BY MOUTH EVERY DAY  . levothyroxine (SYNTHROID, LEVOTHROID) 75 MCG tablet Take 1 tablet (75 mcg total) by mouth daily.  . Multiple Vitamins-Minerals (OCUVITE PO) Take 2 tablets by mouth daily.   Marland Kitchen omeprazole (PRILOSEC) 20 MG capsule Take 20 mg by mouth daily.   . predniSONE (DELTASONE) 10 MG tablet Take 10 mg by mouth daily.   . tamsulosin (FLOMAX) 0.4 MG CAPS capsule Take 0.4 mg by mouth 2 (two) times daily.   Marland Kitchen torsemide (DEMADEX) 20 MG tablet Take 4 tablets (80 mg total) by mouth daily.  . triazolam (HALCION) 0.25 MG tablet Take 0.5 tablets (0.125 mg total) by mouth at bedtime as needed for sleep.  Marland Kitchen ULORIC 40 MG tablet Take 40 mg by mouth daily.  . vitamin B-12 (CYANOCOBALAMIN) 100 MCG tablet Take 100 mcg by mouth daily.  . vitamin E 100 UNIT capsule Take 100 Units by mouth daily.  . [DISCONTINUED] allopurinol (ZYLOPRIM) 100 MG tablet Take 100 mg by mouth daily.     Allergies: No Known Allergies  Social History: The patient  reports that he has never smoked. He has never used smokeless tobacco. He reports that he does not drink alcohol or use drugs.   Family History: The patient's family history includes Heart attack in his sister; Lung cancer in his brother.   Review of Systems: Please  see the history of present illness.   Otherwise, the review of systems is positive for none.   All other systems are reviewed and negative.   Physical Exam: VS:  BP (!) 100/50 (BP Location: Left Arm, Patient Position: Sitting, Cuff Size: Normal)   Pulse 72   Ht 5\' 10"  (1.778 m)   Wt 173 lb (78.5 kg)   SpO2 95% Comment: at rest  BMI 24.82 kg/m  .  BMI Body mass  index is 24.82 kg/m.  Wt Readings from Last 3 Encounters:  08/08/17 173 lb (78.5 kg)  07/05/17 176 lb (79.8 kg)  06/13/17 179 lb 12.8 oz (81.6 kg)    General: Pleasant. Elderly male. Alert and in no acute distress.   HEENT: Normal. Multiple presumed skin cancers on his face/head.  Neck: Supple, no JVD, carotid bruits, or masses noted.  Cardiac: Regular rate and rhythm - presumed paced.  No edema today.  Respiratory:  Lungs are clear to auscultation bilaterally with normal work of breathing.  GI: Soft and nontender.  MS: No deformity or atrophy. Gait and ROM intact. Using his walker.  Skin: Warm and dry. Color is normal.  Neuro:  Strength and sensation are intact and no gross focal deficits noted.  Psych: Alert, appropriate and with normal affect.   LABORATORY DATA:  EKG:  EKG is not ordered today.  Lab Results  Component Value Date   WBC 7.3 06/13/2017   HGB 11.9 (L) 06/13/2017   HCT 35.4 (L) 06/13/2017   PLT 272 06/13/2017   GLUCOSE 119 (H) 06/22/2017   CHOL 166 09/17/2010   TRIG 224.0 (H) 09/17/2010   HDL 37.40 (L) 09/17/2010   LDLDIRECT 109.6 09/17/2010   LDLCALC (H) 08/07/2008    113        Total Cholesterol/HDL:CHD Risk Coronary Heart Disease Risk Table                     Men   Women  1/2 Average Risk   3.4   3.3  Average Risk       5.0   4.4  2 X Average Risk   9.6   7.1  3 X Average Risk  23.4   11.0        Use the calculated Patient Ratio above and the CHD Risk Table to determine the patient's CHD Risk.        ATP III CLASSIFICATION (LDL):  <100     mg/dL   Optimal  100-129  mg/dL   Near or  Above                    Optimal  130-159  mg/dL   Borderline  160-189  mg/dL   High  >190     mg/dL   Very High   ALT 19 03/15/2016   AST 19 03/15/2016   NA 143 06/22/2017   K 4.8 06/22/2017   CL 102 06/22/2017   CREATININE 2.17 (H) 06/22/2017   BUN 38 (H) 06/22/2017   CO2 23 06/22/2017   TSH 2.055 03/15/2016   INR 1.25 03/15/2016     BNP (last 3 results) No results for input(s): BNP in the last 8760 hours.  ProBNP (last 3 results) No results for input(s): PROBNP in the last 8760 hours.   Other Studies Reviewed Today:  Echo Study Conclusions November 2017  - Left ventricle: The cavity size was mildly dilated. Systolic function was severely reduced. The estimated ejection fraction was in the range of 20% to 25%. Doppler parameters are consistent with restrictive physiology, indicative of decreased left ventricular diastolic compliance and/or increased left atrial pressure. Doppler parameters are consistent with elevated ventricular end-diastolic filling pressure. - Ventricular septum: Septal motion showed paradox. - Aortic valve: Trileaflet; mildly thickened, mildly calcified leaflets. There was mild regurgitation. - Mitral valve: Structurally normal valve. There was moderate regurgitation. - Left atrium: The atrium was mildly dilated. - Right ventricle: Systolic function was normal. -  Right atrium: The atrium was normal in size. - Tricuspid valve: There was moderate regurgitation. - Pulmonic valve: Structurally normal valve. There was no regurgitation. - Pulmonary arteries: Systolic pressure was mildly increased. PA peak pressure: 38 mm Hg (S). - Pericardium, extracardiac: There was no pericardial effusion.  Impressions:  - Severely reduced LVEF with diffuse hypokinesis, paradoxical septal motion and akinetic apical segments. There is no thrombus. Restrictive pattern of diastolic dysfunction.   Assessment/Plan:  1. Persistent  atrial fibrillation - he is managed with rate control and anticoagulation. Has PPM in place. No further falls reported.   2. Chronic systolic heart failure - EF is 20- 25% - managed medically - no ACE due to CKD - he has had more issues with volume overload - requiring higher doses of diuretics - now on 80 mg of Torsemide - weight is down 3 pounds. He seems fairly comfortable. Salt use is present due to their social situation. I think overall he is holding his own. I would favor continued conservative management.   3. Coronary artery disease - no active chest pain - managed conservatively.   4. High risk medicine - repeating BMET today  5. Underlying Bi-V PPM - followed by EP   6. Falls - has not recurred since last visit with me. BP is soft.   7. Advanced age   Current medicines are reviewed with the patient today.  The patient does not have concerns regarding medicines other than what has been noted above.  The following changes have been made:  See above.  Labs/ tests ordered today include:    Orders Placed This Encounter  Procedures  . Basic metabolic panel     Disposition:   FU with Dr. Marlou Porch as planned in June.    Patient is agreeable to this plan and will call if any problems develop in the interim.   SignedTruitt Merle, NP  08/08/2017 8:30 AM  [place] [address] [place], [place]  [address] Phone: [phone] Fax: [phone]

## 2017-08-07 NOTE — Progress Notes (Signed)
EPIC Encounter for ICM Monitoring  Patient Name: Levi Lewis is a 84 y.o. male Date: 08/07/2017 Primary Care Physican: [name], MD Primary Cardiologist:Skains/Gerhardt,NP Electrophysiologist: Faustino Congress Weight:174lbs (baseline is 174-175 lbs) Bi-V Pacing: 85% AT/AF Burden >99%      Spoke with son.  Heart Failure questions reviewed, pt asymptomatic.   Thoracic impedance normal but was abnormal suggesting fluid accumulation from 08/03/2017 - 08/05/2017.  Prescribed dosage: Torsemide 20 mg 4 tablets (80 mg total) daily. Potassium 20 mEq 1 tablet daily  Labs: 06/13/2017 Creatinine 2.30, BUN 52, Potassium 4.6, Sodium 145, EGFR 23-26 02/07/2017 Creatinine 1.86, BUN 42, Potassium 4.3, Sodium 143, EGFR 30-34 10/24/2016 Creatinine 1.94, BUN 44, Potassium 4.4, Sodium 142, EGFR 28-33 08/02/2016 Creatinine 2.22, BUN 45, Potassium 4.2, Sodium 142, EGFR 24-28 06/28/2016 Creatinine 1.87, BUN 40, Potassium 4.7, Sodium 144, EGFR 29-34 06/10/2016 Creatinine 1.95, BUN 41, Potassium 4.6, Sodium 142, EGFR 28-32 05/16/2016 Creatinine 1.92, BUN 34, Potassium 4.2, Sodium 142  Recommendations: No changes.  Encouraged to call for fluid symptoms.  Follow-up plan: ICM clinic phone appointment on 09/07/2017.  Office appointment scheduled 08/08/2017 with Truitt Merle, NP.  Copy of ICM check sent to Dr. Caryl Comes and Truitt Merle, NP.   3 month ICM trend: 08/07/2017     AT/AF     1 Year ICM trend:       Rosalene Billings, RN 08/07/2017 12:27 PM

## 2017-08-08 ENCOUNTER — Encounter: Payer: Self-pay | Admitting: Nurse Practitioner

## 2017-08-08 ENCOUNTER — Ambulatory Visit (INDEPENDENT_AMBULATORY_CARE_PROVIDER_SITE_OTHER): Payer: Medicare Other | Admitting: Nurse Practitioner

## 2017-08-08 VITALS — BP 100/50 | HR 72 | Ht 70.0 in | Wt 173.0 lb

## 2017-08-08 DIAGNOSIS — I481 Persistent atrial fibrillation: Secondary | ICD-10-CM | POA: Diagnosis not present

## 2017-08-08 DIAGNOSIS — I255 Ischemic cardiomyopathy: Secondary | ICD-10-CM | POA: Diagnosis not present

## 2017-08-08 DIAGNOSIS — I4819 Other persistent atrial fibrillation: Secondary | ICD-10-CM

## 2017-08-08 DIAGNOSIS — I5022 Chronic systolic (congestive) heart failure: Secondary | ICD-10-CM

## 2017-08-08 LAB — BASIC METABOLIC PANEL
BUN/Creatinine Ratio: 21 (ref 10–24)
BUN: 41 mg/dL — ABNORMAL HIGH (ref 10–36)
CO2: 22 mmol/L (ref 20–29)
Calcium: 9.1 mg/dL (ref 8.6–10.2)
Chloride: 105 mmol/L (ref 96–106)
Creatinine, Ser: 2 mg/dL — ABNORMAL HIGH (ref 0.76–1.27)
GFR calc Af Amer: 31 mL/min/{1.73_m2} — ABNORMAL LOW (ref >59)
GFR calc non Af Amer: 27 mL/min/{1.73_m2} — ABNORMAL LOW (ref >59)
Glucose: 141 mg/dL — ABNORMAL HIGH (ref 65–99)
Potassium: 4.8 mmol/L (ref 3.5–5.2)
Sodium: 144 mmol/L (ref 134–144)

## 2017-08-08 NOTE — Patient Instructions (Addendum)
We will be checking the following labs today - BMET   Medication Instructions:    Continue with your current medicines.     Testing/Procedures To Be Arranged:  N/A  Follow-Up:   See Dr. Marlou Porch in June as planned.     Other Special Instructions:   N/A    If you need a refill on your cardiac medications before your next appointment, please call your pharmacy.   Call the [place] office at [phone] if you have any questions, problems or concerns.

## 2017-08-12 ENCOUNTER — Other Ambulatory Visit: Payer: Self-pay | Admitting: Cardiology

## 2017-08-12 DIAGNOSIS — I5022 Chronic systolic (congestive) heart failure: Secondary | ICD-10-CM

## 2017-09-07 ENCOUNTER — Ambulatory Visit (INDEPENDENT_AMBULATORY_CARE_PROVIDER_SITE_OTHER): Payer: Medicare Other

## 2017-09-07 DIAGNOSIS — I5022 Chronic systolic (congestive) heart failure: Secondary | ICD-10-CM

## 2017-09-07 DIAGNOSIS — Z950 Presence of cardiac pacemaker: Secondary | ICD-10-CM | POA: Diagnosis not present

## 2017-09-07 NOTE — Progress Notes (Signed)
EPIC Encounter for ICM Monitoring  Patient Name: Levi Lewis is a 84 y.o. male Date: 09/07/2017 Primary Care Physican: [name], MD Primary Care Physican: [name], MD Primary Cardiologist:Skains/Gerhardt,NP Electrophysiologist: Faustino Congress Weight:170lbs (baseline is 174-175 lbs) Bi-V Pacing: 85% AT/AF Burden >99%                                                  Spoke with son.  Heart Failure questions reviewed, pt asymptomatic.  Has small amount of intermittent leg swelling.    Thoracic impedance abnormal suggesting fluid accumulation from 08/31/2017 through today but close to baseline.  Prescribed dosage: Torsemide 20 mg4tablets (80 mg total) daily. Potassium 20 mEq 1 tablet daily  Labs: 08/08/2017 Creatinine 2.00, BUN 41, Potassium 4.8, Sodium 144, EGFR 27-31 06/22/2017 Creatinine 2.17, BUN 38, Potassium 4.8, Sodium 143, EGFR 24-28 06/13/2017 Creatinine 2.30, BUN 52, Potassium 4.6, Sodium 145, EGFR 23-26 02/07/2017 Creatinine 1.86, BUN 42, Potassium 4.3, Sodium 143, EGFR 30-34 10/24/2016 Creatinine 1.94, BUN 44, Potassium 4.4, Sodium 142, EGFR 28-33 08/02/2016 Creatinine 2.22, BUN 45, Potassium 4.2, Sodium 142, EGFR 24-28 06/28/2016 Creatinine 1.87, BUN 40, Potassium 4.7, Sodium 144, EGFR 29-34 06/10/2016 Creatinine 1.95, BUN 41, Potassium 4.6, Sodium 142, EGFR 28-32 05/16/2016 Creatinine 1.92, BUN 34, Potassium 4.2, Sodium 142  Recommendations: Restrict salt intake.  Encouraged to call for fluid symptoms.  Follow-up plan: ICM clinic phone appointment on 09/21/2017 to recheck fluid levels.  Office appointment scheduled 10/11/2017 with Dr. Marlou Porch.  Copy of ICM check sent to Dr. Marlou Porch and Dr. Caryl Comes.   3 month ICM trend: 09/07/2017    1 Year ICM trend:       Rosalene Billings, RN 09/07/2017 2:32 PM

## 2017-09-21 ENCOUNTER — Ambulatory Visit (INDEPENDENT_AMBULATORY_CARE_PROVIDER_SITE_OTHER): Payer: Self-pay

## 2017-09-21 DIAGNOSIS — I5022 Chronic systolic (congestive) heart failure: Secondary | ICD-10-CM

## 2017-09-21 DIAGNOSIS — Z950 Presence of cardiac pacemaker: Secondary | ICD-10-CM

## 2017-09-21 NOTE — Progress Notes (Signed)
EPIC Encounter for ICM Monitoring  Patient Name: Levi Lewis is a 84 y.o. male Date: 09/21/2017 Primary Care Physican: [name], MD Primary Cardiologist:Skains/Gerhardt,NP Electrophysiologist: Faustino Congress Weight:172lbs (baseline is 174-175 lbs) Bi-V Pacing: 89%        Spoke with son, Ronalee Belts.  Heart Failure questions reviewed, pt has left leg swelling at times.   Thoracic impedance normal.  Prescribed dosage: Torsemide 20 mg4tablets (80 mg total)daily. Potassium 20 mEq 1 tablet daily  Labs: 08/08/2017 Creatinine 2.00, BUN 41, Potassium 4.8, Sodium 144, EGFR 27-31 06/22/2017 Creatinine 2.17, BUN 38, Potassium 4.8, Sodium 143, EGFR 24-28 06/13/2017 Creatinine 2.30, BUN 52, Potassium 4.6, Sodium 145, EGFR 23-26 02/07/2017 Creatinine 1.86, BUN 42, Potassium 4.3, Sodium 143, EGFR 30-34 10/24/2016 Creatinine 1.94, BUN 44, Potassium 4.4, Sodium 142, EGFR 28-33 08/02/2016 Creatinine 2.22, BUN 45, Potassium 4.2, Sodium 142, EGFR 24-28 06/28/2016 Creatinine 1.87, BUN 40, Potassium 4.7, Sodium 144, EGFR 29-34 06/10/2016 Creatinine 1.95, BUN 41, Potassium 4.6, Sodium 142, EGFR 28-32 05/16/2016 Creatinine 1.92, BUN 34, Potassium 4.2, Sodium 142  Recommendations: No changes.   Encouraged to call for fluid symptoms.  Follow-up plan: ICM clinic phone appointment on 10/09/2017.  Office appointment scheduled 10/11/2017 with Dr. Marlou Porch.  Copy of ICM check sent to Dr. Caryl Comes.   3 month ICM trend: 09/21/2017    AT/AF   1 Year ICM trend:       Rosalene Billings, RN 09/21/2017 12:29 PM

## 2017-10-09 ENCOUNTER — Telehealth: Payer: Self-pay

## 2017-10-09 ENCOUNTER — Ambulatory Visit (INDEPENDENT_AMBULATORY_CARE_PROVIDER_SITE_OTHER): Payer: Medicare Other

## 2017-10-09 ENCOUNTER — Ambulatory Visit (INDEPENDENT_AMBULATORY_CARE_PROVIDER_SITE_OTHER): Payer: Medicare Other | Admitting: *Deleted

## 2017-10-09 DIAGNOSIS — I495 Sick sinus syndrome: Secondary | ICD-10-CM

## 2017-10-09 DIAGNOSIS — Z950 Presence of cardiac pacemaker: Secondary | ICD-10-CM

## 2017-10-09 DIAGNOSIS — I5022 Chronic systolic (congestive) heart failure: Secondary | ICD-10-CM | POA: Diagnosis not present

## 2017-10-09 NOTE — Telephone Encounter (Signed)
Remote ICM transmission received.  Attempted call to son, Momen Ham and left message to return call.

## 2017-10-09 NOTE — Progress Notes (Addendum)
Son returned call.  Patient asymptomatic.  Weight 169 lbs and has dropped a little in the last few weeks. No changes today and advised would send copy for Dr Marlou Porch to review and he will make any recommendations needed at the office visit 10/11/2017.  Recheck fluid levels and scheduled remote transmission for 10/17/2017.

## 2017-10-09 NOTE — Progress Notes (Signed)
Remote pacemaker transmission.   

## 2017-10-09 NOTE — Progress Notes (Signed)
EPIC Encounter for ICM Monitoring  Patient Name: Levi Lewis is a 84 y.o. male Date: 10/09/2017 Primary Care Physican: [name], MD Primary Cardiologist:Skains/Gerhardt,NP Electrophysiologist: Caryl Comes Dry Weight: previous weight 172lbs (baseline is 174-175 lbs) Bi-V Pacing: 88%            Attempted call to son, Ronalee Belts.  Left message for return call.  Transmission reviewed.   Thoracic impedance abnormal suggesting fluid accumulation starting ~10/04/2017.  Prescribed dosage: Torsemide 20 mg4tablets (80 mg total)daily. Potassium 20 mEq 1 tablet daily  Labs: 08/08/2017 Creatinine 2.00, BUN 41, Potassium 4.8, Sodium 144, EGFR 27-31 06/22/2017 Creatinine 2.17, BUN 38, Potassium 4.8, Sodium 143, EGFR 24-28 06/13/2017 Creatinine 2.30, BUN 52, Potassium 4.6, Sodium 145, EGFR 23-26 02/07/2017 Creatinine 1.86, BUN 42, Potassium 4.3, Sodium 143, EGFR 30-34 10/24/2016 Creatinine 1.94, BUN 44, Potassium 4.4, Sodium 142, EGFR 28-33 08/02/2016 Creatinine 2.22, BUN 45, Potassium 4.2, Sodium 142, EGFR 24-28 06/28/2016 Creatinine 1.87, BUN 40, Potassium 4.7, Sodium 144, EGFR 29-34 06/10/2016 Creatinine 1.95, BUN 41, Potassium 4.6, Sodium 142, EGFR 28-32 05/16/2016 Creatinine 1.92, BUN 34, Potassium 4.2, Sodium 142  Recommendations:  None, recommendations will be given at the office visit with Dr Marlou Porch 10/11/2017 if needed.   Follow-up plan: ICM clinic phone appointment on 10/17/2017 to recheck fluid levels.  Office appointment scheduled 10/11/2017 with Dr. Marlou Porch.  Copy of ICM check sent to Dr. Caryl Comes and Dr. Marlou Porch for review at office visit if needed.   3 month ICM trend: 10/09/2017    1 Year ICM trend:       Rosalene Billings, RN 10/09/2017 8:38 AM

## 2017-10-10 ENCOUNTER — Encounter: Payer: Self-pay | Admitting: Cardiology

## 2017-10-11 ENCOUNTER — Ambulatory Visit (INDEPENDENT_AMBULATORY_CARE_PROVIDER_SITE_OTHER): Payer: Medicare Other | Admitting: Cardiology

## 2017-10-11 ENCOUNTER — Ambulatory Visit: Payer: Medicare Other | Admitting: Cardiology

## 2017-10-11 ENCOUNTER — Encounter: Payer: Self-pay | Admitting: Cardiology

## 2017-10-11 VITALS — BP 106/70 | HR 73 | Ht 70.0 in | Wt 171.2 lb

## 2017-10-11 DIAGNOSIS — I5022 Chronic systolic (congestive) heart failure: Secondary | ICD-10-CM | POA: Diagnosis not present

## 2017-10-11 DIAGNOSIS — I481 Persistent atrial fibrillation: Secondary | ICD-10-CM | POA: Diagnosis not present

## 2017-10-11 DIAGNOSIS — Z950 Presence of cardiac pacemaker: Secondary | ICD-10-CM

## 2017-10-11 DIAGNOSIS — N189 Chronic kidney disease, unspecified: Secondary | ICD-10-CM

## 2017-10-11 DIAGNOSIS — I255 Ischemic cardiomyopathy: Secondary | ICD-10-CM | POA: Diagnosis not present

## 2017-10-11 DIAGNOSIS — I4819 Other persistent atrial fibrillation: Secondary | ICD-10-CM

## 2017-10-11 NOTE — Progress Notes (Signed)
Cardiology Office Note    Date:  10/11/2017   ID:  [name], DOB [birth year], MRN [id number]  PCP:  [name], MD  Cardiologist:   Candee Furbish, MD     History of Present Illness:  Levi Lewis is a 84 y.o. male with Bi-V pacemaker Dr. Caryl Comes, persistent atrial fibriallation despite cardioversion attempt 06/2016 with CAD, dilated cardiomyopathy, EF 25% here for follow up.   He had a cardioversion on 06/28/16 however unable to maintain sinus rhythm.  Atrial fibrillation has been permanent via pacemaker interrogation. AFIB feels it in the morning. BP cuff, irreg.   Overall he has been feeling fairly well.  Moderate dyspnea with minimal exertion. No chest pain, no syncope. He does not like taking the potassium 20 mEq because of the texture of the tablets.  Prior office notes reviewed.   Past Medical History:  Diagnosis Date  . Aortic insufficiency   . Arthritis    "back, knees, right leg" (03/15/2016)  . Atrial fibrillation ([place])   . CAD (coronary artery disease)   . Cardiomyopathy, EF by echo 25-30% 07/14/2013  . CHF (congestive heart failure) ([place])   . Chronic lower back pain   . COPD (chronic obstructive pulmonary disease) ([place])   . Dyslipidemia   . GERD (gastroesophageal reflux disease)   . Gout   . Hypertension   . Hypothyroidism   . Left bundle branch block   . Lung nodules   . Mitral regurgitation   . On home oxygen therapy    "2L; 24/7 for the last week" (03/15/2016)  . Pacemaker   . Pneumonia    "2-3 times" (03/15/2016)  . RBBB   . Shortness of breath   . Shoulder pain    left; limited ROM  . Skin cancer    "left side of my nose; had another one maybe off my back" (03/15/2016)    Past Surgical History:  Procedure Laterality Date  . BACK SURGERY    . BI-VENTRICULAR PACEMAKER INSERTION (CRT-P)  07-15-2013   STJ Quadra Allura CRTP implanted by Dr Caryl Comes for NICM, CHF, alternating bundle branch blocks  . CARDIOVERSION N/A 09/13/2013   Procedure:  CARDIOVERSION;  Surgeon: Carlena Bjornstad, MD;  Location: [facility] ENDOSCOPY;  Service: Cardiovascular;  Laterality: N/A;  . CARDIOVERSION N/A 10/17/2013   Procedure: CARDIOVERSION;  Surgeon: Lelon Perla, MD;  Location: [place];  Service: Cardiovascular;  Laterality: N/A;  . CARDIOVERSION N/A 07/04/2016   Procedure: CARDIOVERSION;  Surgeon: Sanda Klein, MD;  Location: [facility] ENDOSCOPY;  Service: Cardiovascular;  Laterality: N/A;  . CATARACT EXTRACTION W/ INTRAOCULAR LENS  IMPLANT, BILATERAL Bilateral   . CORONARY ANGIOPLASTY    . INSERT / REPLACE / REMOVE PACEMAKER    . KNEE ARTHROSCOPY Right   . LUMBAR DISC SURGERY    . PERMANENT PACEMAKER INSERTION N/A 07/15/2013   Procedure: PERMANENT PACEMAKER INSERTION;  Surgeon: Deboraha Sprang, MD;  Location: [facility] CATH LAB;  Service: Cardiovascular;  Laterality: N/A;  . SHOULDER ARTHROSCOPY Right   . THROAT SURGERY  ~ 1950   "exploratory; felt like I had something in my throat & they went in to look around"  . TONSILLECTOMY      Current Medications: Outpatient Medications Prior to Visit  Medication Sig Dispense Refill  . apixaban (ELIQUIS) 2.5 MG TABS tablet Take 2.5 mg by mouth 2 (two) times daily.    . carvedilol (COREG) 6.25 MG tablet TAKE 1 TABLET (6.25 MG TOTAL) BY MOUTH 2 (TWO) TIMES DAILY  WITH A MEAL. 60 tablet 10  . etodolac (LODINE) 400 MG tablet Take 400 mg by mouth as needed (for gout).   0  . fenofibrate (TRICOR) 48 MG tablet TAKE 1/2 TABLET BY MOUTH EVERY DAY 45 tablet 2  . fish oil-omega-3 fatty acids 1000 MG capsule Take 2 g by mouth daily.     Marland Kitchen gabapentin (NEURONTIN) 300 MG capsule Take 300 mg by mouth 2 (two) times daily.  12  . guaiFENesin (MUCINEX) 600 MG 12 hr tablet Take 600 mg by mouth 2 (two) times daily as needed for cough or to loosen phlegm (COLDS).     Marland Kitchen KLOR-CON M20 20 MEQ tablet TAKE 1 TABLET BY MOUTH EVERY DAY 30 tablet 11  . levothyroxine (SYNTHROID, LEVOTHROID) 75 MCG tablet Take 1 tablet (75 mcg total) by mouth daily.  30 tablet 6  . Multiple Vitamins-Minerals (OCUVITE PO) Take 2 tablets by mouth daily.     Marland Kitchen omeprazole (PRILOSEC) 20 MG capsule Take 20 mg by mouth daily.     . predniSONE (DELTASONE) 10 MG tablet Take 10 mg by mouth daily.   12  . tamsulosin (FLOMAX) 0.4 MG CAPS capsule Take 0.4 mg by mouth 2 (two) times daily.     Marland Kitchen torsemide (DEMADEX) 20 MG tablet Take 4 tablets (80 mg total) by mouth daily. 360 tablet 3  . triazolam (HALCION) 0.25 MG tablet Take 0.5 tablets (0.125 mg total) by mouth at bedtime as needed for sleep. 15 tablet 0  . ULORIC 40 MG tablet Take 40 mg by mouth daily.  5  . vitamin B-12 (CYANOCOBALAMIN) 100 MCG tablet Take 100 mcg by mouth daily.    . vitamin E 100 UNIT capsule Take 100 Units by mouth daily.    Marland Kitchen ELIQUIS 2.5 MG TABS tablet TAKE 1 TABLET TWICE A DAY 180 tablet 2   No facility-administered medications prior to visit.      Allergies:   Patient has no known allergies.   Social History   Socioeconomic History  . Marital status: Married    Spouse name: Not on file  . Number of children: Not on file  . Years of education: Not on file  . Highest education level: Not on file  Occupational History  . Not on file  Social Needs  . Financial resource strain: Not on file  . Food insecurity:    Worry: Not on file    Inability: Not on file  . Transportation needs:    Medical: Not on file    Non-medical: Not on file  Tobacco Use  . Smoking status: Never Smoker  . Smokeless tobacco: Never Used  Substance and Sexual Activity  . Alcohol use: No    Alcohol/week: 0.0 oz  . Drug use: No  . Sexual activity: Not Currently  Lifestyle  . Physical activity:    Days per week: Not on file    Minutes per session: Not on file  . Stress: Not on file  Relationships  . Social connections:    Talks on phone: Not on file    Gets together: Not on file    Attends religious service: Not on file    Active member of club or organization: Not on file    Attends meetings of  clubs or organizations: Not on file    Relationship status: Not on file  Other Topics Concern  . Not on file  Social History Narrative   He has never smoked or drank. No illcit drug use .  He is retired from the [employer] and lives with his wife. Illicit Drug Use- no     Family History:  The patient's family history includes Heart attack in his sister; Lung cancer in his brother.   ROS:   Please see the history of present illness.    Review of Systems  All other systems reviewed and are negative.    PHYSICAL EXAM:   VS:  BP 106/70   Pulse 73   Ht 5\' 10"  (1.778 m)   Wt 171 lb 3.2 oz (77.7 kg)   SpO2 95%   BMI 24.56 kg/m    GEN: Well nourished, well developed, in no acute distress, elderly HEENT: normal  Neck: no JVD, carotid bruits, or masses Cardiac: irreg; no murmurs, rubs, or gallops, 3+ LLE edema  Respiratory:  clear to auscultation bilaterally, normal work of breathing GI: soft, nontender, nondistended, + BS, protuberant abdomen MS: no deformity or atrophy  Skin: warm and dry, no rash, facial bruise Neuro:  Alert and Oriented x 3, Strength and sensation are intact, ambulates with walker Psych: euthymic mood, full affect    Wt Readings from Last 3 Encounters:  10/11/17 171 lb 3.2 oz (77.7 kg)  08/08/17 173 lb (78.5 kg)  07/05/17 176 lb (79.8 kg)      Studies/Labs Reviewed:   EKG:  None today  Recent Labs: 06/13/2017: Hemoglobin 11.9; Platelets 272 08/08/2017: BUN 41; Creatinine, Ser 2.00; Potassium 4.8; Sodium 144   Lipid Panel    Component Value Date/Time   CHOL 166 09/17/2010 1032   TRIG 224.0 (H) 09/17/2010 1032   HDL 37.40 (L) 09/17/2010 1032   CHOLHDL 4 09/17/2010 1032   VLDL 44.8 (H) 09/17/2010 1032   LDLCALC (H) 08/07/2008 0500    113        Total Cholesterol/HDL:CHD Risk Coronary Heart Disease Risk Table                     Men   Women  1/2 Average Risk   3.4   3.3  Average Risk       5.0   4.4  2 X Average Risk   9.6   7.1  3 X Average  Risk  23.4   11.0        Use the calculated Patient Ratio above and the CHD Risk Table to determine the patient's CHD Risk.        ATP III CLASSIFICATION (LDL):  <100     mg/dL   Optimal  100-129  mg/dL   Near or Above                    Optimal  130-159  mg/dL   Borderline  160-189  mg/dL   High  >190     mg/dL   Very High   LDLDIRECT 109.6 09/17/2010 1032    Additional studies/ records that were reviewed today include:  Biventricular pacer transmission reviewed., Lab work reviewed, prior office notes reviewed, correspondence with Dr. Caryl Comes    ASSESSMENT:    1. Chronic systolic CHF (congestive heart failure) ([place])   2. Biventricular cardiac pacemaker in situ   3. Persistent atrial fibrillation ([place])   4. Pacemaker   5. Chronic kidney disease, unspecified CKD stage      PLAN:  In order of problems listed above:  Persistent atrial fibrillation  - Was unable to maintain sinus rhythm after cardioversion  - Biventricular pacing 70% of the time.  - Anticoagulation  with Eliquis 2.5  - Has not had a nosebleed in a while.  Seems quite stable.  No changes made.  Good rate control.  Chronic systolic heart failure  - EF 25%, NYHA class II-III,  shortness of breath with minimal activity, utilizes walker.-No orthopnea.  No real changes clinically.  - On carvedilol 6.25 mg twice a day. Unable to utilize ACE inhibitor because of chronic kidney disease stage IV.  -Does have some increased left lower extremity edema.  Occasional pedal edema he states.  Also protuberant abdomen.  We will give him an extra torsemide dose 20 mg to take for 2 days.  Normally takes his 80 mg daily.  his weight however has decreased.  - Continue to advocate movement, leg elevation  - Does not like swallowing potassium pills.  -Basic metabolic profile has been stable.  Coronary artery disease  - No anginal symptoms.  We will have him come back in in 3 months. APP, 6 months me  Medication Adjustments/Labs  and Tests Ordered: Current medicines are reviewed at length with the patient today.  Concerns regarding medicines are outlined above.  Medication changes, Labs and Tests ordered today are listed in the Patient Instructions below. Patient Instructions  Medication Instructions:  For the next 2 days take an extra Torsemide then return to your normal dose. Continue all other medications a listed.  Follow-Up: Follow up in 3 months with Truitt Merle, NP  Follow up in 6 months with Dr. Marlou Porch.  You will receive a letter in the mail 2 months before you are due.  Please call us when you receive this letter to schedule your follow up appointment.  If you need a refill on your cardiac medications before your next appointment, please call your pharmacy.  Thank you for choosing [facility]!!        Signed, Candee Furbish, MD  10/11/2017 10:13 AM    [address], [place], [place]  [address] Phone: [phone]; Fax: [phone]

## 2017-10-11 NOTE — Patient Instructions (Signed)
Medication Instructions:  For the next 2 days take an extra Torsemide then return to your normal dose. Continue all other medications a listed.  Follow-Up: Follow up in 3 months with Truitt Merle, NP  Follow up in 6 months with Dr. Marlou Porch.  You will receive a letter in the mail 2 months before you are due.  Please call us when you receive this letter to schedule your follow up appointment.  If you need a refill on your cardiac medications before your next appointment, please call your pharmacy.  Thank you for choosing [facility]!!

## 2017-10-13 LAB — CUP PACEART REMOTE DEVICE CHECK
Battery Remaining Longevity: 94 mo
Implantable Lead Implant Date: 20150330
Implantable Lead Location: 753858
Implantable Lead Location: 753860
Lead Channel Pacing Threshold Pulse Width: 0.4 ms
Lead Channel Setting Sensing Sensitivity: 2 mV
MDC IDC LEAD IMPLANT DT: 20150330
MDC IDC LEAD IMPLANT DT: 20150330
MDC IDC LEAD LOCATION: 753859
MDC IDC MSMT BATTERY REMAINING PERCENTAGE: 95.5 %
MDC IDC MSMT BATTERY VOLTAGE: 2.96 V
MDC IDC MSMT LEADCHNL LV IMPEDANCE VALUE: 460 Ohm
MDC IDC MSMT LEADCHNL LV PACING THRESHOLD AMPLITUDE: 0.75 V
MDC IDC MSMT LEADCHNL RV IMPEDANCE VALUE: 360 Ohm
MDC IDC MSMT LEADCHNL RV PACING THRESHOLD AMPLITUDE: 1 V
MDC IDC MSMT LEADCHNL RV PACING THRESHOLD PULSEWIDTH: 0.4 ms
MDC IDC MSMT LEADCHNL RV SENSING INTR AMPL: 9.3 mV
MDC IDC PG IMPLANT DT: 20150330
MDC IDC SESS DTM: 20190624060013
MDC IDC SET LEADCHNL LV PACING AMPLITUDE: 1.5 V
MDC IDC SET LEADCHNL LV PACING PULSEWIDTH: 0.4 ms
MDC IDC SET LEADCHNL RV PACING AMPLITUDE: 2.5 V
MDC IDC SET LEADCHNL RV PACING PULSEWIDTH: 0.4 ms
Pulse Gen Model: 3242
Pulse Gen Serial Number: 2978403

## 2017-10-17 ENCOUNTER — Ambulatory Visit (INDEPENDENT_AMBULATORY_CARE_PROVIDER_SITE_OTHER): Payer: Medicare Other

## 2017-10-17 DIAGNOSIS — I5022 Chronic systolic (congestive) heart failure: Secondary | ICD-10-CM

## 2017-10-17 DIAGNOSIS — Z950 Presence of cardiac pacemaker: Secondary | ICD-10-CM

## 2017-10-18 NOTE — Progress Notes (Signed)
EPIC Encounter for ICM Monitoring  Patient Name: Levi Lewis is a 84 y.o. male Date: 10/18/2017 Primary Care Physican: [name], MD Primary Cardiologist:Skains/Gerhardt,NP Electrophysiologist: Caryl Comes Dry Weight: 170 lbs (baseline is 174-175 lbs) Bi-V Pacing: 88%       Spoke with son, Ronalee Belts.  Heart Failure questions reviewed, pt asymptomatic.  Spouse passed away 2 days ago.    Thoracic impedance returned to normal since last ICM transmission on 10/09/2017.  Prescribed dosage: Torsemide 20 mg4tablets (80 mg total)daily. Potassium 20 mEq 1 tablet daily  Labs: 08/08/2017 Creatinine 2.00, BUN 41, Potassium 4.8, Sodium 144, EGFR 27-31 06/22/2017 Creatinine 2.17, BUN 38, Potassium 4.8, Sodium 143, EGFR 24-28 06/13/2017 Creatinine 2.30, BUN 52, Potassium 4.6, Sodium 145, EGFR 23-26 02/07/2017 Creatinine 1.86, BUN 42, Potassium 4.3, Sodium 143, EGFR 30-34 10/24/2016 Creatinine 1.94, BUN 44, Potassium 4.4, Sodium 142, EGFR 28-33 08/02/2016 Creatinine 2.22, BUN 45, Potassium 4.2, Sodium 142, EGFR 24-28 06/28/2016 Creatinine 1.87, BUN 40, Potassium 4.7, Sodium 144, EGFR 29-34 06/10/2016 Creatinine 1.95, BUN 41, Potassium 4.6, Sodium 142, EGFR 28-32 05/16/2016 Creatinine 1.92, BUN 34, Potassium 4.2, Sodium 142  Recommendations: No changes.   Encouraged to call for fluid symptoms.  Follow-up plan: ICM clinic phone appointment on 11/16/2017.    Copy of ICM check sent to Dr. Caryl Comes.   3 month ICM trend: 10/17/2017    1 Year ICM trend:       Rosalene Billings, RN 10/18/2017 12:04 PM

## 2017-10-21 ENCOUNTER — Other Ambulatory Visit: Payer: Self-pay | Admitting: Cardiology

## 2017-10-23 NOTE — Telephone Encounter (Signed)
Pt's pharmacy is requesting a refill on fenofibrate. Would Dr. Marlou Porch like to refill this medication? Please advise

## 2017-10-25 NOTE — Telephone Encounter (Signed)
Dr. Marlou Porch, please advise on refill! Thanks

## 2017-11-08 ENCOUNTER — Inpatient Hospital Stay (HOSPITAL_COMMUNITY)
Admission: EM | Admit: 2017-11-08 | Discharge: 2017-11-15 | DRG: 682 | Disposition: A | Discharge status: Hospice | Payer: Medicare Other | Attending: Family Medicine | Admitting: Family Medicine

## 2017-11-08 ENCOUNTER — Encounter (HOSPITAL_COMMUNITY): Payer: Self-pay | Admitting: *Deleted

## 2017-11-08 ENCOUNTER — Other Ambulatory Visit: Payer: Self-pay

## 2017-11-08 ENCOUNTER — Emergency Department (HOSPITAL_COMMUNITY): Payer: Medicare Other

## 2017-11-08 DIAGNOSIS — N189 Chronic kidney disease, unspecified: Secondary | ICD-10-CM

## 2017-11-08 DIAGNOSIS — Z7901 Long term (current) use of anticoagulants: Secondary | ICD-10-CM

## 2017-11-08 DIAGNOSIS — E785 Hyperlipidemia, unspecified: Secondary | ICD-10-CM | POA: Diagnosis present

## 2017-11-08 DIAGNOSIS — I2510 Atherosclerotic heart disease of native coronary artery without angina pectoris: Secondary | ICD-10-CM | POA: Diagnosis present

## 2017-11-08 DIAGNOSIS — M7989 Other specified soft tissue disorders: Secondary | ICD-10-CM

## 2017-11-08 DIAGNOSIS — I959 Hypotension, unspecified: Secondary | ICD-10-CM | POA: Diagnosis present

## 2017-11-08 DIAGNOSIS — Z9981 Dependence on supplemental oxygen: Secondary | ICD-10-CM | POA: Diagnosis not present

## 2017-11-08 DIAGNOSIS — E861 Hypovolemia: Secondary | ICD-10-CM | POA: Diagnosis not present

## 2017-11-08 DIAGNOSIS — K219 Gastro-esophageal reflux disease without esophagitis: Secondary | ICD-10-CM | POA: Diagnosis present

## 2017-11-08 DIAGNOSIS — I5043 Acute on chronic combined systolic (congestive) and diastolic (congestive) heart failure: Secondary | ICD-10-CM | POA: Diagnosis present

## 2017-11-08 DIAGNOSIS — E039 Hypothyroidism, unspecified: Secondary | ICD-10-CM | POA: Diagnosis present

## 2017-11-08 DIAGNOSIS — M25461 Effusion, right knee: Secondary | ICD-10-CM | POA: Diagnosis present

## 2017-11-08 DIAGNOSIS — R338 Other retention of urine: Secondary | ICD-10-CM | POA: Diagnosis present

## 2017-11-08 DIAGNOSIS — J9621 Acute and chronic respiratory failure with hypoxia: Secondary | ICD-10-CM | POA: Diagnosis not present

## 2017-11-08 DIAGNOSIS — I130 Hypertensive heart and chronic kidney disease with heart failure and stage 1 through stage 4 chronic kidney disease, or unspecified chronic kidney disease: Secondary | ICD-10-CM | POA: Diagnosis present

## 2017-11-08 DIAGNOSIS — E873 Alkalosis: Secondary | ICD-10-CM | POA: Diagnosis present

## 2017-11-08 DIAGNOSIS — L02511 Cutaneous abscess of right hand: Secondary | ICD-10-CM | POA: Diagnosis present

## 2017-11-08 DIAGNOSIS — Z7952 Long term (current) use of systemic steroids: Secondary | ICD-10-CM

## 2017-11-08 DIAGNOSIS — E875 Hyperkalemia: Secondary | ICD-10-CM | POA: Diagnosis present

## 2017-11-08 DIAGNOSIS — L03113 Cellulitis of right upper limb: Secondary | ICD-10-CM | POA: Diagnosis present

## 2017-11-08 DIAGNOSIS — I950 Idiopathic hypotension: Secondary | ICD-10-CM | POA: Diagnosis not present

## 2017-11-08 DIAGNOSIS — Z79899 Other long term (current) drug therapy: Secondary | ICD-10-CM

## 2017-11-08 DIAGNOSIS — Z85828 Personal history of other malignant neoplasm of skin: Secondary | ICD-10-CM | POA: Diagnosis not present

## 2017-11-08 DIAGNOSIS — N183 Chronic kidney disease, stage 3 (moderate): Secondary | ICD-10-CM

## 2017-11-08 DIAGNOSIS — I420 Dilated cardiomyopathy: Secondary | ICD-10-CM | POA: Diagnosis present

## 2017-11-08 DIAGNOSIS — Z634 Disappearance and death of family member: Secondary | ICD-10-CM

## 2017-11-08 DIAGNOSIS — I481 Persistent atrial fibrillation: Secondary | ICD-10-CM | POA: Diagnosis present

## 2017-11-08 DIAGNOSIS — J449 Chronic obstructive pulmonary disease, unspecified: Secondary | ICD-10-CM | POA: Diagnosis present

## 2017-11-08 DIAGNOSIS — J9601 Acute respiratory failure with hypoxia: Secondary | ICD-10-CM | POA: Diagnosis not present

## 2017-11-08 DIAGNOSIS — J811 Chronic pulmonary edema: Secondary | ICD-10-CM

## 2017-11-08 DIAGNOSIS — I480 Paroxysmal atrial fibrillation: Secondary | ICD-10-CM | POA: Diagnosis not present

## 2017-11-08 DIAGNOSIS — N184 Chronic kidney disease, stage 4 (severe): Secondary | ICD-10-CM | POA: Diagnosis present

## 2017-11-08 DIAGNOSIS — Z8249 Family history of ischemic heart disease and other diseases of the circulatory system: Secondary | ICD-10-CM

## 2017-11-08 DIAGNOSIS — I361 Nonrheumatic tricuspid (valve) insufficiency: Secondary | ICD-10-CM | POA: Diagnosis not present

## 2017-11-08 DIAGNOSIS — I482 Chronic atrial fibrillation: Secondary | ICD-10-CM | POA: Diagnosis not present

## 2017-11-08 DIAGNOSIS — J810 Acute pulmonary edema: Secondary | ICD-10-CM | POA: Diagnosis not present

## 2017-11-08 DIAGNOSIS — M1A9XX0 Chronic gout, unspecified, without tophus (tophi): Secondary | ICD-10-CM | POA: Diagnosis present

## 2017-11-08 DIAGNOSIS — I9589 Other hypotension: Secondary | ICD-10-CM

## 2017-11-08 DIAGNOSIS — I351 Nonrheumatic aortic (valve) insufficiency: Secondary | ICD-10-CM | POA: Diagnosis not present

## 2017-11-08 DIAGNOSIS — I5022 Chronic systolic (congestive) heart failure: Secondary | ICD-10-CM | POA: Diagnosis not present

## 2017-11-08 DIAGNOSIS — N179 Acute kidney failure, unspecified: Principal | ICD-10-CM | POA: Diagnosis present

## 2017-11-08 DIAGNOSIS — Z9861 Coronary angioplasty status: Secondary | ICD-10-CM

## 2017-11-08 DIAGNOSIS — Z515 Encounter for palliative care: Secondary | ICD-10-CM | POA: Diagnosis not present

## 2017-11-08 DIAGNOSIS — N401 Enlarged prostate with lower urinary tract symptoms: Secondary | ICD-10-CM | POA: Diagnosis present

## 2017-11-08 DIAGNOSIS — Z950 Presence of cardiac pacemaker: Secondary | ICD-10-CM | POA: Diagnosis not present

## 2017-11-08 DIAGNOSIS — E860 Dehydration: Secondary | ICD-10-CM | POA: Diagnosis present

## 2017-11-08 DIAGNOSIS — I340 Nonrheumatic mitral (valve) insufficiency: Secondary | ICD-10-CM | POA: Diagnosis not present

## 2017-11-08 DIAGNOSIS — N170 Acute kidney failure with tubular necrosis: Secondary | ICD-10-CM | POA: Diagnosis not present

## 2017-11-08 DIAGNOSIS — Z66 Do not resuscitate: Secondary | ICD-10-CM | POA: Diagnosis present

## 2017-11-08 DIAGNOSIS — I4891 Unspecified atrial fibrillation: Secondary | ICD-10-CM | POA: Diagnosis present

## 2017-11-08 DIAGNOSIS — J9600 Acute respiratory failure, unspecified whether with hypoxia or hypercapnia: Secondary | ICD-10-CM

## 2017-11-08 LAB — URINALYSIS, ROUTINE W REFLEX MICROSCOPIC
Bacteria, UA: NONE SEEN
Bilirubin Urine: NEGATIVE
Glucose, UA: NEGATIVE mg/dL
Hgb urine dipstick: NEGATIVE
KETONES UR: NEGATIVE mg/dL
LEUKOCYTES UA: NEGATIVE
Nitrite: NEGATIVE
PROTEIN: NEGATIVE mg/dL
Specific Gravity, Urine: 1.006 (ref 1.005–1.030)
pH: 5 (ref 5.0–8.0)

## 2017-11-08 LAB — BASIC METABOLIC PANEL
Anion gap: 11 (ref 5–15)
BUN: 63 mg/dL — ABNORMAL HIGH (ref 8–23)
CHLORIDE: 104 mmol/L (ref 98–111)
CO2: 22 mmol/L (ref 22–32)
CREATININE: 3.27 mg/dL — AB (ref 0.61–1.24)
Calcium: 8.5 mg/dL — ABNORMAL LOW (ref 8.9–10.3)
GFR calc Af Amer: 17 mL/min — ABNORMAL LOW (ref >60)
GFR, EST NON AFRICAN AMERICAN: 14 mL/min — AB (ref >60)
GLUCOSE: 88 mg/dL (ref 70–99)
POTASSIUM: 5.2 mmol/L — AB (ref 3.5–5.1)
SODIUM: 137 mmol/L (ref 135–145)

## 2017-11-08 LAB — CBC
HEMATOCRIT: 37.3 % — AB (ref 39.0–52.0)
Hemoglobin: 11.7 g/dL — ABNORMAL LOW (ref 13.0–17.0)
MCH: 31.7 pg (ref 26.0–34.0)
MCHC: 31.4 g/dL (ref 30.0–36.0)
MCV: 101.1 fL — AB (ref 78.0–100.0)
PLATELETS: 213 10*3/uL (ref 150–400)
RBC: 3.69 MIL/uL — ABNORMAL LOW (ref 4.22–5.81)
RDW: 14.3 % (ref 11.5–15.5)
WBC: 14.4 10*3/uL — AB (ref 4.0–10.5)

## 2017-11-08 LAB — I-STAT TROPONIN, ED: Troponin i, poc: 0.03 ng/mL (ref 0.00–0.08)

## 2017-11-08 LAB — I-STAT CG4 LACTIC ACID, ED: Lactic Acid, Venous: 1.26 mmol/L (ref 0.5–1.9)

## 2017-11-08 MED ORDER — ONDANSETRON HCL 4 MG/2ML IJ SOLN: 4.0000 mg | Freq: Four times a day (QID) | INTRAMUSCULAR | Status: DC | PRN

## 2017-11-08 MED ORDER — FENOFIBRATE 54 MG PO TABS
54.0000 mg | ORAL_TABLET | Freq: Every day | ORAL | Status: DC
  Administered 2017-11-09 – 2017-11-15 (×8): 54 mg via ORAL
  Filled 2017-11-08 (×8): qty 1

## 2017-11-08 MED ORDER — ACETAMINOPHEN 325 MG PO TABS
650.0000 mg | ORAL_TABLET | Freq: Once | ORAL | Status: AC
  Administered 2017-11-08: 650 mg via ORAL
  Filled 2017-11-08: qty 2

## 2017-11-08 MED ORDER — TAMSULOSIN HCL 0.4 MG PO CAPS
0.4000 mg | ORAL_CAPSULE | Freq: Two times a day (BID) | ORAL | Status: DC
  Administered 2017-11-09 (×2): 0.4 mg via ORAL
  Filled 2017-11-08 (×2): qty 1

## 2017-11-08 MED ORDER — SODIUM CHLORIDE 0.9 % IV BOLUS
500.0000 mL | Freq: Once | INTRAVENOUS | Status: AC
  Administered 2017-11-08: 500 mL via INTRAVENOUS

## 2017-11-08 MED ORDER — SODIUM CHLORIDE 0.9 % IV BOLUS (SEPSIS)
1000.0000 mL | Freq: Once | INTRAVENOUS | Status: AC
  Administered 2017-11-08: 1000 mL via INTRAVENOUS

## 2017-11-08 MED ORDER — SODIUM CHLORIDE 0.9 % IV BOLUS
250.0000 mL | Freq: Once | INTRAVENOUS | Status: AC
  Administered 2017-11-08: 250 mL via INTRAVENOUS

## 2017-11-08 MED ORDER — FEBUXOSTAT 40 MG PO TABS
40.0000 mg | ORAL_TABLET | Freq: Every day | ORAL | Status: DC
  Administered 2017-11-09 – 2017-11-15 (×7): 40 mg via ORAL
  Filled 2017-11-08 (×7): qty 1

## 2017-11-08 MED ORDER — PIPERACILLIN-TAZOBACTAM 3.375 G IVPB 30 MIN
3.3750 g | Freq: Once | INTRAVENOUS | Status: AC
  Administered 2017-11-08: 3.375 g via INTRAVENOUS
  Filled 2017-11-08: qty 50

## 2017-11-08 MED ORDER — SODIUM CHLORIDE 0.9 % IV SOLN
1.0000 g | INTRAVENOUS | Status: DC
  Administered 2017-11-09 (×2): 1 g via INTRAVENOUS
  Filled 2017-11-08 (×3): qty 10

## 2017-11-08 MED ORDER — APIXABAN 2.5 MG PO TABS
2.5000 mg | ORAL_TABLET | Freq: Two times a day (BID) | ORAL | Status: DC
  Administered 2017-11-09 (×2): 2.5 mg via ORAL
  Filled 2017-11-08 (×2): qty 1

## 2017-11-08 MED ORDER — ACETAMINOPHEN 650 MG RE SUPP: 650.0000 mg | Freq: Four times a day (QID) | RECTAL | Status: DC | PRN

## 2017-11-08 MED ORDER — PANTOPRAZOLE SODIUM 40 MG PO TBEC
40.0000 mg | DELAYED_RELEASE_TABLET | Freq: Every day | ORAL | Status: DC
  Administered 2017-11-09 – 2017-11-15 (×7): 40 mg via ORAL
  Filled 2017-11-08 (×7): qty 1

## 2017-11-08 MED ORDER — GABAPENTIN 300 MG PO CAPS
300.0000 mg | ORAL_CAPSULE | Freq: Two times a day (BID) | ORAL | Status: DC
  Administered 2017-11-09 – 2017-11-15 (×14): 300 mg via ORAL
  Filled 2017-11-08 (×15): qty 1

## 2017-11-08 MED ORDER — LEVOTHYROXINE SODIUM 75 MCG PO TABS
75.0000 ug | ORAL_TABLET | Freq: Every day | ORAL | Status: DC
Start: 2017-11-09 — End: 2017-11-16
  Administered 2017-11-09 – 2017-11-15 (×7): 75 ug via ORAL
  Filled 2017-11-08 (×7): qty 1

## 2017-11-08 MED ORDER — ONDANSETRON HCL 4 MG PO TABS: 4.0000 mg | ORAL_TABLET | Freq: Four times a day (QID) | ORAL | Status: DC | PRN

## 2017-11-08 MED ORDER — VITAMIN E 45 MG (100 UNIT) PO CAPS
100.0000 [IU] | ORAL_CAPSULE | Freq: Every day | ORAL | Status: DC
  Administered 2017-11-09 – 2017-11-14 (×7): 100 [IU] via ORAL
  Filled 2017-11-08 (×10): qty 1

## 2017-11-08 MED ORDER — PIPERACILLIN-TAZOBACTAM 3.375 G IVPB: 3.3750 g | Freq: Two times a day (BID) | INTRAVENOUS | Status: DC

## 2017-11-08 MED ORDER — SODIUM CHLORIDE 0.9 % IV SOLN
1000.0000 mL | INTRAVENOUS | Status: DC
  Administered 2017-11-08: 1000 mL via INTRAVENOUS

## 2017-11-08 MED ORDER — VITAMIN B-12 100 MCG PO TABS
100.0000 ug | ORAL_TABLET | Freq: Every day | ORAL | Status: DC
  Administered 2017-11-09 – 2017-11-15 (×8): 100 ug via ORAL
  Filled 2017-11-08 (×8): qty 1

## 2017-11-08 MED ORDER — ACETAMINOPHEN 325 MG PO TABS
650.0000 mg | ORAL_TABLET | Freq: Four times a day (QID) | ORAL | Status: DC | PRN
  Administered 2017-11-09 – 2017-11-15 (×13): 650 mg via ORAL
  Filled 2017-11-08 (×13): qty 2

## 2017-11-08 MED ORDER — SENNOSIDES-DOCUSATE SODIUM 8.6-50 MG PO TABS: 1.0000 | ORAL_TABLET | Freq: Every evening | ORAL | Status: DC | PRN

## 2017-11-08 MED ORDER — TRIAZOLAM 0.125 MG PO TABS
0.1250 mg | ORAL_TABLET | Freq: Every evening | ORAL | Status: DC | PRN
  Administered 2017-11-09: 0.125 mg via ORAL
  Filled 2017-11-08 (×3): qty 1

## 2017-11-08 MED ORDER — OMEGA-3-ACID ETHYL ESTERS 1 G PO CAPS
2.0000 g | ORAL_CAPSULE | Freq: Every day | ORAL | Status: DC
  Administered 2017-11-09 – 2017-11-15 (×7): 2 g via ORAL
  Filled 2017-11-08 (×7): qty 2

## 2017-11-08 MED ORDER — PREDNISONE 10 MG PO TABS
10.0000 mg | ORAL_TABLET | Freq: Every day | ORAL | Status: DC
  Filled 2017-11-08: qty 1

## 2017-11-08 NOTE — ED Provider Notes (Addendum)
[facility] EMERGENCY DEPARTMENT Provider Note   CSN: [id number] Arrival date & time: 11/08/17  1302     History   Chief Complaint Chief Complaint  Patient presents with  . Hypotension  . Shortness of Breath    HPI Levi Lewis is a 84 y.o. male.  HPI Pt states he has not been feeling well the last few days.  His arm started to swell on Sunday.  He figured it was his gout and started his gout medicine. He has had decreased urine output the past few days.  Just small amounts. He feels weak all over and gets short of breath when doing any activity.  He went to his doctor today who told him to come to the ED after having a low blood pressure in the office. NO blood in the stool.  No fevers.  No chest pain.  Past Medical History:  Diagnosis Date  . Aortic insufficiency   . Arthritis    "back, knees, right leg" (03/15/2016)  . Atrial fibrillation ([place])   . CAD (coronary artery disease)   . Cardiomyopathy, EF by echo 25-30% 07/14/2013  . CHF (congestive heart failure) ([place])   . Chronic lower back pain   . COPD (chronic obstructive pulmonary disease) ([place])   . Dyslipidemia   . GERD (gastroesophageal reflux disease)   . Gout   . Hypertension   . Hypothyroidism   . Left bundle branch block   . Lung nodules   . Mitral regurgitation   . On home oxygen therapy    "2L; 24/7 for the last week" (03/15/2016)  . Pacemaker   . Pneumonia    "2-3 times" (03/15/2016)  . RBBB   . Shortness of breath   . Shoulder pain    left; limited ROM  . Skin cancer    "left side of my nose; had another one maybe off my back" (03/15/2016)    Patient Active Problem List   Diagnosis Date Noted  . Acute on chronic systolic HF (heart failure) ([place]) 03/15/2016  . Acute on chronic systolic CHF (congestive heart failure) ([place])   . Acute hypoxemic respiratory failure ([place]) 10/18/2015  . Fall 10/18/2015  . Suprapubic pain 10/23/2013  . HCAP (healthcare-associated pneumonia)  10/20/2013  . Sepsis ([place]) 10/19/2013  . Hypotension 10/19/2013  . Fatigue 10/15/2013  . Multiple blisters 09/03/2013  . Hypothyroidism 08/05/2013  . Pacemaker 08/04/2013  . Chronic systolic CHF (congestive heart failure) ([place]) 08/04/2013  . HX: anticoagulation 08/04/2013  . CKD (chronic kidney disease) stage 4, GFR 15-29 ml/min (HCC) 07/14/2013  . Cardiomyopathy, EF by echo 25-30% 07/14/2013  . Atrial fibrillation ([place]) 07/13/2013  . Alternating (bilateral) bundle branch block 07/13/2013  . Back pain   . Shoulder pain   . Left bundle branch block   . CAD (coronary artery disease)   . Hypertension   . Gout   . Dyslipidemia   . Ejection fraction   . Lung nodules   . COPD (chronic obstructive pulmonary disease) ([place])   . Mitral regurgitation   . Aortic insufficiency     Past Surgical History:  Procedure Laterality Date  . BACK SURGERY    . BI-VENTRICULAR PACEMAKER INSERTION (CRT-P)  07-15-2013   STJ Quadra Allura CRTP implanted by Dr Caryl Comes for NICM, CHF, alternating bundle branch blocks  . CARDIOVERSION N/A 09/13/2013   Procedure: CARDIOVERSION;  Surgeon: Carlena Bjornstad, MD;  Location: [facility] ENDOSCOPY;  Service: Cardiovascular;  Laterality: N/A;  . CARDIOVERSION N/A 10/17/2013  Procedure: CARDIOVERSION;  Surgeon: Lelon Perla, MD;  Location: [facility] ENDOSCOPY;  Service: Cardiovascular;  Laterality: N/A;  . CARDIOVERSION N/A 07/04/2016   Procedure: CARDIOVERSION;  Surgeon: Sanda Klein, MD;  Location: [facility] ENDOSCOPY;  Service: Cardiovascular;  Laterality: N/A;  . CATARACT EXTRACTION W/ INTRAOCULAR LENS  IMPLANT, BILATERAL Bilateral   . CORONARY ANGIOPLASTY    . INSERT / REPLACE / REMOVE PACEMAKER    . KNEE ARTHROSCOPY Right   . LUMBAR DISC SURGERY    . PERMANENT PACEMAKER INSERTION N/A 07/15/2013   Procedure: PERMANENT PACEMAKER INSERTION;  Surgeon: Deboraha Sprang, MD;  Location: [facility] CATH LAB;  Service: Cardiovascular;  Laterality: N/A;  . SHOULDER ARTHROSCOPY Right   . THROAT  SURGERY  ~ 1950   "exploratory; felt like I had something in my throat & they went in to look around"  . TONSILLECTOMY          Home Medications    Prior to Admission medications   Medication Sig Start Date End Date Taking? Authorizing Provider  apixaban (ELIQUIS) 2.5 MG TABS tablet Take 2.5 mg by mouth 2 (two) times daily.    [provider]  carvedilol (COREG) 6.25 MG tablet TAKE 1 TABLET (6.25 MG TOTAL) BY MOUTH 2 (TWO) TIMES DAILY WITH A MEAL. 07/31/17   Consuelo Pandy, PA-C  etodolac (LODINE) 400 MG tablet Take 400 mg by mouth as needed (for gout).  06/01/17   [provider]  fenofibrate (TRICOR) 48 MG tablet TAKE 1/2 TABLET BY MOUTH EVERY DAY 10/25/17   Jerline Pain, MD  fish oil-omega-3 fatty acids 1000 MG capsule Take 2 g by mouth daily.     [provider]  gabapentin (NEURONTIN) 300 MG capsule Take 300 mg by mouth 2 (two) times daily. 09/09/15   [provider]  guaiFENesin (MUCINEX) 600 MG 12 hr tablet Take 600 mg by mouth 2 (two) times daily as needed for cough or to loosen phlegm (COLDS).     [provider]  KLOR-CON M20 20 MEQ tablet TAKE 1 TABLET BY MOUTH EVERY DAY 08/15/17   Jerline Pain, MD  levothyroxine (SYNTHROID, LEVOTHROID) 75 MCG tablet Take 1 tablet (75 mcg total) by mouth daily. 04/21/14   Carlena Bjornstad, MD  Multiple Vitamins-Minerals (OCUVITE PO) Take 2 tablets by mouth daily.     [provider]  omeprazole (PRILOSEC) 20 MG capsule Take 20 mg by mouth daily.     [provider]  predniSONE (DELTASONE) 10 MG tablet Take 10 mg by mouth daily.  09/04/15   [provider]  tamsulosin (FLOMAX) 0.4 MG CAPS capsule Take 0.4 mg by mouth 2 (two) times daily.     [provider]  torsemide (DEMADEX) 20 MG tablet Take 4 tablets (80 mg total) by mouth daily. 07/05/17   Deboraha Sprang, MD  triazolam (HALCION) 0.25 MG tablet Take 0.5 tablets (0.125 mg total) by mouth at bedtime as needed for  sleep. 10/24/13   Delfina Redwood, MD  ULORIC 40 MG tablet Take 40 mg by mouth daily. 04/27/17   [provider]  vitamin B-12 (CYANOCOBALAMIN) 100 MCG tablet Take 100 mcg by mouth daily.    [provider]  vitamin E 100 UNIT capsule Take 100 Units by mouth daily.    [provider]    Family History Family History  Problem Relation Age of Onset  . Heart attack Sister   . Lung cancer Brother  smoker  . Colon cancer Neg Hx     Social History Social History   Tobacco Use  . Smoking status: Never Smoker  . Smokeless tobacco: Never Used  Substance Use Topics  . Alcohol use: No    Alcohol/week: 0.0 oz  . Drug use: No     Allergies   Patient has no known allergies.   Review of Systems Review of Systems  Musculoskeletal:       Swelling and pain right forearm  All other systems reviewed and are negative.    Physical Exam Updated Vital Signs BP (!) 83/42 (BP Location: Left Arm)   Pulse 71   Temp 97.6 F (36.4 C) (Oral)   Resp 16   SpO2 97%   Physical Exam  Constitutional: No distress.  Elderly frail  HENT:  Head: Normocephalic and atraumatic.  Right Ear: External ear normal.  Left Ear: External ear normal.  Eyes: Conjunctivae are normal. Right eye exhibits no discharge. Left eye exhibits no discharge. No scleral icterus.  Neck: Neck supple. No tracheal deviation present.  Cardiovascular: Normal rate, regular rhythm and intact distal pulses.  Pulmonary/Chest: Effort normal and breath sounds normal. No stridor. No respiratory distress. He has no wheezes. He has no rales.  Abdominal: Soft. Bowel sounds are normal. He exhibits no distension. There is no tenderness. There is no rebound and no guarding.  Musculoskeletal: He exhibits no edema or tenderness.  Edema and erythema  right forearm and hand  Neurological: He is alert. He has normal strength. No cranial nerve deficit (no facial droop, extraocular movements intact, no slurred  speech) or sensory deficit. He exhibits normal muscle tone. He displays no seizure activity. Coordination normal.  Skin: Skin is warm and dry. No rash noted.  Psychiatric: He has a normal mood and affect.  Nursing note and vitals reviewed.    ED Treatments / Results  Labs (all labs ordered are listed, but only abnormal results are displayed) Labs Reviewed  BASIC METABOLIC PANEL - Abnormal; Notable for the following components:      Result Value   Potassium 5.2 (*)    BUN 63 (*)    Creatinine, Ser 3.27 (*)    Calcium 8.5 (*)    GFR calc non Af Amer 14 (*)    GFR calc Af Amer 17 (*)    All other components within normal limits  CBC - Abnormal; Notable for the following components:   WBC 14.4 (*)    RBC 3.69 (*)    Hemoglobin 11.7 (*)    HCT 37.3 (*)    MCV 101.1 (*)    All other components within normal limits  BASIC METABOLIC PANEL - Abnormal; Notable for the following components:   CO2 21 (*)    Glucose, Bld 145 (*)    BUN 59 (*)    Creatinine, Ser 3.26 (*)    Calcium 7.9 (*)    GFR calc non Af Amer 14 (*)    GFR calc Af Amer 17 (*)    All other components within normal limits  CBC - Abnormal; Notable for the following components:   WBC 12.6 (*)    RBC 3.74 (*)    Hemoglobin 12.0 (*)    HCT 37.7 (*)    MCV 100.8 (*)    All other components within normal limits  URINALYSIS, ROUTINE W REFLEX MICROSCOPIC  CORTISOL  I-STAT TROPONIN, ED  I-STAT CG4 LACTIC ACID, ED  I-STAT CG4 LACTIC ACID, ED    EKG  EKG Interpretation  Date/Time:  Wednesday November 08 2017 13:23:02 EDT Ventricular Rate:  75 PR Interval:    QRS Duration: 162 QT Interval:  490 QTC Calculation: 547 R Axis:   145 Text Interpretation:  Ventricular-paced rhythm Abnormal ECG No significant change since last tracing Confirmed by Dorie Rank [phone]) on 11/08/2017 6:13:55 PM   Radiology Dg Chest 2 View  Result Date: 11/08/2017 CLINICAL DATA:  Shortness of breath.  Hypotension. EXAM: CHEST - 2 VIEW  COMPARISON:  03/16/2016 and 12/03/2015 FINDINGS: Chronic cardiomegaly. Triple lead pacemaker in place. Slight pulmonary vascular redistribution to the upper lobes without pulmonary edema. The lungs are hyperinflated with flattening of the diaphragm. No definitive effusions. No acute bone abnormality. IMPRESSION: 1. Chronic cardiomegaly. 2.  Emphysema (ICD10-J43.9). 3. Slight pulmonary vascular congestion. Electronically Signed   By: Lorriane Shire M.D.   On: 11/08/2017 14:03    Procedures .Critical Care Performed by: Dorie Rank, MD Authorized by: Dorie Rank, MD   Critical care provider statement:    Critical care time (minutes):  45   Critical care was time spent personally by me on the following activities:  Discussions with consultants, evaluation of patient's response to treatment, examination of patient, ordering and performing treatments and interventions, ordering and review of laboratory studies, ordering and review of radiographic studies, pulse oximetry, re-evaluation of patient's condition, obtaining history from patient or surrogate and review of old charts   (including critical care time)  Medications Ordered in ED Medications - No data to display   Initial Impression / Assessment and Plan / ED Course  I have reviewed the triage vital signs and the nursing notes.  Pertinent labs & imaging results that were available during my care of the patient were reviewed by me and considered in my medical decision making (see chart for details).  Clinical Course as of Nov 10 850  Wed Nov 08, 2017  1814 BP has improved with fluids.  Labs notable for acute kidney injury.  Will order additional fluid bolus   [JK]    Clinical Course User Index [JK] Dorie Rank, MD    Pt presented to the ED for evaluation of arm swelling and hypotension.  Pt noted to have AKI.  Responded to fluid boluses although still remained borderline low.  Noted to have elevated WBC, no lactic acidosis, concerning for  cellulitis rue without signs of severe sepsis.  Started on abx.  Admitted for further treatment.  Final Clinical Impressions(s) / ED Diagnoses   Final diagnoses:  AKI (acute kidney injury) ([place])  Dehydration  Hypotension, unspecified hypotension type  Cellulitis of right upper extremity      Dorie Rank, MD 11/09/17 8101 Rich Brave, MD 11/21/17 [phone]

## 2017-11-08 NOTE — H&P (Signed)
History and Physical    Levi Lewis ZTI:[id number] DOB: 1918/03/19 DOA: 11/08/2017  PCP: [name], MD  Patient coming from: home  I have personally briefly reviewed patient's old medical records in [place]  Chief Complaint: low BP  HPI: Levi Lewis is a 84 y.o. male with medical history significant history of CHF presents with hypotension.  Patient takes his weight daily and his blood pressure daily.  Found to be hypotensive this morning.  He felt weak and dizzy and went to see his primary care provider.  By report blood pressure was low there as well and he was  referred ver to the ED.  Blood pressure was in the 80s here.  He was given 1.5  L IV fluids in the ED with improvement of his blood pressure with a map now of 70.  Family does not think he has been drinking very much the past few days.  He has been sitting on the porch in the heat a lot.  He lives at home alone.  His wife died 3 weeks ago but there is lots of family who  checks on him daily but he does stay at home at night alone.  Family that is present think  he has been taking his medications based on his  pillbox.  He States his weight is up about 6 pounds.  Patient also noticed that his right arm is swollen  for the past few days.  He thought it was gout has been taking and prescription NSAIDS for gout without relief ED Course: Patient was afebrile here.  He did have a white count of 14,000.  He was found to be in acute on chronic renal failure with a creatinine 3.2 today baseline around 2.  He is on chronic prednisone unsure of exactly why .  Patient's UA and chest x-ray were benign.  Patient was treated empirically with IV Zosyn for a questionable right arm cellulitis.  Review of Systems: Admits to right arm swelling increase dry weight dizziness denies chest pain fever cough positive for decreased urine output All others reviewed and are otherwise negative other than those mentioned above per HPI  Past Medical  History:  Diagnosis Date  . Aortic insufficiency   . Arthritis    "back, knees, right leg" (03/15/2016)  . Atrial fibrillation ([place])   . CAD (coronary artery disease)   . Cardiomyopathy, EF by echo 25-30% 07/14/2013  . CHF (congestive heart failure) ([place])   . Chronic lower back pain   . COPD (chronic obstructive pulmonary disease) ([place])   . Dyslipidemia   . GERD (gastroesophageal reflux disease)   . Gout   . Hypertension   . Hypothyroidism   . Left bundle branch block   . Lung nodules   . Mitral regurgitation   . On home oxygen therapy    "2L; 24/7 for the last week" (03/15/2016)  . Pacemaker   . Pneumonia    "2-3 times" (03/15/2016)  . RBBB   . Shortness of breath   . Shoulder pain    left; limited ROM  . Skin cancer    "left side of my nose; had another one maybe off my back" (03/15/2016)    Past Surgical History:  Procedure Laterality Date  . BACK SURGERY    . BI-VENTRICULAR PACEMAKER INSERTION (CRT-P)  07-15-2013   STJ Quadra Allura CRTP implanted by Dr Caryl Comes for NICM, CHF, alternating bundle branch blocks  . CARDIOVERSION N/A 09/13/2013   Procedure:  CARDIOVERSION;  Surgeon: Carlena Bjornstad, MD;  Location: [facility];  Service: Cardiovascular;  Laterality: N/A;  . CARDIOVERSION N/A 10/17/2013   Procedure: CARDIOVERSION;  Surgeon: Lelon Perla, MD;  Location: [facility];  Service: Cardiovascular;  Laterality: N/A;  . CARDIOVERSION N/A 07/04/2016   Procedure: CARDIOVERSION;  Surgeon: Sanda Klein, MD;  Location: [facility] ENDOSCOPY;  Service: Cardiovascular;  Laterality: N/A;  . CATARACT EXTRACTION W/ INTRAOCULAR LENS  IMPLANT, BILATERAL Bilateral   . CORONARY ANGIOPLASTY    . INSERT / REPLACE / REMOVE PACEMAKER    . KNEE ARTHROSCOPY Right   . LUMBAR DISC SURGERY    . PERMANENT PACEMAKER INSERTION N/A 07/15/2013   Procedure: PERMANENT PACEMAKER INSERTION;  Surgeon: Deboraha Sprang, MD;  Location: [facility] CATH LAB;  Service: Cardiovascular;  Laterality: N/A;  . SHOULDER  ARTHROSCOPY Right   . THROAT SURGERY  ~ 1950   "exploratory; felt like I had something in my throat & they went in to look around"  . TONSILLECTOMY       reports that he has never smoked. He has never used smokeless tobacco. He reports that he does not drink alcohol or use drugs.  No Known Allergies  Family History  Problem Relation Age of Onset  . Heart attack Sister   . Lung cancer Brother        smoker  . Colon cancer Neg Hx   Reviewed and noncontributory   Prior to Admission medications   Medication Sig Start Date End Date Taking? Authorizing Provider  acetaminophen (TYLENOL) 325 MG tablet Take 650 mg by mouth every 6 (six) hours as needed for mild pain, fever or headache.   Yes [provider]  apixaban (ELIQUIS) 2.5 MG TABS tablet Take 2.5 mg by mouth 2 (two) times daily.   Yes [provider]  carvedilol (COREG) 6.25 MG tablet TAKE 1 TABLET (6.25 MG TOTAL) BY MOUTH 2 (TWO) TIMES DAILY WITH A MEAL. 07/31/17  Yes Lyda Jester M, PA-C  etodolac (LODINE) 400 MG tablet Take 400 mg by mouth as needed (for gout).  06/01/17  Yes [provider]  fenofibrate (TRICOR) 48 MG tablet TAKE 1/2 TABLET BY MOUTH EVERY DAY 10/25/17  Yes Jerline Pain, MD  fish oil-omega-3 fatty acids 1000 MG capsule Take 2 g by mouth daily.    Yes [provider]  gabapentin (NEURONTIN) 300 MG capsule Take 300 mg by mouth 2 (two) times daily. 09/09/15  Yes [provider]  guaiFENesin (MUCINEX) 600 MG 12 hr tablet Take 600 mg by mouth 2 (two) times daily as needed for cough or to loosen phlegm (COLDS).    Yes [provider]  KLOR-CON M20 20 MEQ tablet TAKE 1 TABLET BY MOUTH EVERY DAY 08/15/17  Yes Jerline Pain, MD  levothyroxine (SYNTHROID, LEVOTHROID) 75 MCG tablet Take 1 tablet (75 mcg total) by mouth daily. 04/21/14  Yes Carlena Bjornstad, MD  Multiple Vitamins-Minerals (OCUVITE PO) Take 2 tablets by mouth daily.    Yes [provider]    omeprazole (PRILOSEC) 20 MG capsule Take 20 mg by mouth daily.    Yes [provider]  predniSONE (DELTASONE) 10 MG tablet Take 10 mg by mouth daily.  09/04/15  Yes [provider]  tamsulosin (FLOMAX) 0.4 MG CAPS capsule Take 0.4 mg by mouth 2 (two) times daily.    Yes [provider]  torsemide (DEMADEX) 20 MG tablet Take 4 tablets (80 mg total) by mouth daily. 07/05/17  Yes Caryl Comes,  Revonda Standard, MD  triazolam (HALCION) 0.25 MG tablet Take 0.5 tablets (0.125 mg total) by mouth at bedtime as needed for sleep. 10/24/13  Yes Delfina Redwood, MD  ULORIC 40 MG tablet Take 40 mg by mouth daily. 04/27/17  Yes [provider]  vitamin B-12 (CYANOCOBALAMIN) 100 MCG tablet Take 100 mcg by mouth daily.   Yes [provider]  vitamin E 100 UNIT capsule Take 100 Units by mouth daily.   Yes [provider]    Physical Exam: Vitals:   11/08/17 1900 11/08/17 2030 11/08/17 2100 11/08/17 2115  BP: 92/63 (!) 97/54 (!) 95/53 (!) 86/49  Pulse: 67 73 79 68  Resp: 20 19  (!) 21  Temp:      TempSrc:      SpO2: 98% 96% 94% 96%  Weight:      Height:        Constitutional: NAD, calm, comfortable Vitals:   11/08/17 1900 11/08/17 2030 11/08/17 2100 11/08/17 2115  BP: 92/63 (!) 97/54 (!) 95/53 (!) 86/49  Pulse: 67 73 79 68  Resp: 20 19  (!) 21  Temp:      TempSrc:      SpO2: 98% 96% 94% 96%  Weight:      Height:       Eyes: PERRL, lids and conjunctivae normal ENMT: Mucous membranes are moist. Posterior pharynx clear of any exudate or lesions.  Neck: normal, supple, no masses, no thyromegaly Respiratory: clear to auscultation bilaterally, no wheezing, no crackles. Normal respiratory effort. No accessory muscle use.  Cardiovascular: Regular rate and rhythm, no murmurs / rubs / gallops. 1+ LEE 1+ pedal pulses.  Abdomen: protuberant no tenderness, no masses palpated.  Bowel sounds positive.  Musculoskeletal: right arm edema, incr redness, mild incr warmth   2+ radial pulse bl LUE nml  Skin: scattered bruises  Neurologic: decr hearing. Moves all ext equally  Psychiatric: Normal judgment and insight. Alert and oriented x 3. Normal mood.   Labs on Admission: I have personally reviewed following labs and imaging studies  CBC: Recent Labs  Lab 11/08/17 1326  WBC 14.4*  HGB 11.7*  HCT 37.3*  MCV 101.1*  PLT 546   Basic Metabolic Panel: Recent Labs  Lab 11/08/17 1326  NA 137  K 5.2*  CL 104  CO2 22  GLUCOSE 88  BUN 63*  CREATININE 3.27*  CALCIUM 8.5*   GFR: Estimated Creatinine Clearance: 13 mL/min (A) (by C-G formula based on SCr of 3.27 mg/dL (H)). Liver Function Tests: No results for input(s): AST, ALT, ALKPHOS, BILITOT, PROT, ALBUMIN in the last 168 hours. No results for input(s): LIPASE, AMYLASE in the last 168 hours. No results for input(s): AMMONIA in the last 168 hours. Coagulation Profile: No results for input(s): INR, PROTIME in the last 168 hours. Cardiac Enzymes: No results for input(s): CKTOTAL, CKMB, CKMBINDEX, TROPONINI in the last 168 hours. BNP (last 3 results) No results for input(s): PROBNP in the last 8760 hours. HbA1C: No results for input(s): HGBA1C in the last 72 hours. CBG: No results for input(s): GLUCAP in the last 168 hours. Lipid Profile: No results for input(s): CHOL, HDL, LDLCALC, TRIG, CHOLHDL, LDLDIRECT in the last 72 hours. Thyroid Function Tests: No results for input(s): TSH, T4TOTAL, FREET4, T3FREE, THYROIDAB in the last 72 hours. Anemia Panel: No results for input(s): VITAMINB12, FOLATE, FERRITIN, TIBC, IRON, RETICCTPCT in the last 72 hours. Urine analysis:    Component Value Date/Time   COLORURINE YELLOW 11/08/2017 1646  APPEARANCEUR CLEAR 11/08/2017 1646   LABSPEC 1.006 11/08/2017 1646   PHURINE 5.0 11/08/2017 1646   GLUCOSEU NEGATIVE 11/08/2017 1646   HGBUR NEGATIVE 11/08/2017 1646   BILIRUBINUR NEGATIVE 11/08/2017 1646   KETONESUR NEGATIVE 11/08/2017 1646   PROTEINUR  NEGATIVE 11/08/2017 1646   UROBILINOGEN 1.0 02/19/2015 1815   NITRITE NEGATIVE 11/08/2017 1646   LEUKOCYTESUR NEGATIVE 11/08/2017 1646    Radiological Exams on Admission: Dg Chest 2 View  Result Date: 11/08/2017 CLINICAL DATA:  Shortness of breath.  Hypotension. EXAM: CHEST - 2 VIEW COMPARISON:  03/16/2016 and 12/03/2015 FINDINGS: Chronic cardiomegaly. Triple lead pacemaker in place. Slight pulmonary vascular redistribution to the upper lobes without pulmonary edema. The lungs are hyperinflated with flattening of the diaphragm. No definitive effusions. No acute bone abnormality. IMPRESSION: 1. Chronic cardiomegaly. 2.  Emphysema (ICD10-J43.9). 3. Slight pulmonary vascular congestion. Electronically Signed   By: Lorriane Shire M.D.   On: 11/08/2017 14:03     Assessment/Plan Principal Problem:   Hypotension Active Problems:   CAD (coronary artery disease)   Atrial fibrillation (HCC)   Pacemaker   Chronic systolic CHF (congestive heart failure) (HCC)   Acute on chronic renal failure (HCC)   Arm swelling right   - inpt admit, gentle IVF< repeat bmp in am, . 250 cc obtained with straight cath in ED . Suspect intravascular vol depletion. Cont home Flomax. UA benign-hold  home demadex and antihypertensive tonight, , follow ins and outs and daily weights, restrict fluids. -[place] right arm pending ? Cellulitis, Iv Rocephin, supportive care   .   Cont home Eliquis  DVT prophylaxis: Eliquis/SCD Code Status: Full confirm with family in AM  Disposition Plan: suspect d/c home 2-3  days  Admission status: inpt tele   Jazmyne Beauchesne Johnson-Pitts MD [facility] Pager [phone]  If 7PM-7AM, please contact night-coverage [email or web address] Password [facility]  11/08/2017, 10:24 PM

## 2017-11-08 NOTE — ED Notes (Signed)
PAGED ADMITTING TO JULIE, RN

## 2017-11-08 NOTE — ED Notes (Signed)
Pt given crackers, peanut butter and water per Julie(RN)

## 2017-11-08 NOTE — ED Triage Notes (Signed)
Pt reports recent urinary retention. Woke up with sob this am. Had recent pain, swelling and redness to right hand. Has hx of gout. Denies n/v, fever or recent cough.

## 2017-11-09 ENCOUNTER — Inpatient Hospital Stay (HOSPITAL_COMMUNITY): Payer: Medicare Other

## 2017-11-09 ENCOUNTER — Encounter (HOSPITAL_COMMUNITY): Payer: Self-pay | Admitting: General Practice

## 2017-11-09 DIAGNOSIS — I2510 Atherosclerotic heart disease of native coronary artery without angina pectoris: Secondary | ICD-10-CM

## 2017-11-09 DIAGNOSIS — I480 Paroxysmal atrial fibrillation: Secondary | ICD-10-CM

## 2017-11-09 DIAGNOSIS — N184 Chronic kidney disease, stage 4 (severe): Secondary | ICD-10-CM

## 2017-11-09 DIAGNOSIS — N170 Acute kidney failure with tubular necrosis: Secondary | ICD-10-CM

## 2017-11-09 DIAGNOSIS — M7989 Other specified soft tissue disorders: Secondary | ICD-10-CM

## 2017-11-09 DIAGNOSIS — I5022 Chronic systolic (congestive) heart failure: Secondary | ICD-10-CM

## 2017-11-09 DIAGNOSIS — I950 Idiopathic hypotension: Secondary | ICD-10-CM

## 2017-11-09 DIAGNOSIS — Z950 Presence of cardiac pacemaker: Secondary | ICD-10-CM

## 2017-11-09 LAB — BASIC METABOLIC PANEL
Anion gap: 11 (ref 5–15)
BUN: 59 mg/dL — ABNORMAL HIGH (ref 8–23)
CO2: 21 mmol/L — ABNORMAL LOW (ref 22–32)
Calcium: 7.9 mg/dL — ABNORMAL LOW (ref 8.9–10.3)
Chloride: 104 mmol/L (ref 98–111)
Creatinine, Ser: 3.26 mg/dL — ABNORMAL HIGH (ref 0.61–1.24)
GFR calc Af Amer: 17 mL/min — ABNORMAL LOW (ref >60)
GFR calc non Af Amer: 14 mL/min — ABNORMAL LOW (ref >60)
Glucose, Bld: 145 mg/dL — ABNORMAL HIGH (ref 70–99)
Potassium: 4.5 mmol/L (ref 3.5–5.1)
Sodium: 136 mmol/L (ref 135–145)

## 2017-11-09 LAB — CBC
HCT: 37.7 % — ABNORMAL LOW (ref 39.0–52.0)
Hemoglobin: 12 g/dL — ABNORMAL LOW (ref 13.0–17.0)
MCH: 32.1 pg (ref 26.0–34.0)
MCHC: 31.8 g/dL (ref 30.0–36.0)
MCV: 100.8 fL — ABNORMAL HIGH (ref 78.0–100.0)
Platelets: 212 10*3/uL (ref 150–400)
RBC: 3.74 MIL/uL — ABNORMAL LOW (ref 4.22–5.81)
RDW: 14.6 % (ref 11.5–15.5)
WBC: 12.6 10*3/uL — ABNORMAL HIGH (ref 4.0–10.5)

## 2017-11-09 LAB — CORTISOL: Cortisol, Plasma: 6.2 ug/dL

## 2017-11-09 MED ORDER — HYDROCORTISONE NA SUCCINATE PF 100 MG IJ SOLR
50.0000 mg | Freq: Three times a day (TID) | INTRAMUSCULAR | Status: DC
  Administered 2017-11-09 – 2017-11-10 (×4): 50 mg via INTRAVENOUS
  Filled 2017-11-09 (×4): qty 2

## 2017-11-09 MED ORDER — SODIUM CHLORIDE 0.9 % IV SOLN
INTRAVENOUS | Status: DC
  Administered 2017-11-09: 500 mL via INTRAVENOUS

## 2017-11-09 MED ORDER — ORAL CARE MOUTH RINSE
15.0000 mL | Freq: Two times a day (BID) | OROMUCOSAL | Status: DC
  Administered 2017-11-10 – 2017-11-15 (×9): 15 mL via OROMUCOSAL

## 2017-11-09 MED ORDER — SODIUM CHLORIDE 0.9 % IV BOLUS
500.0000 mL | Freq: Once | INTRAVENOUS | Status: AC
  Administered 2017-11-09: 500 mL via INTRAVENOUS

## 2017-11-09 NOTE — Progress Notes (Signed)
Bladder scanned the patient and view 643 mL. Retrieved 700 mL of urine from the in and out cath.   Will continue to monitor the patient.  Notify as needed.

## 2017-11-09 NOTE — Progress Notes (Signed)
Patient BP 84/57 after the 500 mL bolus was given.  Notified NP Tylene Fantasia.  Will continue to monitor the patient

## 2017-11-09 NOTE — Progress Notes (Signed)
*  Preliminary Results* Right upper extremity venous duplex completed. Right upper extremity is negative for deep and superficial vein thrombosis.  11/09/2017 10:04 AM  Levi Lewis

## 2017-11-09 NOTE — Progress Notes (Signed)
PROGRESS NOTE  Levi Lewis  ENI:[id number] DOB: 1917/09/21 DOA: 11/08/2017 PCP: [name], MD  Outpatient Specialists: Cardiology, [facility] Brief Narrative: Levi Lewis is a 84 y.o. male with a history of chronic HFrEF, gout/arthritis on chronic prednisone, persistent AFib s/p BiV PPM and stage III CKD who presented to the ED about 3 weeks following the death of his wife of 47 years with right arm pain, swelling and low blood pressure associated with generalized weakness. WBC 14k, Cr up to 3.2 from baseline of 2, UA and CXR without acute infection. He was admitted for dehydration causing hypotension with some improvement after IV fluids and given ceftriaxone for right arm cellulitis. U/S ruled out DVT/SVT. He also required I/O cath for urinary retention which is resolved.   Assessment & Plan: Principal Problem:   Hypotension Active Problems:   CAD (coronary artery disease)   Atrial fibrillation (HCC)   Pacemaker   Chronic systolic CHF (congestive heart failure) (HCC)   Acute on chronic renal failure (HCC)   Arm swelling right  Hypotension: Possibly related to cellulitis with relative adrenal insufficiency vs. heart failure. No evidence of bleeding. Note low-normal BPs as outpatient. Leukocytosis not likely to be reliable in setting of steroids. - Cardiology consulted in light of cardiac history, appreciate their recommendations.  - Having some increase leg swelling, vascular congestion on CXR, so will decrease IVF's, continue to home anti-HTN and diuretic. - Check TSH.  - Random cortisol was borderline, hyperkalemia (due to relative mineralocorticoid deficiency vs. AKI), so will start stress-dose steroids with low threshold to taper if not improving.   Right arm cellulitis: No evidence of clot on U/S. Exam is not consistent with gout as primary process, though will be starting stress steroids regardless.  - Continue ceftriaxone (no purulent component) - Elevate right arm - No blood  cultures drawn at admission, would be low yield now.  AKI on stage III CKD: Suspected to be due to intravascular volume depletion, though not appreciably improved with IV fluids. Possibly hemodynamically mediated with ongoing hypotension, as well as due to NSAID nephropathy (taking etodolac multiple times/day for right arm pain), possibly urinary retention as well and ?cardiorenal.  - Stop eliquis. May consider coumadin pending renal function vs. hold anticoagulation.  - Renal U/S - Monitor Cr and UOP  Acute urinary retention:  - Will hold flomax with hypotension. Will have to monitor closely UOP with condom catheter, bladder scan prn. If hydronephrosis on U/S, will insert foley.  Chronic systolic CHF: EF 61-44% on last check.  - Cardiology feels that even if this is low cardiac output process, wouldn't be candidate for advanced therapies. I will continue palliative care discussions tomorrow.   AFib:  - Monitor HR while holding beta blocker - Stop eliquis with significant risk of bleeding and current renal failure  Grief reaction: Appropriate bereavement with wife of 68 years passing earlier this month, currently lives alone. I echo cardiology's ?broken heart syndrome.   Chronic hypoxic respiratory failure:  - Continue oxygen  Gout: Chronic.  - Continue febuxostat (continued from outpatient setting), can discuss with patient the recent findings of CV mortality with this drug.  - Stop prednisone, will need to taper  Hypothyroidism:  - continue synthroid  GERD:  - PPI  DVT prophylaxis: SCDs Code Status: Full Family Communication: Son at bedside Disposition Plan: Uncertain. PT to be consulted pending improvement in BP.   Consultants:   Cardiology  Procedures:   None  Antimicrobials:  Zosyn 7/24  Ceftriaxone  7/24 >>    Subjective: Right arm tenderness is major complaint. Has not gotten up, no dizziness in bed. No chest pain or dyspnea or fever.  Objective: Vitals:     11/09/17 0115 11/09/17 0255 11/09/17 0500 11/09/17 0554  BP: (!) 78/46 (!) 84/57  (!) 83/49  Pulse:  74  69  Resp:    18  Temp:    (!) 97.4 F (36.3 C)  TempSrc:    Oral  SpO2:    94%  Weight:   80.7 kg (178 lb)   Height:        Intake/Output Summary (Last 24 hours) at 11/09/2017 1400 Last data filed at 11/09/2017 6606 Gross per 24 hour  Intake 2401.55 ml  Output 1250 ml  Net 1151.55 ml   Filed Weights   11/08/17 1643 11/08/17 2314 11/09/17 0500  Weight: 81.6 kg (180 lb) 80.7 kg (178 lb) 80.7 kg (178 lb)    Gen: Elderly male in no distress Pulm: Non-labored breathing. Clear to auscultation bilaterally.  CV: Irreg. No murmur, rub, or gallop. No JVD, 1+ L>R pedal edema. GI: Abdomen soft, non-tender, non-distended, with normoactive bowel sounds. No organomegaly or masses felt. Ext: Warm, no deformities. Right knee with sleeve, no tenderness to palpation. Right hand with diffuse edema extending proximally to elbow without focal palpable joint effusion. AROM diminished by swelling, cap refill brisk, sensation intact.  Skin: As above. Otherwise no ulcers Neuro: Alert and oriented. No focal neurological deficits. Psych: Judgement and insight appear normal. Mood & affect appropriate.   Data Reviewed: I have personally reviewed following labs and imaging studies  CBC: Recent Labs  Lab 11/08/17 1326 11/09/17 0002  WBC 14.4* 12.6*  HGB 11.7* 12.0*  HCT 37.3* 37.7*  MCV 101.1* 100.8*  PLT 213 301   Basic Metabolic Panel: Recent Labs  Lab 11/08/17 1326 11/09/17 0002  NA 137 136  K 5.2* 4.5  CL 104 104  CO2 22 21*  GLUCOSE 88 145*  BUN 63* 59*  CREATININE 3.27* 3.26*  CALCIUM 8.5* 7.9*   GFR: Estimated Creatinine Clearance: 13.1 mL/min (A) (by C-G formula based on SCr of 3.26 mg/dL (H)). Liver Function Tests: No results for input(s): AST, ALT, ALKPHOS, BILITOT, PROT, ALBUMIN in the last 168 hours. No results for input(s): LIPASE, AMYLASE in the last 168 hours. No  results for input(s): AMMONIA in the last 168 hours. Coagulation Profile: No results for input(s): INR, PROTIME in the last 168 hours. Cardiac Enzymes: No results for input(s): CKTOTAL, CKMB, CKMBINDEX, TROPONINI in the last 168 hours. BNP (last 3 results) No results for input(s): PROBNP in the last 8760 hours. HbA1C: No results for input(s): HGBA1C in the last 72 hours. CBG: No results for input(s): GLUCAP in the last 168 hours. Lipid Profile: No results for input(s): CHOL, HDL, LDLCALC, TRIG, CHOLHDL, LDLDIRECT in the last 72 hours. Thyroid Function Tests: No results for input(s): TSH, T4TOTAL, FREET4, T3FREE, THYROIDAB in the last 72 hours. Anemia Panel: No results for input(s): VITAMINB12, FOLATE, FERRITIN, TIBC, IRON, RETICCTPCT in the last 72 hours. Urine analysis:    Component Value Date/Time   COLORURINE YELLOW 11/08/2017 [place] 11/08/2017 1646   LABSPEC 1.006 11/08/2017 1646   PHURINE 5.0 11/08/2017 1646   GLUCOSEU NEGATIVE 11/08/2017 1646   HGBUR NEGATIVE 11/08/2017 1646   BILIRUBINUR NEGATIVE 11/08/2017 1646   [place] 11/08/2017 1646   PROTEINUR NEGATIVE 11/08/2017 1646   UROBILINOGEN 1.0 02/19/2015 1815   NITRITE NEGATIVE 11/08/2017 1646  LEUKOCYTESUR NEGATIVE 11/08/2017 1646   No results found for this or any previous visit (from the past 240 hour(s)).    Radiology Studies: Dg Chest 2 View  Result Date: 11/08/2017 CLINICAL DATA:  Shortness of breath.  Hypotension. EXAM: CHEST - 2 VIEW COMPARISON:  03/16/2016 and 12/03/2015 FINDINGS: Chronic cardiomegaly. Triple lead pacemaker in place. Slight pulmonary vascular redistribution to the upper lobes without pulmonary edema. The lungs are hyperinflated with flattening of the diaphragm. No definitive effusions. No acute bone abnormality. IMPRESSION: 1. Chronic cardiomegaly. 2.  Emphysema (ICD10-J43.9). 3. Slight pulmonary vascular congestion. Electronically Signed   By: Lorriane Shire M.D.    On: 11/08/2017 14:03    Scheduled Meds: . febuxostat  40 mg Oral Daily  . fenofibrate  54 mg Oral Daily  . gabapentin  300 mg Oral BID  . hydrocortisone sodium succinate  50 mg Intravenous Q8H  . levothyroxine  75 mcg Oral QAC breakfast  . omega-3 acid ethyl esters  2 g Oral Daily  . pantoprazole  40 mg Oral Daily  . tamsulosin  0.4 mg Oral BID  . vitamin B-12  100 mcg Oral Daily  . vitamin E  100 Units Oral Daily   Continuous Infusions: . cefTRIAXone (ROCEPHIN)  IV 1 g (11/09/17 0108)     LOS: 1 day   Time spent: 25 minutes.  Patrecia Pour, MD [facility] [email or web address] Password [email or web address] 11/09/2017, 2:00 PM

## 2017-11-09 NOTE — Consult Note (Addendum)
Cardiology Consultation:   Patient ID: [id number]; [id number]; Sep 01, 1917   Admit date: 11/08/2017 Date of Consult: 11/09/2017  Primary Care Provider: [name], MD Primary Cardiologist: Candee Furbish, MD  Primary Electrophysiologist:  Dr. Caryl Comes  Patient Profile:   Levi Lewis is a 84 y.o. male with a hx of dilated cardiomyopathy, chronic systolic HF with EF of 01-02%, s/p Bi-V pacemaker followed by Dr. Caryl Comes, persistent atrial fibrillation w/ failed cardioversion 06/2016, chronic anticoagulation therapy with Eliquis, CKD and h/o CAD, who is being seen today for the evaluation of persistent hypotension, at the request of Dr. Bonner Puna, Internal Medicine.  History of Present Illness:   Levi Lewis is followed by Dr. Marlou Porch. Dr. Caryl Comes follows his Bi-V pacemaker. He has known dilated CM and chronic systolic HF with last echo in 2017 showing EF at 20-25%. He has h/o CAD and persistent atrial fibrillation. Cardioversion was attempted 06/2016 but failed. He is on chronic anticoagulation therapy w/ Eliquis. He has CKD, w/ baseline SCr in the 1.8-2.3 range over the last year.   His wife died 3 weeks ago. He lives home alone but has family close by checking in. Per H&P, his family noted on admit that he has not been eating and drinking very much the past few days. He also has been setting out in the heat on his front porch.   Pt presented to the ED on 11/08/17 with CC of weakness and dizziness. He was noted to be hypotensive in the ED with SBPs in the 80s. Also with AKI, w/ SCr at 3.27 baseline SCr in the 1.8-2.3 range over the last year. AKI felt to be pre-renal based on history of decreased PO intake and long hrs in the hot heat. Felt to be 2/2 intravascular vol depletion.   Pt also complained of right upper extremity swelling. Based on physical exam, internal medicine was concerned about possible cellulitis and started pt on IV antibiotics.   For his AKI and dehydration, he was started on IVFs. He got  a bolus of sodium chloride, 1,000 mL + continuous infusion at 125 mL/hr. His home meds including Coreg and torsemide have been on hold, but it appears he is still getting Flomax. Despite hydration, he remains hypotensive. SBPs in the 70s and 80s. His SCr has not changed, still at 3.2. Hgb is ok at 12. K normal at 4.5. WBC was elevated on admit at 14.4 but trending down with antibiotics, now at 12.6. His CA is low at 7.9. POC troponin in the ED was negative. UA also negative. He is not tachycardic.  Cardiology asked to assist with recommendations.      Past Medical History:  Diagnosis Date  . Aortic insufficiency   . Arthritis    "back, knees, right leg" (03/15/2016)  . Atrial fibrillation ([place])   . CAD (coronary artery disease)   . Cardiomyopathy, EF by echo 25-30% 07/14/2013  . CHF (congestive heart failure) ([place])   . Chronic lower back pain   . COPD (chronic obstructive pulmonary disease) ([place])   . Dyslipidemia   . GERD (gastroesophageal reflux disease)   . Gout   . Hypertension   . Hypothyroidism   . Left bundle branch block   . Lung nodules   . Mitral regurgitation   . On home oxygen therapy    "2L; 24/7 for the last week" (03/15/2016)  . Pacemaker   . Pneumonia    "2-3 times" (03/15/2016)  . RBBB   . Shortness of breath   .  Shoulder pain    left; limited ROM  . Skin cancer    "left side of my nose; had another one maybe off my back" (03/15/2016)    Past Surgical History:  Procedure Laterality Date  . BACK SURGERY    . BI-VENTRICULAR PACEMAKER INSERTION (CRT-P)  07-15-2013   STJ Quadra Allura CRTP implanted by Dr Caryl Comes for NICM, CHF, alternating bundle branch blocks  . CARDIOVERSION N/A 09/13/2013   Procedure: CARDIOVERSION;  Surgeon: Carlena Bjornstad, MD;  Location: [facility] ENDOSCOPY;  Service: Cardiovascular;  Laterality: N/A;  . CARDIOVERSION N/A 10/17/2013   Procedure: CARDIOVERSION;  Surgeon: Lelon Perla, MD;  Location: [place];  Service: Cardiovascular;  Laterality:  N/A;  . CARDIOVERSION N/A 07/04/2016   Procedure: CARDIOVERSION;  Surgeon: Sanda Klein, MD;  Location: [facility] ENDOSCOPY;  Service: Cardiovascular;  Laterality: N/A;  . CATARACT EXTRACTION W/ INTRAOCULAR LENS  IMPLANT, BILATERAL Bilateral   . CORONARY ANGIOPLASTY    . INSERT / REPLACE / REMOVE PACEMAKER    . KNEE ARTHROSCOPY Right   . LUMBAR DISC SURGERY    . PERMANENT PACEMAKER INSERTION N/A 07/15/2013   Procedure: PERMANENT PACEMAKER INSERTION;  Surgeon: Deboraha Sprang, MD;  Location: [facility] CATH LAB;  Service: Cardiovascular;  Laterality: N/A;  . SHOULDER ARTHROSCOPY Right   . THROAT SURGERY  ~ 1950   "exploratory; felt like I had something in my throat & they went in to look around"  . TONSILLECTOMY       Home Medications:  Prior to Admission medications   Medication Sig Start Date End Date Taking? Authorizing Provider  acetaminophen (TYLENOL) 325 MG tablet Take 650 mg by mouth every 6 (six) hours as needed for mild pain, fever or headache.   Yes [provider]  apixaban (ELIQUIS) 2.5 MG TABS tablet Take 2.5 mg by mouth 2 (two) times daily.   Yes [provider]  carvedilol (COREG) 6.25 MG tablet TAKE 1 TABLET (6.25 MG TOTAL) BY MOUTH 2 (TWO) TIMES DAILY WITH A MEAL. 07/31/17  Yes Lyda Jester M, PA-C  etodolac (LODINE) 400 MG tablet Take 400 mg by mouth as needed (for gout).  06/01/17  Yes [provider]  fenofibrate (TRICOR) 48 MG tablet TAKE 1/2 TABLET BY MOUTH EVERY DAY 10/25/17  Yes Jerline Pain, MD  fish oil-omega-3 fatty acids 1000 MG capsule Take 2 g by mouth daily.    Yes [provider]  gabapentin (NEURONTIN) 300 MG capsule Take 300 mg by mouth 2 (two) times daily. 09/09/15  Yes [provider]  guaiFENesin (MUCINEX) 600 MG 12 hr tablet Take 600 mg by mouth 2 (two) times daily as needed for cough or to loosen phlegm (COLDS).    Yes [provider]  KLOR-CON M20 20 MEQ tablet TAKE 1 TABLET BY MOUTH EVERY DAY 08/15/17  Yes  Jerline Pain, MD  levothyroxine (SYNTHROID, LEVOTHROID) 75 MCG tablet Take 1 tablet (75 mcg total) by mouth daily. 04/21/14  Yes Carlena Bjornstad, MD  Multiple Vitamins-Minerals (OCUVITE PO) Take 2 tablets by mouth daily.    Yes [provider]  omeprazole (PRILOSEC) 20 MG capsule Take 20 mg by mouth daily.    Yes [provider]  predniSONE (DELTASONE) 10 MG tablet Take 10 mg by mouth daily.  09/04/15  Yes [provider]  tamsulosin (FLOMAX) 0.4 MG CAPS capsule Take 0.4 mg by mouth 2 (two) times daily.    Yes [provider]  torsemide (DEMADEX) 20 MG tablet Take 4  tablets (80 mg total) by mouth daily. 07/05/17  Yes Deboraha Sprang, MD  triazolam (HALCION) 0.25 MG tablet Take 0.5 tablets (0.125 mg total) by mouth at bedtime as needed for sleep. 10/24/13  Yes Delfina Redwood, MD  ULORIC 40 MG tablet Take 40 mg by mouth daily. 04/27/17  Yes [provider]  vitamin B-12 (CYANOCOBALAMIN) 100 MCG tablet Take 100 mcg by mouth daily.   Yes [provider]  vitamin E 100 UNIT capsule Take 100 Units by mouth daily.   Yes [provider]    Inpatient Medications: Scheduled Meds: . apixaban  2.5 mg Oral BID  . febuxostat  40 mg Oral Daily  . fenofibrate  54 mg Oral Daily  . gabapentin  300 mg Oral BID  . hydrocortisone sodium succinate  50 mg Intravenous Q8H  . levothyroxine  75 mcg Oral QAC breakfast  . omega-3 acid ethyl esters  2 g Oral Daily  . pantoprazole  40 mg Oral Daily  . tamsulosin  0.4 mg Oral BID  . vitamin B-12  100 mcg Oral Daily  . vitamin E  100 Units Oral Daily   Continuous Infusions: . cefTRIAXone (ROCEPHIN)  IV 1 g (11/09/17 0108)   PRN Meds: acetaminophen **OR** acetaminophen, ondansetron **OR** ondansetron (ZOFRAN) IV, senna-docusate, triazolam  Allergies:   No Known Allergies  Social History:   Social History   Socioeconomic History  . Marital status: Married    Spouse name: Not on file  . Number of  children: Not on file  . Years of education: Not on file  . Highest education level: Not on file  Occupational History  . Not on file  Social Needs  . Financial resource strain: Not on file  . Food insecurity:    Worry: Not on file    Inability: Not on file  . Transportation needs:    Medical: Not on file    Non-medical: Not on file  Tobacco Use  . Smoking status: Never Smoker  . Smokeless tobacco: Never Used  Substance and Sexual Activity  . Alcohol use: No    Alcohol/week: 0.0 oz  . Drug use: No  . Sexual activity: Not Currently  Lifestyle  . Physical activity:    Days per week: Not on file    Minutes per session: Not on file  . Stress: Not on file  Relationships  . Social connections:    Talks on phone: Not on file    Gets together: Not on file    Attends religious service: Not on file    Active member of club or organization: Not on file    Attends meetings of clubs or organizations: Not on file    Relationship status: Not on file  . Intimate partner violence:    Fear of current or ex partner: Not on file    Emotionally abused: Not on file    Physically abused: Not on file    Forced sexual activity: Not on file  Other Topics Concern  . Not on file  Social History Narrative   He has never smoked or drank. No illcit drug use . He is retired from the [employer] and lives with his wife. Illicit Drug Use- no    Family History:    Family History  Problem Relation Age of Onset  . Heart attack Sister   . Lung cancer Brother        smoker  . Colon cancer Neg Hx  ROS:  Please see the history of present illness.   All other ROS reviewed and negative.     Physical Exam/Data:   Vitals:   11/09/17 0115 11/09/17 0255 11/09/17 0500 11/09/17 0554  BP: (!) 78/46 (!) 84/57  (!) 83/49  Pulse:  74  69  Resp:    18  Temp:    (!) 97.4 F (36.3 C)  TempSrc:    Oral  SpO2:    94%  Weight:   178 lb (80.7 kg)   Height:        Intake/Output Summary (Last 24  hours) at 11/09/2017 1019 Last data filed at 11/09/2017 0629 Gross per 24 hour  Intake 2401.55 ml  Output 1250 ml  Net 1151.55 ml   Filed Weights   11/08/17 1643 11/08/17 2314 11/09/17 0500  Weight: 180 lb (81.6 kg) 178 lb (80.7 kg) 178 lb (80.7 kg)   Body mass index is 25.54 kg/m.  General:  Elderly white male, in no acute distress HEENT: normal Lymph: no adenopathy Neck: no JVD Endocrine:  No thryomegaly Vascular: No carotid bruits; FA pulses 2+ bilaterally without bruits  Cardiac:  normal S1, S2; RRR; no murmur  Lungs:  Faint crackles at the bases Abd: soft, nontender, no hepatomegaly  Ext: no edema Musculoskeletal:  1+ bilateral pedal, ankle LE edema  Skin: warm and dry  Neuro:  CNs 2-12 intact, no focal abnormalities noted Psych:  Normal affect   EKG:  The EKG was personally reviewed and demonstrates:  V paced 75 bpm  Telemetry:  Telemetry was personally reviewed and demonstrates:  V paced, controlled rates in the 70s  Relevant CV Studies: 2D Echo 02/2016 Study Conclusions  - Left ventricle: The cavity size was mildly dilated. Systolic   function was severely reduced. The estimated ejection fraction   was in the range of 20% to 25%. Doppler parameters are consistent   with restrictive physiology, indicative of decreased left   ventricular diastolic compliance and/or increased left atrial   pressure. Doppler parameters are consistent with elevated   ventricular end-diastolic filling pressure. - Ventricular septum: Septal motion showed paradox. - Aortic valve: Trileaflet; mildly thickened, mildly calcified   leaflets. There was mild regurgitation. - Mitral valve: Structurally normal valve. There was moderate   regurgitation. - Left atrium: The atrium was mildly dilated. - Right ventricle: Systolic function was normal. - Right atrium: The atrium was normal in size. - Tricuspid valve: There was moderate regurgitation. - Pulmonic valve: Structurally normal valve.  There was no   regurgitation. - Pulmonary arteries: Systolic pressure was mildly increased. PA   peak pressure: 38 mm Hg (S). - Pericardium, extracardiac: There was no pericardial effusion.  Impressions:  - Severely reduced LVEF with diffuse hypokinesis, paradoxical   septal motion and akinetic apical segments. There is no thrombus.   Restrictive pattern of diastolic dysfunction.  Laboratory Data:  Chemistry Recent Labs  Lab 11/08/17 1326 11/09/17 0002  NA 137 136  K 5.2* 4.5  CL 104 104  CO2 22 21*  GLUCOSE 88 145*  BUN 63* 59*  CREATININE 3.27* 3.26*  CALCIUM 8.5* 7.9*  GFRNONAA 14* 14*  GFRAA 17* 17*  ANIONGAP 11 11    No results for input(s): PROT, ALBUMIN, AST, ALT, ALKPHOS, BILITOT in the last 168 hours. Hematology Recent Labs  Lab 11/08/17 1326 11/09/17 0002  WBC 14.4* 12.6*  RBC 3.69* 3.74*  HGB 11.7* 12.0*  HCT 37.3* 37.7*  MCV 101.1* 100.8*  MCH 31.7 32.1  MCHC  31.4 31.8  RDW 14.3 14.6  PLT 213 212   Cardiac EnzymesNo results for input(s): TROPONINI in the last 168 hours.  Recent Labs  Lab 11/08/17 1333  TROPIPOC 0.03    BNPNo results for input(s): BNP, PROBNP in the last 168 hours.  DDimer No results for input(s): DDIMER in the last 168 hours.  Radiology/Studies:  Dg Chest 2 View  Result Date: 11/08/2017 CLINICAL DATA:  Shortness of breath.  Hypotension. EXAM: CHEST - 2 VIEW COMPARISON:  03/16/2016 and 12/03/2015 FINDINGS: Chronic cardiomegaly. Triple lead pacemaker in place. Slight pulmonary vascular redistribution to the upper lobes without pulmonary edema. The lungs are hyperinflated with flattening of the diaphragm. No definitive effusions. No acute bone abnormality. IMPRESSION: 1. Chronic cardiomegaly. 2.  Emphysema (ICD10-J43.9). 3. Slight pulmonary vascular congestion. Electronically Signed   By: Lorriane Shire M.D.   On: 11/08/2017 14:03    Assessment and Plan:   KAIGE WHISTLER is a 84 y.o. male with a hx of dilated cardiomyopathy,  chronic systolic HF with EF of 82-99%, s/p Bi-V pacemaker followed by Dr. Caryl Comes, persistent atrial fibrillation w/ failed cardioversion 06/2016, chronic anticoagulation therapy with Eliquis, CKD and h/o CAD, who is being seen today for the evaluation of persistent hypotension, at the request of Dr. Bonner Puna, Internal Medicine.  Pt presented to the ED on 11/08/17 with CC of weakness and dizziness. He was noted to be hypotensive in the ED with SBPs in the 80s. Also with AKI, w/ SCr at 3.27 (baseline SCr in the 1.8-2.3 range over the last year). UA negative. AKI initially felt to be pre-renal based on history of decreased PO intake and long hrs in the hot heat. Also taking PO diuretics at home for HF. Initially felt to be 2/2 intravascular vol depletion.   However, he has not had much improvement in BP nor SCr with hydration. SBPs remain in the 80s. SCr still 3.2. ? Low output HF / cardiorenal syndrome. This is likely end stage heart failure. His CXR yesterday showed slight pulmonary vascular congestion and he has 1+ bilateral pedal/ankle edema on exam. His Coreg and torsemide remain on hold. Given his advanced age, he is not a candidate for advanced HF therapries.  May need to consider palliative care. For now continue to hold coreg and torsemide.  Given his advanced age, increasing weakness, hypotension and recent fall at home a few weeks ago, we will stop Eliquis, as risk may now outweigh the benefit. I've discussed this with Dr. Marlou Porch and he agrees. He will see the pt later today.   IM will continue to treat right UE cellulitis. ? Blood cultures.    For questions or updates, please contact [place] Please consult [email or web address] for contact info under Cardiology/STEMI.   Signed, Lyda Jester, PA-C  11/09/2017 10:19 AM   Personally seen and examined. Agree with above.  84 year old male with ejection fraction 20%, biventricular pacemaker here with acute renal failure, hypotension, chronic low  level prednisone use, fairly low random cortisol, wife died 3 weeks ago.  Currently without any significant shortness of breath, no chest pain.  His right hand does bother him, worried about cellulitis he states.  GEN: Elderly, laying flat in bed in no acute distress  HEENT: normal  Neck: no significant JVD, carotid bruits, or masses Cardiac: RRR occasional ectopy; no murmurs, rubs, or gallops,no edema  Respiratory:  clear to auscultation bilaterally, normal work of breathing, faint crackles bases GI: soft, nontender, nondistended, + BS MS: no deformity or atrophy,  thin skin, ecchymosis on both arms, mildly edematous right hand Skin: warm and dry, no rash Neuro:  Alert and Oriented x 3, Strength and sensation are intact Psych: euthymic mood, full affect  Echo: EF 20% Early July OptiVol-normal Labs: Creatinine 3.26, plasma cortisol 6.2, white count 12.6  Assessment and plan:  Severe hypotension in the setting of dilated cardiomyopathy, chronic systolic heart failure  -Agree with holding medications at this time.  -Agree with recent fluid administration to help bolster intravascular volume, being careful to watch oxygen saturations to make sure that we are not developing heart failure.  -I also like the idea of potential adrenal insufficiency given his chronic low-grade prednisone use which may be used for joint pains he states.  -However, given his severe cardiomyopathy, it is certainly plausible that his cardiac function is driving this entire scenario.  Cardiorenal syndrome.  Also with his wife recent death, there can be a degree of broken heart syndrome as well.  -Continue with supportive care currently.  Nonaggressive medical interventions.  We should consider having the palliative care team see him and his family to clearly establish goals of care.  He states he wants to get better.  Atrial fibrillation, persistent - Stopping Eliquis given his acute renal failure. - Once stable, we  will have to consider Coumadin therapy.   Candee Furbish, MD

## 2017-11-09 NOTE — Progress Notes (Signed)
Patient manual BP 78/46 after the 250 mL bolus of normal saline was given.  Notified NP Tylene Fantasia. Will continue to monitor the patient

## 2017-11-09 NOTE — Progress Notes (Signed)
Patient BP 83/49. Notified NP Tylene Fantasia.  Will continue to monitor the patient.

## 2017-11-09 NOTE — Progress Notes (Deleted)
In and out cath patient.  Retrieved 700 ml of urine.  Will continue to monitor the patietn.

## 2017-11-10 ENCOUNTER — Inpatient Hospital Stay (HOSPITAL_COMMUNITY): Payer: Medicare Other

## 2017-11-10 DIAGNOSIS — I361 Nonrheumatic tricuspid (valve) insufficiency: Secondary | ICD-10-CM

## 2017-11-10 DIAGNOSIS — I351 Nonrheumatic aortic (valve) insufficiency: Secondary | ICD-10-CM

## 2017-11-10 DIAGNOSIS — I340 Nonrheumatic mitral (valve) insufficiency: Secondary | ICD-10-CM

## 2017-11-10 LAB — BLOOD GAS, ARTERIAL
ACID-BASE DEFICIT: 5.3 mmol/L — AB (ref 0.0–2.0)
BICARBONATE: 17.7 mmol/L — AB (ref 20.0–28.0)
DELIVERY SYSTEMS: POSITIVE
Drawn by: 398991
EXPIRATORY PAP: 5
FIO2: 40
Inspiratory PAP: 15
O2 SAT: 97.7 %
PATIENT TEMPERATURE: 98.6
PCO2 ART: 24.6 mmHg — AB (ref 32.0–48.0)
PH ART: 7.471 — AB (ref 7.350–7.450)
RATE: 8 resp/min
pO2, Arterial: 98.1 mmHg (ref 83.0–108.0)

## 2017-11-10 LAB — BASIC METABOLIC PANEL
ANION GAP: 11 (ref 5–15)
BUN: 53 mg/dL — ABNORMAL HIGH (ref 8–23)
CALCIUM: 8.2 mg/dL — AB (ref 8.9–10.3)
CO2: 21 mmol/L — AB (ref 22–32)
CREATININE: 2.9 mg/dL — AB (ref 0.61–1.24)
Chloride: 107 mmol/L (ref 98–111)
GFR calc Af Amer: 19 mL/min — ABNORMAL LOW (ref >60)
GFR calc non Af Amer: 17 mL/min — ABNORMAL LOW (ref >60)
GLUCOSE: 167 mg/dL — AB (ref 70–99)
Potassium: 4.7 mmol/L (ref 3.5–5.1)
Sodium: 139 mmol/L (ref 135–145)

## 2017-11-10 LAB — ECHOCARDIOGRAM COMPLETE
HEIGHTINCHES: 70 in
Weight: 2848 oz

## 2017-11-10 LAB — CBC
HEMATOCRIT: 32.6 % — AB (ref 39.0–52.0)
Hemoglobin: 10.5 g/dL — ABNORMAL LOW (ref 13.0–17.0)
MCH: 32.2 pg (ref 26.0–34.0)
MCHC: 32.2 g/dL (ref 30.0–36.0)
MCV: 100 fL (ref 78.0–100.0)
Platelets: 224 10*3/uL (ref 150–400)
RBC: 3.26 MIL/uL — ABNORMAL LOW (ref 4.22–5.81)
RDW: 14.7 % (ref 11.5–15.5)
WBC: 14.5 10*3/uL — ABNORMAL HIGH (ref 4.0–10.5)

## 2017-11-10 LAB — TSH: TSH: 0.169 u[IU]/mL — ABNORMAL LOW (ref 0.350–4.500)

## 2017-11-10 LAB — MRSA PCR SCREENING: MRSA BY PCR: NEGATIVE

## 2017-11-10 LAB — T4, FREE: Free T4: 0.98 ng/dL (ref 0.82–1.77)

## 2017-11-10 MED ORDER — IPRATROPIUM-ALBUTEROL 0.5-2.5 (3) MG/3ML IN SOLN
3.0000 mL | Freq: Once | RESPIRATORY_TRACT | Status: AC
  Administered 2017-11-10: 3 mL via RESPIRATORY_TRACT
  Filled 2017-11-10: qty 3

## 2017-11-10 MED ORDER — ALBUTEROL SULFATE (2.5 MG/3ML) 0.083% IN NEBU
2.5000 mg | INHALATION_SOLUTION | RESPIRATORY_TRACT | Status: DC | PRN
  Administered 2017-11-12 (×2): 2.5 mg via RESPIRATORY_TRACT
  Filled 2017-11-10 (×2): qty 3

## 2017-11-10 MED ORDER — LEVALBUTEROL HCL 1.25 MG/0.5ML IN NEBU
INHALATION_SOLUTION | RESPIRATORY_TRACT | Status: AC
  Filled 2017-11-10: qty 0.5

## 2017-11-10 MED ORDER — FUROSEMIDE 10 MG/ML IJ SOLN
INTRAMUSCULAR | Status: AC
  Administered 2017-11-10: 80 mg via INTRAVENOUS
  Filled 2017-11-10: qty 8

## 2017-11-10 MED ORDER — LEVALBUTEROL HCL 1.25 MG/0.5ML IN NEBU
1.2500 mg | INHALATION_SOLUTION | Freq: Four times a day (QID) | RESPIRATORY_TRACT | Status: DC
  Administered 2017-11-10 – 2017-11-11 (×6): 1.25 mg via RESPIRATORY_TRACT
  Filled 2017-11-10 (×5): qty 0.5

## 2017-11-10 MED ORDER — OLANZAPINE 2.5 MG PO TABS
1.2500 mg | ORAL_TABLET | Freq: Every evening | ORAL | Status: DC | PRN
  Filled 2017-11-10: qty 0.5

## 2017-11-10 MED ORDER — HYDROCORTISONE NA SUCCINATE PF 100 MG IJ SOLR
25.0000 mg | Freq: Every day | INTRAMUSCULAR | Status: DC
  Administered 2017-11-11 – 2017-11-14 (×4): 25 mg via INTRAVENOUS
  Filled 2017-11-10 (×4): qty 2

## 2017-11-10 MED ORDER — FUROSEMIDE 10 MG/ML IJ SOLN
80.0000 mg | Freq: Two times a day (BID) | INTRAMUSCULAR | Status: DC
  Administered 2017-11-11 – 2017-11-15 (×10): 80 mg via INTRAVENOUS
  Filled 2017-11-10 (×11): qty 8

## 2017-11-10 MED ORDER — FUROSEMIDE 10 MG/ML IJ SOLN
80.0000 mg | Freq: Once | INTRAMUSCULAR | Status: AC
  Administered 2017-11-10: 80 mg via INTRAVENOUS

## 2017-11-10 MED ORDER — HYDROCORTISONE NA SUCCINATE PF 100 MG IJ SOLR: 50.0000 mg | Freq: Two times a day (BID) | INTRAMUSCULAR | Status: DC

## 2017-11-10 MED ORDER — PIPERACILLIN-TAZOBACTAM IN DEX 2-0.25 GM/50ML IV SOLN
2.2500 g | Freq: Three times a day (TID) | INTRAVENOUS | Status: DC
  Administered 2017-11-10 – 2017-11-11 (×3): 2.25 g via INTRAVENOUS
  Filled 2017-11-10 (×4): qty 50

## 2017-11-10 NOTE — Progress Notes (Addendum)
PROGRESS NOTE  Subjective: Called by RN due to respiratory distress, wheezing that started abruptly during lunch. Ordered breathing treatment which was underway when I arrived to the room. The patient and daughter describe new rattling sounds in the throat with breathing, difficulty catching his breath like he's choking. Can't get rattling fluid up with coughing. Breathing treatment offered very little benefit. Deny choking event or recent trouble swallowing.   Objective: Elderly male now tachypneic, tachycardic with elevated WOB, diffusely coarse breath sounds throughout anteriorly and posteriorly. A little worse leg edema. Remains alert and oriented, more anxious.   Assessment & Plan: Initial suspicion for aspiration event vs. flash pulmonary edema vs. COPD exacerbation felt to be less likely. Was given significant fluids for hypotension, holding diuretic, and giving stress steroids; checked CXR which seems to confirm pulmonary edema, effusions with bibasilar opacities.  - Lasix 80mg  IV x1 (home torsemide 80mg  po daily) - Insert foley catheter to quantify UOP - Started BiPAP, transfer level of care to SDU. - Discussed with Dr. Marlou Porch who agreed with plan. - Appreciate assistance of 5W nursing staff and rapid response RN.   Reevaluation ~5:30pm: WOB improved, still tachycardic and tachypneic.  - I again had a detailed discussion regarding code status and at this point the patient and family agree that his wishes are most consistent with nonintubation, nonescalation of care to pressors, etc. He is now DNR.  - Will get ABG for prognostic information.  - Continue xopenex Tx prn if this seems to provide symptomatic benefit.   Update ~7:00pm: ABG results reviewed, respiratory alkalosis on BiPAP 15/5, 40% FiO2, oxygenating ok. Patient feels better, respiratory rate down, still coarse but improved.  - Will continue lasix 80mg  IV q12h (next dose 3am) - In anticipation of acute delirium, considered  olanzapine though he's got R on T on telemetry, so will discontinue this, and sign out to night team to exhaust all other possibilities prior to giving medications for delirium. - Monitor BMP. I've shared with patient and family that one possible outcome of taking fluid off is renal failure for which he is not a dialysis candidate. Will cross that bridge if/when we get to it.  Levi Pour, MD 11/10/2017, 6:31 PM

## 2017-11-10 NOTE — Progress Notes (Signed)
While lab was drawing blood, the patient sustained two skin tears on his left arm.  Foams have been applied.  Will continue to monitor.

## 2017-11-10 NOTE — Progress Notes (Signed)
PROGRESS NOTE  Levi Lewis  ZOX:[id number] DOB: 11/14/1917 DOA: 11/08/2017 PCP: [name], MD  Outpatient Specialists: Cardiology, [facility] Brief Narrative: Levi Lewis is a 84 y.o. male with a history of chronic HFrEF, gout/arthritis on chronic prednisone, persistent AFib s/p BiV PPM and stage III CKD who presented to the ED about 3 weeks following the death of his wife of 52 years with right arm pain, swelling and low blood pressure associated with generalized weakness. WBC 14k, Cr up to 3.2 from baseline of 2, UA and CXR without acute infection. He was admitted for dehydration causing hypotension with some improvement after IV fluids and given ceftriaxone for right arm cellulitis. U/S ruled out DVT/SVT. He also required I/O cath for urinary retention which is resolved. AKI was also noted, improved with IVF's. Renal U/S showed cortical atrophy and no hydronephrosis. Cardiology was consulted and confirmed that the patient is not a candidate for advanced HF therapies, echocardiogram showing severely hypokinetic LV with EF 20-25% as well as biatral enlargement and valvular disease.  Assessment & Plan: Principal Problem:   Hypotension Active Problems:   CAD (coronary artery disease)   Atrial fibrillation (HCC)   Pacemaker   Chronic systolic CHF (congestive heart failure) (HCC)   Acute on chronic renal failure (HCC)   Arm swelling right  Hypotension: Possibly related to cellulitis with relative adrenal insufficiency vs. progressive heart failure/broken heart syndrome. No evidence of bleeding. Note low-normal BPs as outpatient. Leukocytosis not likely to be reliable in setting of steroids. TSH is suppressed but T4 is normal.  - Cardiology consulted in light of cardiac history, appreciate their recommendations.  - Having some increase leg swelling, vascular congestion on CXR, so will decrease IVF's, continue to home anti-HTN and diuretic. - Random cortisol was borderline, hyperkalemia (due to  relative mineralocorticoid deficiency vs. AKI), so empirically started stress-dose steroids. Will taper to q12h today, then daily tomorrow and will need prolonged taper thereafter.   Right arm cellulitis: No evidence of clot on U/S. Exam is not consistent with gout as primary process, though will be starting stress steroids regardless.  - Continue ceftriaxone (no purulent component). If continues improving, may transition to po in next 24-48 hrs. - Elevate right arm - No blood cultures drawn at admission, would be low yield now.  Chronic systolic CHF: EF 81-19% stable this admission. Unable to determine diastolic function, though severe biatrial enlargement, mod-severe MR, mild AR, moderate TR with moderately increased PASP. No focal LV ballooning, etc. to definitively determine takotsubo.  - Cardiology confirms pt not candidate for advanced therapies. Holding beta blocker with hypotension, no ACE/ARB due to renal failure. Volume down, so holding diuretic.   AKI on stage IV CKD: Suspected to be due to intravascular volume depletion. Improved with IVF's. Possibly hemodynamically mediated with ongoing hypotension, as well as due to NSAID nephropathy (taking etodolac multiple times/day for right arm pain), possibly urinary retention as well (resolved) and ?cardiorenal. Renal U/S with cortical thinning suggestive of known CKD without hydronephrosis.  Discussed that not a HD candidate were renal function to decline. - Stopped eliquis. May consider coumadin pending renal function vs. hold anticoagulation.  - Monitor Cr and UOP  Acute delirium: Hallucinations, disorientation waxing/waning worst last night, back to baseline this morning.  - Discussed delirium precautions with pt and family. Family to stay at bedside as much as possible.  - Considered very low dose zyprexa 1.25mg  po qHS prn though he has prolonged QTc and this would be high risk medication  for him.  - Tapering steroids as quickly as  reasonable. Though  concern for steroid-induced psychosis lower than that for delirium with abrupt return to normal this AM.  Acute urinary retention due to BPH: Prostate enlargement (4.3cm) on U/S is stable.  - Holding flomax with hypotension. Will have to monitor closely UOP with condom catheter, bladder scan prn.  AFib:  - Monitor HR while holding beta blocker, getting BiV pacing - Stop eliquis with significant risk of bleeding and current renal failure  Grief reaction: Appropriate bereavement with wife of 26 years passing earlier this month, currently lives alone.  - Pt's pastor has been by to see him.  - Will get visit from [place] following his wife's passing next week.  Chronic hypoxic respiratory failure, COPD: No exacerbation  - Continue oxygen  Gout: Chronic.  - Continue febuxostat (continued from outpatient setting), can discuss with patient the recent findings of CV mortality with this drug.  - Stop prednisone, will need to taper  Hypothyroidism:  - continue synthroid  GERD:  - PPI  DVT prophylaxis: SCDs Code Status: Full Family Communication: Daughter at bedside. Has POA paperwork signed by pt in 2005 stating his children to be POA.  Disposition Plan: Uncertain. PT to be consulted for most appropriate venue at disposition. Palliative care also consulted to continue goals of care discussions, suspect he'd benefit from palliative/hospice following in either home or facility. I've discussed code status at length and pt likely to become DNR, but wants to think more about it today. Introduced palliative care mindset which he and his children are familiar with, and made sure they'd be amenable to discussions prior to consult.  Consultants:   Cardiology  Palliative care team  Procedures:   None  Antimicrobials:  Zosyn 7/24  Ceftriaxone 7/24 >>    Subjective: Was disoriented last night trying to get out of bed needing frequent reorientation by son at bedside. Was  experiencing visual hallucinations. All resolved this morning once sun came up. No chest pain or dyspnea. Right arm swelling improved and less pain.  Objective: Vitals:   11/09/17 0554 11/09/17 1429 11/09/17 2327 11/10/17 0550  BP: (!) 83/49 (!) 89/50 (!) 90/54 105/71  Pulse: 69 73 75 76  Resp: 18 18  20   Temp: (!) 97.4 F (36.3 C) 97.7 F (36.5 C) 97.7 F (36.5 C) 97.6 F (36.4 C)  TempSrc: Oral Oral  Oral  SpO2: 94% 92% 93% 96%  Weight:      Height:        Intake/Output Summary (Last 24 hours) at 11/10/2017 1416 Last data filed at 11/10/2017 1237 Gross per 24 hour  Intake 2569.12 ml  Output 1450 ml  Net 1119.12 ml   Filed Weights   11/08/17 1643 11/08/17 2314 11/09/17 0500  Weight: 81.6 kg (180 lb) 80.7 kg (178 lb) 80.7 kg (178 lb)   Gen: Elderly, frail male in no distress Pulm: Nonlabored breathing supplemental oxygen. No wheezing or crackles. CV: Regular. Distant, no murmur, rub, or gallop. No JVD. 1+ pitting LE edema GI: Abdomen soft, non-tender, non-distended, with normoactive bowel sounds.  Ext: As below, otherwise warm, no deformities Skin: Right arm with diffuse edema from hand/fingers to elbow with tender erythema. Overall improved from yesterday with better ROM, though still can't completely appose fingers. 2 superficial skin tears on left arm with foam pads. Very very thin skin diffusely.  Neuro: Alert and oriented. No focal neurological deficits. Psych: Judgement and insight appear fair. Mood euthymic & affect congruent.  Behavior is appropriate.    Data Reviewed: I have personally reviewed following labs and imaging studies  CBC: Recent Labs  Lab 11/08/17 1326 11/09/17 0002 11/10/17 0451  WBC 14.4* 12.6* 14.5*  HGB 11.7* 12.0* 10.5*  HCT 37.3* 37.7* 32.6*  MCV 101.1* 100.8* 100.0  PLT 213 212 650   Basic Metabolic Panel: Recent Labs  Lab 11/08/17 1326 11/09/17 0002 11/10/17 0451  NA 137 136 139  K 5.2* 4.5 4.7  CL 104 104 107  CO2 22 21* 21*    GLUCOSE 88 145* 167*  BUN 63* 59* 53*  CREATININE 3.27* 3.26* 2.90*  CALCIUM 8.5* 7.9* 8.2*   GFR: Estimated Creatinine Clearance: 14.7 mL/min (A) (by C-G formula based on SCr of 2.9 mg/dL (H)). Liver Function Tests: No results for input(s): AST, ALT, ALKPHOS, BILITOT, PROT, ALBUMIN in the last 168 hours. No results for input(s): LIPASE, AMYLASE in the last 168 hours. No results for input(s): AMMONIA in the last 168 hours. Coagulation Profile: No results for input(s): INR, PROTIME in the last 168 hours. Cardiac Enzymes: No results for input(s): CKTOTAL, CKMB, CKMBINDEX, TROPONINI in the last 168 hours. BNP (last 3 results) No results for input(s): PROBNP in the last 8760 hours. HbA1C: No results for input(s): HGBA1C in the last 72 hours. CBG: No results for input(s): GLUCAP in the last 168 hours. Lipid Profile: No results for input(s): CHOL, HDL, LDLCALC, TRIG, CHOLHDL, LDLDIRECT in the last 72 hours. Thyroid Function Tests: Recent Labs    11/10/17 0442 11/10/17 0751  TSH 0.169*  --   FREET4  --  0.98   Anemia Panel: No results for input(s): VITAMINB12, FOLATE, FERRITIN, TIBC, IRON, RETICCTPCT in the last 72 hours. Urine analysis:    Component Value Date/Time   COLORURINE YELLOW 11/08/2017 [place] 11/08/2017 1646   LABSPEC 1.006 11/08/2017 1646   PHURINE 5.0 11/08/2017 1646   GLUCOSEU NEGATIVE 11/08/2017 1646   HGBUR NEGATIVE 11/08/2017 1646   BILIRUBINUR NEGATIVE 11/08/2017 1646   KETONESUR NEGATIVE 11/08/2017 1646   PROTEINUR NEGATIVE 11/08/2017 1646   UROBILINOGEN 1.0 02/19/2015 1815   NITRITE NEGATIVE 11/08/2017 1646   LEUKOCYTESUR NEGATIVE 11/08/2017 1646   No results found for this or any previous visit (from the past 240 hour(s)).    Radiology Studies: US Renal  Result Date: 11/09/2017 CLINICAL DATA:  84 year old male with acute renal injury. EXAM: RENAL / URINARY TRACT ULTRASOUND COMPLETE COMPARISON:  CT Abdomen and Pelvis 02/19/2015.  FINDINGS: Right Kidney: Length: 10.3 centimeters. Echogenic, atrophied right kidney (image 3). No right hydronephrosis or renal mass. Left Kidney: Length: 11.5 centimeters. Echogenic, atrophied left kidney (image 18) with no left hydronephrosis or renal mass. Bladder: Appears normal for degree of bladder distention. Prostate hypertrophy measuring 4.3 centimeters diameter appears stable since 2016. IMPRESSION: No acute findings. Chronic bilateral medical renal disease with renal cortical atrophy. Electronically Signed   By: Genevie Ann M.D.   On: 11/09/2017 16:08    Scheduled Meds: . febuxostat  40 mg Oral Daily  . fenofibrate  54 mg Oral Daily  . gabapentin  300 mg Oral BID  . hydrocortisone sodium succinate  50 mg Intravenous Q8H  . levothyroxine  75 mcg Oral QAC breakfast  . mouth rinse  15 mL Mouth Rinse BID  . omega-3 acid ethyl esters  2 g Oral Daily  . pantoprazole  40 mg Oral Daily  . vitamin B-12  100 mcg Oral Daily  . vitamin E  100 Units Oral Daily  Continuous Infusions: . sodium chloride Stopped (11/09/17 2316)  . cefTRIAXone (ROCEPHIN)  IV Stopped (11/09/17 2240)     LOS: 2 days   Time spent: 35 minutes.  Patrecia Pour, MD [facility] [email or web address] Password [email or web address] 11/10/2017, 2:16 PM

## 2017-11-10 NOTE — Progress Notes (Signed)
RT called to patient room by rapid response RN due to patient in severe respiratory distress.  Placed patient on bipap.  Auscultated patient and noted that patient had coarse breath sounds throughout.  Sats, when reading with good waveform, was reading 92% on non-rebreather mask.  Patient currently tolerating bipap well.  Also receiving nebulizer xopenex treatment.  Will continue to monitor.

## 2017-11-10 NOTE — Progress Notes (Signed)
  Echocardiogram 2D Echocardiogram has been performed.  Jennette Dubin 11/10/2017, 10:46 AM

## 2017-11-10 NOTE — Significant Event (Signed)
Rapid Response Event Note  Overview: Time Called: 1450 Arrival Time: 4332 Event Type: Respiratory  Initial Focused Assessment: Patient in acute respiratory distress using accessory muscles.  Rhonchi through out.  BP 149/92  HR 108  RR 30  O2 sat 90 on 4L [place]  Interventions: Resp tx given Placed on 100% NRB 80mg  Lasix given IV PCXR done Placed on Bipap   1700  Patient improving, still labored Incontinent of urine  1800 Patient breathing easier on Bipap, occasionally restless Patient now with DNR/DNI status  Plan of Care (if not transferred):  Level of care increased to SD level. Family at bedside through out event  Event Summary: Name of Physician Notified: Grunz at 1450    at    Outcome: Transferred (Comment)(changed to [place] level, stayed in room)  Event End Time: 1900  Raliegh Ip

## 2017-11-10 NOTE — Progress Notes (Addendum)
Progress Note  Patient Name: Levi Lewis Date of Encounter: 11/10/2017  Primary Cardiologist: Candee Furbish, MD   Subjective   No chest pain, no acute SOB, still weak with movement and he had hallucinations last night. ? Steroids.    Inpatient Medications    Scheduled Meds: . febuxostat  40 mg Oral Daily  . fenofibrate  54 mg Oral Daily  . gabapentin  300 mg Oral BID  . hydrocortisone sodium succinate  50 mg Intravenous Q8H  . levothyroxine  75 mcg Oral QAC breakfast  . mouth rinse  15 mL Mouth Rinse BID  . omega-3 acid ethyl esters  2 g Oral Daily  . pantoprazole  40 mg Oral Daily  . vitamin B-12  100 mcg Oral Daily  . vitamin E  100 Units Oral Daily   Continuous Infusions: . sodium chloride Stopped (11/09/17 2316)  . cefTRIAXone (ROCEPHIN)  IV Stopped (11/09/17 2240)   PRN Meds: acetaminophen **OR** acetaminophen, ondansetron **OR** ondansetron (ZOFRAN) IV, senna-docusate, triazolam   Vital Signs    Vitals:   11/09/17 0554 11/09/17 1429 11/09/17 2327 11/10/17 0550  BP: (!) 83/49 (!) 89/50 (!) 90/54 105/71  Pulse: 69 73 75 76  Resp: 18 18  20   Temp: (!) 97.4 F (36.3 C) 97.7 F (36.5 C) 97.7 F (36.5 C) 97.6 F (36.4 C)  TempSrc: Oral Oral  Oral  SpO2: 94% 92% 93% 96%  Weight:      Height:        Intake/Output Summary (Last 24 hours) at 11/10/2017 1113 Last data filed at 11/10/2017 0408 Gross per 24 hour  Intake 2605.12 ml  Output 950 ml  Net 1655.12 ml   Filed Weights   11/08/17 1643 11/08/17 2314 11/09/17 0500  Weight: 180 lb (81.6 kg) 178 lb (80.7 kg) 178 lb (80.7 kg)    Telemetry    A fib with V pacing  - Personally Reviewed  ECG    No new - Personally Reviewed  Physical Exam   GEN: No acute distress.   Neck: No JVD Cardiac: RRR, no murmurs, rubs, or gallops.  Respiratory: Clear to auscultation bilaterally. No wheezes or rales GI: Soft, nontender, non-distended  MS: No edema; No deformity. Rt arm cellulitis with less redness and  edema, still cannot close fist. Neuro:  Nonfocal  Psych: Normal affect   Labs    Chemistry Recent Labs  Lab 11/08/17 1326 11/09/17 0002 11/10/17 0451  NA 137 136 139  K 5.2* 4.5 4.7  CL 104 104 107  CO2 22 21* 21*  GLUCOSE 88 145* 167*  BUN 63* 59* 53*  CREATININE 3.27* 3.26* 2.90*  CALCIUM 8.5* 7.9* 8.2*  GFRNONAA 14* 14* 17*  GFRAA 17* 17* 19*  ANIONGAP 11 11 11      Hematology Recent Labs  Lab 11/08/17 1326 11/09/17 0002 11/10/17 0451  WBC 14.4* 12.6* 14.5*  RBC 3.69* 3.74* 3.26*  HGB 11.7* 12.0* 10.5*  HCT 37.3* 37.7* 32.6*  MCV 101.1* 100.8* 100.0  MCH 31.7 32.1 32.2  MCHC 31.4 31.8 32.2  RDW 14.3 14.6 14.7  PLT 213 212 224    Cardiac EnzymesNo results for input(s): TROPONINI in the last 168 hours.  Recent Labs  Lab 11/08/17 1333  TROPIPOC 0.03     BNPNo results for input(s): BNP, PROBNP in the last 168 hours.   DDimer No results for input(s): DDIMER in the last 168 hours.   Radiology    Dg Chest 2 View  Result Date: 11/08/2017  CLINICAL DATA:  Shortness of breath.  Hypotension. EXAM: CHEST - 2 VIEW COMPARISON:  03/16/2016 and 12/03/2015 FINDINGS: Chronic cardiomegaly. Triple lead pacemaker in place. Slight pulmonary vascular redistribution to the upper lobes without pulmonary edema. The lungs are hyperinflated with flattening of the diaphragm. No definitive effusions. No acute bone abnormality. IMPRESSION: 1. Chronic cardiomegaly. 2.  Emphysema (ICD10-J43.9). 3. Slight pulmonary vascular congestion. Electronically Signed   By: Lorriane Shire M.D.   On: 11/08/2017 14:03   US Renal  Result Date: 11/09/2017 CLINICAL DATA:  84 year old male with acute renal injury. EXAM: RENAL / URINARY TRACT ULTRASOUND COMPLETE COMPARISON:  CT Abdomen and Pelvis 02/19/2015. FINDINGS: Right Kidney: Length: 10.3 centimeters. Echogenic, atrophied right kidney (image 3). No right hydronephrosis or renal mass. Left Kidney: Length: 11.5 centimeters. Echogenic, atrophied left  kidney (image 18) with no left hydronephrosis or renal mass. Bladder: Appears normal for degree of bladder distention. Prostate hypertrophy measuring 4.3 centimeters diameter appears stable since 2016. IMPRESSION: No acute findings. Chronic bilateral medical renal disease with renal cortical atrophy. Electronically Signed   By: Genevie Ann M.D.   On: 11/09/2017 16:08    Cardiac Studies   Echo today Pending.   Patient Profile     84 y.o. male with a hx of dilated cardiomyopathy, chronic systolic HF with EF of 09-60%, s/p Bi-V pacemaker followed by Dr. Caryl Comes, persistent atrial fibrillation w/ failed cardioversion 06/2016, chronic anticoagulation therapy with Eliquis, CKD and h/o CAD now admitted a few weeks after death of his wife with weakness, dizziness and AKI.  He has been hypotensive as well.  Assessment & Plan    Severe hypotension with hx of dilated CM chronic systolic HF --holding meds BP this AM 105/71 --IV fluids monitoring for HF --potential adrenal insuff given his chronic low grade prednisone --grief and broken heart syndrome with death of his wife.  --+ 2,806 (IV back to [facility])  Persistent atrial fib.   --stop Eliquis with AKI --once improved consider coumadin therapy  Dilated CM has BiV PPM  AKI  --Cr now 2.90 down from 3.27 on admit   Hallucinations last pm  palliative care to see today I believe      For questions or updates, please contact [facility] Please consult [email or web address] for contact info under Cardiology/STEMI.      Signed, Cecilie Kicks, NP  11/10/2017, 11:13 AM    Personally seen and examined. Agree with above.  No significant shortness of breath, no chest pain.  Hallucinations overnight.  GEN: Elderly, in no acute distress  HEENT: normal  Neck: no JVD, carotid bruits, or masses Cardiac: RRR; no murmurs, rubs, or gallops, mild upper and lower extremity edema  Respiratory:  clear to auscultation bilaterally, normal work of breathing GI: soft,  nontender, nondistended, + BS MS: no deformity or atrophy  Skin: warm and dry, no rash Neuro:  Alert and Oriented x 3, Strength and sensation are intact Psych: euthymic mood, full affect  Assessment and plan:  Chronic systolic heart failure with severe hypotension EF 20%  -He was comfortable laying flat when I was talking with him.  Update at 3:52 PM, became acutely short of breath, episode of flash pulmonary edema.  IV Lasix 80 mg has been administered.  BiPAP has been administered.  Discussion with family, DNI.  I would not escalate to pressors.  We had discussion earlier today about palliative care team.  Also hydrocortisone administration has been decreased.  Persistent atrial fibrillation -Off of Eliquis because of acute kidney injury.  Could consider Coumadin or resuming Eliquis if his creatinine remained stable however risks of bleeding probably outweigh benefit at this point  Hallucinations - Likely both steroid and sundowning.   I am concerned about his prognosis.  Candee Furbish, MD

## 2017-11-11 ENCOUNTER — Inpatient Hospital Stay (HOSPITAL_COMMUNITY): Payer: Medicare Other

## 2017-11-11 DIAGNOSIS — I481 Persistent atrial fibrillation: Secondary | ICD-10-CM

## 2017-11-11 LAB — BASIC METABOLIC PANEL
Anion gap: 12 (ref 5–15)
BUN: 52 mg/dL — ABNORMAL HIGH (ref 8–23)
CO2: 19 mmol/L — ABNORMAL LOW (ref 22–32)
Calcium: 8.5 mg/dL — ABNORMAL LOW (ref 8.9–10.3)
Chloride: 110 mmol/L (ref 98–111)
Creatinine, Ser: 2.85 mg/dL — ABNORMAL HIGH (ref 0.61–1.24)
GFR calc Af Amer: 20 mL/min — ABNORMAL LOW (ref >60)
GFR calc non Af Amer: 17 mL/min — ABNORMAL LOW (ref >60)
Glucose, Bld: 127 mg/dL — ABNORMAL HIGH (ref 70–99)
Potassium: 4.2 mmol/L (ref 3.5–5.1)
Sodium: 141 mmol/L (ref 135–145)

## 2017-11-11 LAB — CBC
HEMATOCRIT: 34.5 % — AB (ref 39.0–52.0)
Hemoglobin: 11 g/dL — ABNORMAL LOW (ref 13.0–17.0)
MCH: 31.7 pg (ref 26.0–34.0)
MCHC: 31.9 g/dL (ref 30.0–36.0)
MCV: 99.4 fL (ref 78.0–100.0)
PLATELETS: 248 10*3/uL (ref 150–400)
RBC: 3.47 MIL/uL — ABNORMAL LOW (ref 4.22–5.81)
RDW: 14.7 % (ref 11.5–15.5)
WBC: 12.5 10*3/uL — AB (ref 4.0–10.5)

## 2017-11-11 LAB — T3, FREE: T3, Free: 1.3 pg/mL — ABNORMAL LOW (ref 2.0–4.4)

## 2017-11-11 MED ORDER — LEVALBUTEROL HCL 1.25 MG/0.5ML IN NEBU
1.2500 mg | INHALATION_SOLUTION | Freq: Two times a day (BID) | RESPIRATORY_TRACT | Status: DC
  Administered 2017-11-12: 1.25 mg via RESPIRATORY_TRACT
  Filled 2017-11-11 (×2): qty 0.5

## 2017-11-11 MED ORDER — SIMETHICONE 80 MG PO CHEW: 80.0000 mg | CHEWABLE_TABLET | Freq: Four times a day (QID) | ORAL | Status: DC | PRN

## 2017-11-11 NOTE — Evaluation (Signed)
Physical Therapy Evaluation Patient Details Name: Levi Lewis MRN: [id number] DOB: [birth year] Today's Date: 11/11/2017   History of Present Illness  Pt is a 84 y/o male admitted secondary to feeling weak and dizzy with significant hypotension. Pt found to also have acute on chronic respiratory failure. PMH including but not limited to CHF, COPD, CAD, HTN and pacemaker placement in 2015.    Clinical Impression  Pt presented supine in bed with HOB elevated, awake and willing to participate in therapy session. Pt's son-in-law present and providing most history information. Prior to admission, pt was ambulating with RW and able to perform dressing/bathing independently. Son-in-law would assist pt out of bath for safety purposes. Pt currently very limited secondary to fatigue and increased WOB with activity. Pt on 5L of O2 throughout with SPO2 maintaining stable at 94-96%. Pt would continue to benefit from skilled physical therapy services at this time while admitted and after d/c to address the below listed limitations in order to improve overall safety and independence with functional mobility.     Follow Up Recommendations SNF    Equipment Recommendations  None recommended by PT    Recommendations for Other Services       Precautions / Restrictions Precautions Precautions: Fall Precaution Comments: monitor BP, SPO2, HR Restrictions Weight Bearing Restrictions: No      Mobility  Bed Mobility Overal bed mobility: Needs Assistance Bed Mobility: Supine to Sit;Sit to Supine     Supine to sit: Max assist Sit to supine: Max assist   General bed mobility comments: increased time and effort, assistance for bilateral LE movement onto and off of bed, assistance with trunk elevation, assistance with use of bed pads to position pt's hips at EOB  Transfers                 General transfer comment: unable to tolerate at this time as pt quickly fatigued sitting  EOB  Ambulation/Gait                Stairs            Wheelchair Mobility    Modified Rankin (Stroke Patients Only)       Balance Overall balance assessment: Needs assistance Sitting-balance support: Feet supported Sitting balance-Leahy Scale: Poor Sitting balance - Comments: pt required min A to maintain upright with bilateral UE supports Postural control: Posterior lean                                   Pertinent Vitals/Pain Pain Assessment: No/denies pain    Home Living Family/patient expects to be discharged to:: Skilled nursing facility                 Additional Comments: or potentially home with Hospice    Prior Function Level of Independence: Independent with assistive device(s)         Comments: pt ambulated with RW     Hand Dominance        Extremity/Trunk Assessment   Upper Extremity Assessment Upper Extremity Assessment: Generalized weakness    Lower Extremity Assessment Lower Extremity Assessment: Generalized weakness    Cervical / Trunk Assessment Cervical / Trunk Assessment: Kyphotic  Communication   Communication: HOH  Cognition Arousal/Alertness: Awake/alert Behavior During Therapy: Flat affect Overall Cognitive Status: Impaired/Different from baseline Area of Impairment: Memory  Memory: Decreased short-term memory                General Comments      Exercises     Assessment/Plan    PT Assessment Patient needs continued PT services  PT Problem List Decreased strength;Decreased activity tolerance;Decreased balance;Decreased mobility;Decreased coordination;Decreased knowledge of use of DME;Decreased safety awareness;Decreased knowledge of precautions;Cardiopulmonary status limiting activity       PT Treatment Interventions DME instruction;Gait training;Stair training;Functional mobility training;Therapeutic activities;Balance training;Therapeutic  exercise;Neuromuscular re-education;Patient/family education    PT Goals (Current goals can be found in the Care Plan section)  Acute Rehab PT Goals Patient Stated Goal: none stated PT Goal Formulation: With patient/family Time For Goal Achievement: 11/25/17 Potential to Achieve Goals: Fair    Frequency Min 2X/week   Barriers to discharge        Co-evaluation               AM-PAC PT "6 Clicks" Daily Activity  Outcome Measure Difficulty turning over in bed (including adjusting bedclothes, sheets and blankets)?: Unable Difficulty moving from lying on back to sitting on the side of the bed? : Unable Difficulty sitting down on and standing up from a chair with arms (e.g., wheelchair, bedside commode, etc,.)?: Unable Help needed moving to and from a bed to chair (including a wheelchair)?: Total Help needed walking in hospital room?: Total Help needed climbing 3-5 steps with a railing? : Total 6 Click Score: 6    End of Session Equipment Utilized During Treatment: Oxygen(5L) Activity Tolerance: Patient limited by fatigue Patient left: in bed;with call bell/phone within reach;with family/visitor present;Other (comment)(NT in room) Nurse Communication: Mobility status;Need for lift equipment PT Visit Diagnosis: Other abnormalities of gait and mobility (R26.89)    Time: 6333-5456 PT Time Calculation (min) (ACUTE ONLY): 24 min   Charges:   PT Evaluation $PT Eval Moderate Complexity: 1 Mod PT Treatments $Therapeutic Activity: 8-22 mins        [place], [place], DPT [place] 11/11/2017, 5:02 PM

## 2017-11-11 NOTE — Progress Notes (Signed)
Pt placed on 4L [place] to give him a break from [place]. Pt tolerating well at this time RR 24 SPO2 97. No distress noted

## 2017-11-11 NOTE — Progress Notes (Signed)
Progress Note  Patient Name: Levi Lewis Date of Encounter: 11/11/2017  Primary Cardiologist: Candee Furbish, MD   Subjective   Continues to be weak and fatigued.  Is breathing much better since yesterday.  Had an episode of pulmonary edema which improved with BiPAP.  Inpatient Medications    Scheduled Meds: . febuxostat  40 mg Oral Daily  . fenofibrate  54 mg Oral Daily  . furosemide  80 mg Intravenous Q12H  . gabapentin  300 mg Oral BID  . hydrocortisone sodium succinate  25 mg Intravenous Daily  . levalbuterol  1.25 mg Nebulization Q6H  . levothyroxine  75 mcg Oral QAC breakfast  . mouth rinse  15 mL Mouth Rinse BID  . omega-3 acid ethyl esters  2 g Oral Daily  . pantoprazole  40 mg Oral Daily  . vitamin B-12  100 mcg Oral Daily  . vitamin E  100 Units Oral Daily   Continuous Infusions: . sodium chloride Stopped (11/09/17 2316)  . piperacillin-tazobactam (ZOSYN)  IV 100 mL/hr at 11/11/17 0600   PRN Meds: acetaminophen **OR** acetaminophen, albuterol, ondansetron **OR** ondansetron (ZOFRAN) IV, senna-docusate, triazolam   Vital Signs    Vitals:   11/11/17 0239 11/11/17 0309 11/11/17 0500 11/11/17 0808  BP: 98/70   (!) 97/58  Pulse: 75   76  Resp: (!) 23   (!) 24  Temp: 98.7 F (37.1 C)   98.2 F (36.8 C)  TempSrc: Axillary   Oral  SpO2:  98%  97%  Weight:   184 lb (83.5 kg)   Height:        Intake/Output Summary (Last 24 hours) at 11/11/2017 1159 Last data filed at 11/11/2017 0900 Gross per 24 hour  Intake 247.44 ml  Output 1550 ml  Net -1302.56 ml   Filed Weights   11/08/17 2314 11/09/17 0500 11/11/17 0500  Weight: 178 lb (80.7 kg) 178 lb (80.7 kg) 184 lb (83.5 kg)    Telemetry    Atrial fibrillation, intermittent ventricular pacing- Personally Reviewed  ECG    Unknown- Personally Reviewed  Physical Exam   GEN: Well nourished, well developed, in no acute distress  HEENT: normal  Neck: no JVD, carotid bruits, or masses Cardiac: iRRR; no  murmurs, rubs, or gallops,no edema  Respiratory:  clear to auscultation bilaterally, normal work of breathing GI: soft, nontender, nondistended, + BS MS: no deformity or atrophy  Skin: warm and dry Neuro:  Strength and sensation are intact Psych: euthymic mood, full affect   Labs    Chemistry Recent Labs  Lab 11/09/17 0002 11/10/17 0451 11/11/17 0313  NA 136 139 141  K 4.5 4.7 4.2  CL 104 107 110  CO2 21* 21* 19*  GLUCOSE 145* 167* 127*  BUN 59* 53* 52*  CREATININE 3.26* 2.90* 2.85*  CALCIUM 7.9* 8.2* 8.5*  GFRNONAA 14* 17* 17*  GFRAA 17* 19* 20*  ANIONGAP 11 11 12      Hematology Recent Labs  Lab 11/09/17 0002 11/10/17 0451 11/11/17 0313  WBC 12.6* 14.5* 12.5*  RBC 3.74* 3.26* 3.47*  HGB 12.0* 10.5* 11.0*  HCT 37.7* 32.6* 34.5*  MCV 100.8* 100.0 99.4  MCH 32.1 32.2 31.7  MCHC 31.8 32.2 31.9  RDW 14.6 14.7 14.7  PLT 212 224 248    Cardiac EnzymesNo results for input(s): TROPONINI in the last 168 hours.  Recent Labs  Lab 11/08/17 1333  TROPIPOC 0.03     BNPNo results for input(s): BNP, PROBNP in the last 168 hours.  DDimer No results for input(s): DDIMER in the last 168 hours.   Radiology    US Renal  Result Date: 11/09/2017 CLINICAL DATA:  85 year old male with acute renal injury. EXAM: RENAL / URINARY TRACT ULTRASOUND COMPLETE COMPARISON:  CT Abdomen and Pelvis 02/19/2015. FINDINGS: Right Kidney: Length: 10.3 centimeters. Echogenic, atrophied right kidney (image 3). No right hydronephrosis or renal mass. Left Kidney: Length: 11.5 centimeters. Echogenic, atrophied left kidney (image 18) with no left hydronephrosis or renal mass. Bladder: Appears normal for degree of bladder distention. Prostate hypertrophy measuring 4.3 centimeters diameter appears stable since 2016. IMPRESSION: No acute findings. Chronic bilateral medical renal disease with renal cortical atrophy. Electronically Signed   By: Genevie Ann M.D.   On: 11/09/2017 16:08   Dg Chest Port 1  View  Result Date: 11/11/2017 CLINICAL DATA:  Follow-up pulmonary edema and acute respiratory failure EXAM: PORTABLE CHEST 1 VIEW COMPARISON:  11/10/2017 FINDINGS: Cardiac shadow remains enlarged. Pacing device is again seen. Vascular congestion is noted although the degree of interstitial edema has improved slightly in the interval from the prior exam. Bibasilar atelectasis and effusions are again identified. IMPRESSION: Site improvement in the degree of vascular congestion and edema. Electronically Signed   By: Inez Catalina M.D.   On: 11/11/2017 09:21   Dg Chest Port 1 View  Result Date: 11/10/2017 CLINICAL DATA:  Acute respiratory failure. EXAM: PORTABLE CHEST 1 VIEW COMPARISON:  Two-view chest x-ray 11/08/2017. FINDINGS: Heart is enlarged. Pacemaker is stable. Diffuse interstitial edema has increased. Bilateral pleural effusions are present. Bibasilar airspace disease is noted. IMPRESSION: 1. Cardiomegaly with new superimposed interstitial edema and bilateral pleural effusions compatible with congestive heart failure. 2. Bibasilar airspace disease likely reflects atelectasis. Electronically Signed   By: San Morelle M.D.   On: 11/10/2017 15:45    Cardiac Studies   Echo today Pending.   Patient Profile     84 y.o. male with a hx of dilated cardiomyopathy, chronic systolic HF with EF of 16-94%, s/p Bi-V pacemaker followed by Dr. Caryl Comes, persistent atrial fibrillation w/ failed cardioversion 06/2016, chronic anticoagulation therapy with Eliquis, CKD and h/o CAD now admitted a few weeks after death of his wife with weakness, dizziness and AKI.  He has been hypotensive as well.  Assessment & Plan    Severe hypotension history of dilated cardia myopathy and systolic heart failure.  Currently holding medications for heart failure due to hypotension.  Did have an episode of pulmonary edema improved with BiPAP.  He is diuresing well now after his IV fluids.  Persistent atrial fib.   Agree with  continuing to hold Eliquis.  Likely Ramya Vanbergen need Coumadin therapy.  Dilated CM has BiV PPM  AKI  Creatinine improving.  Hallucinations None overnight   Agree with palliative care consult     For questions or updates, please contact [place] Please consult [email or web address] for contact info under Cardiology/STEMI.      Signed, Shaquel Chavous Meredith Leeds, MD  11/11/2017, 11:59 AM

## 2017-11-11 NOTE — Evaluation (Signed)
Clinical/Bedside Swallow Evaluation Patient Details  Name: Levi Lewis MRN: [id number] Date of Birth: February 26, 1918  Today's Date: 11/11/2017 Time: SLP Start Time (ACUTE ONLY): 38 SLP Stop Time (ACUTE ONLY): 1025 SLP Time Calculation (min) (ACUTE ONLY): 18 min  Past Medical History:  Past Medical History:  Diagnosis Date  . Aortic insufficiency   . Arthritis    "back, knees, right leg" (03/15/2016)  . Atrial fibrillation ([place])   . CAD (coronary artery disease)   . Cardiomyopathy, EF by echo 25-30% 07/14/2013  . CHF (congestive heart failure) ([place])   . Chronic lower back pain   . COPD (chronic obstructive pulmonary disease) ([place])   . Dyslipidemia   . GERD (gastroesophageal reflux disease)   . Gout   . Hypertension   . Hypothyroidism   . Left bundle branch block   . Lung nodules   . Mitral regurgitation   . On home oxygen therapy    "2L; just at night now" (11/09/2017)  . Pacemaker   . Pneumonia    "2-3 times" (03/15/2016)  . RBBB   . Shortness of breath   . Shoulder pain    left; limited ROM  . Skin cancer    "left side of my nose; had another one maybe off my back" (03/15/2016)   Past Surgical History:  Past Surgical History:  Procedure Laterality Date  . BACK SURGERY    . BI-VENTRICULAR PACEMAKER INSERTION (CRT-P)  07-15-2013   STJ Quadra Allura CRTP implanted by Dr Caryl Comes for NICM, CHF, alternating bundle branch blocks  . CARDIOVERSION N/A 09/13/2013   Procedure: CARDIOVERSION;  Surgeon: Carlena Bjornstad, MD;  Location[facility] ENDOSCOPY;  Service: Cardiovascular;  Laterality: N/A;  . CARDIOVERSION N/A 10/17/2013   Procedure: CARDIOVERSION;  Surgeon: Lelon Perla, MD;  Location: [place];  Service: Cardiovascular;  Laterality: N/A;  . CARDIOVERSION N/A 07/04/2016   Procedure: CARDIOVERSION;  Surgeon: Sanda Klein, MD;  Location: [facility] ENDOSCOPY;  Service: Cardiovascular;  Laterality: N/A;  . CATARACT EXTRACTION W/ INTRAOCULAR LENS  IMPLANT, BILATERAL Bilateral   .  CORONARY ANGIOPLASTY    . INSERT / REPLACE / REMOVE PACEMAKER    . KNEE ARTHROSCOPY Right   . LUMBAR DISC SURGERY    . PERMANENT PACEMAKER INSERTION N/A 07/15/2013   Procedure: PERMANENT PACEMAKER INSERTION;  Surgeon: Deboraha Sprang, MD;  Location: [facility] CATH LAB;  Service: Cardiovascular;  Laterality: N/A;  . SHOULDER ARTHROSCOPY Right   . THROAT SURGERY  ~ 1950   "exploratory; felt like I had something in my throat & they went in to look around"  . TONSILLECTOMY     HPI:  Pt is a 84 y.o. male with a history of PNA, lung nodules, on home O2, GERD, COPD, chronic HFrEF, gout/arthritis on chronic prednisone, persistent AFib s/p BiV PPM, and stage III CKD who presented to the ED about 3 weeks following the death of his wife of 41 years with right arm pain, swelling and low blood pressure associated with generalized weakness. He was admitted for dehydration causing hypotension, AKI. UA and CXR without acute infection. Pt had an episode of acute respiratory distress 7/27 characterized by MD note as wheezing that started abruptly during lunch, rattling sounds in his throat, difficulty catching his breath. Pt/family denied any choking or trouble swallowing. Initial differential dx included possible aspiration event, but CXR more consistent with pulmonary edema. Swallow evaluation ordered.   Assessment / Plan / Recommendation Clinical Impression  Pt's oropharyngeal swallow appears WFL, although he does have  a mildly increased risk for aspiration given h/o COPD/GERD and his current respiratory status. His RR intermittently reaches 30-31, with Min cues given by SLP for rest breaks. At this point he remains afebrile and MD note on previous date indicates that rapid response event on previous date may have been more related to pulmonary edema. Recommend continuing current diet but with rest breaks PRN, aspiration/esophageal precautions. Education was provided to pt and family about risk factors and strategies to  maximize safety. SLP will continue to follow briefly.  SLP Visit Diagnosis: Dysphagia, unspecified (R13.10)    Aspiration Risk  Mild aspiration risk    Diet Recommendation Regular;Thin liquid   Liquid Administration via: Cup;Straw Medication Administration: Whole meds with liquid Supervision: Staff to assist with self feeding;Full supervision/cueing for compensatory strategies Compensations: Slow rate;Small sips/bites;Follow solids with liquid;Other (Comment)(take frequent rest breaks) Postural Changes: Seated upright at 90 degrees;Remain upright for at least 30 minutes after po intake    Other  Recommendations Oral Care Recommendations: Oral care BID   Follow up Recommendations None      Frequency and Duration min 2x/week  1 week       Prognosis        Swallow Study   General HPI: Pt is a 84 y.o. male with a history of PNA, lung nodules, on home O2, GERD, COPD, chronic HFrEF, gout/arthritis on chronic prednisone, persistent AFib s/p BiV PPM, and stage III CKD who presented to the ED about 3 weeks following the death of his wife of 93 years with right arm pain, swelling and low blood pressure associated with generalized weakness. He was admitted for dehydration causing hypotension, AKI. UA and CXR without acute infection. Pt had an episode of acute respiratory distress 7/27 characterized by MD note as wheezing that started abruptly during lunch, rattling sounds in his throat, difficulty catching his breath. Pt/family denied any choking or trouble swallowing. Initial differential dx included possible aspiration event, but CXR more consistent with pulmonary edema. Swallow evaluation ordered. Type of Study: Bedside Swallow Evaluation Previous Swallow Assessment: none in chart Diet Prior to this Study: Regular;Thin liquids Temperature Spikes Noted: No Respiratory Status: Nasal cannula(5L) History of Recent Intubation: No Behavior/Cognition: Alert;Pleasant mood;Cooperative Oral Cavity  Assessment: Within Functional Limits Oral Care Completed by SLP: No Oral Cavity - Dentition: Missing dentition Vision: Functional for self-feeding Self-Feeding Abilities: Needs assist Patient Positioning: Upright in bed Baseline Vocal Quality: Low vocal intensity Volitional Cough: Strong Volitional Swallow: Able to elicit    Oral/Motor/Sensory Function Overall Oral Motor/Sensory Function: (reduced palatal elevation bilaterally)   Ice Chips Ice chips: Not tested   Thin Liquid Thin Liquid: Within functional limits Presentation: Cup;Self Fed;Straw    Nectar Thick Nectar Thick Liquid: Not tested   Honey Thick Honey Thick Liquid: Not tested   Puree Puree: Within functional limits Presentation: Spoon   Solid     Solid: Within functional limits Presentation: Self [place] 11/11/2017,10:34 AM  Germain Osgood, M.A. CCC-SLP [phone]

## 2017-11-11 NOTE — Progress Notes (Signed)
PROGRESS NOTE  Levi Lewis  HLK:[id number] DOB: Sep 08, 1917 DOA: 11/08/2017 PCP: [name], MD  Outpatient Specialists: Cardiology, [facility] Brief Narrative: Levi Lewis is a 84 y.o. male with a history of chronic HFrEF, gout/arthritis on chronic prednisone, persistent AFib s/p BiV PPM and stage III CKD who presented to the ED about 3 weeks following the death of his wife of 77 years with right arm pain, swelling and low blood pressure associated with generalized weakness. WBC 14k, Cr up to 3.2 from baseline of 2, UA and CXR without acute infection. He was admitted for dehydration causing hypotension with some improvement after IV fluids and given ceftriaxone for right arm cellulitis. U/S ruled out DVT/SVT. He also required I/O cath for urinary retention which is resolved. AKI was also noted, improved with IVF's. Renal U/S showed cortical atrophy and no hydronephrosis. Cardiology was consulted and confirmed that the patient is not a candidate for advanced HF therapies, echocardiogram showing severely hypokinetic LV with EF 20-25% as well as biatral enlargement and valvular disease.  Assessment & Plan: Principal Problem:   Hypotension Active Problems:   CAD (coronary artery disease)   Atrial fibrillation (HCC)   Pacemaker   Chronic systolic CHF (congestive heart failure) (HCC)   Acute on chronic renal failure (HCC)   Arm swelling right   Acute on chronic hypoxic respiratory failure, COPD, pulmonary edema: Developed severe respiratory distress 7/26 with flash pulmonary edema, slowly improving. See notes for further details, ABG reassuring last night. - Continue SDU level of care, BiPAP prn, continue supplemental oxygen - Continue breathing treatments prn. On steroids as well.  - DC zosyn (initial concern for aspiration but no fever, cough, or choking, mild aspiration risk per SLP evaluation)  Hypotension: Possibly related to cellulitis with relative adrenal insufficiency vs. progressive  heart failure/broken heart syndrome. No evidence of bleeding. Note low-normal BPs as outpatient. Leukocytosis not likely to be reliable in setting of steroids. TSH is suppressed but T4 is normal.  - Cardiology consulted in light of cardiac history, appreciate their recommendations.  - Random cortisol was borderline, hyperkalemia (due to relative mineralocorticoid deficiency vs. AKI), so empirically started stress-dose steroids, tapering quickly given his worsening edema. Now 25mg  daily, monitor BP closely.   Right arm cellulitis: No evidence of clot on U/S. Exam is not consistent with gout as primary process, though will be starting stress steroids regardless.  - Continue ceftriaxone (no purulent component).  - Elevate right arm - No blood cultures drawn at admission, would be low yield now.  Acute on chronic systolic CHF: EF 73-42% stable this admission. Unable to determine diastolic function, though severe biatrial enlargement, mod-severe MR, mild AR, moderate TR with moderately increased PASP. No focal LV ballooning, etc. to definitively determine takotsubo.  - Cardiology confirms pt not candidate for advanced therapies. Holding beta blocker with hypotension, no ACE/ARB due to renal failure.  - Lasix 80mg  IV BID. Fortunately, creatinine stable, CXR this AM shows slight improvement.   AKI on stage IV CKD: Suspected to be due to intravascular volume depletion. Improved with IVF's. Possibly hemodynamically mediated with ongoing hypotension, as well as due to NSAID nephropathy (taking etodolac multiple times/day for right arm pain), possibly urinary retention as well (resolved) and ?cardiorenal. Renal U/S with cortical thinning suggestive of known CKD without hydronephrosis.  Discussed that not a HD candidate were renal function to decline. - Stopped eliquis. May consider coumadin pending renal function vs. hold anticoagulation.  - Monitor Cr and UOP. Inserted foley given acuity  of illness and need for  UOP monitoring with aggressive diuresis.  Acute delirium: Hallucinations, disorientation waxing/waning worst at night. Counseled pt and family that this is difficult to treat at his age and we're most interested in mitigating complications/injury. - Discussed delirium precautions with pt and family. Family to stay at bedside as much as possible.  - Continue pt's home qHS triazolam. - Avoiding QT prolonging agents as able due to prolonged QT and episode suggestive of R on T seen on telemetry. If comfort measures are desired, would start low dose zyprexa qHS. - Tapering steroids as quickly as reasonable. Though concern for steroid-induced psychosis lower than that for delirium with abrupt return to normal this AM.  Acute urinary retention due to BPH: Prostate enlargement (4.3cm) on U/S is stable.  - Continue foley catheter, holding flomax with hypotension.  AFib, has BiV pacer for resynchronization therapy:  - Monitor HR, holding beta blocker - Stop eliquis with significant risk of bleeding and current renal failure  Grief reaction: Appropriate bereavement with wife of 30 years passing earlier this month, currently lives alone.  - Pt's pastor has been by to see him.  - Will get visit from [place] following his wife's passing next week.  Gout: Chronic.  - Continue febuxostat (continued from outpatient setting), can discuss with patient the recent findings of CV mortality with this drug.  - On steroids as above, due to chronic prednisone, will need prolonged taper  Hypothyroidism:  - Continue synthroid  GERD:  - PPI  DVT prophylaxis: SCDs Code Status: Full Family Communication: Son, daughter, in-laws at bedside. Has POA paperwork signed by pt in 2005 stating his children to be POA.  Disposition Plan: Uncertain. Palliative care consult appreciated.  Consultants:   Cardiology  Palliative care team  Procedures:   None  Antimicrobials:  Zosyn 7/24, 7/26  Ceftriaxone 7/24 >>     Subjective: Pt improved significantly over past 12 hours coming off BiPAP. Some hallucinations last night, could not sleep at all. No hallucinations currently. No chest pain. No fever or cough.  Objective: Vitals:   11/11/17 0309 11/11/17 0500 11/11/17 0808 11/11/17 1149  BP:   (!) 97/58 101/60  Pulse:   76 76  Resp:   (!) 24 (!) 23  Temp:   98.2 F (36.8 C) (!) 97.4 F (36.3 C)  TempSrc:   Oral Oral  SpO2: 98%  97% 93%  Weight:  83.5 kg (184 lb)    Height:        Intake/Output Summary (Last 24 hours) at 11/11/2017 1328 Last data filed at 11/11/2017 0900 Gross per 24 hour  Intake 247.44 ml  Output 1050 ml  Net -802.56 ml   Filed Weights   11/08/17 2314 11/09/17 0500 11/11/17 0500  Weight: 80.7 kg (178 lb) 80.7 kg (178 lb) 83.5 kg (184 lb)   Gen: Elderly frail male in no distress Pulm: Nonlabored tachypnea with supplemental oxygen, significant improvement in coarse breath sounds. No wheezing or crackles. CV: Irreg, frequent V pacing. No murmur, rub, or gallop. No JVD, trace LE dependent edema. GI: Abdomen soft, non-tender, non-distended, with normoactive bowel sounds.  Ext: Warm, no deformities Skin: No new rashes. right arm with diffuse edema stable from prior, tenderness improved, erythema stable. Worst swelling on dorsum of right hand. Neuro: Alert and oriented. No focal neurological deficits. Psych: Judgement and insight appear fair. Mood euthymic & affect congruent. Behavior is appropriate.    CBC: Recent Labs  Lab 11/08/17 1326 11/09/17 0002 11/10/17 0451  11/11/17 0313  WBC 14.4* 12.6* 14.5* 12.5*  HGB 11.7* 12.0* 10.5* 11.0*  HCT 37.3* 37.7* 32.6* 34.5*  MCV 101.1* 100.8* 100.0 99.4  PLT 213 212 224 937   Basic Metabolic Panel: Recent Labs  Lab 11/08/17 1326 11/09/17 0002 11/10/17 0451 11/11/17 0313  NA 137 136 139 141  K 5.2* 4.5 4.7 4.2  CL 104 104 107 110  CO2 22 21* 21* 19*  GLUCOSE 88 145* 167* 127*  BUN 63* 59* 53* 52*  CREATININE 3.27*  3.26* 2.90* 2.85*  CALCIUM 8.5* 7.9* 8.2* 8.5*   GFR: Estimated Creatinine Clearance: 14.9 mL/min (A) (by C-G formula based on SCr of 2.85 mg/dL (H)).   Thyroid Function Tests: Recent Labs    11/10/17 0442 11/10/17 0751 11/10/17 0815  TSH 0.169*  --   --   FREET4  --  0.98  --   T3FREE  --   --  1.3*   Urine analysis:    Component Value Date/Time   COLORURINE YELLOW 11/08/2017 1646   APPEARANCEUR CLEAR 11/08/2017 1646   LABSPEC 1.006 11/08/2017 1646   PHURINE 5.0 11/08/2017 1646   GLUCOSEU NEGATIVE 11/08/2017 1646   HGBUR NEGATIVE 11/08/2017 1646   BILIRUBINUR NEGATIVE 11/08/2017 1646   KETONESUR NEGATIVE 11/08/2017 1646   PROTEINUR NEGATIVE 11/08/2017 1646   UROBILINOGEN 1.0 02/19/2015 1815   NITRITE NEGATIVE 11/08/2017 1646   LEUKOCYTESUR NEGATIVE 11/08/2017 1646   Radiology Studies: US Renal  Result Date: 11/09/2017 CLINICAL DATA:  84 year old male with acute renal injury. EXAM: RENAL / URINARY TRACT ULTRASOUND COMPLETE COMPARISON:  CT Abdomen and Pelvis 02/19/2015. FINDINGS: Right Kidney: Length: 10.3 centimeters. Echogenic, atrophied right kidney (image 3). No right hydronephrosis or renal mass. Left Kidney: Length: 11.5 centimeters. Echogenic, atrophied left kidney (image 18) with no left hydronephrosis or renal mass. Bladder: Appears normal for degree of bladder distention. Prostate hypertrophy measuring 4.3 centimeters diameter appears stable since 2016. IMPRESSION: No acute findings. Chronic bilateral medical renal disease with renal cortical atrophy. Electronically Signed   By: Genevie Ann M.D.   On: 11/09/2017 16:08   Dg Chest Port 1 View  Result Date: 11/11/2017 CLINICAL DATA:  Follow-up pulmonary edema and acute respiratory failure EXAM: PORTABLE CHEST 1 VIEW COMPARISON:  11/10/2017 FINDINGS: Cardiac shadow remains enlarged. Pacing device is again seen. Vascular congestion is noted although the degree of interstitial edema has improved slightly in the interval from the  prior exam. Bibasilar atelectasis and effusions are again identified. IMPRESSION: Site improvement in the degree of vascular congestion and edema. Electronically Signed   By: Inez Catalina M.D.   On: 11/11/2017 09:21   Dg Chest Port 1 View  Result Date: 11/10/2017 CLINICAL DATA:  Acute respiratory failure. EXAM: PORTABLE CHEST 1 VIEW COMPARISON:  Two-view chest x-ray 11/08/2017. FINDINGS: Heart is enlarged. Pacemaker is stable. Diffuse interstitial edema has increased. Bilateral pleural effusions are present. Bibasilar airspace disease is noted. IMPRESSION: 1. Cardiomegaly with new superimposed interstitial edema and bilateral pleural effusions compatible with congestive heart failure. 2. Bibasilar airspace disease likely reflects atelectasis. Electronically Signed   By: San Morelle M.D.   On: 11/10/2017 15:45    LOS: 3 days   Time spent: 35 minutes.  Patrecia Pour, MD [facility] [email or web address] Password [email or web address] 11/11/2017, 1:28 PM

## 2017-11-12 ENCOUNTER — Inpatient Hospital Stay (HOSPITAL_COMMUNITY): Payer: Medicare Other

## 2017-11-12 DIAGNOSIS — Z515 Encounter for palliative care: Secondary | ICD-10-CM

## 2017-11-12 DIAGNOSIS — N179 Acute kidney failure, unspecified: Secondary | ICD-10-CM

## 2017-11-12 LAB — BASIC METABOLIC PANEL
ANION GAP: 13 (ref 5–15)
BUN: 47 mg/dL — ABNORMAL HIGH (ref 8–23)
CO2: 23 mmol/L (ref 22–32)
Calcium: 8.1 mg/dL — ABNORMAL LOW (ref 8.9–10.3)
Chloride: 105 mmol/L (ref 98–111)
Creatinine, Ser: 2.46 mg/dL — ABNORMAL HIGH (ref 0.61–1.24)
GFR calc Af Amer: 24 mL/min — ABNORMAL LOW (ref >60)
GFR, EST NON AFRICAN AMERICAN: 20 mL/min — AB (ref >60)
GLUCOSE: 103 mg/dL — AB (ref 70–99)
POTASSIUM: 3.2 mmol/L — AB (ref 3.5–5.1)
SODIUM: 141 mmol/L (ref 135–145)

## 2017-11-12 MED ORDER — ALPRAZOLAM 0.25 MG PO TABS
0.2500 mg | ORAL_TABLET | Freq: Once | ORAL | Status: AC
  Administered 2017-11-12: 0.25 mg via ORAL
  Filled 2017-11-12: qty 1

## 2017-11-12 MED ORDER — POTASSIUM CHLORIDE CRYS ER 20 MEQ PO TBCR
20.0000 meq | EXTENDED_RELEASE_TABLET | Freq: Three times a day (TID) | ORAL | Status: AC
  Administered 2017-11-12 (×3): 20 meq via ORAL
  Filled 2017-11-12 (×3): qty 1

## 2017-11-12 MED ORDER — LEVALBUTEROL HCL 1.25 MG/0.5ML IN NEBU
1.2500 mg | INHALATION_SOLUTION | Freq: Two times a day (BID) | RESPIRATORY_TRACT | Status: DC
  Administered 2017-11-13 – 2017-11-15 (×5): 1.25 mg via RESPIRATORY_TRACT
  Filled 2017-11-12 (×5): qty 0.5

## 2017-11-12 MED ORDER — LEVALBUTEROL HCL 0.63 MG/3ML IN NEBU
0.6300 mg | INHALATION_SOLUTION | Freq: Four times a day (QID) | RESPIRATORY_TRACT | Status: DC | PRN
Start: 2017-11-12 — End: 2017-11-16

## 2017-11-12 NOTE — Consult Note (Signed)
Consultation Note Date: 11/12/2017   Patient Name: Levi Lewis  DOB: 1918/02/20  MRN: [id number]  Age / Sex: 84 y.o., male  PCP: [name], MD Referring Physician: Patrecia Pour, MD  Reason for Consultation: Establishing goals of care, Non pain symptom management and Psychosocial/spiritual support  HPI/Patient Profile: 84 y.o. male  with past medical history of COPD on home oxygen, CHF EF 20 - 25%, permanent atrial fibrillation, CKD stg 4, and aortic insufficiency who was admitted on 11/08/2017 with hypotension, dizziness, acute on chronic kidney injury and right arm cellulitis.  Mr. Nine has had difficulty with acute delirium during his hospital stay.  He has also required intermittent BiPAP.  PMT has been consulted for goals of care.   Clinical Assessment and Goals of Care:  I have reviewed medical records including EPIC notes, labs and imaging, received report from Dr. Bonner Puna, assessed the patient and then met at the bedside along with his daughter and son in law  to discuss diagnosis prognosis, GOC, EOL wishes, disposition and options.  I introduced Palliative Medicine as specialized medical care for people living with serious illness. It focuses on providing relief from the symptoms and stress of a serious illness. The goal is to improve quality of life for both the patient and the family.  We discussed a brief life review of the patient. 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  He grew up in the [place] area and was married to his wife for 75 years until she passed away in the beginning of this month (10/2017).  He has two living children who are his HCPOA, 6 grand children and many great grand children.  He was living at home alone prior to this hospital admission.  Reportedly his appetite was good.  The patient tells he he could not walk much but was able to get from  room to room on his walker.    In the hospital his appetite has been decreased.  He was able to sit on the side of the bed with PT yesterday but was unable to get up.    I attempted to elicit values and goals of care important to the patient.  "I would like to live as long as I can as long as I am not crippled.  I want to be able to get out of bed and move about."  The patient's preference is to be at home rather than in SNF.    Family has recently been thru their mother dying at home on hospice services.  They have also had another family member pass away at [facility] on hospice services.  Fortunately they are familiar with these situations.    We discussed his current illness and what it means in the larger context of his on-going co-morbidities.  Natural disease trajectory and expectations at EOL were discussed.  Specifically we discussed stage 4 CKD with advanced heart disease.  We are balancing these things and attempting to mitigate their  effect on his kidneys.  The difference between aggressive medical intervention and comfort care was considered in light of the patient's goals of care. The family would like to consider this further with the patient.  Questions include:  (1) do you want to use BiPAP if needed; (2) do you want improved pain control for your right arm even though it may effect your BP? (3) would you want medication for sleep/appetite/delirium even if it may effect your heart rhythm? (4) what is the best environment to go to after discharge?  Hospice and Palliative Care services outpatient were explained and offered.    Primary Decision Maker:  PATIENT.  HCPOA are is son and daughter    SUMMARY OF RECOMMENDATIONS    PMT will continue to follow.  Code Status/Advance Care Planning:  DNR   Symptom Management:   Will hold recommendations for now until family further considers goals of care.  Palliative Prophylaxis:   Delirium Protocol  Psycho-social/Spiritual:     Desire for further Chaplaincy support:  yes  Prognosis:  High risk for acute decline given advanced heart failure, CKD 4, and COPD.  Now with limited mobility and right arm cellulitis.  Barring an acute decline, prognosis is likely weeks to months.    Discharge Planning: To Be Determined  SNF with Palliative vs Home with Hospice.      Primary Diagnoses: Present on Admission: . Hypotension . CAD (coronary artery disease) . Atrial fibrillation ([place]) . Pacemaker . Chronic systolic CHF (congestive heart failure) ([place])   I have reviewed the medical record, interviewed the patient and family, and examined the patient. The following aspects are pertinent.  Past Medical History:  Diagnosis Date  . Aortic insufficiency   . Arthritis    "back, knees, right leg" (03/15/2016)  . Atrial fibrillation ([place])   . CAD (coronary artery disease)   . Cardiomyopathy, EF by echo 25-30% 07/14/2013  . CHF (congestive heart failure) ([place])   . Chronic lower back pain   . COPD (chronic obstructive pulmonary disease) ([place])   . Dyslipidemia   . GERD (gastroesophageal reflux disease)   . Gout   . Hypertension   . Hypothyroidism   . Left bundle branch block   . Lung nodules   . Mitral regurgitation   . On home oxygen therapy    "2L; just at night now" (11/09/2017)  . Pacemaker   . Pneumonia    "2-3 times" (03/15/2016)  . RBBB   . Shortness of breath   . Shoulder pain    left; limited ROM  . Skin cancer    "left side of my nose; had another one maybe off my back" (03/15/2016)   Social History   Socioeconomic History  . Marital status: Widowed    Spouse name: Not on file  . Number of children: Not on file  . Years of education: Not on file  . Highest education level: Not on file  Occupational History  . Not on file  Social Needs  . Financial resource strain: Not on file  . Food insecurity:    Worry: Not on file    Inability: Not on file  . Transportation needs:    Medical: Not on  file    Non-medical: Not on file  Tobacco Use  . Smoking status: Never Smoker  . Smokeless tobacco: Never Used  Substance and Sexual Activity  . Alcohol use: No    Alcohol/week: 0.0 oz  . Drug use: No  . Sexual activity: Not  Currently  Lifestyle  . Physical activity:    Days per week: Not on file    Minutes per session: Not on file  . Stress: Not on file  Relationships  . Social connections:    Talks on phone: Not on file    Gets together: Not on file    Attends religious service: Not on file    Active member of club or organization: Not on file    Attends meetings of clubs or organizations: Not on file    Relationship status: Not on file  Other Topics Concern  . Not on file  Social History Narrative   He has never smoked or drank. No illcit drug use . He is retired from the [employer] and lives with his wife. Illicit Drug Use- no   Family History  Problem Relation Age of Onset  . Heart attack Sister   . Lung cancer Brother        smoker  . Colon cancer Neg Hx    Scheduled Meds: . febuxostat  40 mg Oral Daily  . fenofibrate  54 mg Oral Daily  . furosemide  80 mg Intravenous Q12H  . gabapentin  300 mg Oral BID  . hydrocortisone sodium succinate  25 mg Intravenous Daily  . levalbuterol  1.25 mg Nebulization BID  . levothyroxine  75 mcg Oral QAC breakfast  . mouth rinse  15 mL Mouth Rinse BID  . omega-3 acid ethyl esters  2 g Oral Daily  . pantoprazole  40 mg Oral Daily  . potassium chloride  20 mEq Oral TID  . vitamin B-12  100 mcg Oral Daily  . vitamin E  100 Units Oral Daily   Continuous Infusions: . sodium chloride Stopped (11/09/17 2316)   PRN Meds:.acetaminophen **OR** acetaminophen, albuterol, senna-docusate, simethicone, triazolam No Known Allergies Review of Systems complains of right arm pain, fatigue, weakness  Physical Exam  Well developed elderly male, awake, alert NAD. Resp no w/c/r , no increased work of breathing CV irreg irreg Abdomen  soft, nt,nd  Vital Signs: BP 115/79   Pulse 84   Temp 98.1 F (36.7 C) (Oral)   Resp 20   Ht 6' (1.829 m)   Wt 81.6 kg (180 lb)   SpO2 97%   BMI 24.41 kg/m  Pain Scale: 0-10   Pain Score: 2    SpO2: SpO2: 97 % O2 Device:SpO2: 97 % O2 Flow Rate: .O2 Flow Rate (L/min): 5 L/min  IO: Intake/output summary:   Intake/Output Summary (Last 24 hours) at 11/12/2017 0934 Last data filed at 11/12/2017 0545 Gross per 24 hour  Intake 586.54 ml  Output 3300 ml  Net -2713.46 ml    LBM: Last BM Date: 11/11/17 Baseline Weight: Weight: 81.6 kg (180 lb) Most recent weight: Weight: 81.6 kg (180 lb)     Palliative Assessment/Data:  30%     Time In: 8:30 Time Out: 10:00 Time Total: 90 min. Greater than 50%  of this time was spent counseling and coordinating care related to the above assessment and plan.  Signed by: Florentina Jenny, PA-C Palliative Medicine Pager: [phone]  Please contact Palliative Medicine Team phone at [phone] for questions and concerns.  For individual provider: See Shea Evans

## 2017-11-12 NOTE — Progress Notes (Signed)
During morning assessment pt stated to RN "it feels harder to breath this morning" pts. RR 22 with labored shallow respirations, Dr Bonner Puna notified PRN breathing treatment given will continue to monitor

## 2017-11-12 NOTE — Progress Notes (Signed)
Progress Note  Patient Name: Levi Lewis Date of Encounter: 11/12/2017  Primary Cardiologist: Candee Furbish, MD   Subjective   Did well overnight without major issue.  Complains of mild shortness of breath today.  Has diuresed 1 L since admission.  Inpatient Medications    Scheduled Meds: . febuxostat  40 mg Oral Daily  . fenofibrate  54 mg Oral Daily  . furosemide  80 mg Intravenous Q12H  . gabapentin  300 mg Oral BID  . hydrocortisone sodium succinate  25 mg Intravenous Daily  . levalbuterol  1.25 mg Nebulization BID  . levothyroxine  75 mcg Oral QAC breakfast  . mouth rinse  15 mL Mouth Rinse BID  . omega-3 acid ethyl esters  2 g Oral Daily  . pantoprazole  40 mg Oral Daily  . vitamin B-12  100 mcg Oral Daily  . vitamin E  100 Units Oral Daily   Continuous Infusions: . sodium chloride Stopped (11/09/17 2316)   PRN Meds: acetaminophen **OR** acetaminophen, albuterol, senna-docusate, simethicone, triazolam   Vital Signs    Vitals:   11/12/17 0022 11/12/17 0155 11/12/17 0555 11/12/17 0606  BP:  (!) 113/97 (!) 98/56   Pulse:  62 71   Resp:  (!) 34 (!) 21   Temp: 97.6 F (36.4 C)     TempSrc: Oral     SpO2:  92% 95%   Weight:    180 lb (81.6 kg)  Height:        Intake/Output Summary (Last 24 hours) at 11/12/2017 0804 Last data filed at 11/12/2017 0545 Gross per 24 hour  Intake 726.54 ml  Output 3300 ml  Net -2573.46 ml   Filed Weights   11/11/17 0500 11/11/17 2032 11/12/17 0606  Weight: 184 lb (83.5 kg) 221 lb 12.5 oz (100.6 kg) 180 lb (81.6 kg)    Telemetry    Atrial fibrillation, intermittent ventricular pacing- Personally Reviewed  ECG    None new- Personally Reviewed  Physical Exam   GEN: Well nourished, well developed, in no acute distress  HEENT: normal  Neck: no JVD, carotid bruits, or masses Cardiac: iRRR; no murmurs, rubs, or gallops,no edema  Respiratory:  clear to auscultation bilaterally, normal work of breathing GI: soft,  nontender, nondistended, + BS MS: no deformity or atrophy  Skin: warm and dry Neuro:  Strength and sensation are intact Psych: euthymic mood, full affect    Labs    Chemistry Recent Labs  Lab 11/09/17 0002 11/10/17 0451 11/11/17 0313  NA 136 139 141  K 4.5 4.7 4.2  CL 104 107 110  CO2 21* 21* 19*  GLUCOSE 145* 167* 127*  BUN 59* 53* 52*  CREATININE 3.26* 2.90* 2.85*  CALCIUM 7.9* 8.2* 8.5*  GFRNONAA 14* 17* 17*  GFRAA 17* 19* 20*  ANIONGAP 11 11 12      Hematology Recent Labs  Lab 11/09/17 0002 11/10/17 0451 11/11/17 0313  WBC 12.6* 14.5* 12.5*  RBC 3.74* 3.26* 3.47*  HGB 12.0* 10.5* 11.0*  HCT 37.7* 32.6* 34.5*  MCV 100.8* 100.0 99.4  MCH 32.1 32.2 31.7  MCHC 31.8 32.2 31.9  RDW 14.6 14.7 14.7  PLT 212 224 248    Cardiac EnzymesNo results for input(s): TROPONINI in the last 168 hours.  Recent Labs  Lab 11/08/17 1333  TROPIPOC 0.03     BNPNo results for input(s): BNP, PROBNP in the last 168 hours.   DDimer No results for input(s): DDIMER in the last 168 hours.   Radiology  Dg Chest Port 1 View  Result Date: 11/11/2017 CLINICAL DATA:  Follow-up pulmonary edema and acute respiratory failure EXAM: PORTABLE CHEST 1 VIEW COMPARISON:  11/10/2017 FINDINGS: Cardiac shadow remains enlarged. Pacing device is again seen. Vascular congestion is noted although the degree of interstitial edema has improved slightly in the interval from the prior exam. Bibasilar atelectasis and effusions are again identified. IMPRESSION: Site improvement in the degree of vascular congestion and edema. Electronically Signed   By: Inez Catalina M.D.   On: 11/11/2017 09:21   Dg Chest Port 1 View  Result Date: 11/10/2017 CLINICAL DATA:  Acute respiratory failure. EXAM: PORTABLE CHEST 1 VIEW COMPARISON:  Two-view chest x-ray 11/08/2017. FINDINGS: Heart is enlarged. Pacemaker is stable. Diffuse interstitial edema has increased. Bilateral pleural effusions are present. Bibasilar airspace  disease is noted. IMPRESSION: 1. Cardiomegaly with new superimposed interstitial edema and bilateral pleural effusions compatible with congestive heart failure. 2. Bibasilar airspace disease likely reflects atelectasis. Electronically Signed   By: San Morelle M.D.   On: 11/10/2017 15:45    Cardiac Studies   Echo today Pending.   Patient Profile     84 y.o. male with a hx of dilated cardiomyopathy, chronic systolic HF with EF of 58-59%, s/p Bi-V pacemaker followed by Dr. Caryl Comes, persistent atrial fibrillation w/ failed cardioversion 06/2016, chronic anticoagulation therapy with Eliquis, CKD and h/o CAD now admitted a few weeks after death of his wife with weakness, dizziness and AKI.  He has been hypotensive as well.  Assessment & Plan    Severe hypotension blood pressure is continued to be low.  Fortunately he has not required BiPAP due to pulmonary edema.  He has diuresed approximately 1 L of fluid which likely Eulis Salazar continue to help with his respiratory status.  Creatinine has remained stable.  Continue with gentle diuresis.  Persistent atrial fib.   Agree with holding Eliquis.  Cloud Graham likely need Coumadin in the future.  Dilated CM has BiV PPM  AKI  Creatinine has remained stable  Hallucinations None overnight     For questions or updates, please contact [place] Please consult [email or web address] for contact info under Cardiology/STEMI.      Signed, Onna Nodal Meredith Leeds, MD  11/12/2017, 8:04 AM

## 2017-11-12 NOTE — Progress Notes (Signed)
PROGRESS NOTE  Levi Lewis  KZS:[id number] DOB: 08-Feb-1918 DOA: 11/08/2017 PCP: [name], MD  Outpatient Specialists: Cardiology, Marlou Porch Brief Narrative: Levi Lewis is a 84 y.o. male with a history of chronic HFrEF, gout/arthritis on chronic prednisone, persistent AFib s/p BiV PPM and stage III CKD who presented to the ED about 3 weeks following the death of his wife of 71 years with right arm pain, swelling and low blood pressure associated with generalized weakness. WBC 14k, Cr up to 3.2 from baseline of 2, UA and CXR without acute infection. He was admitted for dehydration causing hypotension with some improvement after IV fluids and given ceftriaxone for right arm cellulitis. U/S ruled out DVT/SVT. He also required I/O cath for urinary retention which is resolved. AKI was also noted, improved with IVF's. Renal U/S showed cortical atrophy and no hydronephrosis. Cardiology was consulted and confirmed that the patient is not a candidate for advanced HF therapies, echocardiogram showing severely hypokinetic LV with EF 20-25% as well as biatral enlargement and valvular disease.  Assessment & Plan: Principal Problem:   Hypotension Active Problems:   CAD (coronary artery disease)   Atrial fibrillation (HCC)   Pacemaker   Chronic systolic CHF (congestive heart failure) (HCC)   Acute on chronic renal failure (HCC)   Arm swelling right   AKI (acute kidney injury) ([place])   Palliative care encounter   Acute on chronic hypoxic respiratory failure, COPD, pulmonary edema: Developed severe respiratory distress 7/26 with flash pulmonary edema, slowly improving, still needing negative balance. DC zosyn (initial concern for aspiration but no fever, cough, or choking, mild aspiration risk per SLP evaluation) - Continue SDU level of care, BiPAP prn, continue supplemental oxygen - Continue breathing treatments prn. On steroids as well.  - Will need ongoing goals of care discussions.   Hypotension:  Possibly related to cellulitis with relative adrenal insufficiency vs. progressive heart failure/broken heart syndrome. No evidence of bleeding. Note low-normal BPs as outpatient. Leukocytosis not likely to be reliable in setting of steroids. TSH is suppressed but T4 is normal.  - Cardiology consulted in light of cardiac history, appreciate their recommendations.  - Stress steroids provided and tapered in light of worsening pulmonary edema. Currently on 25mg  daily. BP is marginal so not lowering any further for now.    Right arm cellulitis: No evidence of clot on U/S. Exam is not consistent with gout as primary process, though will be starting stress steroids regardless.  - Continue ceftriaxone (no purulent component). He's having slow improvement as would be expected.  - Elevate right arm - No blood cultures drawn at admission, would be low yield now.  Acute on chronic systolic CHF: EF 73-22% stable this admission. Unable to determine diastolic function, though severe biatrial enlargement, mod-severe MR, mild AR, moderate TR with moderately increased PASP. No focal LV ballooning, etc. to definitively determine takotsubo.  - Cardiology confirms pt not candidate for advanced therapies. Holding beta blocker with hypotension, no ACE/ARB due to renal failure.  - Lasix 80mg  IV BID continue today. CXR this AM improved effusions R > L on my independent review. Still showing vascular congestion. No infiltrate. Creatinine improving.  AKI on stage IV CKD: Suspected to be due to intravascular volume depletion. Improved with IVF's. Possibly hemodynamically mediated with ongoing hypotension, as well as due to NSAID nephropathy (taking etodolac multiple times/day for right arm pain), possibly urinary retention as well (resolved) and ?cardiorenal. Renal U/S with cortical thinning suggestive of known CKD without hydronephrosis.  Discussed  that not a HD candidate were renal function to decline. - Creatinine improving,  follow daily. Strict I/O with foley given ongoing aggressive diuresis, acuity of illness.  - Down 3.3L yesterday which brought him into net negative balance for the admission.  Acute delirium: Hallucinations, disorientation waxing/waning worst at night. Counseled pt and family that this is difficult to treat at his age and we're most interested in mitigating complications/injury. - Discussed delirium precautions with pt and family. Family to stay at bedside as much as possible.  - Continue pt's home qHS triazolam. - Avoiding QT prolonging agents as able due to prolonged QT and episode suggestive of R on T seen on telemetry. If comfort measures are desired, would start low dose zyprexa qHS. - Tapering steroids as quickly as reasonable. Though concern for steroid-induced psychosis lower than that for delirium with abrupt return to normal this AM.  Acute urinary retention due to BPH: Prostate enlargement (4.3cm) on U/S is stable.  - Continue foley catheter (inserted 7/26), holding flomax with hypotension.  AFib, has BiV pacer for resynchronization therapy:  - Monitor HR, holding beta blocker with hypotension - Stopped eliquis with significant risk of bleeding and current renal failure. May consider coumadin pending renal function vs. hold anticoagulation.  Grief reaction: Appropriate bereavement with wife of 15 years passing earlier this month, currently lives alone.  - Pt's pastor has been by to see him.  - Will get visit from [place] following his wife's passing next week.  Gout: Chronic.  - Continue febuxostat (continued from outpatient setting) as this is a significant benefit to a significant symptomatic burden to the patient, can discuss with patient the recent findings of CV mortality with this drug.  - On steroids as above, due to chronic prednisone, will need prolonged taper  Hypothyroidism:  - Continue synthroid  GERD:  - PPI  DVT prophylaxis: SCDs Code Status: Full Family  Communication: Daughter and son-in-law at bedside this AM. Disposition Plan: Uncertain: Based on discussions with myself and palliative care, the patient would want to avoid SNF and would therefore best be served by hospice following him at home. Will monitor clinical progression.  Palliative care raises great questions regarding use of BiPAP, venue at disposition, and overall the balance between risk of medical therapies that would improve QOL. While based on previous discussions I believe they WILL want more focus on QOL, I.e. tendency toward comfort measures. Will plan on continuing therapies as above until respiratory/cardiac/renal status is optimized and discuss these issues when the dust has settled. In the meantime, I don't believe they're ready for anything short of treatment in the hospital with understood DNR status.  Consultants:   Cardiology  Palliative care team  Procedures:   None  Antimicrobials:  Zosyn 7/24, 7/26  Ceftriaxone 7/24 >>    Subjective: Had abrupt breathing trouble this morning, paged by RN. No inciting event noted, started after cardiology saw the patient and he was doing ok. Denies wheezing, though breathing treatment did seem to help. He is now returning to better breathing. No chest pain or fever.  Objective: Vitals:   11/12/17 0155 11/12/17 0555 11/12/17 0606 11/12/17 0803  BP: (!) 113/97 (!) 98/56  115/79  Pulse: 62 71  84  Resp: (!) 34 (!) 21  20  Temp:    98.1 F (36.7 C)  TempSrc:    Oral  SpO2: 92% 95%  97%  Weight:   81.6 kg (180 lb)   Height:  Intake/Output Summary (Last 24 hours) at 11/12/2017 1323 Last data filed at 11/12/2017 0900 Gross per 24 hour  Intake 816.54 ml  Output 3300 ml  Net -2483.46 ml   Filed Weights   11/11/17 0500 11/11/17 2032 11/12/17 0606  Weight: 83.5 kg (184 lb) 100.6 kg (221 lb 12.5 oz) 81.6 kg (180 lb)   Gen: Alert elderly male in no distress Pulm: Nonlabored tachypnea with supplemental oxygen, no  wheezing or crackles, decreased at bases. CV: Irreg w/frequent V pacing. No murmur, rub, or gallop. No JVD, +LE dependent edema. GI: Abdomen soft, non-tender, non-distended, with normoactive bowel sounds.  Ext: Warm, no deformities. Right arm swelling improving, though remains on dorsum of right hand with tender erythema.  Skin: No new rashes, lesions or ulcers on visualized skin.  Neuro: Alert and oriented. No focal neurological deficits. Psych: Judgement and insight appear fair, slowed cognition. Mood anxious & affect congruent. Behavior is appropriate.    CBC: Recent Labs  Lab 11/08/17 1326 11/09/17 0002 11/10/17 0451 11/11/17 0313  WBC 14.4* 12.6* 14.5* 12.5*  HGB 11.7* 12.0* 10.5* 11.0*  HCT 37.3* 37.7* 32.6* 34.5*  MCV 101.1* 100.8* 100.0 99.4  PLT 213 212 224 784   Basic Metabolic Panel: Recent Labs  Lab 11/08/17 1326 11/09/17 0002 11/10/17 0451 11/11/17 0313 11/12/17 0653  NA 137 136 139 141 141  K 5.2* 4.5 4.7 4.2 3.2*  CL 104 104 107 110 105  CO2 22 21* 21* 19* 23  GLUCOSE 88 145* 167* 127* 103*  BUN 63* 59* 53* 52* 47*  CREATININE 3.27* 3.26* 2.90* 2.85* 2.46*  CALCIUM 8.5* 7.9* 8.2* 8.5* 8.1*   GFR: Estimated Creatinine Clearance: 18.4 mL/min (A) (by C-G formula based on SCr of 2.46 mg/dL (H)).   Thyroid Function Tests: Recent Labs    11/10/17 0442 11/10/17 0751 11/10/17 0815  TSH 0.169*  --   --   FREET4  --  0.98  --   T3FREE  --   --  1.3*   Urine analysis:    Component Value Date/Time   COLORURINE YELLOW 11/08/2017 [place] 11/08/2017 1646   LABSPEC 1.006 11/08/2017 1646   PHURINE 5.0 11/08/2017 1646   GLUCOSEU NEGATIVE 11/08/2017 1646   HGBUR NEGATIVE 11/08/2017 1646   BILIRUBINUR NEGATIVE 11/08/2017 1646   KETONESUR NEGATIVE 11/08/2017 1646   PROTEINUR NEGATIVE 11/08/2017 1646   UROBILINOGEN 1.0 02/19/2015 1815   NITRITE NEGATIVE 11/08/2017 1646   LEUKOCYTESUR NEGATIVE 11/08/2017 1646   Radiology Studies: Dg Chest  Port 1 View  Result Date: 11/12/2017 CLINICAL DATA:  Shortness of breath EXAM: PORTABLE CHEST 1 VIEW COMPARISON:  11/11/2017 FINDINGS: Cardiac shadow is prominent. Pacing device is again seen. Persistent left basilar atelectasis with mild effusion is noted. Small effusion is noted on the right. No new focal infiltrate is seen. IMPRESSION: Slight improved aeration is noted particularly in the right lung base. No new focal abnormality is noted. Electronically Signed   By: Inez Catalina M.D.   On: 11/12/2017 08:41   Dg Chest Port 1 View  Result Date: 11/11/2017 CLINICAL DATA:  Follow-up pulmonary edema and acute respiratory failure EXAM: PORTABLE CHEST 1 VIEW COMPARISON:  11/10/2017 FINDINGS: Cardiac shadow remains enlarged. Pacing device is again seen. Vascular congestion is noted although the degree of interstitial edema has improved slightly in the interval from the prior exam. Bibasilar atelectasis and effusions are again identified. IMPRESSION: Site improvement in the degree of vascular congestion and edema. Electronically Signed   By: Elta Gu[place]  Lukens M.D.   On: 11/11/2017 09:21   Dg Chest Port 1 View  Result Date: 11/10/2017 CLINICAL DATA:  Acute respiratory failure. EXAM: PORTABLE CHEST 1 VIEW COMPARISON:  Two-view chest x-ray 11/08/2017. FINDINGS: Heart is enlarged. Pacemaker is stable. Diffuse interstitial edema has increased. Bilateral pleural effusions are present. Bibasilar airspace disease is noted. IMPRESSION: 1. Cardiomegaly with new superimposed interstitial edema and bilateral pleural effusions compatible with congestive heart failure. 2. Bibasilar airspace disease likely reflects atelectasis. Electronically Signed   By: San Morelle M.D.   On: 11/10/2017 15:45    LOS: 4 days   Time spent: 35 minutes.  Patrecia Pour, MD [facility] [email or web address] Password [email or web address] 11/12/2017, 1:23 PM

## 2017-11-13 DIAGNOSIS — I482 Chronic atrial fibrillation: Secondary | ICD-10-CM

## 2017-11-13 LAB — CBC
HCT: 34.6 % — ABNORMAL LOW (ref 39.0–52.0)
HEMOGLOBIN: 11.1 g/dL — AB (ref 13.0–17.0)
MCH: 31.7 pg (ref 26.0–34.0)
MCHC: 32.1 g/dL (ref 30.0–36.0)
MCV: 98.9 fL (ref 78.0–100.0)
PLATELETS: 236 10*3/uL (ref 150–400)
RBC: 3.5 MIL/uL — AB (ref 4.22–5.81)
RDW: 14.3 % (ref 11.5–15.5)
WBC: 9.6 10*3/uL (ref 4.0–10.5)

## 2017-11-13 LAB — BASIC METABOLIC PANEL
Anion gap: 10 (ref 5–15)
BUN: 39 mg/dL — AB (ref 8–23)
CO2: 29 mmol/L (ref 22–32)
Calcium: 8.3 mg/dL — ABNORMAL LOW (ref 8.9–10.3)
Chloride: 106 mmol/L (ref 98–111)
Creatinine, Ser: 2.18 mg/dL — ABNORMAL HIGH (ref 0.61–1.24)
GFR calc Af Amer: 27 mL/min — ABNORMAL LOW (ref >60)
GFR calc non Af Amer: 24 mL/min — ABNORMAL LOW (ref >60)
GLUCOSE: 107 mg/dL — AB (ref 70–99)
POTASSIUM: 3.8 mmol/L (ref 3.5–5.1)
Sodium: 145 mmol/L (ref 135–145)

## 2017-11-13 MED ORDER — LIDOCAINE HCL 1 % IJ SOLN
10.0000 mL | Freq: Once | INTRAMUSCULAR | Status: AC
  Administered 2017-11-13: 2 mL via INTRADERMAL
  Filled 2017-11-13: qty 10

## 2017-11-13 MED ORDER — DOXYCYCLINE HYCLATE 100 MG PO TABS
100.0000 mg | ORAL_TABLET | Freq: Two times a day (BID) | ORAL | Status: DC
  Administered 2017-11-13 – 2017-11-15 (×5): 100 mg via ORAL
  Filled 2017-11-13 (×6): qty 1

## 2017-11-13 MED ORDER — OLANZAPINE 2.5 MG PO TABS
2.5000 mg | ORAL_TABLET | Freq: Every day | ORAL | Status: DC
  Administered 2017-11-13 – 2017-11-14 (×2): 2.5 mg via ORAL
  Filled 2017-11-13 (×4): qty 1

## 2017-11-13 MED ORDER — LIDOCAINE HCL 1 % IJ SOLN
5.0000 mL | Freq: Once | INTRAMUSCULAR | Status: DC
  Filled 2017-11-13: qty 5

## 2017-11-13 NOTE — Consult Note (Signed)
Reason for Consult:Right hand abscess Referring Physician: R Zacharias Lewis is an 84 y.o. male.  HPI: Levi Lewis was admitted 5d ago with right hand/FA cellulitis. This has improved on IV ceftriaxone. He continues to have significant right hand pain and it seems that an abscess has coalesced on the dorsum of his hand and hand surgery was consulted for I&D. He c/o pain in the hand, says the FA pain is much improved.  Past Medical History:  Diagnosis Date  . Aortic insufficiency   . Arthritis    "back, knees, right leg" (03/15/2016)  . Atrial fibrillation ([place])   . CAD (coronary artery disease)   . Cardiomyopathy, EF by echo 25-30% 07/14/2013  . CHF (congestive heart failure) ([place])   . Chronic lower back pain   . COPD (chronic obstructive pulmonary disease) ([place])   . Dyslipidemia   . GERD (gastroesophageal reflux disease)   . Gout   . Hypertension   . Hypothyroidism   . Left bundle branch block   . Lung nodules   . Mitral regurgitation   . On home oxygen therapy    "2L; just at night now" (11/09/2017)  . Pacemaker   . Pneumonia    "2-3 times" (03/15/2016)  . RBBB   . Shortness of breath   . Shoulder pain    left; limited ROM  . Skin cancer    "left side of my nose; had another one maybe off my back" (03/15/2016)    Past Surgical History:  Procedure Laterality Date  . BACK SURGERY    . BI-VENTRICULAR PACEMAKER INSERTION (CRT-P)  07-15-2013   STJ Quadra Allura CRTP implanted by Dr Caryl Comes for NICM, CHF, alternating bundle branch blocks  . CARDIOVERSION N/A 09/13/2013   Procedure: CARDIOVERSION;  Surgeon: Carlena Bjornstad, MD;  Location: [facility] ENDOSCOPY;  Service: Cardiovascular;  Laterality: N/A;  . CARDIOVERSION N/A 10/17/2013   Procedure: CARDIOVERSION;  Surgeon: Lelon Perla, MD;  Location: [facility];  Service: Cardiovascular;  Laterality: N/A;  . CARDIOVERSION N/A 07/04/2016   Procedure: CARDIOVERSION;  Surgeon: Sanda Klein, MD;  Location: [facility] ENDOSCOPY;  Service:  Cardiovascular;  Laterality: N/A;  . CATARACT EXTRACTION W/ INTRAOCULAR LENS  IMPLANT, BILATERAL Bilateral   . CORONARY ANGIOPLASTY    . INSERT / REPLACE / REMOVE PACEMAKER    . KNEE ARTHROSCOPY Right   . LUMBAR DISC SURGERY    . PERMANENT PACEMAKER INSERTION N/A 07/15/2013   Procedure: PERMANENT PACEMAKER INSERTION;  Surgeon: Deboraha Sprang, MD;  Location: [facility] CATH LAB;  Service: Cardiovascular;  Laterality: N/A;  . SHOULDER ARTHROSCOPY Right   . THROAT SURGERY  ~ 1950   "exploratory; felt like I had something in my throat & they went in to look around"  . TONSILLECTOMY      Family History  Problem Relation Age of Onset  . Heart attack Sister   . Lung cancer Brother        smoker  . Colon cancer Neg Hx     Social History:  reports that he has never smoked. He has never used smokeless tobacco. He reports that he does not drink alcohol or use drugs.  Allergies: No Known Allergies  Medications: I have reviewed the patient's current medications.  Results for orders placed or performed during the hospital encounter of 11/08/17 (from the past 48 hour(s))  Basic metabolic panel     Status: Abnormal   Collection Time: 11/12/17  6:53 AM  Result Value Ref Range   Sodium  141 135 - 145 mmol/L   Potassium 3.2 (L) 3.5 - 5.1 mmol/L   Chloride 105 98 - 111 mmol/L   CO2 23 22 - 32 mmol/L   Glucose, Bld 103 (H) 70 - 99 mg/dL   BUN 47 (H) 8 - 23 mg/dL   Creatinine, Ser 2.46 (H) 0.61 - 1.24 mg/dL   Calcium 8.1 (L) 8.9 - 10.3 mg/dL   GFR calc non Af Amer 20 (L) >60 mL/min   GFR calc Af Amer 24 (L) >60 mL/min    Comment: (NOTE) The eGFR has been calculated using the CKD EPI equation. This calculation has not been validated in all clinical situations. eGFR's persistently <60 mL/min signify possible Chronic Kidney Disease.    Anion gap 13 5 - 15    Comment: Performed at [facility] [address] [place], [place] [address]  CBC     Status: Abnormal   Collection Time: 11/13/17  5:37  AM  Result Value Ref Range   WBC 9.6 4.0 - 10.5 K/uL   RBC 3.50 (L) 4.22 - 5.81 MIL/uL   Hemoglobin 11.1 (L) 13.0 - 17.0 g/dL   HCT 34.6 (L) 39.0 - 52.0 %   MCV 98.9 78.0 - 100.0 fL   MCH 31.7 26.0 - 34.0 pg   MCHC 32.1 30.0 - 36.0 g/dL   RDW 14.3 11.5 - 15.5 %   Platelets 236 150 - 400 K/uL    Comment: Performed at [facility], [place] [address] [place], [place] [address]  Basic metabolic panel     Status: Abnormal   Collection Time: 11/13/17  5:37 AM  Result Value Ref Range   Sodium 145 135 - 145 mmol/L   Potassium 3.8 3.5 - 5.1 mmol/L   Chloride 106 98 - 111 mmol/L   CO2 29 22 - 32 mmol/L   Glucose, Bld 107 (H) 70 - 99 mg/dL   BUN 39 (H) 8 - 23 mg/dL   Creatinine, Ser 2.18 (H) 0.61 - 1.24 mg/dL   Calcium 8.3 (L) 8.9 - 10.3 mg/dL   GFR calc non Af Amer 24 (L) >60 mL/min   GFR calc Af Amer 27 (L) >60 mL/min    Comment: (NOTE) The eGFR has been calculated using the CKD EPI equation. This calculation has not been validated in all clinical situations. eGFR's persistently <60 mL/min signify possible Chronic Kidney Disease.    Anion gap 10 5 - 15    Comment: Performed at [facility] [address], [place], [place] [address]    Dg Chest Port 1 View  Result Date: 11/12/2017 CLINICAL DATA:  Shortness of breath EXAM: PORTABLE CHEST 1 VIEW COMPARISON:  11/11/2017 FINDINGS: Cardiac shadow is prominent. Pacing device is again seen. Persistent left basilar atelectasis with mild effusion is noted. Small effusion is noted on the right. No new focal infiltrate is seen. IMPRESSION: Slight improved aeration is noted particularly in the right lung base. No new focal abnormality is noted. Electronically Signed   By: Inez Catalina M.D.   On: 11/12/2017 08:41    Review of Systems  Constitutional: Negative for weight loss.  HENT: Negative for ear discharge, ear pain, hearing loss and tinnitus.   Eyes: Negative for blurred vision, double vision, photophobia and pain.  Respiratory:  Negative for cough, sputum production and shortness of breath.   Cardiovascular: Negative for chest pain.  Gastrointestinal: Negative for abdominal pain, nausea and vomiting.  Genitourinary: Negative for dysuria, flank pain, frequency and  urgency.  Musculoskeletal: Positive for joint pain (Right hand). Negative for back pain, falls, myalgias and neck pain.  Neurological: Negative for dizziness, tingling, sensory change, focal weakness, loss of consciousness and headaches.  Endo/Heme/Allergies: Does not bruise/bleed easily.  Psychiatric/Behavioral: Negative for depression, memory loss and substance abuse. The patient is not nervous/anxious.    Blood pressure 97/60, pulse 84, temperature 98.6 F (37 C), resp. rate (!) 22, height 6' (1.829 m), weight 83 kg (183 lb), SpO2 97 %. Physical Exam  Constitutional: He appears well-developed and well-nourished. No distress.  HENT:  Head: Normocephalic and atraumatic.  Eyes: Conjunctivae are normal. Right eye exhibits no discharge. Left eye exhibits no discharge. No scleral icterus.  Neck: Normal range of motion.  Cardiovascular: Normal rate and regular rhythm.  Respiratory: Effort normal. No respiratory distress.  Musculoskeletal:  Right shoulder, elbow, wrist, digits- no skin wounds, mod TTP dorsum of hand, fluctuant, thin fluid collection noted over 2nd MC, no significant erythema, 1+ edema noted to rest of hand, no instability, no blocks to motion  Sens  Ax/Levi/M/U intact  Mot   Ax/ Levi/ PIN/ M/ AIN/ U grossly intact, limited significantly by pain  Rad 2+  Neurological: He is alert.  Skin: Skin is warm and dry. He is not diaphoretic.  Psychiatric: He has a normal mood and affect. His behavior is normal.    Assessment/Plan: Right hand abscess -- Will I&D, send fluid for GS, C&S. He should f/u with Dr. Doreatha Martin as OP in 1-2 weeks.    Lisette Abu, PA-C Orthopedic Surgery [phone] 11/13/2017, 1:19 PM

## 2017-11-13 NOTE — Progress Notes (Signed)
  Speech Language Pathology Treatment: Dysphagia  Patient Details Name: Levi Lewis MRN: [id number] DOB: [birth year] Today's Date: 11/13/2017 Time: 5670-1410 SLP Time Calculation (min) (ACUTE ONLY): 15 min  Assessment / Plan / Recommendation Clinical Impression  Pt seen at bedside to follow up after completion of BSE Saturday 11/11/17. Pt was observed being fed lunch. Family reports pt does better with soft solids due to poor dentition, and that he gets winded easily. Pt/family were encouraged to allow rest breaks. No overt s/s aspiration observed or reported. CXR indicates improvement in RLL. ST will sign off at this time. Please reconsult if needs arise.   HPI HPI: Pt is a 84 y.o. male with a history of PNA, lung nodules, on home O2, GERD, COPD, chronic HFrEF, gout/arthritis on chronic prednisone, persistent AFib s/p BiV PPM, and stage III CKD who presented to the ED about 3 weeks following the death of his wife of 109 years with right arm pain, swelling and low blood pressure associated with generalized weakness. He was admitted for dehydration causing hypotension, AKI. UA and CXR without acute infection. Pt had an episode of acute respiratory distress 7/27 characterized by MD note as wheezing that started abruptly during lunch, rattling sounds in his throat, difficulty catching his breath. Pt/family denied any choking or trouble swallowing. Initial differential dx included possible aspiration event, but CXR more consistent with pulmonary edema. Swallow evaluation ordered.      SLP Plan  Discharge SLP treatment due to goals met.       Recommendations  Diet recommendations: Regular;Dysphagia 3 (mechanical soft);Thin liquid Liquids provided via: Cup Supervision: Full supervision/cueing for compensatory strategies;Staff to assist with self feeding Compensations: Slow rate;Small sips/bites;Follow solids with liquid;Other (Comment)(take frequent rest breaks) Postural Changes and/or Swallow  Maneuvers: Seated upright 90 degrees;Upright 30-60 min after meal                Oral Care Recommendations: Oral care BID Follow up Recommendations: None SLP Visit Diagnosis: Dysphagia, unspecified (R13.10) Plan: All goals met;Discharge SLP treatment due to (comment)       GO              Taziyah Iannuzzi B. Quentin Ore [facility], CCC-SLP Speech Language Pathologist [phone]  Shonna Chock 11/13/2017, 2:09 PM

## 2017-11-13 NOTE — Care Management Important Message (Signed)
Important Message  Patient Details  Name: Levi Lewis MRN: [id number] Date of Birth: 1918-04-11   Medicare Important Message Given:  No  Patient family refused addl. Copy   Bethzy Hauck 11/13/2017, 3:09 PM

## 2017-11-13 NOTE — Progress Notes (Signed)
Daily Progress Note   Patient Name: Levi Lewis       Date: 11/13/2017 DOB: 15-May-1917  Age: 84 y.o. MRN#: [id number] Attending Physician: Patrecia Pour, MD Primary Care Physician: [name], MD Admit Date: 11/08/2017  Reason for Consultation/Follow-up: Establishing goals of care and Psychosocial/spiritual support  Subjective: Visited with patient and his son at bedside.  He is complaining that his hand hurts after recent I&D.  I let him know pain medication is available if he asks for it.  We discussed going to SNF for rehab vs going home with hospice.  Patient states he would rather be at home and be comfortable, but does not want to be a burden on his family.  He knows he will need 24 assistance.    His son is local, but his daughter lives and hour+ away.    We discussed calling back some of the same care givers the family used for his wife recently when she was on hospice.   He states he knows he needs to make a decision, but wants to see how well he can do with PT tomorrow.  Can he get to bedside commode on his own, etc....   Assessment: 89 yom alert and coherent.  CKD stg4, CHF EF 20-25%, COPD on 6L, permanent afib off anticoagulation, very limited mobility. Still suffering with some episodes of delirium at night.   Patient Profile/HPI:  84 y.o. male  with past medical history of COPD on home oxygen, CHF EF 20 - 25%, permanent atrial fibrillation, CKD stg 4, and aortic insufficiency who was admitted on 11/08/2017 with hypotension, dizziness, acute on chronic kidney injury and right arm cellulitis.  Mr. Brumbaugh has had difficulty with acute delirium during his hospital stay.  He has also required intermittent BiPAP.  PMT has been consulted for goals of care.    Length of Stay:  5  Current Medications: Scheduled Meds:  . doxycycline  100 mg Oral Q12H  . febuxostat  40 mg Oral Daily  . fenofibrate  54 mg Oral Daily  . furosemide  80 mg Intravenous Q12H  . gabapentin  300 mg Oral BID  . hydrocortisone sodium succinate  25 mg Intravenous Daily  . levalbuterol  1.25 mg Nebulization BID  . levothyroxine  75 mcg Oral QAC breakfast  . mouth  rinse  15 mL Mouth Rinse BID  . OLANZapine  2.5 mg Oral QHS  . omega-3 acid ethyl esters  2 g Oral Daily  . pantoprazole  40 mg Oral Daily  . vitamin B-12  100 mcg Oral Daily  . vitamin E  100 Units Oral Daily    Continuous Infusions: . sodium chloride Stopped (11/09/17 2316)    PRN Meds: acetaminophen **OR** acetaminophen, levalbuterol, senna-docusate, simethicone, triazolam  Physical Exam       Well developed elderly gentleman, A&O, coherent, cooperative NAD Right hand bandaged, clean and dry.  Vital Signs: BP 113/69   Pulse 87   Temp 98.6 F (37 C)   Resp (!) 26   Ht 6' (1.829 m)   Wt 83 kg (183 lb)   SpO2 95%   BMI 24.82 kg/m  SpO2: SpO2: 95 % O2 Device: O2 Device: Nasal Cannula O2 Flow Rate: O2 Flow Rate (L/min): 6 L/min  Intake/output summary:   Intake/Output Summary (Last 24 hours) at 11/13/2017 1659 Last data filed at 11/13/2017 1313 Gross per 24 hour  Intake 650 ml  Output 3050 ml  Net -2400 ml   LBM: Last BM Date: 2017/11/18 Baseline Weight: Weight: 81.6 kg (180 lb) Most recent weight: Weight: 83 kg (183 lb)       Palliative Assessment/Data: 30%      Patient Active Problem List   Diagnosis Date Noted  . AKI (acute kidney injury) ([place])   . Palliative care encounter   . Acute on chronic renal failure ([place]) 11/08/2017  . Arm swelling right 11/08/2017  . Acute on chronic systolic HF (heart failure) ([place]) 03/15/2016  . Acute on chronic systolic CHF (congestive heart failure) ([place])   . Acute hypoxemic respiratory failure ([place]) 10/18/2015  . Fall 10/18/2015  . Suprapubic pain 10/23/2013   . HCAP (healthcare-associated pneumonia) 10/20/2013  . Sepsis ([place]) 10/19/2013  . Hypotension 10/19/2013  . Fatigue 10/15/2013  . Multiple blisters 09/03/2013  . Hypothyroidism 08/05/2013  . Pacemaker 08/04/2013  . Chronic systolic CHF (congestive heart failure) ([place]) 08/04/2013  . HX: anticoagulation 08/04/2013  . CKD (chronic kidney disease) stage 4, GFR 15-29 ml/min ([facility]) 07/14/2013  . Cardiomyopathy, EF by echo 25-30% 07/14/2013  . Atrial fibrillation ([place]) 07/13/2013  . Alternating (bilateral) bundle branch block 07/13/2013  . Back pain   . Shoulder pain   . Left bundle branch block   . CAD (coronary artery disease)   . Hypertension   . Gout   . Dyslipidemia   . Ejection fraction   . Lung nodules   . COPD (chronic obstructive pulmonary disease) ([place])   . Mitral regurgitation   . Aortic insufficiency     Palliative Care Plan    Recommendations/Plan:  Agree with current plan of care.   Consider discontinuing tele monitor at bedside (keep it at the RN station)  Pt is eligible for Hospice Services in his home.  We discussed the need for private duty assistance as well.    Code Status:  DNR  Prognosis:   < 6 months at high risk for an acute event that would make his prognosis much shorter.   CKD stg4, CHF EF 20-25%, COPD on 6L, permanent afib off anticoagulation, very limited mobility. Still suffering with some episodes of delirium at night.  His wife of 32 years passed away in early Nov 19, 2022.  Discharge Planning:  To Be Determined  Care plan was discussed with patient and son  Thank you for allowing the Palliative Medicine Team  to assist in the care of this patient.  Total time spent:  25 min.     Greater than 50%  of this time was spent counseling and coordinating care related to the above assessment and plan.  Florentina Jenny, PA-C Palliative Medicine  Please contact Palliative MedicineTeam phone at [phone] for questions and concerns between 7  am - 7 pm.   Please see AMION for individual provider pager numbers.

## 2017-11-13 NOTE — Procedures (Signed)
Procedure: Right hand I&D  Indication: Right hand abscess  Surgeon: Silvestre Gunner, PA-C  Assist: None  Anesthesia: 55ml 1% plain lidocaine  EBL: None  Complications: None  Findings: After risks/benefits explained patient desires to undergo procedure. Consent obtained and time out performed. The right hand was sterilely prepped lidocaine administered. A 1.5cm incision was made and ~82ml purulent material expressed from wound. Wound was then explored with hemostat (as much as pt could tolerate). Wound was then packed with 1/4" packing and dressed. Pt tolerated the procedure well.    Lisette Abu, PA-C Orthopedic Surgery [phone]

## 2017-11-13 NOTE — Progress Notes (Signed)
PROGRESS NOTE  Levi Lewis  ZOX:[id number] DOB: Aug 28, 1917 DOA: 11/08/2017 PCP: [name], MD  Outpatient Specialists: Cardiology, [facility] Brief Narrative: Levi Lewis is a 84 y.o. male with a history of chronic HFrEF, gout/arthritis on chronic prednisone, persistent AFib s/p BiV PPM and stage III CKD who presented to the ED about 3 weeks following the death of his wife of 16 years with right arm pain, swelling and low blood pressure associated with generalized weakness. WBC 14k, Cr up to 3.2 from baseline of 2, UA and CXR without acute infection. He was admitted for dehydration causing hypotension with some improvement after IV fluids and given ceftriaxone for right arm cellulitis. U/S ruled out DVT/SVT. He also required I/O cath for urinary retention which is resolved. AKI was also noted, improved with IVF's. Renal U/S showed cortical atrophy and no hydronephrosis. Cardiology was consulted and confirmed that the patient is not a candidate for advanced HF therapies, echocardiogram showing severely hypokinetic LV with EF 20-25% as well as biatral enlargement and valvular disease. He developed pulmonary edema briefly requiring BiPAP without further escalation due to DNR status. He fortunately responded to IV lasix and has also noted improved renal function. Palliative care has been consulted and met with patient and family. Goals of care are to be decided based on patient progress.   Assessment & Plan: Principal Problem:   Hypotension Active Problems:   CAD (coronary artery disease)   Atrial fibrillation (HCC)   Pacemaker   Chronic systolic CHF (congestive heart failure) (HCC)   Acute on chronic renal failure (HCC)   Arm swelling right   AKI (acute kidney injury) ([place])   Palliative care encounter  Acute on chronic hypoxic respiratory failure, COPD, pulmonary edema: Developed severe respiratory distress 7/26 with flash pulmonary edema, slowly improving, still needing negative balance. DC  zosyn (initial concern for aspiration but no fever, cough, or choking, mild aspiration risk per SLP evaluation) - Continue SDU level of care for next 24 hours. If no prn BiPAP would take back to telemetry.  - Continue breathing treatments prn. On steroids as well.  - Check CXR in AM.   Hypotension: Possibly related to cellulitis with relative adrenal insufficiency vs. progressive heart failure/broken heart syndrome. No evidence of bleeding. Note low-normal BPs as outpatient. Leukocytosis not likely to be reliable in setting of steroids. TSH is suppressed but T4 is normal.  - Cardiology consulted in light of cardiac history, appreciate their recommendations.  - Stress steroids provided and tapered in light of worsening pulmonary edema. Currently on 9m daily. BP is marginal so not lowering any further for now. I suspect we'll have to tolerate lower than typical MAPs in this patient.   Right arm cellulitis and abscess.: No evidence of clot on U/S. Exam is not consistent with gout as primary process, though will be starting stress steroids regardless.  - Continue ceftriaxone, add doxycycline now that some purulent component noted. Avoiding nephrotoxic alternatives including vancomycin.  - Suspect I&D would be helpful for therapeutic reasons to reduce pain and abbreviate Tx duration. If culture can be taken, may also be helpful to narrow abx. Due to location on dorsum of hand, I've asked surgery's opinion who recommended orthopedics evaluation. Spoke with MSilvestre Gunner PA who will see the patient. Appreciate their help. - Elevate right arm - No blood cultures drawn at admission, would be low yield now.  Acute on chronic systolic CHF: EF 281-19%stable this admission. Unable to determine diastolic function, though severe biatrial enlargement,  mod-severe MR, mild AR, moderate TR with moderately increased PASP. No focal LV ballooning, etc. to definitively determine takotsubo.  - Cardiology confirms pt  not candidate for advanced therapies. Holding beta blocker with hypotension, no ACE/ARB due to renal failure.  - Continue IV lasix and therefore also foley. Monitoring creatinine, UOP closely. Has significant reported UOP with inconsistent charting.   AKI on stage IV CKD: Suspected to be due to intravascular volume depletion. Improved with IVF's. Possibly hemodynamically mediated with ongoing hypotension, as well as due to NSAID nephropathy (taking etodolac multiple times/day for right arm pain), possibly urinary retention as well (resolved) and ?cardiorenal. Renal U/S with cortical thinning suggestive of known CKD without hydronephrosis.  Discussed that not a HD candidate were renal function to decline. - Creatinine improving, follow daily. Strict I/O with foley given ongoing aggressive diuresis, acuity of illness.  - Continues negative balance with the exception of yesterday where day shift UOP not documented.   Acute delirium: Hallucinations, disorientation waxing/waning worst at night. Counseled pt and family that this is difficult to treat at his age and we're most interested in mitigating complications/injury. - Discussed delirium precautions with pt and family. Family to stay at bedside as much as possible.  - Continue pt's home qHS triazolam. - Discussed risk/benefit of zyprexa with family including worsening heart rhythm. With severity of delirium and its risks, will start zyprexa tonight at low dose. - Tapering steroids as quickly as reasonable. Though concern for steroid-induced psychosis lower than that for delirium with abrupt return to normal this AM.  Acute urinary retention due to BPH: Prostate enlargement (4.3cm) on U/S is stable.  - Continue foley catheter (inserted 7/26), holding flomax with hypotension.  AFib, has BiV pacer for resynchronization therapy:  - Monitor HR, holding beta blocker with hypotension - Stopped eliquis with significant risk of bleeding and current renal  failure. I agree with cardiology that stopping anticoagulation altogether is very reasonable. That is our current plan.   Grief reaction: Appropriate bereavement with wife of 76 years passing earlier this month, currently lives alone.  - Pt's pastor has been by to see him.  - Still followed by [facility].   Gout: Chronic.  - Continue febuxostat (continued from outpatient setting) as this is a significant benefit to a significant symptomatic burden to the patient, can discuss with patient the recent findings of CV mortality with this drug.  - On steroids as above, due to chronic prednisone, will need prolonged taper  Hypothyroidism:  - Continue synthroid  GERD:  - PPI  DVT prophylaxis: SCDs Code Status: Full Family Communication: Daughter and son at bedside this AM. Disposition Plan: Uncertain: Based on discussions with myself and palliative care, the patient would want to avoid SNF and would therefore best be served by hospice following him at home. Will monitor clinical progression.  Palliative care raises great questions regarding use of BiPAP, venue at disposition, and overall the balance between risk of medical therapies that would improve QOL. Current clinical improvement in respiratory status has permitted ongoing discussions today. We will start zyprexa for delirium which is a shift nearer comfort measures. Still suspect discharge home with hospice will be most in line with his values. Will continue discussions tomorrow.  Consultants:   Cardiology  Palliative care team  Orthopedics  Procedures:   None  Antimicrobials:  Zosyn 7/24, 7/26  Ceftriaxone 7/24 >>    Subjective: Breathing much better today. Right hand swelling worse, hurting more.   Objective: Vitals:   11/13/17  0217 11/13/17 0404 11/13/17 0526 11/13/17 0742  BP: (!) 89/52  97/60   Pulse: 69  84   Resp: 17  (!) 22   Temp:  98.6 F (37 C)    TempSrc:      SpO2: 97%  92% 97%  Weight:      Height:         Intake/Output Summary (Last 24 hours) at 11/13/2017 1207 Last data filed at 11/13/2017 1117 Gross per 24 hour  Intake 300 ml  Output 1700 ml  Net -1400 ml   Filed Weights   11/11/17 2032 11/12/17 0606 11/12/17 2300  Weight: 100.6 kg (221 lb 12.5 oz) 81.6 kg (180 lb) 83 kg (183 lb)   Gen: Alert, pleasant, elderly male in no distress Pulm: Nonlabored breathing supplemental oxygen. Bases clearer today without crackles or wheezing. No upper airway noises heard.  CV: Irreg, rate in 80's. No murmur. No JVD. trace LE pitting edema.  GI: Abdomen soft, non-tender, non-distended, with normoactive bowel sounds.  Ext: Flaccid fluctuance on dorsum of right hand with extending erythema and swelling.  Skin: Diffusely very thin with ecchymoses and some skin tears. No new rashes/lesions. Neuro: Alert and oriented. No focal neurological deficits. Psych: Judgement and insight appear fair. Mood wnl & affect congruent. Behavior is appropriate.    CBC: Recent Labs  Lab 11/08/17 1326 11/09/17 0002 11/10/17 0451 11/11/17 0313 11/13/17 0537  WBC 14.4* 12.6* 14.5* 12.5* 9.6  HGB 11.7* 12.0* 10.5* 11.0* 11.1*  HCT 37.3* 37.7* 32.6* 34.5* 34.6*  MCV 101.1* 100.8* 100.0 99.4 98.9  PLT 213 212 224 248 616   Basic Metabolic Panel: Recent Labs  Lab 11/09/17 0002 11/10/17 0451 11/11/17 0313 11/12/17 0653 11/13/17 0537  NA 136 139 141 141 145  K 4.5 4.7 4.2 3.2* 3.8  CL 104 107 110 105 106  CO2 21* 21* 19* 23 29  GLUCOSE 145* 167* 127* 103* 107*  BUN 59* 53* 52* 47* 39*  CREATININE 3.26* 2.90* 2.85* 2.46* 2.18*  CALCIUM 7.9* 8.2* 8.5* 8.1* 8.3*   GFR: Estimated Creatinine Clearance: 20.8 mL/min (A) (by C-G formula based on SCr of 2.18 mg/dL (H)).   Thyroid Function Tests: No results for input(s): TSH, T4TOTAL, FREET4, T3FREE, THYROIDAB in the last 72 hours. Urine analysis:    Component Value Date/Time   COLORURINE YELLOW 11/08/2017 [place] 11/08/2017 1646   LABSPEC  1.006 11/08/2017 1646   PHURINE 5.0 11/08/2017 1646   GLUCOSEU NEGATIVE 11/08/2017 1646   HGBUR NEGATIVE 11/08/2017 1646   BILIRUBINUR NEGATIVE 11/08/2017 1646   KETONESUR NEGATIVE 11/08/2017 1646   PROTEINUR NEGATIVE 11/08/2017 1646   UROBILINOGEN 1.0 02/19/2015 1815   NITRITE NEGATIVE 11/08/2017 1646   LEUKOCYTESUR NEGATIVE 11/08/2017 1646   Radiology Studies: Dg Chest Port 1 View  Result Date: 11/12/2017 CLINICAL DATA:  Shortness of breath EXAM: PORTABLE CHEST 1 VIEW COMPARISON:  11/11/2017 FINDINGS: Cardiac shadow is prominent. Pacing device is again seen. Persistent left basilar atelectasis with mild effusion is noted. Small effusion is noted on the right. No new focal infiltrate is seen. IMPRESSION: Slight improved aeration is noted particularly in the right lung base. No new focal abnormality is noted. Electronically Signed   By: Inez Catalina M.D.   On: 11/12/2017 08:41    LOS: 5 days   Time spent: 35 minutes.  Patrecia Pour, MD [facility] [email or web address] Password [facility] 11/13/2017, 12:07 PM

## 2017-11-13 NOTE — Progress Notes (Signed)
Progress Note  Patient Name: Levi Lewis Date of Encounter: 11/13/2017  Primary Cardiologist:   Candee Furbish, MD   Subjective   No SOB but very confused last night per the family.  They are thinking about discharge planning.  For now he has continued severe pain in his right hand.  Inpatient Medications    Scheduled Meds: . febuxostat  40 mg Oral Daily  . fenofibrate  54 mg Oral Daily  . furosemide  80 mg Intravenous Q12H  . gabapentin  300 mg Oral BID  . hydrocortisone sodium succinate  25 mg Intravenous Daily  . levalbuterol  1.25 mg Nebulization BID  . levothyroxine  75 mcg Oral QAC breakfast  . mouth rinse  15 mL Mouth Rinse BID  . omega-3 acid ethyl esters  2 g Oral Daily  . pantoprazole  40 mg Oral Daily  . vitamin B-12  100 mcg Oral Daily  . vitamin E  100 Units Oral Daily   Continuous Infusions: . sodium chloride Stopped (11/09/17 2316)   PRN Meds: acetaminophen **OR** acetaminophen, levalbuterol, senna-docusate, simethicone, triazolam   Vital Signs    Vitals:   11/13/17 0217 11/13/17 0404 11/13/17 0526 11/13/17 0742  BP: (!) 89/52  97/60   Pulse: 69  84   Resp: 17  (!) 22   Temp:  98.6 F (37 C)    TempSrc:      SpO2: 97%  92% 97%  Weight:      Height:        Intake/Output Summary (Last 24 hours) at 11/13/2017 1115 Last data filed at 11/13/2017 0551 Gross per 24 hour  Intake 300 ml  Output 350 ml  Net -50 ml   Filed Weights   11/11/17 2032 11/12/17 0606 11/12/17 2300  Weight: 221 lb 12.5 oz (100.6 kg) 180 lb (81.6 kg) 183 lb (83 kg)    Telemetry    Atrial fib with pacing - Personally Reviewed  ECG    N - Personally Reviewed  Physical Exam   GEN: No acute distress.  Frail Neck: No  JVD Cardiac: Irregular RR, apical systolic murmurs, rubs, or gallops.  Respiratory: Mildly decreased breath sounds. GI: Soft, nontender, non-distended  MS:   Right had edema; No deformity. Neuro:  Nonfocal  Psych: Normal affect   Labs     Chemistry Recent Labs  Lab 11/11/17 0313 11/12/17 0653 11/13/17 0537  NA 141 141 145  K 4.2 3.2* 3.8  CL 110 105 106  CO2 19* 23 29  GLUCOSE 127* 103* 107*  BUN 52* 47* 39*  CREATININE 2.85* 2.46* 2.18*  CALCIUM 8.5* 8.1* 8.3*  GFRNONAA 17* 20* 24*  GFRAA 20* 24* 27*  ANIONGAP 12 13 10      Hematology Recent Labs  Lab 11/10/17 0451 11/11/17 0313 11/13/17 0537  WBC 14.5* 12.5* 9.6  RBC 3.26* 3.47* 3.50*  HGB 10.5* 11.0* 11.1*  HCT 32.6* 34.5* 34.6*  MCV 100.0 99.4 98.9  MCH 32.2 31.7 31.7  MCHC 32.2 31.9 32.1  RDW 14.7 14.7 14.3  PLT 224 248 236    Cardiac EnzymesNo results for input(s): TROPONINI in the last 168 hours.  Recent Labs  Lab 11/08/17 1333  TROPIPOC 0.03     BNPNo results for input(s): BNP, PROBNP in the last 168 hours.   DDimer No results for input(s): DDIMER in the last 168 hours.   Radiology    Dg Chest Port 1 View  Result Date: 11/12/2017 CLINICAL DATA:  Shortness of breath  EXAM: PORTABLE CHEST 1 VIEW COMPARISON:  11/11/2017 FINDINGS: Cardiac shadow is prominent. Pacing device is again seen. Persistent left basilar atelectasis with mild effusion is noted. Small effusion is noted on the right. No new focal infiltrate is seen. IMPRESSION: Slight improved aeration is noted particularly in the right lung base. No new focal abnormality is noted. Electronically Signed   By: Inez Catalina M.D.   On: 11/12/2017 08:41    Cardiac Studies   NA   Patient Profile     84 y.o. male  with a hx of dilated cardiomyopathy, chronic systolic HF with EF of 88-28%, s/p Bi-V pacemaker followed by Dr. Caryl Comes, persistent atrial fibrillation w/ failed cardioversion 06/2016, chronic anticoagulation therapy with Eliquis, CKD and h/o CAD, who is being seen for the evaluation of persistent hypotension, at the request of Dr. Bonner Puna, Internal Medicine.   Assessment & Plan    HYPOTENSION:  BP remains low.  Holding Coreg.  No change in therapy.   ATRIAL FIB:   Eliquis on  hold.  Need goals of care discussion.  Would he want to be on warfarin which would require trips to the office for INR vs discontinuing anticoagulation.  I discussed with the family.  My suggestion is no warfarin.   CM:   Continue medical management.  No advanced therapies.     AKI:  Creat continues to improve.    ACUTE ON CHRONIC SYSTOLIC AND DIASTOLIC HF:  For today I think that he is tolerating the IV Lasix and I will continue this.    For questions or updates, please contact [place] Please consult [email or web address] for contact info under Cardiology/STEMI.   Signed, Minus Breeding, MD  11/13/2017, 11:15 AM

## 2017-11-14 ENCOUNTER — Inpatient Hospital Stay (HOSPITAL_COMMUNITY): Payer: Medicare Other

## 2017-11-14 LAB — BASIC METABOLIC PANEL
Anion gap: 10 (ref 5–15)
BUN: 39 mg/dL — ABNORMAL HIGH (ref 8–23)
CALCIUM: 8.1 mg/dL — AB (ref 8.9–10.3)
CHLORIDE: 102 mmol/L (ref 98–111)
CO2: 26 mmol/L (ref 22–32)
Creatinine, Ser: 2 mg/dL — ABNORMAL HIGH (ref 0.61–1.24)
GFR, EST AFRICAN AMERICAN: 30 mL/min — AB (ref >60)
GFR, EST NON AFRICAN AMERICAN: 26 mL/min — AB (ref >60)
Glucose, Bld: 96 mg/dL (ref 70–99)
Potassium: 3.4 mmol/L — ABNORMAL LOW (ref 3.5–5.1)
SODIUM: 138 mmol/L (ref 135–145)

## 2017-11-14 MED ORDER — PREDNISONE 10 MG PO TABS
10.0000 mg | ORAL_TABLET | Freq: Every day | ORAL | Status: DC
  Administered 2017-11-15: 10 mg via ORAL
  Filled 2017-11-14: qty 1

## 2017-11-14 NOTE — Progress Notes (Signed)
Physical Therapy Treatment Patient Details Name: Levi Lewis MRN: [id number] DOB: 23-Apr-1917 Today's Date: 11/14/2017    History of Present Illness Pt is a 84 y/o male admitted secondary to feeling weak and dizzy with significant hypotension. Pt found to also have acute on chronic respiratory failure. PMH including but not limited to CHF, COPD, CAD, HTN and pacemaker placement in 2015.    PT Comments    Patient requires max/total A +2 for bed mobility and sitting balance. Pt communicated very little and kept eyes closed most of session. Pt's daughter and son present. PT will continue to follow acutely per POC.    Follow Up Recommendations  SNF     Equipment Recommendations  None recommended by PT    Recommendations for Other Services       Precautions / Restrictions Precautions Precautions: Fall Precaution Comments: monitor BP, SPO2, HR Restrictions Weight Bearing Restrictions: No    Mobility  Bed Mobility Overal bed mobility: Needs Assistance Bed Mobility: Supine to Sit;Sit to Supine;Rolling Rolling: Max assist   Supine to sit: Total assist;+2 for physical assistance Sit to supine: Total assist;+2 for physical assistance   General bed mobility comments: pt requires assistance for all aspects of bed mobility  Transfers                 General transfer comment: deferred for safety due to lethargy   Ambulation/Gait                 Stairs             Wheelchair Mobility    Modified Rankin (Stroke Patients Only)       Balance Overall balance assessment: Needs assistance Sitting-balance support: Feet supported Sitting balance-Leahy Scale: Zero   Postural control: Posterior lean                                  Cognition Arousal/Alertness: Lethargic Behavior During Therapy: Flat affect Overall Cognitive Status: Impaired/Different from baseline                                 General Comments: pt kept  eyes closed majority of session and communicated very little       Exercises      General Comments        Pertinent Vitals/Pain Pain Assessment: Faces Faces Pain Scale: No hurt Pain Intervention(s): Monitored during session    Home Living                      Prior Function            PT Goals (current goals can now be found in the care plan section) Acute Rehab PT Goals Patient Stated Goal: none stated PT Goal Formulation: With patient/family Time For Goal Achievement: 11/25/17 Potential to Achieve Goals: Fair Progress towards PT goals: Not progressing toward goals - comment    Frequency    Min 2X/week      PT Plan Current plan remains appropriate    Co-evaluation              AM-PAC PT "6 Clicks" Daily Activity  Outcome Measure  Difficulty turning over in bed (including adjusting bedclothes, sheets and blankets)?: Unable Difficulty moving from lying on back to sitting on the side of the bed? : Unable Difficulty sitting down on  and standing up from a chair with arms (e.g., wheelchair, bedside commode, etc,.)?: Unable Help needed moving to and from a bed to chair (including a wheelchair)?: Total Help needed walking in hospital room?: Total Help needed climbing 3-5 steps with a railing? : Total 6 Click Score: 6    End of Session Equipment Utilized During Treatment: Oxygen(5L) Activity Tolerance: Patient limited by fatigue Patient left: in bed;with call bell/phone within reach;with family/visitor present Nurse Communication: Mobility status;Need for lift equipment PT Visit Diagnosis: Other abnormalities of gait and mobility (R26.89)     Time: 2233-6122 PT Time Calculation (min) (ACUTE ONLY): 30 min  Charges:  $Therapeutic Activity: 23-37 mins                     Earney Navy, PTA Pager: [phone]     Darliss Cheney 11/14/2017, 3:34 PM

## 2017-11-14 NOTE — Progress Notes (Signed)
PROGRESS NOTE  Levi Lewis  KGY:[id number] DOB: 1917/06/30 DOA: 11/08/2017 PCP: [name], MD  Outpatient Specialists: Cardiology, [facility] Brief Narrative: Levi Lewis is a 84 y.o. male with a history of chronic HFrEF, gout/arthritis on chronic prednisone, persistent AFib s/p BiV PPM and stage III CKD who presented to the ED about 3 weeks following the death of his wife of 47 years with right arm pain, swelling and low blood pressure associated with generalized weakness. WBC 14k, Cr up to 3.2 from baseline of 2, UA and CXR without acute infection. He was admitted for dehydration causing hypotension with some improvement after IV fluids and given ceftriaxone for right arm cellulitis. U/S ruled out DVT/SVT. He also required I/O cath for urinary retention which is resolved. AKI was also noted, improved with IVF's. Renal U/S showed cortical atrophy and no hydronephrosis. Cardiology was consulted and confirmed that the patient is not a candidate for advanced HF therapies, echocardiogram showing severely hypokinetic LV with EF 20-25% as well as biatral enlargement and valvular disease. He developed pulmonary edema briefly requiring BiPAP without further escalation due to DNR status. He fortunately responded to IV lasix and has also noted improved renal function. Palliative care has been consulted and met with patient and family. Goals of care discussions have been ongoing. The patient and family wish for the patient to return home with hospice. Anticipate he'll be optimized 7/31.   Assessment & Plan: Principal Problem:   Hypotension Active Problems:   CAD (coronary artery disease)   Atrial fibrillation (HCC)   Pacemaker   Chronic systolic CHF (congestive heart failure) (HCC)   Acute on chronic renal failure (HCC)   Arm swelling right   AKI (acute kidney injury) ([place])   Palliative care encounter  Acute on chronic hypoxic respiratory failure, COPD, pulmonary edema: Developed severe respiratory  distress 7/26 with flash pulmonary edema, slowly improving, still needing negative balance. DC zosyn (initial concern for aspiration but no fever, cough, or choking, mild aspiration risk per SLP evaluation). CXR actually appears worse today, though diuresis has been brisk. - Continue supplemental oxygen and prn breathing treatments.  - Stable for telemetry, having not required BiPAP for a couple days.   Hypotension: Possibly related to cellulitis with relative adrenal insufficiency vs. progressive heart failure/broken heart syndrome. No evidence of bleeding. Note low-normal BPs as outpatient. Leukocytosis not likely to be reliable in setting of steroids. TSH is suppressed but T4 is normal.  - Cardiology consulted in light of cardiac history, appreciate their recommendations.  - Stress steroids provided and tapered in light of worsening pulmonary edema. Will change back to po prednisone 7/31.  Right arm cellulitis and right hand abscess and right olecranon bursitis: s/p I&D 7/29. No evidence of clot on U/S. Has h/o bursitis and has developed similar clinical findings. - Will narrow antibiotics based on culture data showing S. aureus. Doxycycline continued.  - Follow up with orthopedics in the next 1-2 weeks for wound recheck - Elevate right arm.  Acute on chronic systolic CHF: EF 02-63% stable this admission. Unable to determine diastolic function, though severe biatrial enlargement, mod-severe MR, mild AR, moderate TR with moderately increased PASP. No focal LV ballooning, etc. to definitively determine takotsubo.  - Cardiology confirms pt not candidate for advanced therapies. Holding beta blocker with hypotension, no ACE/ARB due to renal failure.  - Continue IV lasix and therefore also foley. Monitoring creatinine, UOP closely. Down nearly 4L yesterday and creatinine improved near baseline. Suspect he'll be at/near dry weight  in the next 24 hours and can transition back to po.  AKI on stage IV CKD:  Suspected to be due to intravascular volume depletion. Improved with IVF's. Possibly hemodynamically mediated with ongoing hypotension, as well as due to NSAID nephropathy (taking etodolac multiple times/day for right arm pain), possibly urinary retention as well (resolved) and ?cardiorenal. Renal U/S with cortical thinning suggestive of known CKD without hydronephrosis.  Discussed that not a HD candidate were renal function to decline. - Creatinine improving, following daily. Strict I/O with foley given ongoing aggressive diuresis, acuity of illness.  - Continues negative balance.  - CrCl still below 22m/min.   Acute delirium: Hallucinations, disorientation waxing/waning worst at night. Counseled pt and family that this is difficult to treat at his age and we're most interested in mitigating complications/injury. Needs to get back to a familiar surroundings to definitively help. - Discussed delirium precautions with pt and family. Family to stay at bedside as much as possible.  - Continue pt's home qHS triazolam and zyprexa qHS, had some improvement last night. - Tapering steroids.  Acute urinary retention due to BPH: Prostate enlargement (4.3cm) on U/S is stable.  - Continue foley catheter (inserted 7/26), holding flomax with hypotension.  AFib, has BiV pacer for resynchronization therapy:  - Monitor HR, holding beta blocker with hypotension - Stopped eliquis with significant risk of bleeding and current renal failure. Will not restart anticoagulation.    Grief reaction: Appropriate bereavement with wife of 680years passing earlier this month, currently lives alone.  - Pt's pastor has been by to see him several times.  - [place] [facility]   Gout: Chronic.  - Continue febuxostat (continued from outpatient setting) as this is a significant benefit to a significant symptomatic burden to the patient, can discuss with patient the recent findings of CV mortality with this drug.  - On  steroids as above, due to chronic prednisone, will need prolonged taper  Hypothyroidism:  - Continue synthroid  GERD:  - PPI  DVT prophylaxis: SCDs Code Status: Full Family Communication: Son at bedside this AM. Daughter and son at bedside this PM Disposition Plan: Home with hospice 7/31.   Consultants:   Cardiology  Palliative care team  Orthopedics  Procedures:   None  Antimicrobials:  Zosyn 7/24, 7/26  Ceftriaxone 7/24 >>    Subjective: Slept much better last night and may be catching up on rest today, sleeping more. Coughing some, but no respiratory distress. Right hand swelling improved, had lots of pain during I&D but improved now.   Objective: Vitals:   11/14/17 0621 11/14/17 0800 11/14/17 1000 11/14/17 1200  BP: 102/63 106/65    Pulse:  (!) 107  (!) 58  Resp: (!) 30 (!) 21  (!) 26  Temp:  98.2 F (36.8 C)    TempSrc:  Oral    SpO2:  93% 94% 97%  Weight:      Height:        Intake/Output Summary (Last 24 hours) at 11/14/2017 1624 Last data filed at 11/14/2017 0800 Gross per 24 hour  Intake 530 ml  Output 1770 ml  Net -1240 ml   Filed Weights   11/12/17 0606 11/12/17 2300 11/14/17 0417  Weight: 81.6 kg (180 lb) 83 kg (183 lb) 83 kg (183 lb)   Gen: Tired-appearing, elderly male sleeping in no distress Pulm: Nonlabored breathing O2. Decreased bilaterally. CV: Rapid irreg, no JVD, trace LE edema.  GI: Abdomen soft, non-tender, non-distended, with normoactive bowel sounds.  Ext: right elbow with fluctuant bursitis mildly tender. Right distal arm erythema improved, swelling drastically improved. Bandage on right hand with dried blood. No odor or purulence. Skin: Thin skin with scattered tears, none new.  Neuro: Waxing and waning sensorium while speaking at length with family, but able to directly answer some questions. No focal deficits.  Psych: Judgement and insight appear fair, episodically absent due to delirium Mood ok. +Hallucinations.    CBC: Recent Labs  Lab 11/08/17 1326 11/09/17 0002 11/10/17 0451 11/11/17 0313 11/13/17 0537  WBC 14.4* 12.6* 14.5* 12.5* 9.6  HGB 11.7* 12.0* 10.5* 11.0* 11.1*  HCT 37.3* 37.7* 32.6* 34.5* 34.6*  MCV 101.1* 100.8* 100.0 99.4 98.9  PLT 213 212 224 248 638   Basic Metabolic Panel: Recent Labs  Lab 11/10/17 0451 11/11/17 0313 11/12/17 0653 11/13/17 0537 11/14/17 0526  NA 139 141 141 145 138  K 4.7 4.2 3.2* 3.8 3.4*  CL 107 110 105 106 102  CO2 21* 19* 23 29 26   GLUCOSE 167* 127* 103* 107* 96  BUN 53* 52* 47* 39* 39*  CREATININE 2.90* 2.85* 2.46* 2.18* 2.00*  CALCIUM 8.2* 8.5* 8.1* 8.3* 8.1*   GFR: Estimated Creatinine Clearance: 22.6 mL/min (A) (by C-G formula based on SCr of 2 mg/dL (H)).   Thyroid Function Tests: No results for input(s): TSH, T4TOTAL, FREET4, T3FREE, THYROIDAB in the last 72 hours. Urine analysis:    Component Value Date/Time   COLORURINE YELLOW 11/08/2017 1646   APPEARANCEUR CLEAR 11/08/2017 1646   LABSPEC 1.006 11/08/2017 1646   PHURINE 5.0 11/08/2017 1646   GLUCOSEU NEGATIVE 11/08/2017 1646   HGBUR NEGATIVE 11/08/2017 1646   BILIRUBINUR NEGATIVE 11/08/2017 1646   KETONESUR NEGATIVE 11/08/2017 1646   PROTEINUR NEGATIVE 11/08/2017 1646   UROBILINOGEN 1.0 02/19/2015 1815   NITRITE NEGATIVE 11/08/2017 1646   LEUKOCYTESUR NEGATIVE 11/08/2017 1646   Radiology Studies: Dg Chest Port 1 View  Result Date: 11/14/2017 CLINICAL DATA:  84 year old male with pulmonary edema. Subsequent encounter. EXAM: PORTABLE CHEST 1 VIEW COMPARISON:  11/12/2017. FINDINGS: Worsening asymmetric pulmonary edema with bilateral pleural effusions superimposed upon chronic lung changes. Cardiomegaly with biventricular pacer in place. Calcified aorta. No acute osseous abnormality. IMPRESSION: Worsening asymmetric pulmonary edema with bilateral pleural effusions superimposed upon chronic lung changes. Cardiomegaly with biventricular pacer in place. Aortic Atherosclerosis  (ICD10-I70.0). Electronically Signed   By: Genia Del M.D.   On: 11/14/2017 09:24    LOS: 6 days   Time spent: 35 minutes.  Patrecia Pour, MD [facility] [email or web address] Password [facility] 11/14/2017, 4:24 PM

## 2017-11-14 NOTE — NC FL2 (Signed)
[place] MEDICAID FL2 LEVEL OF CARE SCREENING TOOL     IDENTIFICATION  Patient Name: Levi Lewis Birthdate: 09-14-1917 Sex: male Admission Date (Current Location): 11/08/2017  [facility] and [place] Number:  Herbalist and Address:  [facility], [place] [address] [place], [place] [address]      Provider Number: [id number]  Attending Physician Name and Address:  Patrecia Pour, MD  Relative Name and Phone Number:  Legrand Como, son, [phone]    Current Level of Care: Hospital Recommended Level of Care: [place] Prior Approval Number:    Date Approved/Denied:   PASRR Number: [id number]  Discharge Plan: SNF    Current Diagnoses: Patient Active Problem List   Diagnosis Date Noted  . AKI (acute kidney injury) ([place])   . Palliative care encounter   . Acute on chronic renal failure ([place]) 11/08/2017  . Arm swelling right 11/08/2017  . Acute on chronic systolic HF (heart failure) ([place]) 03/15/2016  . Acute on chronic systolic CHF (congestive heart failure) ([place])   . Acute hypoxemic respiratory failure ([place]) 10/18/2015  . Fall 10/18/2015  . Suprapubic pain 10/23/2013  . HCAP (healthcare-associated pneumonia) 10/20/2013  . Sepsis ([place]) 10/19/2013  . Hypotension 10/19/2013  . Fatigue 10/15/2013  . Multiple blisters 09/03/2013  . Hypothyroidism 08/05/2013  . Pacemaker 08/04/2013  . Chronic systolic CHF (congestive heart failure) ([place]) 08/04/2013  . HX: anticoagulation 08/04/2013  . CKD (chronic kidney disease) stage 4, GFR 15-29 ml/min (HCC) 07/14/2013  . Cardiomyopathy, EF by echo 25-30% 07/14/2013  . Atrial fibrillation ([place]) 07/13/2013  . Alternating (bilateral) bundle branch block 07/13/2013  . Back pain   . Shoulder pain   . Left bundle branch block   . CAD (coronary artery disease)   . Hypertension   . Gout   . Dyslipidemia   . Ejection fraction   . Lung nodules   . COPD (chronic obstructive pulmonary disease) ([place])    . Mitral regurgitation   . Aortic insufficiency     Orientation RESPIRATION BLADDER Height & Weight     Self, Situation, Place  O2(Nasal cannula 5L) Continent, External catheter Weight: 83 kg (183 lb) Height:  6' (182.9 cm)  BEHAVIORAL SYMPTOMS/MOOD NEUROLOGICAL BOWEL NUTRITION STATUS      Continent Diet(Please see DC Summary)  AMBULATORY STATUS COMMUNICATION OF NEEDS Skin   Extensive Assist Verbally Other (Comment)(Wound on hand)                       Personal Care Assistance Level of Assistance  Bathing, Feeding, Dressing Bathing Assistance: Maximum assistance Feeding assistance: Limited assistance Dressing Assistance: Limited assistance     Functional Limitations Info  Sight, Hearing, Speech Sight Info: Adequate Hearing Info: Impaired Speech Info: Adequate    SPECIAL CARE FACTORS FREQUENCY  PT (By licensed PT), OT (By licensed OT)     PT Frequency: 5x/week OT Frequency: 3x/week            Contractures Contractures Info: Not present    Additional Factors Info  Code Status, Allergies Code Status Info: DNR Allergies Info: NKA           Current Medications (11/14/2017):  This is the current hospital active medication list Current Facility-Administered Medications  Medication Dose Route Frequency Provider Last Rate Last Dose  . 0.9 %  sodium chloride infusion   Intravenous Continuous Patrecia Pour, MD   Stopped at 11/09/17 2316  . acetaminophen (TYLENOL) tablet 650 mg  650 mg Oral Q6H PRN Patrecia Pour, MD   650 mg at 11/14/17 0865   Or  . acetaminophen (TYLENOL) suppository 650 mg  650 mg Rectal Q6H PRN Patrecia Pour, MD      . doxycycline (VIBRA-TABS) tablet 100 mg  100 mg Oral Q12H Patrecia Pour, MD   100 mg at 11/13/17 2134  . febuxostat (ULORIC) tablet 40 mg  40 mg Oral Daily Patrecia Pour, MD   40 mg at 11/13/17 1100  . fenofibrate tablet 54 mg  54 mg Oral Daily Vance Gather B, MD   54 mg at 11/13/17 1100  . furosemide (LASIX) injection 80 mg  80  mg Intravenous Q12H Patrecia Pour, MD   80 mg at 11/14/17 0326  . gabapentin (NEURONTIN) capsule 300 mg  300 mg Oral BID Patrecia Pour, MD   300 mg at 11/13/17 2134  . hydrocortisone sodium succinate (SOLU-CORTEF) 100 MG injection 25 mg  25 mg Intravenous Daily Patrecia Pour, MD   25 mg at 11/13/17 1101  . levalbuterol (XOPENEX) nebulizer solution 0.63 mg  0.63 mg Nebulization Q6H PRN Patrecia Pour, MD      . levalbuterol Penne Lash) nebulizer solution 1.25 mg  1.25 mg Nebulization BID Patrecia Pour, MD   1.25 mg at 11/14/17 0805  . levothyroxine (SYNTHROID, LEVOTHROID) tablet 75 mcg  75 mcg Oral QAC breakfast Patrecia Pour, MD   75 mcg at 11/14/17 0806  . MEDLINE mouth rinse  15 mL Mouth Rinse BID Patrecia Pour, MD   15 mL at 11/13/17 1101  . OLANZapine (ZYPREXA) tablet 2.5 mg  2.5 mg Oral QHS Patrecia Pour, MD   2.5 mg at 11/13/17 2134  . omega-3 acid ethyl esters (LOVAZA) capsule 2 g  2 g Oral Daily Patrecia Pour, MD   2 g at 11/13/17 1100  . pantoprazole (PROTONIX) EC tablet 40 mg  40 mg Oral Daily Patrecia Pour, MD   40 mg at 11/13/17 1100  . senna-docusate (Senokot-S) tablet 1 tablet  1 tablet Oral QHS PRN Patrecia Pour, MD      . simethicone [facility]) chewable tablet 80 mg  80 mg Oral QID PRN Patrecia Pour, MD      . triazolam (HALCION) tablet 0.125 mg  0.125 mg Oral QHS PRN Patrecia Pour, MD   0.125 mg at 11/09/17 2204  . vitamin B-12 (CYANOCOBALAMIN) tablet 100 mcg  100 mcg Oral Daily Patrecia Pour, MD   100 mcg at 11/13/17 1100  . vitamin E capsule 100 Units  100 Units Oral Daily Patrecia Pour, MD   100 Units at 11/13/17 1100     Discharge Medications: Please see discharge summary for a list of discharge medications.  Relevant Imaging Results:  Relevant Lab Results:   Additional Information SSN: [place], [place]

## 2017-11-14 NOTE — Progress Notes (Signed)
Progress Note  Patient Name: Levi Lewis Date of Encounter: 11/14/2017  Primary Cardiologist:   Candee Furbish, MD   Subjective   Right had debrided yesterday.   He is somnolent.  No distress.  Very weak and dyspnea with minimal exertion per family.   Inpatient Medications    Scheduled Meds: . doxycycline  100 mg Oral Q12H  . febuxostat  40 mg Oral Daily  . fenofibrate  54 mg Oral Daily  . furosemide  80 mg Intravenous Q12H  . gabapentin  300 mg Oral BID  . hydrocortisone sodium succinate  25 mg Intravenous Daily  . levalbuterol  1.25 mg Nebulization BID  . levothyroxine  75 mcg Oral QAC breakfast  . mouth rinse  15 mL Mouth Rinse BID  . OLANZapine  2.5 mg Oral QHS  . omega-3 acid ethyl esters  2 g Oral Daily  . pantoprazole  40 mg Oral Daily  . vitamin B-12  100 mcg Oral Daily  . vitamin E  100 Units Oral Daily   Continuous Infusions: . sodium chloride Stopped (11/09/17 2316)   PRN Meds: acetaminophen **OR** acetaminophen, levalbuterol, senna-docusate, simethicone, triazolam   Vital Signs    Vitals:   11/14/17 0621 11/14/17 0800 11/14/17 1000 11/14/17 1200  BP: 102/63 106/65    Pulse:  (!) 107  (!) 58  Resp: (!) 30 (!) 21  (!) 26  Temp:  98.2 F (36.8 C)    TempSrc:  Oral    SpO2:  93% 94% 97%  Weight:      Height:        Intake/Output Summary (Last 24 hours) at 11/14/2017 1241 Last data filed at 11/14/2017 0800 Gross per 24 hour  Intake 880 ml  Output 3120 ml  Net -2240 ml   Filed Weights   11/12/17 0606 11/12/17 2300 11/14/17 0417  Weight: 180 lb (81.6 kg) 183 lb (83 kg) 183 lb (83 kg)    Telemetry    Atrial fib with intermittent rapid rate an PVCs - Personally Reviewed  ECG    NA  Physical Exam   GENERAL:  Well appearing NECK:  No jugular venous distention, waveform within normal limits, carotid upstroke brisk and symmetric, no bruits, no thyromegaly LUNGS:  Clear to auscultation bilaterally CHEST:  Unremarkable HEART:  PMI not  displaced or sustained,S1 and S2 within normal limits, no S3, no S4, no clicks, no rubs, distant heart sounds.  ABD:  Flat, positive bowel sounds normal in frequency in pitch, no bruits, no rebound, no guarding, no midline pulsatile mass, no hepatomegaly, no splenomegaly EXT:  2 plus pulses throughout, no edema, no cyanosis no clubbing   Labs    Chemistry Recent Labs  Lab 11/12/17 0653 11/13/17 0537 11/14/17 0526  NA 141 145 138  K 3.2* 3.8 3.4*  CL 105 106 102  CO2 23 29 26   GLUCOSE 103* 107* 96  BUN 47* 39* 39*  CREATININE 2.46* 2.18* 2.00*  CALCIUM 8.1* 8.3* 8.1*  GFRNONAA 20* 24* 26*  GFRAA 24* 27* 30*  ANIONGAP 13 10 10      Hematology Recent Labs  Lab 11/10/17 0451 11/11/17 0313 11/13/17 0537  WBC 14.5* 12.5* 9.6  RBC 3.26* 3.47* 3.50*  HGB 10.5* 11.0* 11.1*  HCT 32.6* 34.5* 34.6*  MCV 100.0 99.4 98.9  MCH 32.2 31.7 31.7  MCHC 32.2 31.9 32.1  RDW 14.7 14.7 14.3  PLT 224 248 236    Cardiac EnzymesNo results for input(s): TROPONINI in the last 168  hours.  Recent Labs  Lab 11/08/17 1333  TROPIPOC 0.03     BNPNo results for input(s): BNP, PROBNP in the last 168 hours.   DDimer No results for input(s): DDIMER in the last 168 hours.   Radiology    Dg Chest Port 1 View  Result Date: 11/14/2017 CLINICAL DATA:  84 year old male with pulmonary edema. Subsequent encounter. EXAM: PORTABLE CHEST 1 VIEW COMPARISON:  11/12/2017. FINDINGS: Worsening asymmetric pulmonary edema with bilateral pleural effusions superimposed upon chronic lung changes. Cardiomegaly with biventricular pacer in place. Calcified aorta. No acute osseous abnormality. IMPRESSION: Worsening asymmetric pulmonary edema with bilateral pleural effusions superimposed upon chronic lung changes. Cardiomegaly with biventricular pacer in place. Aortic Atherosclerosis (ICD10-I70.0). Electronically Signed   By: Genia Del M.D.   On: 11/14/2017 09:24    Cardiac Studies   NA   Patient Profile     84  y.o. male  with a hx of dilated cardiomyopathy, chronic systolic HF with EF of 37-10%, s/p Bi-V pacemaker followed by Dr. Caryl Comes, persistent atrial fibrillation w/ failed cardioversion 06/2016, chronic anticoagulation therapy with Eliquis, CKD and h/o CAD, who is being seen for the evaluation of persistent hypotension, at the request of Dr. Bonner Puna, Internal Medicine.   Assessment & Plan    HYPOTENSION:  BP low but stable.  No change in therapy.  Unable to titrate meds.    ATRIAL FIB:   Eliquis on hold.   I discussed with the family.  My suggestion is no warfarin which is the only anticoagulant that I would suggest with his current creat.  However, could restart Eliquis later if creat allows.    CM:   Continue medical management.  No advanced therapies.   He has tolerated diuresis.  Down about 3.7 liters since admission.  Still very SOB.  I would continue IV Lasix today but likely will change to PO in the AM.   AKI:  Creat improved further.  Follow.  ACUTE ON CHRONIC SYSTOLIC AND DIASTOLIC HF:  For today I think that he is tolerating the IV Lasix and I will continue this.    For questions or updates, please contact [place] Please consult [email or web address] for contact info under Cardiology/STEMI.   Signed, Minus Breeding, MD  11/14/2017, 12:41 PM

## 2017-11-14 NOTE — Progress Notes (Addendum)
Pt states he would like to go home with 24 hour care, family in agreement with plan, will need professional assistance at home.  Pt is too weak to sit independently on the side of the bed.

## 2017-11-15 ENCOUNTER — Inpatient Hospital Stay (HOSPITAL_COMMUNITY): Payer: Medicare Other

## 2017-11-15 DIAGNOSIS — N179 Acute kidney failure, unspecified: Principal | ICD-10-CM

## 2017-11-15 DIAGNOSIS — Z515 Encounter for palliative care: Secondary | ICD-10-CM

## 2017-11-15 DIAGNOSIS — J9601 Acute respiratory failure with hypoxia: Secondary | ICD-10-CM

## 2017-11-15 DIAGNOSIS — J810 Acute pulmonary edema: Secondary | ICD-10-CM

## 2017-11-15 DIAGNOSIS — L03113 Cellulitis of right upper limb: Secondary | ICD-10-CM

## 2017-11-15 LAB — BASIC METABOLIC PANEL
ANION GAP: 13 (ref 5–15)
BUN: 41 mg/dL — ABNORMAL HIGH (ref 8–23)
CHLORIDE: 99 mmol/L (ref 98–111)
CO2: 27 mmol/L (ref 22–32)
CREATININE: 2.09 mg/dL — AB (ref 0.61–1.24)
Calcium: 8.1 mg/dL — ABNORMAL LOW (ref 8.9–10.3)
GFR calc non Af Amer: 25 mL/min — ABNORMAL LOW (ref >60)
GFR, EST AFRICAN AMERICAN: 29 mL/min — AB (ref >60)
Glucose, Bld: 98 mg/dL (ref 70–99)
POTASSIUM: 3.5 mmol/L (ref 3.5–5.1)
Sodium: 139 mmol/L (ref 135–145)

## 2017-11-15 LAB — SYNOVIAL CELL COUNT + DIFF, W/ CRYSTALS
EOSINOPHILS-SYNOVIAL: 0 % (ref 0–1)
Lymphocytes-Synovial Fld: 2 % (ref 0–20)
MONOCYTE-MACROPHAGE-SYNOVIAL FLUID: 6 % — AB (ref 50–90)
Neutrophil, Synovial: 92 % — ABNORMAL HIGH (ref 0–25)
WBC, Synovial: 31400 /mm3 — ABNORMAL HIGH (ref 0–200)

## 2017-11-15 LAB — AEROBIC CULTURE  (SUPERFICIAL SPECIMEN)

## 2017-11-15 LAB — AEROBIC CULTURE W GRAM STAIN (SUPERFICIAL SPECIMEN)

## 2017-11-15 MED ORDER — OLANZAPINE 2.5 MG PO TABS
2.5000 mg | ORAL_TABLET | Freq: Every day | ORAL | 0 refills | Status: AC
Start: 2017-11-15 — End: ?

## 2017-11-15 MED ORDER — LEVALBUTEROL HCL 1.25 MG/0.5ML IN NEBU
1.2500 mg | INHALATION_SOLUTION | Freq: Three times a day (TID) | RESPIRATORY_TRACT | Status: DC
  Administered 2017-11-15 (×2): 1.25 mg via RESPIRATORY_TRACT
  Filled 2017-11-15 (×2): qty 0.5

## 2017-11-15 MED ORDER — METHYLPREDNISOLONE ACETATE 40 MG/ML IJ SUSP
40.0000 mg | Freq: Once | INTRAMUSCULAR | Status: AC
  Administered 2017-11-15: 40 mg via INTRA_ARTICULAR
  Filled 2017-11-15: qty 1

## 2017-11-15 MED ORDER — CEPHALEXIN 250 MG PO CAPS: 250.0000 mg | ORAL_CAPSULE | Freq: Three times a day (TID) | ORAL | 0 refills | Status: AC

## 2017-11-15 MED ORDER — HYDROMORPHONE HCL 2 MG PO TABS: 1.0000 mg | ORAL_TABLET | ORAL | 0 refills | Status: AC | PRN

## 2017-11-15 MED ORDER — ALBUTEROL SULFATE (2.5 MG/3ML) 0.083% IN NEBU: 2.5000 mg | INHALATION_SOLUTION | RESPIRATORY_TRACT | 0 refills | Status: AC | PRN

## 2017-11-15 MED ORDER — LEVALBUTEROL HCL 1.25 MG/3ML IN NEBU: 1.2500 mg | INHALATION_SOLUTION | RESPIRATORY_TRACT | 0 refills | Status: AC | PRN

## 2017-11-15 MED ORDER — BUPIVACAINE HCL (PF) 0.5 % IJ SOLN
10.0000 mL | Freq: Once | INTRAMUSCULAR | Status: AC
  Administered 2017-11-15: 10 mL
  Filled 2017-11-15: qty 10

## 2017-11-15 NOTE — Progress Notes (Signed)
Levi Lewis discharged Home with Hospice per MD order.  Discharge instructions reviewed and discussed with the patient, all questions and concerns answered. Copy of instructions and scripts given to patient.  Allergies as of 11/15/2017   No Known Allergies     Medication List    STOP taking these medications   carvedilol 6.25 MG tablet Commonly known as:  COREG   ELIQUIS 2.5 MG Tabs tablet Generic drug:  apixaban   etodolac 400 MG tablet Commonly known as:  LODINE     TAKE these medications   acetaminophen 325 MG tablet Commonly known as:  TYLENOL Take 650 mg by mouth every 6 (six) hours as needed for mild pain, fever or headache.   albuterol (2.5 MG/3ML) 0.083% nebulizer solution Commonly known as:  PROVENTIL Take 3 mLs (2.5 mg total) by nebulization every 4 (four) hours as needed for wheezing or shortness of breath.   cephALEXin 250 MG capsule Commonly known as:  KEFLEX Take 1 capsule (250 mg total) by mouth 3 (three) times daily.   fenofibrate 48 MG tablet Commonly known as:  TRICOR TAKE 1/2 TABLET BY MOUTH EVERY DAY   fish oil-omega-3 fatty acids 1000 MG capsule Take 2 g by mouth daily.   gabapentin 300 MG capsule Commonly known as:  NEURONTIN Take 300 mg by mouth 2 (two) times daily.   guaiFENesin 600 MG 12 hr tablet Commonly known as:  MUCINEX Take 600 mg by mouth 2 (two) times daily as needed for cough or to loosen phlegm (COLDS).   HYDROmorphone 2 MG tablet Commonly known as:  DILAUDID Take 0.5-1 tablets (1-2 mg total) by mouth every 4 (four) hours as needed for up to 3 days for moderate pain or severe pain (or severe/refractory shortness of breath).   KLOR-CON M20 20 MEQ tablet Generic drug:  potassium chloride SA TAKE 1 TABLET BY MOUTH EVERY DAY   levalbuterol 1.25 MG/3ML nebulizer solution Commonly known as:  XOPENEX Take 1.25 mg by nebulization every 4 (four) hours as needed for wheezing or shortness of breath.   levothyroxine 75 MCG  tablet Commonly known as:  SYNTHROID, LEVOTHROID Take 1 tablet (75 mcg total) by mouth daily.   OCUVITE PO Take 2 tablets by mouth daily.   OLANZapine 2.5 MG tablet Commonly known as:  ZYPREXA Take 1 tablet (2.5 mg total) by mouth at bedtime.   omeprazole 20 MG capsule Commonly known as:  PRILOSEC Take 20 mg by mouth daily.   predniSONE 10 MG tablet Commonly known as:  DELTASONE Take 10 mg by mouth daily.   tamsulosin 0.4 MG Caps capsule Commonly known as:  FLOMAX Take 0.4 mg by mouth 2 (two) times daily.   torsemide 20 MG tablet Commonly known as:  DEMADEX Take 4 tablets (80 mg total) by mouth daily.   triazolam 0.25 MG tablet Commonly known as:  HALCION Take 0.5 tablets (0.125 mg total) by mouth at bedtime as needed for sleep.   ULORIC 40 MG tablet Generic drug:  febuxostat Take 40 mg by mouth daily.   vitamin B-12 100 MCG tablet Commonly known as:  CYANOCOBALAMIN Take 100 mcg by mouth daily.   vitamin E 100 UNIT capsule Take 100 Units by mouth daily.            Durable Medical Equipment  (From admission, onward)        Start     Ordered   11/15/17 1500  For home use only DME oxygen  Once  Question Answer Comment  Mode or (Route) Nasal cannula   Liters per Minute 4   Frequency Continuous (stationary and portable oxygen unit needed)   Oxygen delivery system Gas      11/15/17 1459   11/15/17 1357  For home use only DME Hospital bed  Once    Question:  Bed type  Answer:  Semi-electric   11/15/17 1356   11/15/17 1357  For home use only DME Nebulizer/meds  Once    Question Answer Comment  Patient needs a nebulizer to treat with the following condition COPD (chronic obstructive pulmonary disease) ([place])   Patient needs a nebulizer to treat with the following condition Acute respiratory failure ([place])      11/15/17 1356      Patients has small wound to R hand and cellulitis to R hand and arm. Inflammation noted to R knee, elbow, and finger joints.  IV site discontinued and catheter remains intact. Site without signs and symptoms of complications. Dressing and pressure applied. Patient unable to take bedtime beds due to lethargy. Family made aware.  Patient transported home via [facility] with condom cath and oxygen, no distress noted upon discharge.  Home health equipment has been delivered to home per daughter.   Levi Lewis 11/15/2017 11:55 PM

## 2017-11-15 NOTE — Progress Notes (Addendum)
Pt will home home with hospice tonight, transported by [facility] who has been called.  Pt has a small wound on right hand and cellulitis to right hand and arm.  Pt has inflammation in right knee and elbow.  AVS completed. Copy given to daughter at bedside.  Home health equipment has been delivered per family. Pt will transport home with condom catheter and oxygen.

## 2017-11-15 NOTE — Progress Notes (Signed)
Progress Note  Patient Name: Levi Lewis Date of Encounter: 11/15/2017  Primary Cardiologist:   Candee Furbish, MD   Subjective   His hand is still sore and now with knee pain.   Inpatient Medications    Scheduled Meds: . doxycycline  100 mg Oral Q12H  . febuxostat  40 mg Oral Daily  . fenofibrate  54 mg Oral Daily  . furosemide  80 mg Intravenous Q12H  . gabapentin  300 mg Oral BID  . levalbuterol  1.25 mg Nebulization BID  . levothyroxine  75 mcg Oral QAC breakfast  . mouth rinse  15 mL Mouth Rinse BID  . OLANZapine  2.5 mg Oral QHS  . omega-3 acid ethyl esters  2 g Oral Daily  . pantoprazole  40 mg Oral Daily  . predniSONE  10 mg Oral Q breakfast  . vitamin B-12  100 mcg Oral Daily  . vitamin E  100 Units Oral Daily   Continuous Infusions: . sodium chloride Stopped (11/09/17 2316)   PRN Meds: acetaminophen **OR** acetaminophen, levalbuterol, senna-docusate, simethicone, triazolam   Vital Signs    Vitals:   11/14/17 2117 11/15/17 0340 11/15/17 0405 11/15/17 0630  BP:   (!) 81/50 (!) 83/48  Pulse:   77 79  Resp:  (!) 21 20 18   Temp:   97.8 F (36.6 C)   TempSrc:   Oral   SpO2: 96% 97% 96% 97%  Weight:      Height:       No intake or output data in the 24 hours ending 11/15/17 0858 Filed Weights   11/12/17 0606 11/12/17 2300 11/14/17 0417  Weight: 180 lb (81.6 kg) 183 lb (83 kg) 183 lb (83 kg)    Telemetry    Atrial fib with intermittent pacing.  Rapid rate.   - Personally Reviewed  ECG    NA  Physical Exam   GEN: Frail appearing Neck: No  JVD Cardiac: Irregular RR, distant heart sounds, rubs, or gallops.  Respiratory:   Decreased breath sounds.  GI: Soft, nontender, non-distended, normal bowel sounds  MS:  Mild edema; No deformity. Neuro:   Nonfocal    Labs    Chemistry Recent Labs  Lab 11/13/17 0537 11/14/17 0526 11/15/17 0344  NA 145 138 139  K 3.8 3.4* 3.5  CL 106 102 99  CO2 29 26 27   GLUCOSE 107* 96 98  BUN 39* 39* 41*    CREATININE 2.18* 2.00* 2.09*  CALCIUM 8.3* 8.1* 8.1*  GFRNONAA 24* 26* 25*  GFRAA 27* 30* 29*  ANIONGAP 10 10 13      Hematology Recent Labs  Lab 11/10/17 0451 11/11/17 0313 11/13/17 0537  WBC 14.5* 12.5* 9.6  RBC 3.26* 3.47* 3.50*  HGB 10.5* 11.0* 11.1*  HCT 32.6* 34.5* 34.6*  MCV 100.0 99.4 98.9  MCH 32.2 31.7 31.7  MCHC 32.2 31.9 32.1  RDW 14.7 14.7 14.3  PLT 224 248 236    Cardiac EnzymesNo results for input(s): TROPONINI in the last 168 hours.  Recent Labs  Lab 11/08/17 1333  TROPIPOC 0.03     BNPNo results for input(s): BNP, PROBNP in the last 168 hours.   DDimer No results for input(s): DDIMER in the last 168 hours.   Radiology    Dg Chest Port 1 View  Result Date: 11/15/2017 CLINICAL DATA:  Respiratory distress. Cough. Congestive heart failure. Follow-up. EXAM: PORTABLE CHEST 1 VIEW COMPARISON:  11/14/2017 FINDINGS: Cardiomegaly as seen previously. Aortic atherosclerosis. Pacemaker leads appear the  same. Radiographic improvement with less interstitial and alveolar edema. Some edema does persist. Small effusions left more than right with basilar atelectasis. IMPRESSION: Radiographic improvement in congestive heart failure pattern. Electronically Signed   By: Nelson Chimes M.D.   On: 11/15/2017 08:31   Dg Chest Port 1 View  Result Date: 11/14/2017 CLINICAL DATA:  84 year old male with pulmonary edema. Subsequent encounter. EXAM: PORTABLE CHEST 1 VIEW COMPARISON:  11/12/2017. FINDINGS: Worsening asymmetric pulmonary edema with bilateral pleural effusions superimposed upon chronic lung changes. Cardiomegaly with biventricular pacer in place. Calcified aorta. No acute osseous abnormality. IMPRESSION: Worsening asymmetric pulmonary edema with bilateral pleural effusions superimposed upon chronic lung changes. Cardiomegaly with biventricular pacer in place. Aortic Atherosclerosis (ICD10-I70.0). Electronically Signed   By: Genia Del M.D.   On: 11/14/2017 09:24     Cardiac Studies   NA   Patient Profile     84 y.o. male  with a hx of dilated cardiomyopathy, chronic systolic HF with EF of 54-56%, s/p Bi-V pacemaker followed by Dr. Caryl Comes, persistent atrial fibrillation w/ failed cardioversion 06/2016, chronic anticoagulation therapy with Eliquis, CKD and h/o CAD, who is being seen for the evaluation of persistent hypotension, at the request of Dr. Bonner Puna, Internal Medicine.   Assessment & Plan    HYPOTENSION:  BP low but stable.  No change in therapy.  Unable to titrate meds.    ATRIAL FIB:   Eliquis on hold.   I discussed with the family.  My suggestion is no warfarin which is the only anticoagulant that I would suggest with his current creat.   Patient will be going home with hospice.   AKI:  Creat stable.     ACUTE ON CHRONIC SYSTOLIC AND DIASTOLIC HF:   He has has had radiographic improvement in his CHF.  I/O down 4 liters.  I would continue IV Lasix while he is here.  Send home on previous dose of Torsemide (80 mg daily. ).    [facility] will sign off.   Medication Recommendations:  As above Other recommendations (labs, testing, etc):  Plans per hospice Follow up as an outpatient:  No office visits scheduled.     Signed, Minus Breeding, MD  11/15/2017, 8:58 AM

## 2017-11-15 NOTE — Progress Notes (Signed)
RN asked this RT to assess pts respiratory status. RT placed pt on BIPAP 10/5 with a rate of 8, 40% FIO2. RT will continue to monitor.

## 2017-11-15 NOTE — Care Management Note (Signed)
Case Management Note  Patient Details  Name: Levi Lewis MRN: [id number] Date of Birth: [birth year]  Subjective/Objective:      Admitted with Hypotension / R arm cellulitis, Hx of  CHF,gout/arthritis on chronic prednisone, persistent AFib s/p BiV PPM and stage III CKD. Recent death of wife, 3 wks ago.     Lynnette Caffey (Son) [place] Bing (Granddaughter)      [phone]      PCP: [name]  Action/Plan: SNF vs home with hospice care, NCM and CSW following for disposition needs.  Expected Discharge Date:                  Expected Discharge Plan:     In-House Referral:  Clinical Social Work  Discharge planning Services  CM Consult  Post Acute Care Choice:    Choice offered to:     DME Arranged:    DME Agency:     HH Arranged:    HH Agency:     Status of Service:  In process, will continue to follow  If discussed at Long Length of Stay Meetings, dates discussed:    Additional Comments:  Sharin Mons, RN 11/15/2017, 10:39 AM

## 2017-11-15 NOTE — Progress Notes (Signed)
Pt on c/o of discomfort with wheezing, notified Respiratory and pt put on BiPAP. Family member at bed side.

## 2017-11-15 NOTE — Progress Notes (Signed)
[facility])  Received request from [facility] for family interest in [name] services at home after discharge. Chart and patient information reviewed and eligibility has been confirmed by [facility] physician. Family and RNCM aware.   Spoke with patient's daughter Levi Lewis at [phone] to explain services and confirm interest. Family very familiar with [facility] services for recent past experience. Per Silva Bandy plan is for patient to return home via [facility] after hospital bed is delivered to home.  Please send signed and completed out of facility DNR form home with patient.   Please send scripts for any medication patient does not already have including comfort medications.   DME needs discussed, patient already has walker and oxygen setup through [facility]). Family requesting hospital bed with half rails, OBT and nebulizer setup for home. [facility] DME specialist will order through [facility]. Family contact to coordinate with [facility] is Levi Lewis.    [facility] Referral Center aware of above and will contact family to schedule RN visit after discharge. Family has [facility] contact information.   Thank you for this referral.  Erling Conte, [place] are listed on [facility]ION.

## 2017-11-15 NOTE — Progress Notes (Signed)
Patient ID: Levi Lewis, male   DOB: 1918-03-04, 84 y.o.   MRN: [id number]   LOS: 7 days   Subjective: States hand is somewhat improved but still sore. Also c/o right knee pain, has hx/o gout, under treatment.    Objective: Vital signs in last 24 hours: Temp:  [97.8 F (36.6 C)-98 F (36.7 C)] 97.8 F (36.6 C) (07/31 0405) Pulse Rate:  [58-79] 79 (07/31 0630) Resp:  [18-26] 18 (07/31 0630) BP: (81-109)/(48-62) 83/48 (07/31 0630) SpO2:  [96 %-97 %] 97 % (07/31 0630) FiO2 (%):  [40 %] 40 % (07/31 0340) Last BM Date: (P) 11/14/17   Laboratory  CBC Recent Labs    11/13/17 0537  WBC 9.6  HGB 11.1*  HCT 34.6*  PLT 236   BMET Recent Labs    11/14/17 0526 11/15/17 0344  NA 138 139  K 3.4* 3.5  CL 102 99  CO2 26 27  GLUCOSE 96 98  BUN 39* 41*  CREATININE 2.00* 2.09*  CALCIUM 8.1* 8.1*     Physical Exam General appearance: alert and no distress  Right hand -- Edema much improved, no erythema, no expressible fluid from wound, scant discharge on dressing from last night. Right knee -- Mod effusion, no erythema, mod TTP   Assessment/Plan: Right hand abscess -- Resolved with I&D. Daily dressing changes. Right knee effusion -- Will aspirate and inject later today.    Lisette Abu, PA-C Orthopedic Surgery [phone] 11/15/2017

## 2017-11-15 NOTE — Procedures (Signed)
Procedure: Right knee aspiration and injection  Indication: Right knee effusion(s)  Surgeon: Silvestre Gunner, PA-C  Assist: None  Anesthesia: None  EBL: None  Complications: None  Findings: After risks/benefits explained patient desires to undergo procedure. Consent obtained and time out performed. The right knee was sterilely prepped and aspirated. 51ml turbid yellow fluid obtained. 75ml 0.5% Marcaine and 40mg  Depomedrol instilled. Pt tolerated the procedure well.    Lisette Abu, PA-C Orthopedic Surgery [phone]

## 2017-11-15 NOTE — Progress Notes (Signed)
Patient discharging home with hospice. CSW signing off.   Percell Locus Brooke Payes LCSW [phone]

## 2017-11-15 NOTE — Care Management Note (Signed)
Case Management Note  Patient Details  Name: Levi Lewis MRN: [id number] Date of Birth: 1917/12/25  Subjective/Objective:     Admitted with Hypotension / R arm cellulitis, Hx of  CHF,gout/arthritis on chronic prednisone, persistent AFib s/p BiV PPM and stage III CKD. Recent death of wife, 3 wks ago.       Curlene Dolphin (Daughter)      [phone]       PCP: [name]  Action/Plan: Transition to home with hospice care...pending hospice approval. Referral to [facility] made for  Home hospice care after choice given and selected per daughter,Levi Lewis.  Pt will need non emergent ambulance for transportation to home, [facility].  Expected Discharge Date:   11/15/2017            Expected Discharge Plan:  Home w Hospice Care  In-House Referral:  Clinical Social Work  Discharge planning Services  CM Consult  Post Acute Care Choice:    Choice offered to:  Adult Children  DME Arranged:    DME Agency:     HH Arranged:    HH Agency:  [facility]  Status of Service:  Completed, signed off  If discussed at [place] of Stay Meetings, dates discussed:    Additional Comments:  Sharin Mons, RN 11/15/2017, 2:03 PM

## 2017-11-15 NOTE — Progress Notes (Signed)
RN called this RT to notify that pt wanted to come off of the BIPAP stating he felt like he was smothering.

## 2017-11-15 NOTE — Discharge Summary (Signed)
Physician Discharge Summary  Levi Lewis ENI:[id number] DOB: 1917-12-28 DOA: 11/08/2017  PCP: [name], MD  Admit date: 11/08/2017 Discharge date: 11/15/2017  Admitted From: Home Disposition: Home with hospice   Recommendations for Outpatient Follow-up:  1. Close follow up by [facility].  Home Health: Per hospice Equipment/Devices: Hospital bed, condom catheter, 4L O2, nebulizer Discharge Condition: Stable for transport, guarded prognosis CODE STATUS: DNR Diet recommendation: As tolerated  Brief/Interim Summary: Levi Lewis is a 84 y.o. male with a history of chronic HFrEF, gout/arthritis on chronic prednisone, persistent AFib s/p BiV PPM and stage III CKD who presented to the ED about 3 weeks following the death of his wife of 72 years with right arm pain, swelling and low blood pressure associated with generalized weakness. WBC 14k, Cr up to 3.2 from baseline of 2, UA and CXR without acute infection. He was admitted for dehydration causing hypotension with some improvement after IV fluids and given ceftriaxone for right arm cellulitis. U/S ruled out DVT/SVT. He also required I/O cath for urinary retention which is resolved. Renal U/S showed cortical atrophy and no hydronephrosis. Cardiology was consulted and confirmed that the patient is not a candidate for advanced HF therapies, echocardiogram showing severely hypokinetic LV with EF 20-25% as well as biatral enlargement and valvular disease. He developed pulmonary edema briefly requiring BiPAP without further escalation due to DNR status. He fortunately responded to IV lasix and has also noted improved renal function. Palliative care has been consulted and met with patient and family. Goals of care discussions have been ongoing. The patient and family wish for the patient to return home with hospice. His condition has been optimized, though prognosis remains guarded.   Discharge Diagnoses:  Principal  Problem:   Hypotension Active Problems:   CAD (coronary artery disease)   Atrial fibrillation (HCC)   Pacemaker   Chronic systolic CHF (congestive heart failure) (HCC)   Acute on chronic renal failure (HCC)   Arm swelling right   AKI (acute kidney injury) ([place])   Palliative care encounter  Acute on chronic hypoxic respiratory failure, COPD, pulmonary edema: Developed severe respiratory distress 7/26 with flash pulmonary edema, slowly improving. DC zosyn (initial concern for aspiration but no fever, cough, or choking, mild aspiration risk per SLP evaluation). CXR has shown significant improvement in pulmonary edema and effusions. - Continue supplemental oxygen and prn breathing treatments. Neb ordered for home. If albuterol causes symptomatic tachycardia, would switch to xopenex. - Developed very transient respiratory distress last night and put on BiPAP but could only stand it for 5 minutes. I doubt this was due to an unknown pathology and that BiPAP or further inpatient management would make a significant positive impact on his condition. - to help with air hunger as well as pain due to cellulitis, abscess, and gout as below, will start po dilaudid (avoiding morphine for renal failure).   Hypotension: Possibly related to cellulitis with relative adrenal insufficiency vs. progressive heart failure/broken heart syndrome. No evidence of bleeding. Note low-normal BPs as outpatient. - Cardiology consulted in light of cardiac history, appreciate their recommendations, recommend holding beta blocker.  - Stress steroids provided and tapered in light of worsening pulmonary edema. Changed back to po prednisone 7/31.  Right arm cellulitis and right hand abscess and right olecranon bursitis: s/p I&D 7/29. No evidence of clot on U/S. Has h/o bursitis and has developed similar clinical findings. Leukocytosis resolved.  - Will narrow antibiotics based on culture data showing  MSSA to keflex - Follow up  with hospice provider vs. orthopedics in the next 1-2 weeks for wound recheck - Elevate right arm.  Acute on chronic systolic CHF: EF 76-19% stable this admission. Unable to determine diastolic function, though severe biatrial enlargement, mod-severe MR, mild AR, moderate TR with moderately increased PASP. No focal LV ballooning, etc. to definitively determine takotsubo.  - Cardiology confirms pt not candidate for advanced therapies. Holding beta blocker with hypotension, no ACE/ARB due to renal failure.  - Return to home torsemide 53m po daily per cardiology. Can continue condom catheter as needed for comfort.    AKI on stage IV CKD: Suspected to be due to intravascular volume depletion. Improved with IVF's. Possibly hemodynamically mediated with ongoing hypotension, as well as due to NSAID nephropathy (taking etodolac multiple times/day for right arm pain), possibly urinary retention as well (resolved) and ?cardiorenal. Renal U/S with cortical thinning suggestive of known CKD without hydronephrosis.  Discussed that not a HD candidate were renal function to decline. - Creatinine improved back to baseline ~2 though CrCl still below 337mmin.   Acute delirium: Hallucinations, disorientation waxing/waning worst at night. Counseled pt and family that this is difficult to treat at his age and we're most interested in mitigating complications/injury. Needs to get back to a familiar surroundings to definitively help. - Discussed delirium precautions/sleep-wake cycle with pt and family.  - Continue pt's home qHS triazolam and added zyprexa qHS if needed at home. - Tapered steroids.  Acute urinary retention due to BPH: Prostate enlargement (4.3cm) on U/S is stable. Resolved. - Will continue flomax  AFib, has BiV pacer for resynchronization therapy:  - Monitor HR, holding beta blocker with hypotension - Stopped eliquis with significant risk of bleeding and current renal failure. Will not restart  anticoagulation.    Grief reaction: Appropriate bereavement with wife of 6564ears passing earlier this month, currently lives alone.  - Pt's pastor has been by to see him several times.  - Still followed by [facility]  Acute gout flare of right knee: s/p arthrocentesis with MSU crystals noted.  - Continue febuxostat, chronic prednisone. Intraarticular injection at time of arthrocentesis 7/31.   Hypothyroidism: TSH is suppressed but T4 is normal.  - Continue synthroid  GERD:  - PPI  Discharge Instructions Discharge Instructions    Discharge instructions   Complete by:  As directed    You were evaluated for low blood pressure which is due to progressive heart failure. Your volume status has been optimized, though you remain short of breath despite improvement in the blood pressure. You are stable for discharge home with [facility] follow [facility]. Your kidney function is not sufficient to filter this and your risk of stroke in the short term is low.  - STOP taking coreg. This may allow the heart rate to elevate, but will help reduce the risk of low blood pressure.  - STOP taking etodolac as this caused significant damage to the kidneys. You were injected with steroids for gout in the right knee, and will continue on uloric and prednisone 1013maily.  - For the cellulitis: START taking keflex 250m77mree times daily for at least the next 7 days. The hospice team will need to follow the clinical course of the infection and change treatment as needed - START taking zyprexa at night to help with delirium. If delirium is not a problem at home like it is  here, feel free to stop taking this. You can continue using triazolam which is your previous home medication at bedtime. - For shortness of breath, you will get home oxygen and a nebulizer machine to use for giving albuterol as needed. If you remain short of breath or have  severe shortness of breath, you should also take dilaudid by mouth. This can also be used to treat uncontrolled pain.  - A hospital bed has also been ordered and should be arranged with hospice prior to your discharge. - If you have any unrelieved symptoms or questions, please call the hospice team for further guidance.   It was truly an honor taking care of you and meeting your wonderful family. I will not forget you or them. Thank you so much for your service to our country. God bless you. - Vance Gather, MD     Allergies as of 11/15/2017   No Known Allergies     Medication List    STOP taking these medications   carvedilol 6.25 MG tablet Commonly known as:  COREG   ELIQUIS 2.5 MG Tabs tablet Generic drug:  apixaban   etodolac 400 MG tablet Commonly known as:  LODINE     TAKE these medications   acetaminophen 325 MG tablet Commonly known as:  TYLENOL Take 650 mg by mouth every 6 (six) hours as needed for mild pain, fever or headache.   albuterol (2.5 MG/3ML) 0.083% nebulizer solution Commonly known as:  PROVENTIL Take 3 mLs (2.5 mg total) by nebulization every 4 (four) hours as needed for wheezing or shortness of breath.   cephALEXin 250 MG capsule Commonly known as:  KEFLEX Take 1 capsule (250 mg total) by mouth 3 (three) times daily.   fenofibrate 48 MG tablet Commonly known as:  TRICOR TAKE 1/2 TABLET BY MOUTH EVERY DAY   fish oil-omega-3 fatty acids 1000 MG capsule Take 2 g by mouth daily.   gabapentin 300 MG capsule Commonly known as:  NEURONTIN Take 300 mg by mouth 2 (two) times daily.   guaiFENesin 600 MG 12 hr tablet Commonly known as:  MUCINEX Take 600 mg by mouth 2 (two) times daily as needed for cough or to loosen phlegm (COLDS).   HYDROmorphone 2 MG tablet Commonly known as:  DILAUDID Take 0.5-1 tablets (1-2 mg total) by mouth every 4 (four) hours as needed for up to 3 days for moderate pain or severe pain (or severe/refractory shortness of breath).    KLOR-CON M20 20 MEQ tablet Generic drug:  potassium chloride SA TAKE 1 TABLET BY MOUTH EVERY DAY   levothyroxine 75 MCG tablet Commonly known as:  SYNTHROID, LEVOTHROID Take 1 tablet (75 mcg total) by mouth daily.   OCUVITE PO Take 2 tablets by mouth daily.   OLANZapine 2.5 MG tablet Commonly known as:  ZYPREXA Take 1 tablet (2.5 mg total) by mouth at bedtime.   omeprazole 20 MG capsule Commonly known as:  PRILOSEC Take 20 mg by mouth daily.   predniSONE 10 MG tablet Commonly known as:  DELTASONE Take 10 mg by mouth daily.   tamsulosin 0.4 MG Caps capsule Commonly known as:  FLOMAX Take 0.4 mg by mouth 2 (two) times daily.   torsemide 20 MG tablet Commonly known as:  DEMADEX Take 4 tablets (80 mg total) by mouth daily.   triazolam 0.25 MG tablet Commonly known as:  HALCION Take 0.5 tablets (0.125 mg total) by mouth at bedtime as needed for sleep.   ULORIC 40 MG tablet  Generic drug:  febuxostat Take 40 mg by mouth daily.   vitamin B-12 100 MCG tablet Commonly known as:  CYANOCOBALAMIN Take 100 mcg by mouth daily.   vitamin E 100 UNIT capsule Take 100 Units by mouth daily.            Durable Medical Equipment  (From admission, onward)        Start     Ordered   11/15/17 1500  For home use only DME oxygen  Once    Question Answer Comment  Mode or (Route) Nasal cannula   Liters per Minute 4   Frequency Continuous (stationary and portable oxygen unit needed)   Oxygen delivery system Gas      11/15/17 1459   11/15/17 1357  For home use only DME Hospital bed  Once    Question:  Bed type  Answer:  Semi-electric   11/15/17 1356   11/15/17 1357  For home use only DME Nebulizer/meds  Once    Question Answer Comment  Patient needs a nebulizer to treat with the following condition COPD (chronic obstructive pulmonary disease) ([place])   Patient needs a nebulizer to treat with the following condition Acute respiratory failure ([place])      11/15/17 1356      Follow-up Information    [facility] Follow up.   Contact information: [place] [address] [phone]       [name], MD Follow up.   Specialty:  Internal Medicine Contact information: [place] [address] [phone]        Jerline Pain, MD .   Specialty:  Cardiology Contact information: [phone] [address] [place] [address] [place] [address] [phone]          No Known Allergies  Consultations:  Cardiology  Orthopedics  Palliative care medicine team  Procedures/Studies: Dg Chest 2 View  Result Date: 11/08/2017 CLINICAL DATA:  Shortness of breath.  Hypotension. EXAM: CHEST - 2 VIEW COMPARISON:  03/16/2016 and 12/03/2015 FINDINGS: Chronic cardiomegaly. Triple lead pacemaker in place. Slight pulmonary vascular redistribution to the upper lobes without pulmonary edema. The lungs are hyperinflated with flattening of the diaphragm. No definitive effusions. No acute bone abnormality. IMPRESSION: 1. Chronic cardiomegaly. 2.  Emphysema (ICD10-J43.9). 3. Slight pulmonary vascular congestion. Electronically Signed   By: Lorriane Shire M.D.   On: 11/08/2017 14:03   US Renal  Result Date: 11/09/2017 CLINICAL DATA:  84 year old male with acute renal injury. EXAM: RENAL / URINARY TRACT ULTRASOUND COMPLETE COMPARISON:  CT Abdomen and Pelvis 02/19/2015. FINDINGS: Right Kidney: Length: 10.3 centimeters. Echogenic, atrophied right kidney (image 3). No right hydronephrosis or renal mass. Left Kidney: Length: 11.5 centimeters. Echogenic, atrophied left kidney (image 18) with no left hydronephrosis or renal mass. Bladder: Appears normal for degree of bladder distention. Prostate hypertrophy measuring 4.3 centimeters diameter appears stable since 2016. IMPRESSION: No acute findings. Chronic bilateral medical renal disease with renal cortical atrophy. Electronically Signed   By: Genevie Ann M.D.    On: 11/09/2017 16:08   Dg Chest Port 1 View  Result Date: 11/15/2017 CLINICAL DATA:  Respiratory distress. Cough. Congestive heart failure. Follow-up. EXAM: PORTABLE CHEST 1 VIEW COMPARISON:  11/14/2017 FINDINGS: Cardiomegaly as seen previously. Aortic atherosclerosis. Pacemaker leads appear the same. Radiographic improvement with less interstitial and alveolar edema. Some edema does persist. Small effusions left more than right with basilar atelectasis. IMPRESSION: Radiographic improvement in congestive heart failure pattern. Electronically Signed  By: Nelson Chimes M.D.   On: 11/15/2017 08:31   Dg Chest Port 1 View  Result Date: 11/14/2017 CLINICAL DATA:  84 year old male with pulmonary edema. Subsequent encounter. EXAM: PORTABLE CHEST 1 VIEW COMPARISON:  11/12/2017. FINDINGS: Worsening asymmetric pulmonary edema with bilateral pleural effusions superimposed upon chronic lung changes. Cardiomegaly with biventricular pacer in place. Calcified aorta. No acute osseous abnormality. IMPRESSION: Worsening asymmetric pulmonary edema with bilateral pleural effusions superimposed upon chronic lung changes. Cardiomegaly with biventricular pacer in place. Aortic Atherosclerosis (ICD10-I70.0). Electronically Signed   By: Genia Del M.D.   On: 11/14/2017 09:24   Dg Chest Port 1 View  Result Date: 11/12/2017 CLINICAL DATA:  Shortness of breath EXAM: PORTABLE CHEST 1 VIEW COMPARISON:  11/11/2017 FINDINGS: Cardiac shadow is prominent. Pacing device is again seen. Persistent left basilar atelectasis with mild effusion is noted. Small effusion is noted on the right. No new focal infiltrate is seen. IMPRESSION: Slight improved aeration is noted particularly in the right lung base. No new focal abnormality is noted. Electronically Signed   By: Inez Catalina M.D.   On: 11/12/2017 08:41   Dg Chest Port 1 View  Result Date: 11/11/2017 CLINICAL DATA:  Follow-up pulmonary edema and acute respiratory failure EXAM:  PORTABLE CHEST 1 VIEW COMPARISON:  11/10/2017 FINDINGS: Cardiac shadow remains enlarged. Pacing device is again seen. Vascular congestion is noted although the degree of interstitial edema has improved slightly in the interval from the prior exam. Bibasilar atelectasis and effusions are again identified. IMPRESSION: Site improvement in the degree of vascular congestion and edema. Electronically Signed   By: Inez Catalina M.D.   On: 11/11/2017 09:21   Dg Chest Port 1 View  Result Date: 11/10/2017 CLINICAL DATA:  Acute respiratory failure. EXAM: PORTABLE CHEST 1 VIEW COMPARISON:  Two-view chest x-ray 11/08/2017. FINDINGS: Heart is enlarged. Pacemaker is stable. Diffuse interstitial edema has increased. Bilateral pleural effusions are present. Bibasilar airspace disease is noted. IMPRESSION: 1. Cardiomegaly with new superimposed interstitial edema and bilateral pleural effusions compatible with congestive heart failure. 2. Bibasilar airspace disease likely reflects atelectasis. Electronically Signed   By: San Morelle M.D.   On: 11/10/2017 15:45    Echocardiogram   Subjective: Had transient respiratory distress last night, did not tolerate bipap at all and felt better after a breathing treatment. No chest pain, feels better this morning. Slept most of yesterday so was awake much of last night. Hasn't appeared as delirious since starting zyprexa.  Discharge Exam: Vitals:   11/15/17 0630 11/15/17 1434  BP: (!) 83/48 106/63  Pulse: 79 (!) 122  Resp: 18 18  Temp:    SpO2: 97% 92%   General: Elderly male in no distress Cardiovascular: Rapid irreg, rate ~100bpm. No m/r/g, JVD. Trace LE edema Respiratory: Nonlabored with normal pattern on supplemental oxygen. Decreased at bases but no crackles or wheezes. Abdominal: Soft, NT, ND, bowel sounds + Extremities: Right knee with tender effusion, no erythema. Right elbow with bursitis, distal arm erythematous with improved edema. Dorsum of hand s/p  I&D with c/d/i dressing, no packing.   Labs: BNP (last 3 results) No results for input(s): BNP in the last 8760 hours. Basic Metabolic Panel: Recent Labs  Lab 11/11/17 0313 11/12/17 0653 11/13/17 0537 11/14/17 0526 11/15/17 0344  NA 141 141 145 138 139  K 4.2 3.2* 3.8 3.4* 3.5  CL 110 105 106 102 99  CO2 19* 23 29 26 27   GLUCOSE 127* 103* 107* 96 98  BUN 52* 47* 39* 39*  41*  CREATININE 2.85* 2.46* 2.18* 2.00* 2.09*  CALCIUM 8.5* 8.1* 8.3* 8.1* 8.1*   Liver Function Tests: No results for input(s): AST, ALT, ALKPHOS, BILITOT, PROT, ALBUMIN in the last 168 hours. No results for input(s): LIPASE, AMYLASE in the last 168 hours. No results for input(s): AMMONIA in the last 168 hours. CBC: Recent Labs  Lab 11/09/17 0002 11/10/17 0451 11/11/17 0313 11/13/17 0537  WBC 12.6* 14.5* 12.5* 9.6  HGB 12.0* 10.5* 11.0* 11.1*  HCT 37.7* 32.6* 34.5* 34.6*  MCV 100.8* 100.0 99.4 98.9  PLT 212 224 248 236   Cardiac Enzymes: No results for input(s): CKTOTAL, CKMB, CKMBINDEX, TROPONINI in the last 168 hours. BNP: Invalid input(s): POCBNP CBG: No results for input(s): GLUCAP in the last 168 hours. D-Dimer No results for input(s): DDIMER in the last 72 hours. Hgb A1c No results for input(s): HGBA1C in the last 72 hours. Lipid Profile No results for input(s): CHOL, HDL, LDLCALC, TRIG, CHOLHDL, LDLDIRECT in the last 72 hours. Thyroid function studies No results for input(s): TSH, T4TOTAL, T3FREE, THYROIDAB in the last 72 hours.  Invalid input(s): FREET3 Anemia work up No results for input(s): VITAMINB12, FOLATE, FERRITIN, TIBC, IRON, RETICCTPCT in the last 72 hours. Urinalysis    Component Value Date/Time   COLORURINE YELLOW 11/08/2017 1646   APPEARANCEUR CLEAR 11/08/2017 1646   LABSPEC 1.006 11/08/2017 1646   PHURINE 5.0 11/08/2017 1646   GLUCOSEU NEGATIVE 11/08/2017 1646   HGBUR NEGATIVE 11/08/2017 1646   BILIRUBINUR NEGATIVE 11/08/2017 1646   KETONESUR NEGATIVE 11/08/2017  1646   PROTEINUR NEGATIVE 11/08/2017 1646   UROBILINOGEN 1.0 02/19/2015 1815   NITRITE NEGATIVE 11/08/2017 1646   LEUKOCYTESUR NEGATIVE 11/08/2017 1646    Microbiology Recent Results (from the past 240 hour(s))  MRSA PCR Screening     Status: None   Collection Time: 11/10/17  4:00 PM  Result Value Ref Range Status   MRSA by PCR NEGATIVE NEGATIVE Final    Comment:        The GeneXpert MRSA Assay (FDA approved for NASAL specimens only), is one component of a comprehensive MRSA colonization surveillance program. It is not intended to diagnose MRSA infection nor to guide or monitor treatment for MRSA infections. Performed at [facility], [place] [address] [place], [place] [address]   Aerobic Culture (superficial specimen)     Status: None   Collection Time: 11/13/17  1:29 PM  Result Value Ref Range Status   Specimen Description WOUND RIGHT HAND  Final   Special Requests NONE  Final   Gram Stain   Final    ABUNDANT WBC PRESENT,BOTH PMN AND MONONUCLEAR FEW GRAM POSITIVE COCCI IN PAIRS Performed at [facility], [address] [place], [place] [address]    Culture FEW STAPHYLOCOCCUS AUREUS  Final   Report Status 11/15/2017 FINAL  Final   Organism ID, Bacteria STAPHYLOCOCCUS AUREUS  Final      Susceptibility   Staphylococcus aureus - MIC*    CIPROFLOXACIN <=0.5 SENSITIVE Sensitive     ERYTHROMYCIN <=0.25 SENSITIVE Sensitive     GENTAMICIN <=0.5 SENSITIVE Sensitive     OXACILLIN 0.5 SENSITIVE Sensitive     TETRACYCLINE <=1 SENSITIVE Sensitive     VANCOMYCIN <=0.5 SENSITIVE Sensitive     TRIMETH/SULFA <=10 SENSITIVE Sensitive     CLINDAMYCIN <=0.25 SENSITIVE Sensitive     RIFAMPIN <=0.5 SENSITIVE Sensitive     Inducible Clindamycin NEGATIVE Sensitive     * FEW STAPHYLOCOCCUS AUREUS  Body fluid culture  Status: None (Preliminary result)   Collection Time: 11/15/17 12:30 PM  Result Value Ref Range Status   Specimen Description FLUID KNEE RIGHT  Final    Special Requests NONE  Final   Gram Stain   Final    ABUNDANT WBC PRESENT, PREDOMINANTLY PMN NO ORGANISMS SEEN Performed at [facility], [place] [address], [place], [place] [address]    Culture PENDING  Incomplete   Report Status PENDING  Incomplete    Time coordinating discharge: Approximately 40 minutes  Patrecia Pour, MD  [facility] 11/15/2017, 3:18 PM Pager [phone]

## 2017-11-16 ENCOUNTER — Telehealth: Payer: Self-pay

## 2017-11-16 NOTE — Telephone Encounter (Signed)
Confirmed remote transmission w/ pt son.    

## 2017-11-18 LAB — BODY FLUID CULTURE: Culture: NO GROWTH

## 2017-12-07 ENCOUNTER — Telehealth: Payer: Self-pay

## 2017-12-07 NOTE — Telephone Encounter (Signed)
ICM call to son, Destine Ambroise.  He explained patient is now in hospice and growing weaker every day.  He is unable to get out of bed.  Advised will stop monthly ICM follow up for fluid management. Advised to call if he has any questions or concerns.

## 2017-12-17 DEATH — deceased

## 2018-01-02 NOTE — Telephone Encounter (Signed)
Called patient's son about ordering a return kit. Phone number for [employer] provided for patient's son. Son verbalized understanding.

## 2018-01-17 ENCOUNTER — Ambulatory Visit: Payer: Medicare Other | Admitting: Nurse Practitioner

## 2018-04-03 ENCOUNTER — Ambulatory Visit: Payer: Medicare Other | Admitting: Cardiology
